# Patient Record
Sex: Male | Born: 1970
Health system: Southern US, Community
[De-identification: ages and names within clinical notes are randomized; demographics above are authoritative.]

## PROBLEM LIST (undated history)

## (undated) DIAGNOSIS — M25561 Pain in right knee: Secondary | ICD-10-CM

## (undated) DIAGNOSIS — Z973 Presence of spectacles and contact lenses: Secondary | ICD-10-CM

## (undated) DIAGNOSIS — R350 Frequency of micturition: Secondary | ICD-10-CM

## (undated) DIAGNOSIS — T7840XA Allergy, unspecified, initial encounter: Secondary | ICD-10-CM

## (undated) DIAGNOSIS — M25569 Pain in unspecified knee: Secondary | ICD-10-CM

## (undated) DIAGNOSIS — F32A Depression, unspecified: Secondary | ICD-10-CM

## (undated) DIAGNOSIS — L503 Dermatographic urticaria: Secondary | ICD-10-CM

## (undated) DIAGNOSIS — M109 Gout, unspecified: Secondary | ICD-10-CM

## (undated) DIAGNOSIS — F439 Reaction to severe stress, unspecified: Secondary | ICD-10-CM

## (undated) DIAGNOSIS — L853 Xerosis cutis: Secondary | ICD-10-CM

## (undated) DIAGNOSIS — F419 Anxiety disorder, unspecified: Secondary | ICD-10-CM

## (undated) DIAGNOSIS — G8929 Other chronic pain: Secondary | ICD-10-CM

## (undated) DIAGNOSIS — R5383 Other fatigue: Secondary | ICD-10-CM

## (undated) DIAGNOSIS — M25562 Pain in left knee: Secondary | ICD-10-CM

## (undated) DIAGNOSIS — R7303 Prediabetes: Secondary | ICD-10-CM

## (undated) DIAGNOSIS — G4733 Obstructive sleep apnea (adult) (pediatric): Secondary | ICD-10-CM

## (undated) DIAGNOSIS — F329 Major depressive disorder, single episode, unspecified: Secondary | ICD-10-CM

## (undated) DIAGNOSIS — R519 Headache, unspecified: Secondary | ICD-10-CM

## (undated) DIAGNOSIS — G4730 Sleep apnea, unspecified: Secondary | ICD-10-CM

## (undated) DIAGNOSIS — E785 Hyperlipidemia, unspecified: Secondary | ICD-10-CM

## (undated) DIAGNOSIS — I10 Essential (primary) hypertension: Secondary | ICD-10-CM

## (undated) DIAGNOSIS — E669 Obesity, unspecified: Secondary | ICD-10-CM

## (undated) DIAGNOSIS — M549 Dorsalgia, unspecified: Secondary | ICD-10-CM

## (undated) DIAGNOSIS — R51 Headache: Secondary | ICD-10-CM

## (undated) DIAGNOSIS — M2550 Pain in unspecified joint: Secondary | ICD-10-CM

## (undated) DIAGNOSIS — I959 Hypotension, unspecified: Secondary | ICD-10-CM

## (undated) HISTORY — DX: Obesity, unspecified: E66.9

## (undated) HISTORY — DX: Pain in unspecified knee: M25.569

## (undated) HISTORY — DX: Prediabetes: R73.03

## (undated) HISTORY — DX: Frequency of micturition: R35.0

## (undated) HISTORY — DX: Other chronic pain: G89.29

## (undated) HISTORY — DX: Pain in left knee: M25.562

## (undated) HISTORY — DX: Sleep apnea, unspecified: G47.30

## (undated) HISTORY — DX: Depression, unspecified: F32.A

## (undated) HISTORY — DX: Allergy, unspecified, initial encounter: T78.40XA

## (undated) HISTORY — DX: Dermatographic urticaria: L50.3

## (undated) HISTORY — DX: Headache, unspecified: R51.9

## (undated) HISTORY — DX: Presence of spectacles and contact lenses: Z97.3

## (undated) HISTORY — DX: Pain in unspecified joint: M25.50

## (undated) HISTORY — DX: Dorsalgia, unspecified: M54.9

## (undated) HISTORY — DX: Headache: R51

## (undated) HISTORY — DX: Xerosis cutis: L85.3

## (undated) HISTORY — DX: Reaction to severe stress, unspecified: F43.9

## (undated) HISTORY — DX: Essential (primary) hypertension: I10

## (undated) HISTORY — DX: Anxiety disorder, unspecified: F41.9

## (undated) HISTORY — DX: Gout, unspecified: M10.9

## (undated) HISTORY — DX: Hyperlipidemia, unspecified: E78.5

## (undated) HISTORY — DX: Other fatigue: R53.83

## (undated) HISTORY — DX: Major depressive disorder, single episode, unspecified: F32.9

## (undated) HISTORY — DX: Pain in right knee: M25.561

---

## 2006-11-12 HISTORY — PX: KNEE SURGERY: SHX244

## 2014-03-16 ENCOUNTER — Encounter: Payer: Self-pay | Admitting: Medical

## 2014-03-16 ENCOUNTER — Ambulatory Visit (INDEPENDENT_AMBULATORY_CARE_PROVIDER_SITE_OTHER): Payer: 59 | Admitting: Medical

## 2014-03-16 VITALS — BP 120/78 | HR 88 | Temp 98.4°F | Resp 15 | Ht 68.5 in | Wt 333.0 lb

## 2014-03-16 DIAGNOSIS — I10 Essential (primary) hypertension: Secondary | ICD-10-CM

## 2014-03-16 DIAGNOSIS — F418 Other specified anxiety disorders: Secondary | ICD-10-CM

## 2014-03-16 DIAGNOSIS — J029 Acute pharyngitis, unspecified: Secondary | ICD-10-CM

## 2014-03-16 DIAGNOSIS — F341 Dysthymic disorder: Secondary | ICD-10-CM

## 2014-03-16 DIAGNOSIS — E669 Obesity, unspecified: Secondary | ICD-10-CM

## 2014-03-16 MED ORDER — FLUOXETINE HCL 20 MG PO CAPS: 20.0000 mg | ORAL_CAPSULE | Freq: Every day | ORAL | Status: DC

## 2014-03-16 MED ORDER — METOPROLOL SUCCINATE ER 50 MG PO TB24: 50.0000 mg | ORAL_TABLET | Freq: Every day | ORAL | Status: DC

## 2014-03-16 NOTE — Progress Notes (Signed)
Subjective: Here as a new patient today.    Went to urgent care about a week ago for sore throat.   Had strep test, provider felt strongly he was +, started him on zpak.  Right side of throat was really swollen, had bad neck pain and ear pain.  Almost went to the ED.  Yesterday was first day he went to work since Thursday.  Currently today is first day he felt much better.  Feels like things are improved in the right direction.    Wants to change primary care providers.  Was primary seeing urgent care prior.  Was advised to get a PCM.   Been in [place] for about a year.   Last PCM 5 years ago.    HTN - compliant with Toprol XL daily.   Diagnosed with HTN around 2007.  Obesity - was up to 450lb at one point.  Has lost a lot of weight over the years.   Gained some wight back with move to [place] and stressful job.  Current weight is 333lb.  Has membership to [employer] but not getting to use it often.   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   Was on fluoxetine in the past for depression and anxiety, it did well in the past.   Had used Wellbutrin in the past.   Would like to go back on fluoxetine.  Tends to stress eat.  Has hard time staying motivated to go to the gym.   No SI/HI.   Objective: BP 120/78  Pulse 88  Temp(Src) 98.4 F (36.9 C) (Oral)  Resp 15  Ht 5' 8.5" (1.74 m)  Wt 333 lb (151.048 kg)  BMI 49.89 kg/m2  General appearance: alert, no distress, WD/WN, obese white male HEENT: normocephalic, sclerae anicteric, TMs pearly, nares patent, no discharge or erythema, pharynx normal Oral cavity: MMM, no lesions Neck: supple, no lymphadenopathy, no thyromegaly, no masses Heart: RRR, normal S1, S2, no murmurs Lungs: CTA bilaterally, no wheezes, rhonchi, or rales Abdomen: +bs, soft, non tender, non distended, no masses, no hepatomegaly, no splenomegaly Pulses: 2+  symmetric, upper and lower extremities, normal cap refill Ext: no edema  Assessment: Encounter Diagnoses  Name Primary?  . Essential hypertension, benign Yes  . Obesity, unspecified   . Depression with anxiety   . Sore throat    Plan: HTN - refilled medication, BP controlled.   Recheck 15mo for physical, fasting.  Obesity - discussed goal of getting to the gym in the mornings daily to start the day.   Try and go every day.  Try to cut calories by at least 500 calories per day.  Work on diet changes as discussed.  recheck 15mo.  Depression - begin back on Fluoxetine.   Discussed his concerns, work stress, ways to deal with stress and anxiety.   discussed risks/benefits of mediation.  recheck 15mo, sooner prn.  Sore throat - mostly resolved, much improved.  C/t supportive care.

## 2014-03-23 ENCOUNTER — Telehealth: Payer: Self-pay | Admitting: Medical

## 2014-03-23 NOTE — Telephone Encounter (Signed)
Yeah, unfortunately, I would need some prior documentation, and we can discussed at next visit.  pls have him sign to get prior eval records, xrays, etc. Regarding the limitations/joint problem

## 2014-03-23 NOTE — Telephone Encounter (Signed)
Pt called wanting to know if Vincenza Hews would be able to write a work note stating that he could continue to wear sneakers at work. PT says he had orthoscopic knee surgery many years ago and has been wearing sneakers at work because he can not tolerate dress shoes. Job requiring a note now. Pt has only seen Vincenza Hews once for an unrelated acute issue and Vincenza Hews does not have his previous records to support the knee issue so pt says he will get previous records sent here regarding his knee issues and then wants to see if Vincenza Hews to write note for work.

## 2014-03-30 NOTE — Telephone Encounter (Signed)
Just notice not on schedule. I will have pt come in to sign documentation.

## 2014-03-30 NOTE — Telephone Encounter (Signed)
Per tammy pt will get records and bring them in.

## 2014-03-30 NOTE — Telephone Encounter (Signed)
Pt is coming in tomorrow for labs. I will have him sign a records release while here.

## 2014-04-04 ENCOUNTER — Telehealth: Payer: Self-pay | Admitting: Medical

## 2014-04-04 NOTE — Telephone Encounter (Signed)
Records received from previous physician. Sending back for review.

## 2014-04-10 NOTE — Telephone Encounter (Signed)
Records was sorted. 

## 2014-04-20 ENCOUNTER — Ambulatory Visit (INDEPENDENT_AMBULATORY_CARE_PROVIDER_SITE_OTHER): Payer: 59 | Admitting: Medical

## 2014-04-20 ENCOUNTER — Encounter: Payer: Self-pay | Admitting: Medical

## 2014-04-20 VITALS — BP 126/86 | HR 88 | Temp 99.0°F | Resp 16 | Ht 69.0 in | Wt 335.0 lb

## 2014-04-20 DIAGNOSIS — M1000 Idiopathic gout, unspecified site: Secondary | ICD-10-CM | POA: Insufficient documentation

## 2014-04-20 DIAGNOSIS — Z0000 Encounter for general adult medical examination without abnormal findings: Secondary | ICD-10-CM

## 2014-04-20 DIAGNOSIS — F32A Depression, unspecified: Secondary | ICD-10-CM | POA: Insufficient documentation

## 2014-04-20 DIAGNOSIS — F419 Anxiety disorder, unspecified: Secondary | ICD-10-CM

## 2014-04-20 DIAGNOSIS — Z23 Encounter for immunization: Secondary | ICD-10-CM

## 2014-04-20 DIAGNOSIS — E669 Obesity, unspecified: Secondary | ICD-10-CM | POA: Insufficient documentation

## 2014-04-20 DIAGNOSIS — I10 Essential (primary) hypertension: Secondary | ICD-10-CM | POA: Insufficient documentation

## 2014-04-20 DIAGNOSIS — F418 Other specified anxiety disorders: Secondary | ICD-10-CM

## 2014-04-20 DIAGNOSIS — F329 Major depressive disorder, single episode, unspecified: Secondary | ICD-10-CM | POA: Insufficient documentation

## 2014-04-20 DIAGNOSIS — M79672 Pain in left foot: Secondary | ICD-10-CM

## 2014-04-20 LAB — COMPREHENSIVE METABOLIC PANEL
ALBUMIN: 4.1 g/dL (ref 3.5–5.2)
ALT: 27 U/L (ref 0–53)
AST: 29 U/L (ref 0–37)
Alkaline Phosphatase: 53 U/L (ref 39–117)
BUN: 17 mg/dL (ref 6–23)
CHLORIDE: 105 meq/L (ref 96–112)
CO2: 27 meq/L (ref 19–32)
CREATININE: 0.67 mg/dL (ref 0.50–1.35)
Calcium: 9 mg/dL (ref 8.4–10.5)
Glucose, Bld: 83 mg/dL (ref 70–99)
POTASSIUM: 4.1 meq/L (ref 3.5–5.3)
Sodium: 139 mEq/L (ref 135–145)
Total Bilirubin: 0.7 mg/dL (ref 0.2–1.2)
Total Protein: 6.6 g/dL (ref 6.0–8.3)

## 2014-04-20 LAB — POCT URINALYSIS DIPSTICK
BILIRUBIN UA: NEGATIVE
Blood, UA: NEGATIVE
Glucose, UA: NEGATIVE
Ketones, UA: NEGATIVE
LEUKOCYTES UA: NEGATIVE
Nitrite, UA: NEGATIVE
Protein, UA: NEGATIVE
SPEC GRAV UA: 1.015
Urobilinogen, UA: NEGATIVE
pH, UA: 7

## 2014-04-20 LAB — LIPID PANEL
CHOL/HDL RATIO: 3.2 ratio
CHOLESTEROL: 130 mg/dL (ref 0–200)
HDL: 41 mg/dL (ref >39)
LDL Cholesterol: 76 mg/dL (ref 0–99)
Triglycerides: 66 mg/dL (ref <150)
VLDL: 13 mg/dL (ref 0–40)

## 2014-04-20 LAB — CBC
HCT: 42.5 % (ref 39.0–52.0)
Hemoglobin: 14.8 g/dL (ref 13.0–17.0)
MCH: 30.3 pg (ref 26.0–34.0)
MCHC: 34.8 g/dL (ref 30.0–36.0)
MCV: 87.1 fL (ref 78.0–100.0)
Platelets: 149 10*3/uL — ABNORMAL LOW (ref 150–400)
RBC: 4.88 MIL/uL (ref 4.22–5.81)
RDW: 14.1 % (ref 11.5–15.5)
WBC: 4.5 10*3/uL (ref 4.0–10.5)

## 2014-04-20 LAB — URIC ACID: URIC ACID, SERUM: 6.7 mg/dL (ref 4.0–7.8)

## 2014-04-20 LAB — TSH: TSH: 2.28 u[IU]/mL (ref 0.350–4.500)

## 2014-04-20 MED ORDER — ALPRAZOLAM 0.5 MG PO TABS: 0.5000 mg | ORAL_TABLET | Freq: Every evening | ORAL | Status: DC | PRN

## 2014-04-20 MED ORDER — METOPROLOL SUCCINATE ER 50 MG PO TB24: 50.0000 mg | ORAL_TABLET | Freq: Every day | ORAL | Status: DC

## 2014-04-20 MED ORDER — FLUOXETINE HCL 40 MG PO CAPS: 40.0000 mg | ORAL_CAPSULE | Freq: Every day | ORAL | Status: DC

## 2014-04-20 NOTE — Addendum Note (Signed)
Addended by: Jac CanavanYSINGER, Nealy Karapetian S on: 04/20/2014 10:46 PM   Modules accepted: Orders, Medications

## 2014-04-20 NOTE — Progress Notes (Signed)
Subjective:   HPI  Patrick Baxter is a 43 y.o. male who presents for a complete physical.   Preventative care:3 years ago Last ophthalmology visit: 2011 in another city Last dental visit: 2011 in another city Last colonoscopy:no Last prostate exam: 3 years Last EKG:2012 [facility] Last labs:fasting today  Prior vaccinations: TD or Tdap within 10 years Influenza:no Pneumococcal:no Shingles/Zostavax:no  Advanced directive:no Health care power of attorney:no Living will:  Concerns: Anxiety and depression - improved since last visit but wants to bump up dose a little on prozac.   Would like refill of prn occasional xanax for worse anxiety.    Obesity - is working on losing weight.  Having some trouble getting motivated.  Having some heel pain, left.    Has questions about HIV prophylactic drug.  Has male sexual preference, uses condoms, not currently sexually active.   Sees counselor through [facility], gets general counseling there.   Reviewed their medical, surgical, family, social, medication, and allergy history and updated chart as appropriate.  Past Medical History  Diagnosis Date  . Hypertension   . Obesity   . Wears glasses   . Depression   . Anxiety   . Bilateral chronic knee pain   . Gout     Past Surgical History  Procedure Laterality Date  . Knee surgery  11/2006    right knee arthroscopy and meniscal repair, Dr. Conni Elliot    History   Social History  . Marital Status: Unknown    Spouse Name: N/A    Number of Children: N/A  . Years of Education: N/A   Occupational History  . Not on file.   Social History Main Topics  . Smoking status: Former Games developer  . Smokeless tobacco: Not on file     Comment: socially prior, not daily smoker  . Alcohol Use: 1.8 oz/week    3 Cans of beer per week  . Drug Use: No  . Sexual Activity: Not on file   Other Topics Concern  . Not on file   Social History Narrative   Single, works for [employer].  Exercise - not  much as of 04/2014.  No significant other.      Family History  Problem Relation Age of Onset  . Alzheimer's disease Mother   . Heart disease Mother     51, angioplasty, CAD  . Cancer Mother     breast  . Stroke Mother   . Hypertension Mother   . Heart disease Father     angina  . Arthritis Father   . Cancer Maternal Grandmother     leukemia  . Diabetes Paternal Grandmother     Current outpatient prescriptions:FLUoxetine (PROZAC) 20 MG capsule, Take 1 capsule (20 mg total) by mouth daily., Disp: 30 capsule, Rfl: 2;  metoprolol succinate (TOPROL-XL) 50 MG 24 hr tablet, Take 1 tablet (50 mg total) by mouth daily. Take with or immediately following a meal., Disp: 30 tablet, Rfl: 2;  ALPRAZolam (XANAX) 0.5 MG tablet, Take 1 tablet (0.5 mg total) by mouth at bedtime as needed for anxiety., Disp: 30 tablet, Rfl: 2  No Known Allergies     Review of Systems Constitutional: -fever, -chills, -sweats, -unexpected weight change, -decreased appetite, -fatigue Allergy: +sneezing, -itching, -congestion Dermatology: -changing moles, +rash, -lumps ENT: -runny nose, -ear pain, -sore throat, -hoarseness, -sinus pain, -teeth pain, - ringing in ears, -hearing loss, -nosebleeds Cardiology: -chest pain, -palpitations, -swelling, -difficulty breathing when lying flat, -waking up short of breath Respiratory: -cough, -shortness  of breath, -difficulty breathing with exercise or exertion, -wheezing, -coughing up blood Gastroenterology: -abdominal pain, -nausea, -vomiting, +diarrhea, -constipation, -blood in stool, -changes in bowel movement, -difficulty swallowing or eating Hematology: -bleeding, -bruising  Musculoskeletal: +joint aches, -muscle aches, +joint swelling, +back pain, -neck pain, -cramping, -changes in gait Ophthalmology: denies vision changes, eye redness, itching, discharge Urology: -burning with urination, -difficulty urinating, -blood in urine, -urinary frequency, -urgency,  -incontinence Neurology: -headache, -weakness, -tingling, -numbness, -memory loss, -falls, -dizziness Psychology: -depressed mood, -agitation, +sleep problems     Objective:   Physical Exam  BP 126/86  Pulse 88  Temp(Src) 99 F (37.2 C) (Oral)  Resp 16  Ht 5\' 9"  (1.753 m)  Wt 335 lb (151.955 kg)  BMI 49.45 kg/m2  General appearance: alert, no distress, WD/WN, obese white male Skin: several amorphic flat yellowish colored patches on back upper and mid back HEENT: normocephalic, conjunctiva/corneas normal, sclerae anicteric, PERRLA, EOMi, nares patent, no discharge or erythema, pharynx normal Oral cavity: MMM, tongue normal, teeth in good repair Neck: supple, no lymphadenopathy, no thyromegaly, no masses, normal ROM, no bruits Chest: non tender, normal shape and expansion Heart: RRR, normal S1, S2, no murmurs Lungs: CTA bilaterally, no wheezes, rhonchi, or rales Abdomen: +bs, soft, non tender, non distended, no masses, no hepatomegaly, no splenomegaly, no bruits Back: non tender, normal ROM, no scoliosis Musculoskeletal: anterior right knee arthroscopic surgical scars, otherwise upper extremities non tender, no obvious deformity, normal ROM throughout, lower extremities non tender, no obvious deformity, normal ROM throughout Extremities: no edema, no cyanosis, no clubbing Pulses: 2+ symmetric, upper and lower extremities, normal cap refill Neurological: alert, oriented x 3, CN2-12 intact, strength normal upper extremities and lower extremities, sensation normal throughout, DTRs 2+ throughout, no cerebellar signs, gait normal Psychiatric: normal affect, behavior normal, pleasant  GU: normal male external genitalia, circumcised, right testes seems to be somewhat retractable, but technically descended, otherwise  nontender, no masses, no hernia, no lymphadenopathy Rectal: deferred   Assessment and Plan :    Encounter Diagnoses  Name Primary?  . Encounter for health maintenance  examination in adult Yes  . Anxiety and depression   . Essential hypertension   . Obesity   . Need for prophylactic vaccination and inoculation against influenza   . Idiopathic gout, unspecified chronicity, unspecified site   . Heel pain, left     Physical exam - discussed healthy lifestyle, diet, exercise, preventative care, vaccinations, and addressed their concerns.    Anxiety and depression-increase to Prozac 40 mg daily, Xanax when necessary, continue counseling  Hypertension-continue same medication  Obesity discussed the need to work on lifestyle changes and weight loss  Counseled on the influenza virus vaccine.  Vaccine information sheet given.  Influenza vaccine given after consent obtained.  Gout - labs today  Heel pain - begin heel cups OTC   Follow-up pending labs

## 2014-04-21 LAB — HEMOGLOBIN A1C
Hgb A1c MFr Bld: 5.1 % (ref <5.7)
Mean Plasma Glucose: 100 mg/dL (ref <117)

## 2014-05-15 ENCOUNTER — Ambulatory Visit (INDEPENDENT_AMBULATORY_CARE_PROVIDER_SITE_OTHER): Payer: 59 | Admitting: Family Medicine

## 2014-05-15 ENCOUNTER — Encounter: Payer: Self-pay | Admitting: Family Medicine

## 2014-05-15 VITALS — BP 142/100 | HR 76 | Temp 98.1°F | Ht 69.0 in | Wt 340.0 lb

## 2014-05-15 DIAGNOSIS — I10 Essential (primary) hypertension: Secondary | ICD-10-CM

## 2014-05-15 DIAGNOSIS — J029 Acute pharyngitis, unspecified: Secondary | ICD-10-CM

## 2014-05-15 DIAGNOSIS — J069 Acute upper respiratory infection, unspecified: Secondary | ICD-10-CM

## 2014-05-15 DIAGNOSIS — B349 Viral infection, unspecified: Secondary | ICD-10-CM

## 2014-05-15 DIAGNOSIS — K120 Recurrent oral aphthae: Secondary | ICD-10-CM

## 2014-05-15 LAB — POCT RAPID STREP A (OFFICE): Rapid Strep A Screen: NEGATIVE

## 2014-05-15 NOTE — Patient Instructions (Signed)
Continue to use tylenol and/or ibuprofren OR Aleve as needed for pain, fevers. Avoid decongestants. Use Coricidin HBP instead (as this will not raise your blood pressure).  Drink plenty of fluids. Use Guaifenesin (found in Mucinex)--either plain, or DM versions if a cough suppressant is also needed. Consider sinus rinses (rinse kit or neti-pot).  Return if ongoing fevers, discolored mucus, worsening cough, shortness of breath, or other new symptoms develop.

## 2014-05-15 NOTE — Progress Notes (Signed)
Chief Complaint  Patient presents with  . Sore Throat    that started Mon/Tues of last week. Started feeling drained by Wed. Started feeling worse by Saturday. Ears are stopped up. Has fever blisters inside bottom lip. Chills and hot flashes that are alternating.    Symptoms began 6-7 days ago with sore throat.  A couple of days later he stayed home from work, and over the past weekend he had increasing congestion, cough, myalgias.  2 days ago, while at work, symptoms progressed--worsening congestion, cough, felt chilled, alternating with fevers (subjective).  Nasal mucus is mostly clear; +PND, which is also clear/white.  Cough is partly dry, and partly productive (white).  He denies any sinus pain/pressure, but some pain in his teeth.  He has pressure in both ears, with plugging and popping, R>L.   He developed painful blisters in his mouth 2-3 days ago.  Denies any known sick contacts. He has already gotten his flu shot this year.  PMH, PSH, SH reviewed.  Outpatient Encounter Prescriptions as of 05/15/2014  Medication Sig Note  . ALPRAZolam (XANAX) 0.5 MG tablet Take 1 tablet (0.5 mg total) by mouth at bedtime as needed for anxiety.   . fexofenadine (ALLEGRA) 180 MG tablet Take 180 mg by mouth daily.   Marland Kitchen FLUoxetine (PROZAC) 40 MG capsule Take 1 capsule (40 mg total) by mouth daily.   . metoprolol succinate (TOPROL-XL) 50 MG 24 hr tablet Take 1 tablet (50 mg total) by mouth daily. Take with or immediately following a meal.   . phenylephrine (SUDAFED PE) 10 MG TABS tablet Take 10 mg by mouth every 4 (four) hours as needed. 05/15/2014: Last dose was yesterday  . Pseudoeph-Doxylamine-DM-APAP (NYQUIL PO) Take 30 mLs by mouth at bedtime.   Marland Kitchen ibuprofen (ADVIL,MOTRIN) 200 MG tablet Take 400 mg by mouth every 6 (six) hours as needed. 05/15/2014: Taking as needed for headaches, generalized achiness (none today)  . naproxen sodium (ANAPROX) 220 MG tablet Take 220 mg by mouth 2 (two) times daily with a  meal. 05/15/2014: yesterday    No Known Allergies  ROS: Denies rashes, nausea, vomiting, diarrhea, urinary complaints. Denies chest pain, palpitations, shortness of breath, wheezing. No bleeding, bruising, rash.  See HPI  PHYSICAL EXAM: BP 142/100 mmHg  Pulse 76  Temp(Src) 98.1 F (36.7 C) (Tympanic)  Ht _0  (1.753 m)  Wt 340 lb (154.223 kg)  BMI 50.19 kg/m2  Well developed, somewhat tired appearing male, with frequent sniffling and dry cough.  He is in no distress. Speaking easily in full sentences HEENT: PERRL, EOMI, conjunctiva and sclera are clear.  TM's and EAC's normal bilaterally, although partly occluded with cerumen on the left. OP:  Mild erythema of anterior tonsillar pillars bilaterally.  No exudate.  Mucus membranes are moist.  Two large ulcers noted on buccal mucosa of lower lip.  No other ulcers or lesions are noted. Neck: no lymphadenopathy or mass Heart: regular rate and rhythm without murmur Lungs: clear bilaterally.  No wheezes, rales, ronchi Skin: no rashes.  Mildly diaphoretic at neck Neuro: alert and oriented.  Cranial nerves, strength and gait are normal.  Rapid strep negative  ASSESSMENT/PLAN:  Viral syndrome  Sore throat - Plan: Rapid Strep A  Aphthous ulcer of mouth  Acute upper respiratory infection  Essential hypertension - elevated today.  Avoid decongestants and monitor periodically   Continue to use tylenol and/or ibuprofren OR Aleve as needed for pain, fevers. Avoid decongestants. Use Coricidin HBP instead (as this will not  raise your blood pressure).  Drink plenty of fluids. Use Guaifenesin (found in Mucinex)--either plain, or DM versions if a cough suppressant is also needed. Consider sinus rinses (rinse kit or neti-pot).  Return if ongoing fevers, discolored mucus, worsening cough, shortness of breath, or other new symptoms develop.  Consider tessalon if OTC's are ineffective in helping with the dry cough.

## 2014-05-17 ENCOUNTER — Telehealth: Payer: Self-pay | Admitting: Family Medicine

## 2014-05-17 MED ORDER — AZITHROMYCIN 250 MG PO TABS: ORAL_TABLET | ORAL | Status: DC

## 2014-05-17 NOTE — Telephone Encounter (Signed)
done

## 2014-05-17 NOTE — Telephone Encounter (Signed)
Pt called and states he is actually getting worse and would like antibiotic called into [facility] [address].  Pt ph [phone]

## 2014-07-24 ENCOUNTER — Telehealth: Payer: Self-pay | Admitting: Internal Medicine

## 2014-07-24 NOTE — Telephone Encounter (Signed)
Patient is aware and he has his appointment for 08/10/14 for a physical

## 2014-07-24 NOTE — Telephone Encounter (Signed)
Due back for recheck on some of his medications, so make f/u on med check and let me re-examine his feet at that time.  I don't think we did any xrays of heels at that time .  We discussed his foot pains on a physical visit.

## 2014-07-24 NOTE — Telephone Encounter (Signed)
Pt is still having trouble with his heel pain and would like to get referred somewhere for this.

## 2014-08-02 ENCOUNTER — Encounter: Payer: Self-pay | Admitting: Medical

## 2014-08-02 ENCOUNTER — Telehealth: Payer: Self-pay | Admitting: Medical

## 2014-08-02 ENCOUNTER — Ambulatory Visit (INDEPENDENT_AMBULATORY_CARE_PROVIDER_SITE_OTHER): Payer: 59 | Admitting: Medical

## 2014-08-02 VITALS — BP 130/80 | HR 76 | Temp 98.0°F | Resp 16 | Wt 356.0 lb

## 2014-08-02 DIAGNOSIS — F32A Depression, unspecified: Secondary | ICD-10-CM

## 2014-08-02 DIAGNOSIS — I10 Essential (primary) hypertension: Secondary | ICD-10-CM

## 2014-08-02 DIAGNOSIS — E669 Obesity, unspecified: Secondary | ICD-10-CM

## 2014-08-02 DIAGNOSIS — F329 Major depressive disorder, single episode, unspecified: Secondary | ICD-10-CM

## 2014-08-02 DIAGNOSIS — F419 Anxiety disorder, unspecified: Principal | ICD-10-CM

## 2014-08-02 DIAGNOSIS — F418 Other specified anxiety disorders: Secondary | ICD-10-CM

## 2014-08-02 DIAGNOSIS — M79672 Pain in left foot: Secondary | ICD-10-CM

## 2014-08-02 MED ORDER — METOPROLOL SUCCINATE ER 50 MG PO TB24: 50.0000 mg | ORAL_TABLET | Freq: Every day | ORAL | Status: DC

## 2014-08-02 MED ORDER — FLUOXETINE HCL 40 MG PO CAPS: 40.0000 mg | ORAL_CAPSULE | Freq: Every day | ORAL | Status: DC

## 2014-08-02 NOTE — Telephone Encounter (Signed)
Referral to podiatry  

## 2014-08-02 NOTE — Progress Notes (Signed)
Subjective: Here for follow-up on medications  Hypertension-taking Toprol-XL 50 mg daily.  Doesn't check BPs.  No problems with the medication.   BP Readings from Last 3 Encounters:  08/02/14 130/80  05/15/14 142/100  04/20/14 126/86   Obesity-heel pain limits exercise.   He is not exercising much.   Stress overeats.  Has not been on weight loss medication prior.  Doesn't like the thought of taking medication for this. Wt Readings from Last 3 Encounters:  08/02/14 356 lb (161.481 kg)  05/15/14 340 lb (154.223 kg)  04/20/14 335 lb (151.955 kg)   Depression and anxiety-taking Prozac 40 mg daily, Xanax - takes rarely.  Counseling - none.   In general, mood ok.  Feels like prozac helps.   Still under a lot of stress, stress eating + which is one of his coping mechanisms.   Doing ok on the job currently.  But still has poor interaction with supervisor, not getting much help from regional supervisor in dealings with immediate supervisor.  Can't seem to get motivated to go to the gym.   Heel pain-still having same pains, actually worse.     Past Medical History  Diagnosis Date  . Hypertension   . Obesity   . Wears glasses   . Depression   . Anxiety   . Bilateral chronic knee pain   . Gout   . Dermatographism    Objective: BP 130/80 mmHg  Pulse 76  Temp(Src) 98 F (36.7 C) (Oral)  Resp 16  Wt 356 lb (161.481 kg)  General appearance: alert, no distress, WD/WN, obese white male Neck: supple, no lymphadenopathy, no thyromegaly, no masses Heart: RRR, normal S1, S2, no murmurs Lungs: CTA bilaterally, no wheezes, rhonchi, or rales Pulses: 2+ symmetric, upper and lower extremities, normal cap refill Ext: no edema Feet: tender over left heel, pain with weight bearing, somewhat flat arch, left toenail without no erythema or swelling at adjacent nail beds, otherwise nontender, normal ROM, no deformity otherwise   Assessment: Encounter Diagnoses  Name Primary?  Marland Kitchen. Anxiety and depression  Yes  . Obesity   . Essential hypertension   . Heel pain, left    Plan: Anxiety depression-c/t Prozac 40mg  daily, rarely uses Xanax.   Consider counseling.  Gave list of counselors.  Obesity-need to make significant lifestyle changes.  He declines medication, surgery.   He will work to get in to the gym.  Pays for [employer] for [employer] but not going.   Strongly encouraged him to make this a priority.  HTN - c/t Toprol 50mg  XL daily  Heel pain - discussed options for next steps.  Given options, he wants to see foot specialist.  referral to podiatry

## 2014-08-02 NOTE — Patient Instructions (Signed)
Counseling services   [facility] [phone] office [email or web address] [address], [place], [place] [address]  Laura Atkinson, therapist  Erik Nelson, MA, clinical psychologist  Cognitive-Behavior Therapy; Mood Disorders; Anxiety Disorders; adult and child ADHD; Family Therapy; Stress Management; personal growth, and Marital Therapy.    Dennis McKnight Ph.D., clinical psychologist Cognitive-Behavior Therapy; Mood Disorders; Anxiety Disorders; Stress     Management   [facility] [address], [place], [place] [address] [phone]   [facility] Desiree Wilkinson, psychotherapist [address] [place], [place] [address] [phone]   Elaine Talbert Ph.D., clinical psychologist [phone] office [address] [place],  [address] Cognitive Behavior Therapy, Depression, Bipolar, Anxiety, Grief and Loss   

## 2014-08-02 NOTE — Telephone Encounter (Signed)
I fax over the patients information to [facility] to Fax # [phone] and they will contact the patient for his appointment.

## 2014-08-10 ENCOUNTER — Encounter: Payer: Self-pay | Admitting: Medical

## 2014-09-19 ENCOUNTER — Ambulatory Visit (INDEPENDENT_AMBULATORY_CARE_PROVIDER_SITE_OTHER): Payer: 59 | Admitting: Medical

## 2014-09-19 ENCOUNTER — Encounter: Payer: Self-pay | Admitting: Medical

## 2014-09-19 VITALS — BP 122/80 | HR 88 | Temp 98.2°F | Resp 14 | Wt 357.0 lb

## 2014-09-19 DIAGNOSIS — I10 Essential (primary) hypertension: Secondary | ICD-10-CM | POA: Diagnosis not present

## 2014-09-19 DIAGNOSIS — F418 Other specified anxiety disorders: Secondary | ICD-10-CM

## 2014-09-19 DIAGNOSIS — K121 Other forms of stomatitis: Secondary | ICD-10-CM | POA: Diagnosis not present

## 2014-09-19 DIAGNOSIS — R509 Fever, unspecified: Secondary | ICD-10-CM | POA: Diagnosis not present

## 2014-09-19 DIAGNOSIS — F419 Anxiety disorder, unspecified: Secondary | ICD-10-CM

## 2014-09-19 DIAGNOSIS — R52 Pain, unspecified: Secondary | ICD-10-CM | POA: Diagnosis not present

## 2014-09-19 DIAGNOSIS — F329 Major depressive disorder, single episode, unspecified: Secondary | ICD-10-CM

## 2014-09-19 DIAGNOSIS — F32A Depression, unspecified: Secondary | ICD-10-CM

## 2014-09-19 LAB — POC INFLUENZA A&B (BINAX/QUICKVUE)
INFLUENZA B, POC: NEGATIVE
Influenza A, POC: NEGATIVE

## 2014-09-19 LAB — CBC WITH DIFFERENTIAL/PLATELET
Basophils Absolute: 0 10*3/uL (ref 0.0–0.1)
Basophils Relative: 0 % (ref 0–1)
EOS ABS: 0.2 10*3/uL (ref 0.0–0.7)
Eosinophils Relative: 2 % (ref 0–5)
HEMATOCRIT: 42.6 % (ref 39.0–52.0)
Hemoglobin: 14.2 g/dL (ref 13.0–17.0)
LYMPHS ABS: 0.7 10*3/uL (ref 0.7–4.0)
LYMPHS PCT: 9 % — AB (ref 12–46)
MCH: 29.5 pg (ref 26.0–34.0)
MCHC: 33.3 g/dL (ref 30.0–36.0)
MCV: 88.4 fL (ref 78.0–100.0)
MONO ABS: 0.7 10*3/uL (ref 0.1–1.0)
MPV: 9.5 fL (ref 8.6–12.4)
Monocytes Relative: 9 % (ref 3–12)
Neutro Abs: 6.4 10*3/uL (ref 1.7–7.7)
Neutrophils Relative %: 80 % — ABNORMAL HIGH (ref 43–77)
Platelets: 150 10*3/uL (ref 150–400)
RBC: 4.82 MIL/uL (ref 4.22–5.81)
RDW: 13.9 % (ref 11.5–15.5)
WBC: 8 10*3/uL (ref 4.0–10.5)

## 2014-09-19 LAB — POCT RAPID STREP A (OFFICE): RAPID STREP A SCREEN: NEGATIVE

## 2014-09-19 MED ORDER — FLUOXETINE HCL 40 MG PO CAPS: 40.0000 mg | ORAL_CAPSULE | Freq: Every day | ORAL | Status: DC

## 2014-09-19 MED ORDER — TRIAMCINOLONE ACETONIDE 0.1 % MT PSTE: 1.0000 "application " | PASTE | Freq: Two times a day (BID) | OROMUCOSAL | Status: DC

## 2014-09-19 NOTE — Progress Notes (Signed)
Subjective:  Patrick Baxter is a 44 y.o. male who presents for illness.  Here off and on symptoms since 08/31/14.  Started with uncontrollable diarrhea, but later in the week of 08/31/14 improved.  Had few days of vomiting. Had few days of feeling fine.   Then worked the next 1.5 week feeling ok, then got sick feeling again.  A week ago had flu like symptoms including chills, fever, body aches.  He notes this week developed a fever blister on back of tongue, painful, sneezing, tickle in throat, sore throat, chills, some body aches, some fever.  Denies cough, ear pain, sinus pressure.   No current NVD.  Treatment to date: hot soaks, aleve, cough drops.  +sick contacts with cold symptoms.  No other aggravating or relieving factors.    Needs refills on his other medications, last visit for both was 1/ 2016.  No c/o, doing fine on those medications.  No other c/o.  ROS as in subjective.   Objective: Filed Vitals:   09/19/14 0855  BP: 122/80  Pulse: 88  Temp: 98.2 F (36.8 C)  Resp: 14   Wt Readings from Last 3 Encounters:  09/19/14 357 lb (161.934 kg)  08/02/14 356 lb (161.481 kg)  05/15/14 340 lb (154.223 kg)   BP Readings from Last 3 Encounters:  09/19/14 122/80  08/02/14 130/80  05/15/14 142/100   General appearance: Alert, WD/WN, no distress, mildly ill appearing                             Skin: warm, no rash                           Head: no sinus tenderness                            Eyes: conjunctiva normal, corneas clear, PERRLA                            Ears: pearly TMs, external ear canals normal                          Nose: septum midline, turbinates swollen, with erythema and clear discharge             Mouth/throat: MMM, ulceration of left volar tongue and central upper gum, tongue normal, mild pharyngeal erythema                           Neck: supple, no adenopathy, no thyromegaly, nontender                          Heart: RRR, normal S1, S2, no murmurs           Lungs: CTA bilaterally, no wheezes, rales, or rhonchi     Assessment: Encounter Diagnoses  Name Primary?  . Fever and chills Yes  . Body aches   . Anxiety and depression   . Essential hypertension   . Oral ulcer     Plan: Fever and chills, body aches, viral URI, - flu and strep negative.  CBC today. suggested symptomatic OTC remedies for supportive care. Nasal saline spray for congestion.  Tylenol or Ibuprofen OTC for fever and malaise.  Call/return in  2-3 days if symptoms aren't resolving.   Oral ulcer - begin kenalog in oragel, salt water gargles  Anxiety and depression - doing fine on current medication  HTN - c/t same medication

## 2014-09-19 NOTE — Addendum Note (Signed)
Addended by: Janeice RobinsonSCALES, Ricardo Kayes L on: 09/19/2014 09:48 AM   Modules accepted: Orders

## 2014-09-20 ENCOUNTER — Other Ambulatory Visit: Payer: Self-pay | Admitting: Medical

## 2014-09-20 MED ORDER — AMOXICILLIN 875 MG PO TABS: 875.0000 mg | ORAL_TABLET | Freq: Two times a day (BID) | ORAL | Status: DC

## 2014-09-21 ENCOUNTER — Telehealth: Payer: Self-pay

## 2014-09-21 NOTE — Telephone Encounter (Signed)
I would at least finish the antibiotic and give this time to clear up.  In general for allergies make sure he is taking his Allegra, and if having that much problem, have him begin nasal spray for drainage and allergies too.   He can try Dymista samples I have here, 1 spray per nostril BID.

## 2014-09-21 NOTE — Telephone Encounter (Signed)
Patrick HewsShane, patient is continuing to have issues with his sore throat. He would like referral to ENT? Please advise

## 2014-09-22 NOTE — Telephone Encounter (Signed)
Patient is aware of Patrick CoveyShane Tysinger PA message and recommendations. I put a sample of Nasonex up front for the patient to pick up.

## 2014-09-22 NOTE — Telephone Encounter (Signed)
LMOM TO CB. CLS 

## 2014-09-28 ENCOUNTER — Telehealth: Payer: Self-pay

## 2014-09-28 ENCOUNTER — Ambulatory Visit
Admission: RE | Admit: 2014-09-28 | Discharge: 2014-09-28 | Disposition: A | Discharge status: Home / Self Care | Payer: 59 | Source: Ambulatory Visit | Attending: Medical | Admitting: Medical

## 2014-09-28 ENCOUNTER — Other Ambulatory Visit: Payer: Self-pay

## 2014-09-28 DIAGNOSIS — R059 Cough, unspecified: Secondary | ICD-10-CM

## 2014-09-28 DIAGNOSIS — R05 Cough: Secondary | ICD-10-CM

## 2014-09-28 NOTE — Telephone Encounter (Signed)
Patient is not better. Has a couple of more antibiotic pills left. Has noted bloody mucous with cough and sore throat, generally still does not feel well at all..should he come in today or tomorrow?

## 2014-09-28 NOTE — Progress Notes (Signed)
LM to CB [facility] 

## 2014-09-28 NOTE — Telephone Encounter (Signed)
Spoke with patient, he will go for chest xray to [facility]   today and call back tomorrow for results if he hasnt heard from us today. His work number is [phone].

## 2014-09-28 NOTE — Telephone Encounter (Signed)
If he feels like its in his chest too, then send for CXR first.  If not, recheck so I can examine

## 2014-09-28 NOTE — Progress Notes (Signed)
LM to CB

## 2014-10-04 ENCOUNTER — Telehealth: Payer: Self-pay | Admitting: Family Medicine

## 2014-10-04 NOTE — Telephone Encounter (Signed)
Often that symptom is related to GERD or  Post nasal drainage, so I would still start with the current referral plan

## 2014-10-04 NOTE — Telephone Encounter (Signed)
lmom to cb. CLS 

## 2014-10-04 NOTE — Telephone Encounter (Signed)
Patient called and said that he is having another issue and he is not sure if it is related to what he has already but every time he swallows it feels like something is in his throat like a knot. It feels like something is stuck in his throat. Please, advise on what he should do? I fax over the referral to see the allergists on Friday but not appointment yet.

## 2014-10-04 NOTE — Telephone Encounter (Signed)
[facility] @ 930 am.

## 2014-10-10 NOTE — Telephone Encounter (Signed)
I left the patient a voicemail about his appointment at the [facility]. [address] [place], [place] [phone]  10/17/2014 @ 930 AM

## 2014-12-29 ENCOUNTER — Ambulatory Visit (INDEPENDENT_AMBULATORY_CARE_PROVIDER_SITE_OTHER): Payer: 59 | Admitting: Medical

## 2014-12-29 ENCOUNTER — Encounter: Payer: Self-pay | Admitting: Medical

## 2014-12-29 VITALS — BP 132/80 | HR 76 | Temp 98.1°F | Resp 15 | Wt 389.0 lb

## 2014-12-29 DIAGNOSIS — F329 Major depressive disorder, single episode, unspecified: Secondary | ICD-10-CM

## 2014-12-29 DIAGNOSIS — F32A Depression, unspecified: Secondary | ICD-10-CM

## 2014-12-29 DIAGNOSIS — F419 Anxiety disorder, unspecified: Secondary | ICD-10-CM

## 2014-12-29 DIAGNOSIS — I10 Essential (primary) hypertension: Secondary | ICD-10-CM

## 2014-12-29 DIAGNOSIS — S161XXA Strain of muscle, fascia and tendon at neck level, initial encounter: Secondary | ICD-10-CM | POA: Diagnosis not present

## 2014-12-29 DIAGNOSIS — F418 Other specified anxiety disorders: Secondary | ICD-10-CM

## 2014-12-29 DIAGNOSIS — J309 Allergic rhinitis, unspecified: Secondary | ICD-10-CM | POA: Diagnosis not present

## 2014-12-29 DIAGNOSIS — S29012A Strain of muscle and tendon of back wall of thorax, initial encounter: Secondary | ICD-10-CM

## 2014-12-29 MED ORDER — CYCLOBENZAPRINE HCL 10 MG PO TABS: ORAL_TABLET | ORAL | Status: DC

## 2014-12-29 MED ORDER — METOPROLOL SUCCINATE ER 50 MG PO TB24: 50.0000 mg | ORAL_TABLET | Freq: Every day | ORAL | Status: DC

## 2014-12-29 MED ORDER — FLUOXETINE HCL 40 MG PO CAPS: 40.0000 mg | ORAL_CAPSULE | Freq: Every day | ORAL | Status: DC

## 2014-12-29 MED ORDER — MONTELUKAST SODIUM 10 MG PO TABS: 10.0000 mg | ORAL_TABLET | Freq: Every day | ORAL | Status: DC

## 2014-12-29 NOTE — Progress Notes (Signed)
Subjective: Here for pulled muscle in right shoulder.  Certain movements really hurt.   He notes no specific injury, but between watering plants in the garden and lifting books at [employer] where he works, not sure if he pulled something with this activity.  Pain started this morning.  Right shoulder hurts to lift some, neck turning hurts.  Getting up from seated position not comfortable.   No other aggravating or relieving factors.   Went to podiatry for foot issue, heel spur and neuroma in right foot.  Was given a pain pill.  Wants refill on this if possible, was a special coated pill  Doing fine on current antidepressant, doing well.  Needs refill.   Mood has been good.  Just moved to new apartment.  Had allergy testing a while back and had delayed reaction to mold.   Allergies advised he stay on Singulair, needs refill on this.    HTN - compliant with medication without c/o.  Past Medical History  Diagnosis Date  . Hypertension   . Obesity   . Wears glasses   . Depression   . Anxiety   . Bilateral chronic knee pain   . Gout   . Dermatographism     ROS as in subjective   Objective: BP 132/80 mmHg  Pulse 76  Temp(Src) 98.1 F (36.7 C) (Oral)  Resp 15  Wt 389 lb (176.449 kg)  Gen: wd, wn,nad Skin: unremarkable, no erythema or bruising MSK: mild tenderness or right posterior shoulder/deltoid, otherwise shoulder and arm nontender, no swelling, normal ROM, no laxity Neck: stiff and +spasm, right rotation and flexion reduced with pain, otherwise no mass, no lymphadenopathy Back: tender right back paraspinal in about T8-T10 region, otherwise nontender, but pain with getting out of chair Arms neurovascularly intact    Assessment: Encounter Diagnoses  Name Primary?  . Neck strain, initial encounter Yes  . Strain of mid-back, initial encounter   . Anxiety and depression   . Essential hypertension   . Allergic rhinitis, unspecified allergic rhinitis type     Plan Neck  strain, strain of back - discussed symptoms, treatment, recommendations, begin Flexeril prn, OTC   Anxiety and depression - doing fine on Fluoxetine  HTN - c/t same medication  Allergic rhinitis - c/t Singulair  Refills sent  Patrick Baxter was seen today for right shoulder pain and medication refill.  Diagnoses and all orders for this visit:  Neck strain, initial encounter  Strain of mid-back, initial encounter  Anxiety and depression Orders: -     FLUoxetine (PROZAC) 40 MG capsule; Take 1 capsule (40 mg total) by mouth daily.  Essential hypertension  Allergic rhinitis, unspecified allergic rhinitis type  Other orders -     cyclobenzaprine (FLEXERIL) 10 MG tablet; 1 tablet at QHS prn or 1/2-1 tablet during the day prn -     metoprolol succinate (TOPROL-XL) 50 MG 24 hr tablet; Take 1 tablet (50 mg total) by mouth daily. Take with or immediately following a meal. -     montelukast (SINGULAIR) 10 MG tablet; Take 1 tablet (10 mg total) by mouth at bedtime.

## 2015-01-12 ENCOUNTER — Other Ambulatory Visit: Payer: Self-pay | Admitting: Medical

## 2015-05-28 ENCOUNTER — Encounter: Payer: Self-pay | Admitting: Family Medicine

## 2015-05-28 ENCOUNTER — Ambulatory Visit (INDEPENDENT_AMBULATORY_CARE_PROVIDER_SITE_OTHER): Payer: Commercial Managed Care - HMO | Admitting: Family Medicine

## 2015-05-28 VITALS — BP 140/90 | HR 64 | Temp 98.5°F | Wt 399.0 lb

## 2015-05-28 DIAGNOSIS — R110 Nausea: Secondary | ICD-10-CM | POA: Diagnosis not present

## 2015-05-28 DIAGNOSIS — R197 Diarrhea, unspecified: Secondary | ICD-10-CM

## 2015-05-28 LAB — CBC WITH DIFFERENTIAL/PLATELET
BASOS ABS: 0.1 10*3/uL (ref 0.0–0.1)
Basophils Relative: 1 % (ref 0–1)
Eosinophils Absolute: 0.1 10*3/uL (ref 0.0–0.7)
Eosinophils Relative: 2 % (ref 0–5)
HCT: 43.4 % (ref 39.0–52.0)
Hemoglobin: 15.3 g/dL (ref 13.0–17.0)
LYMPHS ABS: 1.2 10*3/uL (ref 0.7–4.0)
LYMPHS PCT: 20 % (ref 12–46)
MCH: 30.4 pg (ref 26.0–34.0)
MCHC: 35.3 g/dL (ref 30.0–36.0)
MCV: 86.1 fL (ref 78.0–100.0)
MPV: 9.4 fL (ref 8.6–12.4)
Monocytes Absolute: 0.8 10*3/uL (ref 0.1–1.0)
Monocytes Relative: 13 % — ABNORMAL HIGH (ref 3–12)
NEUTROS ABS: 3.9 10*3/uL (ref 1.7–7.7)
Neutrophils Relative %: 64 % (ref 43–77)
Platelets: 170 10*3/uL (ref 150–400)
RBC: 5.04 MIL/uL (ref 4.22–5.81)
RDW: 14.1 % (ref 11.5–15.5)
WBC: 6.1 10*3/uL (ref 4.0–10.5)

## 2015-05-28 LAB — COMPREHENSIVE METABOLIC PANEL
ALBUMIN: 3.9 g/dL (ref 3.6–5.1)
ALK PHOS: 82 U/L (ref 40–115)
ALT: 38 U/L (ref 9–46)
AST: 32 U/L (ref 10–40)
BILIRUBIN TOTAL: 0.5 mg/dL (ref 0.2–1.2)
BUN: 9 mg/dL (ref 7–25)
CALCIUM: 9.1 mg/dL (ref 8.6–10.3)
CO2: 26 mmol/L (ref 20–31)
Chloride: 103 mmol/L (ref 98–110)
Creat: 0.9 mg/dL (ref 0.60–1.35)
GLUCOSE: 70 mg/dL (ref 65–99)
Potassium: 4.3 mmol/L (ref 3.5–5.3)
Sodium: 139 mmol/L (ref 135–146)
TOTAL PROTEIN: 6.6 g/dL (ref 6.1–8.1)

## 2015-05-28 MED ORDER — ONDANSETRON HCL 4 MG PO TABS: 4.0000 mg | ORAL_TABLET | Freq: Three times a day (TID) | ORAL | Status: DC | PRN

## 2015-05-28 NOTE — Progress Notes (Signed)
   Subjective:    Patient ID: Patrick Baxter, male    DOB: February 05, 1971, 44 y.o.   MRN: [id number]  HPI He is here for 10 days of GI symptoms that started with chills, body aches and then developed "rumbling" in stomach and diarrhea about a week ago with occasional mild nausea. Tried bland diet and drinking plenty of fluids.  [facility] and was prescribed zofran for nausea and was told to stop immodium. He reports a low grade fever at that time.  He has been on [employer]. He states symptoms cleared up 4 days ago and had normal stool 3-4 days ago. He states he has avoided dairy for past week to see if this would help. Reports 2 nights ago that watery diarrhea returned. Yellowish watery diarrhea. Still does not have abdominal pain.  Last episode of diarrhea 3am this morning. Continues to feel rumbling in stomach.  Denies vomiting, abdominal or back pain. Denies blood or pus in stool. No recent antibiotic use, possible unusual food- ?fruit pastry? and his friend also had similar symptoms. No recent travel.    Reviewed allergies, medications, past medical, surgical, and social history.  Review of Systems Pertinent positives and negatives in the history of present illness.    Objective:   Physical Exam  Constitutional: He is oriented to person, place, and time. He appears well-developed and well-nourished. No distress.  HENT:  Mouth/Throat: Uvula is midline, oropharynx is clear and moist and mucous membranes are normal.  Cardiovascular: Normal rate and regular rhythm.   Pulmonary/Chest: Effort normal and breath sounds normal.  Abdominal: Soft. Normal appearance and bowel sounds are normal. There is no tenderness. There is no rigidity, no rebound, no guarding, no CVA tenderness, no tenderness at McBurney's point and negative Murphy's sign.  Lymphadenopathy:    He has no cervical adenopathy.    He has no axillary adenopathy.       Right: No supraclavicular adenopathy  present.       Left: No supraclavicular adenopathy present.  Neurological: He is alert and oriented to person, place, and time. He has normal strength. Gait normal.  Skin: Skin is warm and dry. No rash noted. No cyanosis. No pallor. Nails show no clubbing.  Psychiatric: He has a normal mood and affect. His speech is normal and behavior is normal. Thought content normal.        Assessment & Plan:  Diarrhea, unspecified type - Plan: CBC with Differential/Platelet, Comprehensive metabolic panel  Nausea without vomiting - Plan: ondansetron (ZOFRAN) 4 MG tablet  Recommend that he continue to avoid dairy and spicy foods. Written instructions provided on diet choices to help with diarrhea. Discussed staying well hydrated. Zofran prescribed. He may use as needed for nausea. Also discussed that he can use Imodium for diarrhea. Written prescription sent (culture, O & P, and C-diff) along with stool specimen kit to collect stool sample.  Discussed red flags such as high fever, uncontrollable vomiting, or abdominal pain. He will provide stool specimen and we will treat appropriately. Will follow up pending labs as well.  Spent a minimum of 25 minutes with patient face-to-face, 50% of that was in counseling and coordination of care.

## 2015-05-28 NOTE — Patient Instructions (Signed)
You can take immodium as needed. Stay hydrated and let us know if you develop worsening symptoms such as fever, vomiting, abdominal pain.   Food Choices to Help Relieve Diarrhea, Adult When you have diarrhea, the foods you eat and your eating habits are very important. Choosing the right foods and drinks can help relieve diarrhea. Also, because diarrhea can last up to 7 days, you need to replace lost fluids and electrolytes (such as sodium, potassium, and chloride) in order to help prevent dehydration.  WHAT GENERAL GUIDELINES DO I NEED TO FOLLOW?  Slowly drink 1 cup (8 oz) of fluid for each episode of diarrhea. If you are getting enough fluid, your urine will be clear or pale yellow.  Eat starchy foods. Some good choices include white rice, white toast, pasta, low-fiber cereal, baked potatoes (without the skin), saltine crackers, and bagels.  Avoid large servings of any cooked vegetables.  Limit fruit to two servings per day. A serving is  cup or 1 small piece.  Choose foods with less than 2 g of fiber per serving.  Limit fats to less than 8 tsp (38 g) per day.  Avoid fried foods.  Eat foods that have probiotics in them. Probiotics can be found in certain dairy products.  Avoid foods and beverages that may increase the speed at which food moves through the stomach and intestines (gastrointestinal tract). Things to avoid include:  High-fiber foods, such as dried fruit, raw fruits and vegetables, nuts, seeds, and whole grain foods.  Spicy foods and high-fat foods.  Foods and beverages sweetened with high-fructose corn syrup, honey, or sugar alcohols such as xylitol, sorbitol, and mannitol. WHAT FOODS ARE RECOMMENDED? Grains White rice. White, [place], or pita breads (fresh or toasted), including plain rolls, buns, or bagels. White pasta. Saltine, soda, or graham crackers. Pretzels. Low-fiber cereal. Cooked cereals made with water (such as cornmeal, farina, or cream cereals). Plain  muffins. Matzo. Melba toast. Zwieback.  Vegetables Potatoes (without the skin). Strained tomato and vegetable juices. Most well-cooked and canned vegetables without seeds. Tender lettuce. Fruits Cooked or canned applesauce, apricots, cherries, fruit cocktail, grapefruit, peaches, pears, or plums. Fresh bananas, apples without skin, cherries, grapes, cantaloupe, grapefruit, peaches, oranges, or plums.  Meat and Other Protein Products Baked or boiled chicken. Eggs. Tofu. Fish. Seafood. Smooth peanut butter. Ground or well-cooked tender beef, ham, veal, lamb, pork, or poultry.  Dairy Plain yogurt, kefir, and unsweetened liquid yogurt. Lactose-free milk, buttermilk, or soy milk. Plain hard cheese. Beverages Sport drinks. Clear broths. Diluted fruit juices (except prune). Regular, caffeine-free sodas such as ginger ale. Water. Decaffeinated teas. Oral rehydration solutions. Sugar-free beverages not sweetened with sugar alcohols. Other Bouillon, broth, or soups made from recommended foods.  The items listed above may not be a complete list of recommended foods or beverages. Contact your dietitian for more options. WHAT FOODS ARE NOT RECOMMENDED? Grains Whole grain, whole wheat, bran, or rye breads, rolls, pastas, crackers, and cereals. Wild or brown rice. Cereals that contain more than 2 g of fiber per serving. Corn tortillas or taco shells. Cooked or dry oatmeal. Granola. Popcorn. Vegetables Raw vegetables. Cabbage, broccoli, Brussels sprouts, artichokes, baked beans, beet greens, corn, kale, legumes, peas, sweet potatoes, and yams. Potato skins. Cooked spinach and cabbage. Fruits Dried fruit, including raisins and dates. Raw fruits. Stewed or dried prunes. Fresh apples with skin, apricots, mangoes, pears, raspberries, and strawberries.  Meat and Other Protein Products Chunky peanut butter. Nuts and seeds. Beans and lentils. Tomasa BlaseBacon.  Dairy High-fat  cheeses. Milk, chocolate milk, and beverages made  with milk, such as milk shakes. Cream. Ice cream. Sweets and Desserts Sweet rolls, doughnuts, and sweet breads. Pancakes and waffles. Fats and Oils Butter. Cream sauces. Margarine. Salad oils. Plain salad dressings. Olives. Avocados.  Beverages Caffeinated beverages (such as coffee, tea, soda, or energy drinks). Alcoholic beverages. Fruit juices with pulp. Prune juice. Soft drinks sweetened with high-fructose corn syrup or sugar alcohols. Other Coconut. Hot sauce. Chili powder. Mayonnaise. Gravy. Cream-based or milk-based soups.  The items listed above may not be a complete list of foods and beverages to avoid. Contact your dietitian for more information. WHAT SHOULD I DO IF I BECOME DEHYDRATED? Diarrhea can sometimes lead to dehydration. Signs of dehydration include dark urine and dry mouth and skin. If you think you are dehydrated, you should rehydrate with an oral rehydration solution. These solutions can be purchased at pharmacies, retail stores, or online.  Drink -1 cup (120-240 mL) of oral rehydration solution each time you have an episode of diarrhea. If drinking this amount makes your diarrhea worse, try drinking smaller amounts more often. For example, drink 1-3 tsp (5-15 mL) every 5-10 minutes.  A general rule for staying hydrated is to drink 1-2 L of fluid per day. Talk to your health care provider about the specific amount you should be drinking each day. Drink enough fluids to keep your urine clear or pale yellow.   This information is not intended to replace advice given to you by your health care provider. Make sure you discuss any questions you have with your health care provider.   Document Released: 09/20/2003 Document Revised: 07/21/2014 Document Reviewed: 05/23/2013 Elsevier Interactive Patient Education [employer]

## 2015-05-29 ENCOUNTER — Telehealth: Payer: Self-pay | Admitting: Medical

## 2015-05-29 ENCOUNTER — Other Ambulatory Visit: Payer: Self-pay | Admitting: Family Medicine

## 2015-05-29 DIAGNOSIS — A071 Giardiasis [lambliasis]: Secondary | ICD-10-CM

## 2015-05-29 NOTE — Telephone Encounter (Signed)
Pt called stating that he has taken a turn for the worse again today. He had a regular bowel movement yesterday and was eating a little yesterday but now all the symptoms are back again. He has nausea, diarrhea, weak, lightheaded.

## 2015-05-29 NOTE — Telephone Encounter (Signed)
Please call and see if he is noticing any blood or pus in his stool or having any fever or abdominal pain. He was not experiencing any of these symptoms yesterday. If he feels like he is getting severely dehydrated then he may need to go the emergency department to get IV fluids. If he feels like he is able to drink enough fluids then I would just give this more time and see what his stool samples show. I ran this by Vincenza HewsShane, his PCP also and he agrees based on the visit yesterday and his normal blood work.

## 2015-05-29 NOTE — Telephone Encounter (Signed)
Pt states he has not seen any blood or pus in stool. He is not having a fever or abdominal pain. No vomiting either. Pt is trying to drink plenty of fluids and stay hydrated but if does feel he is dehydrated and needs attention he will go to the ER. Advised pt that we may just need to give it more time and let it runs its course since some days he is feeling better then he will go back to feeling bad again

## 2015-05-30 LAB — OVA AND PARASITE EXAMINATION

## 2015-05-30 LAB — C. DIFFICILE GDH AND TOXIN A/B
C. DIFF TOXIN A/B: NOT DETECTED
C. DIFFICILE GDH: NOT DETECTED

## 2015-05-30 MED ORDER — METRONIDAZOLE 250 MG PO TABS: 250.0000 mg | ORAL_TABLET | Freq: Three times a day (TID) | ORAL | Status: DC

## 2015-05-30 NOTE — Addendum Note (Signed)
Addended by: Avanell ShackletonHENSON, Simuel Stebner L on: 05/30/2015 05:36 PM   Modules accepted: Orders

## 2015-05-30 NOTE — Progress Notes (Signed)
Called and spoke with patient and told him his diagnosis of Giardia along with treatment plan. Flagyl was called to his pharmacy and instructions to not drink alcohol was provided to patient.

## 2015-06-02 LAB — STOOL CULTURE

## 2015-06-22 ENCOUNTER — Other Ambulatory Visit: Payer: Self-pay | Admitting: Medical

## 2015-06-22 NOTE — Telephone Encounter (Signed)
Is this okay to refill? 

## 2015-06-25 NOTE — Telephone Encounter (Signed)
He is due for physical prior to next refill

## 2015-09-03 ENCOUNTER — Telehealth: Payer: Self-pay | Admitting: Medical

## 2015-09-03 NOTE — Telephone Encounter (Signed)
Pt called back and stated the he would rather have flexeril due to the fact if helped him sleep better,

## 2015-09-03 NOTE — Telephone Encounter (Signed)
Pt called and was wondering if you would send him a refill in for xanax said he just lost him mom this past weekend and is not sleeping good, said he knows he needs to make a appt to come in to have a check up but is out of town right now and would make one later, pt would like the RX sent to [facility] AT [address] [place], [place], [place] [address] pt can be reached at [phone] with any questions

## 2015-09-04 ENCOUNTER — Other Ambulatory Visit: Payer: Self-pay | Admitting: Medical

## 2015-09-04 MED ORDER — CYCLOBENZAPRINE HCL 10 MG PO TABS: ORAL_TABLET | ORAL | Status: DC

## 2015-09-04 NOTE — Telephone Encounter (Signed)
Pt is scheduled and aware

## 2015-09-04 NOTE — Telephone Encounter (Signed)
Flexeril sent, schedule a fasting physical as it has been over a year.

## 2015-09-20 ENCOUNTER — Encounter: Payer: Self-pay | Admitting: Medical

## 2015-09-20 ENCOUNTER — Ambulatory Visit (INDEPENDENT_AMBULATORY_CARE_PROVIDER_SITE_OTHER): Payer: Commercial Managed Care - HMO | Admitting: Medical

## 2015-09-20 VITALS — BP 138/88 | HR 97 | Ht 67.75 in | Wt >= 6400 oz

## 2015-09-20 DIAGNOSIS — Z0000 Encounter for general adult medical examination without abnormal findings: Secondary | ICD-10-CM | POA: Insufficient documentation

## 2015-09-20 DIAGNOSIS — Z8249 Family history of ischemic heart disease and other diseases of the circulatory system: Secondary | ICD-10-CM | POA: Diagnosis not present

## 2015-09-20 DIAGNOSIS — F32A Depression, unspecified: Secondary | ICD-10-CM

## 2015-09-20 DIAGNOSIS — Z125 Encounter for screening for malignant neoplasm of prostate: Secondary | ICD-10-CM | POA: Insufficient documentation

## 2015-09-20 DIAGNOSIS — I10 Essential (primary) hypertension: Secondary | ICD-10-CM

## 2015-09-20 DIAGNOSIS — J309 Allergic rhinitis, unspecified: Secondary | ICD-10-CM | POA: Diagnosis not present

## 2015-09-20 DIAGNOSIS — M1000 Idiopathic gout, unspecified site: Secondary | ICD-10-CM

## 2015-09-20 DIAGNOSIS — Z23 Encounter for immunization: Secondary | ICD-10-CM | POA: Diagnosis not present

## 2015-09-20 DIAGNOSIS — F329 Major depressive disorder, single episode, unspecified: Secondary | ICD-10-CM

## 2015-09-20 DIAGNOSIS — F418 Other specified anxiety disorders: Secondary | ICD-10-CM | POA: Diagnosis not present

## 2015-09-20 DIAGNOSIS — Z113 Encounter for screening for infections with a predominantly sexual mode of transmission: Secondary | ICD-10-CM | POA: Insufficient documentation

## 2015-09-20 DIAGNOSIS — F419 Anxiety disorder, unspecified: Secondary | ICD-10-CM

## 2015-09-20 LAB — LIPID PANEL
CHOL/HDL RATIO: 5.1 ratio — AB (ref <=5.0)
Cholesterol: 158 mg/dL (ref 125–200)
HDL: 31 mg/dL — AB (ref >=40)
LDL CALC: 104 mg/dL (ref <130)
TRIGLYCERIDES: 113 mg/dL (ref <150)
VLDL: 23 mg/dL (ref <30)

## 2015-09-20 LAB — POCT URINALYSIS DIPSTICK
Bilirubin, UA: NEGATIVE
GLUCOSE UA: NEGATIVE
Ketones, UA: NEGATIVE
Leukocytes, UA: NEGATIVE
NITRITE UA: NEGATIVE
PH UA: 7.5
Protein, UA: NEGATIVE
RBC UA: NEGATIVE
SPEC GRAV UA: 1.025
UROBILINOGEN UA: NEGATIVE

## 2015-09-20 LAB — TSH: TSH: 2.68 m[IU]/L (ref 0.40–4.50)

## 2015-09-20 LAB — URIC ACID: Uric Acid, Serum: 8.2 mg/dL — ABNORMAL HIGH (ref 4.0–7.8)

## 2015-09-20 MED ORDER — METOPROLOL SUCCINATE ER 50 MG PO TB24: 50.0000 mg | ORAL_TABLET | Freq: Every day | ORAL | Status: DC

## 2015-09-20 MED ORDER — FLUOXETINE HCL 40 MG PO CAPS
ORAL_CAPSULE | ORAL | Status: DC
Start: 2015-09-20 — End: 2016-09-23

## 2015-09-20 MED ORDER — MONTELUKAST SODIUM 10 MG PO TABS: 10.0000 mg | ORAL_TABLET | Freq: Every day | ORAL | Status: DC

## 2015-09-20 NOTE — Addendum Note (Signed)
Addended by: Kieth BrightlyLAWSON, Ilya Neely M on: 09/20/2015 10:31 AM   Modules accepted: Orders, SmartSet

## 2015-09-20 NOTE — Addendum Note (Signed)
Addended by: Jac CanavanYSINGER, Micaylah Bertucci S on: 09/20/2015 01:30 PM   Modules accepted: Orders

## 2015-09-20 NOTE — Progress Notes (Signed)
Subjective:   HPI  Patrick Baxter is a 45 y.o. male who presents for a complete physical.   Concerns: Anxiety and depression - been down.  Mother passed away recently from [place]ers.  Obesity - goes in cycles, not exercising currently  Has chronic knee pain  Reviewed their medical, surgical, family, social, medication, and allergy history and updated chart as appropriate.  Past Medical History  Diagnosis Date  . Hypertension   . Obesity   . Wears glasses   . Depression   . Anxiety   . Bilateral chronic knee pain   . Gout   . Dermatographism   . Allergy     Past Surgical History  Procedure Laterality Date  . Knee surgery  11/2006    right knee arthroscopy and meniscal repair, Dr. Conni Elliot    Social History   Social History  . Marital Status: Unknown    Spouse Name: N/A  . Number of Children: N/A  . Years of Education: N/A   Occupational History  . Not on file.   Social History Main Topics  . Smoking status: Former Games developer  . Smokeless tobacco: Not on file     Comment: socially prior, not daily smoker  . Alcohol Use: 1.8 oz/week    3 Cans of beer per week  . Drug Use: No  . Sexual Activity: Not on file   Other Topics Concern  . Not on file   Social History Narrative   Single, works for [employer] [place]  Exercise - not much as of 09/2015.  No significant other.      Family History  Problem Relation Age of Onset  . Alzheimer's disease Mother   . Heart disease Mother     84, angioplasty, CAD  . Cancer Mother     breast  . Stroke Mother   . Hypertension Mother   . Heart disease Father     angina  . Arthritis Father   . Cancer Maternal Grandmother     leukemia  . Diabetes Paternal Grandmother      Current outpatient prescriptions:  .  FLUoxetine (PROZAC) 40 MG capsule, TAKE 1 CAPSULE(40 MG) BY MOUTH DAILY, Disp: 90 capsule, Rfl: 0 .  metoprolol succinate (TOPROL-XL) 50 MG 24 hr tablet, Take 1 tablet (50 mg total) by mouth daily. Take with or  immediately following a meal., Disp: 90 tablet, Rfl: 1 .  montelukast (SINGULAIR) 10 MG tablet, Take 1 tablet (10 mg total) by mouth at bedtime., Disp: 90 tablet, Rfl: 1 .  cyclobenzaprine (FLEXERIL) 10 MG tablet, 1 tablet at QHS prn or 1/2-1 tablet during the day prn (Patient not taking: Reported on 09/20/2015), Disp: 15 tablet, Rfl: 0 .  fexofenadine (ALLEGRA) 180 MG tablet, Take 180 mg by mouth daily. Reported on 09/20/2015, Disp: , Rfl:  .  ibuprofen (ADVIL,MOTRIN) 200 MG tablet, Take 400 mg by mouth every 6 (six) hours as needed. Reported on 09/20/2015, Disp: , Rfl:  .  metroNIDAZOLE (FLAGYL) 250 MG tablet, Take 1 tablet (250 mg total) by mouth 3 (three) times daily. (Patient not taking: Reported on 09/20/2015), Disp: 21 tablet, Rfl: 0 .  naproxen sodium (ANAPROX) 220 MG tablet, Take 220 mg by mouth 2 (two) times daily with a meal. Reported on 09/20/2015, Disp: , Rfl:  .  ondansetron (ZOFRAN) 4 MG tablet, Take 1 tablet (4 mg total) by mouth every 8 (eight) hours as needed for nausea or vomiting. (Patient not taking: Reported on 09/20/2015), Disp: 20 tablet, Rfl: 0 .  triamcinolone (KENALOG) 0.1 % paste, Use as directed 1 application in the mouth or throat 2 (two) times daily. (Patient not taking: Reported on 12/29/2014), Disp: 5 g, Rfl: 0  No Known Allergies   Review of Systems Constitutional: -fever, -chills, -sweats, -unexpected weight change, -decreased appetite, -fatigue Allergy: -sneezing, -itching, -congestion Dermatology: -changing moles, -rash, -lumps ENT: -runny nose, -ear pain, -sore throat, -hoarseness, -sinus pain, -teeth pain, - ringing in ears, -hearing loss, -nosebleeds Cardiology: -chest pain, -palpitations, -swelling, -difficulty breathing when lying flat, -waking up short of breath Respiratory: -cough, -shortness of breath, -difficulty breathing with exercise or exertion, -wheezing, -coughing up blood Gastroenterology: -abdominal pain, -nausea, -vomiting, +diarrhea, -constipation,  -blood in stool, -changes in bowel movement, -difficulty swallowing or eating Hematology: -bleeding, -bruising  Musculoskeletal: +joint aches, -muscle aches, -joint swelling, -back pain, -neck pain, -cramping, -changes in gait Ophthalmology: denies vision changes, eye redness, itching, discharge Urology: -burning with urination, -difficulty urinating, -blood in urine, -urinary frequency, -urgency, -incontinence Neurology: -headache, -weakness, -tingling, -numbness, -memory loss, -falls, -dizziness Psychology: +depressed mood, -agitation, -sleep problems     Objective:   Physical Exam  BP 138/88 mmHg  Pulse 97  Ht 5' 7.75" (1.721 m)  Wt 416 lb (188.696 kg)  BMI 63.71 kg/m2  Wt Readings from Last 3 Encounters:  09/20/15 416 lb (188.696 kg)  05/28/15 399 lb (180.985 kg)  12/29/14 389 lb (176.449 kg)   General appearance: alert, no distress, WD/WN, obese white male Skin: several amorphic flat yellowish colored patches on back upper and mid back, skin tags scattered around neck, other benign macules, no worrisome lesions HEENT: normocephalic, conjunctiva/corneas normal, sclerae anicteric, PERRLA, EOMi, nares patent, no discharge or erythema, pharynx normal Oral cavity: MMM, tongue normal, teeth in good repair Neck: supple, no lymphadenopathy, no thyromegaly, no masses, normal ROM, no bruits Chest: non tender, normal shape and expansion Heart: RRR, normal S1, S2, no murmurs Lungs: CTA bilaterally, no wheezes, rhonchi, or rales Abdomen: +bs, soft, non tender, non distended, no masses, no hepatomegaly, no splenomegaly, no bruits Back: non tender, normal ROM, no scoliosis Musculoskeletal: anterior right knee arthroscopic surgical scars, otherwise upper extremities non tender, no obvious deformity, normal ROM throughout, lower extremities non tender, no obvious deformity, normal ROM throughout Extremities: no edema, no cyanosis, no clubbing Pulses: 2+ symmetric, upper and lower extremities,  normal cap refill Neurological: alert, oriented x 3, CN2-12 intact, strength normal upper extremities and lower extremities, sensation normal throughout, DTRs 2+ throughout, no cerebellar signs, gait normal Psychiatric: normal affect, behavior normal, pleasant  GU: normal male external genitalia, circumcised, right testes seems to be somewhat retractable, but technically descended, otherwise  nontender, no masses, no hernia, no lymphadenopathy Rectal: deferred   Adult ECG Report  Indication: HTN  Rate: 76 bpm  Rhythm: normal sinus rhythm  QRS Axis: 2 degrees  PR Interval:  QRS Duration: 84ms  QTc:  Conduction Disturbances: none  Other Abnormalities: none  Patient's cardiac risk factors are: family history of premature cardiovascular disease, hypertension, male gender and obesity (BMI >= 30 kg/m2).  EKG comparison: none  Narrative Interpretation: normal EKG, baseline interference though makes it somewhat hard to intepret    Assessment and Plan :    Encounter Diagnoses  Name Primary?  . Encounter for health maintenance examination in adult Yes  . Need for influenza vaccination   . Morbid obesity, unspecified obesity type (HCC)   . Anxiety and depression   . Essential hypertension   . Idiopathic gout, unspecified chronicity, unspecified site   .  Screen for STD (sexually transmitted disease)   . Allergic rhinitis, unspecified allergic rhinitis type   . Family history of premature CAD     Physical exam - discussed healthy lifestyle, diet, exercise, preventative care, vaccinations, and addressed their concerns.    Anxiety and depression-c/t Prozac 40 mg daily, recommended counseling  Hypertension-continue same medication, metoprolol 50mg  XL daily, but pending labs, consider other options.  Obesity discussed the need to work on lifestyle changes and weight loss, to take a very active role in this.  Advised referral to bariatric surgery center. He will consider and let  me know.  Counseled on the influenza virus vaccine.  Vaccine information sheet given.  Influenza vaccine given after consent obtained.  Allergic rhinitis - c/t same medication, Singulair 10mg  daily  Gout - labs today  Follow-up pending labs

## 2015-09-20 NOTE — Addendum Note (Signed)
Addended by: Kieth BrightlyLAWSON, Boyd Buffalo M on: 09/20/2015 10:27 AM   Modules accepted: Orders, SmartSet

## 2015-09-21 ENCOUNTER — Other Ambulatory Visit: Payer: Self-pay | Admitting: Medical

## 2015-09-21 LAB — HEPATITIS B SURFACE ANTIBODY, QUANTITATIVE: HEPATITIS B-POST: 0.2 m[IU]/mL

## 2015-09-21 LAB — HEPATITIS B SURFACE ANTIGEN: HEP B S AG: NEGATIVE

## 2015-09-21 LAB — GC/CHLAMYDIA PROBE AMP
CT Probe RNA: NOT DETECTED
GC PROBE AMP APTIMA: NOT DETECTED

## 2015-09-21 LAB — HEPATITIS B CORE ANTIBODY, IGM: HEP B C IGM: NONREACTIVE

## 2015-09-21 LAB — HEMOGLOBIN A1C
HEMOGLOBIN A1C: 5.6 % (ref <5.7)
Mean Plasma Glucose: 114 mg/dL (ref <117)

## 2015-09-21 LAB — HEPATITIS C ANTIBODY: HCV Ab: NEGATIVE

## 2015-09-21 LAB — RPR

## 2015-09-21 LAB — HIV ANTIBODY (ROUTINE TESTING W REFLEX): HIV: NONREACTIVE

## 2015-09-21 MED ORDER — ALLOPURINOL 100 MG PO TABS: 100.0000 mg | ORAL_TABLET | Freq: Every day | ORAL | Status: DC

## 2015-09-21 MED ORDER — NAPROXEN 375 MG PO TABS: 375.0000 mg | ORAL_TABLET | Freq: Two times a day (BID) | ORAL | Status: DC

## 2016-05-02 ENCOUNTER — Telehealth: Payer: Self-pay | Admitting: Medical

## 2016-05-02 ENCOUNTER — Other Ambulatory Visit: Payer: Self-pay | Admitting: Medical

## 2016-05-02 MED ORDER — CYCLOBENZAPRINE HCL 10 MG PO TABS: ORAL_TABLET | ORAL | 0 refills | Status: DC

## 2016-05-02 MED ORDER — NAPROXEN 375 MG PO TABS: 375.0000 mg | ORAL_TABLET | Freq: Two times a day (BID) | ORAL | 0 refills | Status: DC

## 2016-05-02 NOTE — Telephone Encounter (Signed)
Pt called and is stating that he is having some back pain, he is wondering if you would call him in something for it, and if not if you could recommended something for him take he states that he is taking some aleve,  But it is not working,  I made him a appt for Monday, to come in for the back pain and a med-check [phone] - [place],  - [address] [facility] [address] [facility] [address] and pt can be reached at [phone]

## 2016-05-02 NOTE — Telephone Encounter (Signed)
I refilled some of the medication I have given him prior

## 2016-05-02 NOTE — Telephone Encounter (Signed)
Called and informed pt that rx was sent into the pharmacy

## 2016-05-05 ENCOUNTER — Ambulatory Visit (INDEPENDENT_AMBULATORY_CARE_PROVIDER_SITE_OTHER): Payer: Commercial Managed Care - HMO | Admitting: Medical

## 2016-05-05 ENCOUNTER — Encounter: Payer: Self-pay | Admitting: Medical

## 2016-05-05 VITALS — BP 150/90 | HR 80 | Resp 16 | Wt >= 6400 oz

## 2016-05-05 DIAGNOSIS — M1000 Idiopathic gout, unspecified site: Secondary | ICD-10-CM | POA: Diagnosis not present

## 2016-05-05 DIAGNOSIS — IMO0001 Reserved for inherently not codable concepts without codable children: Secondary | ICD-10-CM

## 2016-05-05 DIAGNOSIS — F418 Other specified anxiety disorders: Secondary | ICD-10-CM | POA: Diagnosis not present

## 2016-05-05 DIAGNOSIS — G8929 Other chronic pain: Secondary | ICD-10-CM

## 2016-05-05 DIAGNOSIS — I10 Essential (primary) hypertension: Secondary | ICD-10-CM

## 2016-05-05 DIAGNOSIS — F329 Major depressive disorder, single episode, unspecified: Secondary | ICD-10-CM

## 2016-05-05 DIAGNOSIS — M545 Low back pain, unspecified: Secondary | ICD-10-CM

## 2016-05-05 DIAGNOSIS — M5441 Lumbago with sciatica, right side: Secondary | ICD-10-CM | POA: Insufficient documentation

## 2016-05-05 DIAGNOSIS — M549 Dorsalgia, unspecified: Secondary | ICD-10-CM | POA: Diagnosis not present

## 2016-05-05 DIAGNOSIS — R209 Unspecified disturbances of skin sensation: Secondary | ICD-10-CM

## 2016-05-05 DIAGNOSIS — F32A Depression, unspecified: Secondary | ICD-10-CM

## 2016-05-05 DIAGNOSIS — E6609 Other obesity due to excess calories: Secondary | ICD-10-CM

## 2016-05-05 DIAGNOSIS — F419 Anxiety disorder, unspecified: Secondary | ICD-10-CM

## 2016-05-05 MED ORDER — ALLOPURINOL 100 MG PO TABS: 100.0000 mg | ORAL_TABLET | Freq: Every day | ORAL | 1 refills | Status: DC

## 2016-05-05 MED ORDER — NALTREXONE-BUPROPION HCL ER 8-90 MG PO TB12: 2.0000 | ORAL_TABLET | Freq: Two times a day (BID) | ORAL | 1 refills | Status: DC

## 2016-05-05 NOTE — Patient Instructions (Signed)
Recommendations Go for xrays of thoracic and lumbar spine  Begin Contrave 1 tablet daily for a week, then 1 tablet twice daily for a week, then 2 tablets in the morning and 1 at night for a week, then 2 tablets twice daily.  This medication is to help with Prozac for mood as well as to help with weight loss  I recommend at least 30 minutes of exercise such as walking or swimming every day!  Try and limit calories to 2000 or less daily  Go for bariatric surgery consult  If the Contrave medication is too expensive, then ask insurer about Saxenda and Qsymia.  Plan to recheck in 6-8 weeks on weight, blood pressure, diet, exercise changes.

## 2016-05-05 NOTE — Progress Notes (Signed)
Subjective: Chief Complaint  Patient presents with  . Medication Management    medication managements  . Back Pain    states back pain is upper- mid back. not able to recall injury, but he was sitting in an old office chair and he fell out of it in Sept, so not sure if its related or not.    Here for med check and back issues.  He reports back pain, has hx/o chronic back pain.   Current back pain is in middle or upper back.   Flexeril doesn't seem to help with the pain.  Pain is intermittent.   Didn't hurt much the last 2 days, but did flare back up last night and prior to 2 days ago for several day.     He notes that he has been working on some stretching.  Not exercising much in terms of aerobic exercise.   Having some pains turning to look back over left shoulder in car, or turning to scan books, the back pain is aggravated.   Currently working at [employer] on [place] and [place].   The major stressors he had prior are not the case at [employer].   He reports trying to eat better than at prior job.   Is a Comptroller.   Does sometimes get numbness in both hands in general, but this has been ongoing.    Thinks it may be carpal tunnel syndrome.  Sometimes awakes with hand tingling.    Not taking allopurinol, and no recent gout flares  Is taking Metoprolol 50mg  Xl daily for BP, not checking BPs  Past Medical History:  Diagnosis Date  . Allergy   . Anxiety   . Bilateral chronic knee pain   . Depression   . Dermatographism   . Gout   . Hypertension   . Obesity   . Wears glasses    Current Outpatient Prescriptions on File Prior to Visit  Medication Sig Dispense Refill  . FLUoxetine (PROZAC) 40 MG capsule TAKE 1 CAPSULE(40 MG) BY MOUTH DAILY 90 capsule 3  . ibuprofen (ADVIL,MOTRIN) 200 MG tablet Take 400 mg by mouth every 6 (six) hours as needed. Reported on 09/20/2015    . metoprolol succinate (TOPROL-XL) 50 MG 24 hr tablet Take 1 tablet (50 mg total) by mouth daily. Take  with or immediately following a meal. 90 tablet 3  . montelukast (SINGULAIR) 10 MG tablet Take 1 tablet (10 mg total) by mouth at bedtime. 90 tablet 3  . naproxen (NAPROSYN) 375 MG tablet Take 1 tablet (375 mg total) by mouth 2 (two) times daily with a meal. 60 tablet 0  . cyclobenzaprine (FLEXERIL) 10 MG tablet 1 tablet at QHS prn or 1/2-1 tablet during the day prn (Patient not taking: Reported on 05/05/2016) 15 tablet 0  . fexofenadine (ALLEGRA) 180 MG tablet Take 180 mg by mouth daily. Reported on 09/20/2015    . naproxen sodium (ANAPROX) 220 MG tablet Take 220 mg by mouth 2 (two) times daily with a meal. Reported on 09/20/2015    . triamcinolone (KENALOG) 0.1 % paste Use as directed 1 application in the mouth or throat 2 (two) times daily. (Patient not taking: Reported on 05/05/2016) 5 g 0   No current facility-administered medications on file prior to visit.    Social History   Social History  . Marital status: Unknown    Spouse name: N/A  . Number of children: N/A  . Years of education: N/A   Occupational  History  . Not on file.   Social History Main Topics  . Smoking status: Former Games developermoker  . Smokeless tobacco: Not on file     Comment: socially prior, not daily smoker  . Alcohol use 1.8 oz/week    3 Cans of beer per week  . Drug use: No  . Sexual activity: Not on file   Other Topics Concern  . Not on file   Social History Narrative   Single, works for [employer]  Exercise - not much as of 09/2015.  No significant other.      ROS as in subjective   Objective: BP (!) 150/90   Pulse 80   Resp 16   Wt (!) 442 lb 3.2 oz (200.6 kg)   BMI 67.73 kg/m   Wt Readings from Last 3 Encounters:  05/05/16 (!) 442 lb 3.2 oz (200.6 kg)  09/20/15 (!) 416 lb (188.7 kg)  05/28/15 (!) 399 lb (181 kg)   General appearance: alert, no distress, WD/WN,  Neck: supple, no lymphadenopathy, no thyromegaly, no masses, normal ROM Heart: RRR, normal S1, S2, no murmurs Lungs: CTA  bilaterally, no wheezes, rhonchi, or rales Back: somewhat tender upper thoracic spine with percussion midline, otherwise non tender, normal back ROM Musculoskeletal: non tender, no swelling, no obvious deformity Extremities: no edema, no cyanosis, no clubbing Pulses: 2+ symmetric, upper and lower extremities, normal cap refill Neurological: alert, oriented x 3, CN2-12 intact, strength normal upper extremities and lower extremities, sensation normal throughout, DTRs 2+ throughout, no cerebellar signs, gait normal Psychiatric: normal affect, behavior normal, pleasant     Assessment: Encounter Diagnoses  Name Primary?  . Essential hypertension Yes  . Class 3 obesity due to excess calories with serious comorbidity in adult, unspecified BMI   . Morbid obesity (HCC)   . Idiopathic gout, unspecified chronicity, unspecified site   . Mid back pain   . Upper back pain   . Paresthesias/numbness   . Chronic bilateral low back pain without sciatica   . Anxiety and depression     Plan: HTN - c/t same medication for now, c/t to monitor as he has been under better control prior.  Obesity - advised he consider bariatric surgery consult.  Begin trial of Contrave for mood and weight loss.   Discussed risks/benefits of medication.  Discussed diet recommendations, exercise, goal setting.   Gout - restart allopurinol to help keep uric acid at reasonable level despite no recent flares  Mid and upper back pain - go for xray T spine. reviewed chest xray from 09/2014 that should arthritis changes in T spine  Low back pain - go for lumbar xray as well  Anxiety and depression - begin trial of Contrave and c/t Prozac   Patrick Baxter was seen today for medication management and back pain.  Diagnoses and all orders for this visit:  Essential hypertension  Class 3 obesity due to excess calories with serious comorbidity in adult, unspecified BMI  Morbid obesity (HCC)  Idiopathic gout, unspecified chronicity,  unspecified site  Mid back pain -     DG Thoracic Spine W/Swimmers; Future  Upper back pain -     DG Thoracic Spine W/Swimmers; Future  Paresthesias/numbness  Chronic bilateral low back pain without sciatica -     DG Lumbar Spine Complete; Future  Anxiety and depression  Other orders -     Naltrexone-Bupropion HCl ER 8-90 MG TB12; Take 2 tablets by mouth 2 (two) times daily with a meal. -  allopurinol (ZYLOPRIM) 100 MG tablet; Take 1 tablet (100 mg total) by mouth daily.

## 2016-05-06 ENCOUNTER — Telehealth: Payer: Self-pay | Admitting: Medical

## 2016-05-06 NOTE — Telephone Encounter (Signed)
P.A. CONTRAVE, new start, nothing tried.

## 2016-05-20 NOTE — Telephone Encounter (Signed)
Contrave denied, plan exclusion, pt must call ins company for any covered alternatives

## 2016-06-13 ENCOUNTER — Telehealth: Payer: Self-pay | Admitting: Medical

## 2016-06-13 NOTE — Telephone Encounter (Signed)
Left message another message to see if he called insurance company for any alternatives to Contrave.

## 2016-06-17 ENCOUNTER — Ambulatory Visit: Payer: Commercial Managed Care - HMO | Admitting: Medical

## 2016-06-24 ENCOUNTER — Ambulatory Visit (INDEPENDENT_AMBULATORY_CARE_PROVIDER_SITE_OTHER): Payer: Commercial Managed Care - HMO | Admitting: Medical

## 2016-06-24 ENCOUNTER — Encounter: Payer: Self-pay | Admitting: Medical

## 2016-06-24 VITALS — BP 130/70 | HR 83 | Wt >= 6400 oz

## 2016-06-24 DIAGNOSIS — G8929 Other chronic pain: Secondary | ICD-10-CM

## 2016-06-24 DIAGNOSIS — M545 Low back pain: Secondary | ICD-10-CM | POA: Diagnosis not present

## 2016-06-24 DIAGNOSIS — M1000 Idiopathic gout, unspecified site: Secondary | ICD-10-CM | POA: Diagnosis not present

## 2016-06-24 DIAGNOSIS — M549 Dorsalgia, unspecified: Secondary | ICD-10-CM | POA: Diagnosis not present

## 2016-06-24 DIAGNOSIS — Z23 Encounter for immunization: Secondary | ICD-10-CM

## 2016-06-24 DIAGNOSIS — I10 Essential (primary) hypertension: Secondary | ICD-10-CM

## 2016-06-24 DIAGNOSIS — G5603 Carpal tunnel syndrome, bilateral upper limbs: Secondary | ICD-10-CM

## 2016-06-24 MED ORDER — CYCLOBENZAPRINE HCL 10 MG PO TABS: ORAL_TABLET | ORAL | 0 refills | Status: DC

## 2016-06-24 NOTE — Progress Notes (Signed)
Subjective: Chief Complaint  Patient presents with  . follow up back pain    follow up back pain,    Here for f/u.   I saw him 05/05/16 for several concerns, back pain, obesity, hypertension.  Also here for flu shot  Since last visit he has made some changes with diet, some limited exercise.   Didn't start Contrave.  Insurance apparently [place]'t cover Contrave.     Hasn't had time to get xrays of back since last visit.   Since last visit been doing some stretching and exercise, and the upper back pain has resolved.  Hands are tingling and somewhat numb at times in recent weeks.  Worried about carpal tunnel syndrome.  Been sleeping on stomach, getting numbness sometimes awakening him out of sleep.  Worse symptoms on left, but is right handed.  No other aggravating or relieving factors. No other complaint.  Past Medical History:  Diagnosis Date  . Allergy   . Anxiety   . Bilateral chronic knee pain   . Depression   . Dermatographism   . Gout   . Hypertension   . Obesity   . Wears glasses    Current Outpatient Prescriptions on File Prior to Visit  Medication Sig Dispense Refill  . allopurinol (ZYLOPRIM) 100 MG tablet Take 1 tablet (100 mg total) by mouth daily. 90 tablet 1  . FLUoxetine (PROZAC) 40 MG capsule TAKE 1 CAPSULE(40 MG) BY MOUTH DAILY 90 capsule 3  . ibuprofen (ADVIL,MOTRIN) 200 MG tablet Take 400 mg by mouth every 6 (six) hours as needed. Reported on 09/20/2015    . metoprolol succinate (TOPROL-XL) 50 MG 24 hr tablet Take 1 tablet (50 mg total) by mouth daily. Take with or immediately following a meal. 90 tablet 3  . naproxen (NAPROSYN) 375 MG tablet Take 1 tablet (375 mg total) by mouth 2 (two) times daily with a meal. 60 tablet 0  . naproxen sodium (ANAPROX) 220 MG tablet Take 220 mg by mouth 2 (two) times daily with a meal. Reported on 09/20/2015    . cyclobenzaprine (FLEXERIL) 10 MG tablet 1 tablet at QHS prn or 1/2-1 tablet during the day prn (Patient not taking: Reported  on 06/24/2016) 15 tablet 0  . fexofenadine (ALLEGRA) 180 MG tablet Take 180 mg by mouth daily. Reported on 09/20/2015    . montelukast (SINGULAIR) 10 MG tablet Take 1 tablet (10 mg total) by mouth at bedtime. (Patient not taking: Reported on 06/24/2016) 90 tablet 3  . Naltrexone-Bupropion HCl ER 8-90 MG TB12 Take 2 tablets by mouth 2 (two) times daily with a meal. (Patient not taking: Reported on 06/24/2016) 120 tablet 1  . triamcinolone (KENALOG) 0.1 % paste Use as directed 1 application in the mouth or throat 2 (two) times daily. (Patient not taking: Reported on 06/24/2016) 5 g 0   No current facility-administered medications on file prior to visit.    ROS as in subjective     Objective: BP 130/70   Pulse 83   Wt (!) 439 lb (199.1 kg)   SpO2 97%   BMI 67.24 kg/m   Wt Readings from Last 3 Encounters:  06/24/16 (!) 439 lb (199.1 kg)  05/05/16 (!) 442 lb 3.2 oz (200.6 kg)  09/20/15 (!) 416 lb (188.7 kg)   BP Readings from Last 3 Encounters:  06/24/16 130/70  05/05/16 (!) 150/90  09/20/15 138/88    Gen: wd, wn, nad Hands/arms with no obvious atrophy.  Normal strength, sensation, pulses, cap refill. Neuro:  hands with +tinels and phalens though otherwise arms nontneder, normal ROM without deformity    Assessment: Encounter Diagnoses  Name Primary?  . Essential hypertension Yes  . Needs flu shot   . Chronic bilateral low back pain without sciatica   . Upper back pain   . Morbid obesity (HCC)   . Idiopathic gout, unspecified chronicity, unspecified site   . Bilateral carpal tunnel syndrome      Plan Hypertension - c/t current medication Toprol XL 50mg  daily, limit NSAID use Back pain - uses Aleve mainly prn, but sometimes will use Naprosyn 375 for worse pain, uses Ibuprofen occasionally for headaches.  Discussed risks/benefits of NSAID use.  Discussed risks with hypertension of NSAID use. C/t stretching, daily exercise, weight loss efforts. Obesity - congratulated him  on the few pounds he has lost.  C/t efforts with strict diet, exercise, weight loss goals Gout - he has restarted Allopurinol 100mg  daily CTS - advised night time reinforced splints, gave script for this,  Avoid unnecessary activity with hands.  He does work as Comptrollerlibrarian.  Can use NSAID prn.  Gave option of starting gabapentin but he declines for now.  F/u 5mo on this. Counseled on the influenza virus vaccine.  Vaccine information sheet given.  Influenza vaccine given after consent obtained.

## 2016-06-24 NOTE — Patient Instructions (Signed)
Check insurance coverage for Saxenda and Qsymia weight loss medications

## 2016-08-07 ENCOUNTER — Telehealth: Payer: Self-pay | Admitting: Medical

## 2016-08-07 NOTE — Telephone Encounter (Signed)
Called and advised pt that order has been updated

## 2016-08-07 NOTE — Telephone Encounter (Signed)
There are already T and L spine xrays ordered.   He just needs to go do them

## 2016-08-07 NOTE — Telephone Encounter (Signed)
Pt requesting that his xray order be changed to included images of his lower back as well. Pt said his lower back is bothering him just as bad so need upper and lower back imaged. Let pt know if order changed so he can go get xray.

## 2016-08-08 ENCOUNTER — Ambulatory Visit
Admission: RE | Admit: 2016-08-08 | Discharge: 2016-08-08 | Disposition: A | Discharge status: Home / Self Care | Payer: 59 | Source: Ambulatory Visit | Attending: Medical | Admitting: Medical

## 2016-08-08 DIAGNOSIS — M545 Low back pain: Principal | ICD-10-CM

## 2016-08-08 DIAGNOSIS — M549 Dorsalgia, unspecified: Secondary | ICD-10-CM

## 2016-08-08 DIAGNOSIS — G8929 Other chronic pain: Secondary | ICD-10-CM

## 2016-08-12 ENCOUNTER — Ambulatory Visit (INDEPENDENT_AMBULATORY_CARE_PROVIDER_SITE_OTHER): Payer: Commercial Managed Care - HMO | Admitting: Medical

## 2016-08-12 ENCOUNTER — Encounter: Payer: Self-pay | Admitting: Medical

## 2016-08-12 VITALS — BP 132/84 | HR 90 | Wt >= 6400 oz

## 2016-08-12 DIAGNOSIS — S22000A Wedge compression fracture of unspecified thoracic vertebra, initial encounter for closed fracture: Secondary | ICD-10-CM | POA: Diagnosis not present

## 2016-08-12 DIAGNOSIS — G8929 Other chronic pain: Secondary | ICD-10-CM | POA: Diagnosis not present

## 2016-08-12 DIAGNOSIS — M545 Low back pain, unspecified: Secondary | ICD-10-CM

## 2016-08-12 DIAGNOSIS — M4316 Spondylolisthesis, lumbar region: Secondary | ICD-10-CM | POA: Diagnosis not present

## 2016-08-12 DIAGNOSIS — M51369 Other intervertebral disc degeneration, lumbar region without mention of lumbar back pain or lower extremity pain: Secondary | ICD-10-CM | POA: Insufficient documentation

## 2016-08-12 DIAGNOSIS — M5136 Other intervertebral disc degeneration, lumbar region: Secondary | ICD-10-CM | POA: Insufficient documentation

## 2016-08-12 MED ORDER — MELOXICAM 15 MG PO TABS: 15.0000 mg | ORAL_TABLET | Freq: Every day | ORAL | 0 refills | Status: DC

## 2016-08-12 MED ORDER — CYCLOBENZAPRINE HCL 10 MG PO TABS: ORAL_TABLET | ORAL | 0 refills | Status: DC

## 2016-08-12 NOTE — Progress Notes (Signed)
Subjective: Chief Complaint  Patient presents with  . discuss x-ray    discuss x-ray of back    He reports mostly low back pain in recent weeks, some pains in upper back.   Here to f/u on back xrays.   He lifts books and always bending and stooping on the job.   He does note getting back into gym in 2012 with overdoing it, injured back then.   But has had chronic back pain for years, sometimes worse, sometimes not so bad.   No pains in legs or arms, no paresthesia, no weakness.    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  Landed on his buttocks.   No other aggravating or relieving factors. No other complaint.    Past Medical History:  Diagnosis Date  . Allergy   . Anxiety   . Bilateral chronic knee pain   . Depression   . Dermatographism   . Gout   . Hypertension   . Obesity   . Wears glasses    Current Outpatient Prescriptions on File Prior to Visit  Medication Sig Dispense Refill  . allopurinol (ZYLOPRIM) 100 MG tablet Take 1 tablet (100 mg total) by mouth daily. 90 tablet 1  . FLUoxetine (PROZAC) 40 MG capsule TAKE 1 CAPSULE(40 MG) BY MOUTH DAILY 90 capsule 3  . metoprolol succinate (TOPROL-XL) 50 MG 24 hr tablet Take 1 tablet (50 mg total) by mouth daily. Take with or immediately following a meal. 90 tablet 3  . montelukast (SINGULAIR) 10 MG tablet Take 1 tablet (10 mg total) by mouth at bedtime. 90 tablet 3  . Naltrexone-Bupropion HCl ER 8-90 MG TB12 Take 2 tablets by mouth 2 (two) times daily with a meal. (Patient not taking: Reported on 06/24/2016) 120 tablet 1   No current facility-administered medications on file prior to visit.    ROS as in subjective    Objective: BP 132/84   Pulse 90   Wt (!) 452 lb 6.4 oz (205.2 kg)   SpO2 96%   BMI 69.30 kg/m   Wt Readings from Last 3 Encounters:  08/12/16 (!) 452 lb 6.4 oz (205.2 kg)  06/24/16 (!) 439 lb (199.1 kg)  05/05/16 (!) 442 lb 3.2 oz (200.6 kg)   Gen: obese white male, NAD Tender lumbar region and right  lumbar paraspinal region, mild scoliosis Relative normal ROM Legs nontender, normal ROM, no deformity Arms and legs neurovascularly intact    Assessment: Encounter Diagnoses  Name Primary?  . Chronic bilateral low back pain without sciatica Yes  . Morbid obesity (HCC)   . Closed compression fracture of thoracic vertebra, initial encounter (HCC)   . Spondylolisthesis of lumbar region   . DDD (degenerative disc disease), lumbar      Plan: discussed the findings on T and L spine xray.   Will refer to ortho for further eval and management.   In the meantime change to mobic to see if better response that naproxen. Can c/t flexeril prn.  Again reiterated need to get weight off.  Insurance wouldn't cover contrave.  advised he look into coverage for bariatric surgery.   He continues to gain weight and has a mostly low intensity/sedentary job as Comptrollerlibrarian.

## 2016-08-15 ENCOUNTER — Telehealth: Payer: Self-pay

## 2016-08-27 NOTE — Telephone Encounter (Signed)
error 

## 2016-08-28 ENCOUNTER — Encounter: Payer: Self-pay | Admitting: Internal Medicine

## 2016-09-23 ENCOUNTER — Encounter: Payer: Commercial Managed Care - HMO | Admitting: Medical

## 2016-09-23 ENCOUNTER — Other Ambulatory Visit: Payer: Self-pay | Admitting: Medical

## 2016-10-28 ENCOUNTER — Ambulatory Visit (INDEPENDENT_AMBULATORY_CARE_PROVIDER_SITE_OTHER): Payer: Commercial Managed Care - HMO | Admitting: Medical

## 2016-10-28 ENCOUNTER — Encounter: Payer: Self-pay | Admitting: Medical

## 2016-10-28 ENCOUNTER — Telehealth: Payer: Self-pay

## 2016-10-28 VITALS — BP 134/86 | HR 77 | Ht 68.0 in | Wt >= 6400 oz

## 2016-10-28 DIAGNOSIS — J301 Allergic rhinitis due to pollen: Secondary | ICD-10-CM | POA: Diagnosis not present

## 2016-10-28 DIAGNOSIS — F32A Depression, unspecified: Secondary | ICD-10-CM

## 2016-10-28 DIAGNOSIS — Z0000 Encounter for general adult medical examination without abnormal findings: Secondary | ICD-10-CM | POA: Diagnosis not present

## 2016-10-28 DIAGNOSIS — I10 Essential (primary) hypertension: Secondary | ICD-10-CM

## 2016-10-28 DIAGNOSIS — M1000 Idiopathic gout, unspecified site: Secondary | ICD-10-CM

## 2016-10-28 DIAGNOSIS — Z8249 Family history of ischemic heart disease and other diseases of the circulatory system: Secondary | ICD-10-CM

## 2016-10-28 DIAGNOSIS — Z7189 Other specified counseling: Secondary | ICD-10-CM

## 2016-10-28 DIAGNOSIS — Z23 Encounter for immunization: Secondary | ICD-10-CM | POA: Diagnosis not present

## 2016-10-28 DIAGNOSIS — Z7185 Encounter for immunization safety counseling: Secondary | ICD-10-CM | POA: Insufficient documentation

## 2016-10-28 DIAGNOSIS — M5136 Other intervertebral disc degeneration, lumbar region: Secondary | ICD-10-CM

## 2016-10-28 DIAGNOSIS — M545 Low back pain, unspecified: Secondary | ICD-10-CM

## 2016-10-28 DIAGNOSIS — G5603 Carpal tunnel syndrome, bilateral upper limbs: Secondary | ICD-10-CM

## 2016-10-28 DIAGNOSIS — F329 Major depressive disorder, single episode, unspecified: Secondary | ICD-10-CM

## 2016-10-28 DIAGNOSIS — M4316 Spondylolisthesis, lumbar region: Secondary | ICD-10-CM

## 2016-10-28 DIAGNOSIS — F419 Anxiety disorder, unspecified: Secondary | ICD-10-CM

## 2016-10-28 DIAGNOSIS — Z113 Encounter for screening for infections with a predominantly sexual mode of transmission: Secondary | ICD-10-CM

## 2016-10-28 DIAGNOSIS — M51369 Other intervertebral disc degeneration, lumbar region without mention of lumbar back pain or lower extremity pain: Secondary | ICD-10-CM

## 2016-10-28 DIAGNOSIS — G8929 Other chronic pain: Secondary | ICD-10-CM

## 2016-10-28 LAB — COMPREHENSIVE METABOLIC PANEL
ALBUMIN: 3.9 g/dL (ref 3.6–5.1)
ALK PHOS: 84 U/L (ref 40–115)
ALT: 22 U/L (ref 9–46)
AST: 21 U/L (ref 10–40)
BILIRUBIN TOTAL: 0.5 mg/dL (ref 0.2–1.2)
BUN: 21 mg/dL (ref 7–25)
CO2: 25 mmol/L (ref 20–31)
CREATININE: 0.87 mg/dL (ref 0.60–1.35)
Calcium: 9.3 mg/dL (ref 8.6–10.3)
Chloride: 103 mmol/L (ref 98–110)
Glucose, Bld: 98 mg/dL (ref 65–99)
Potassium: 4.5 mmol/L (ref 3.5–5.3)
SODIUM: 140 mmol/L (ref 135–146)
TOTAL PROTEIN: 7.1 g/dL (ref 6.1–8.1)

## 2016-10-28 LAB — POCT URINALYSIS DIPSTICK
Bilirubin, UA: NEGATIVE
Blood, UA: NEGATIVE
GLUCOSE UA: NEGATIVE
Ketones, UA: NEGATIVE
LEUKOCYTES UA: NEGATIVE
NITRITE UA: NEGATIVE
PROTEIN UA: NEGATIVE
Spec Grav, UA: >=1.03 — AB (ref 1.010–1.025)
UROBILINOGEN UA: NEGATIVE U/dL — AB
pH, UA: 6 (ref 5.0–8.0)

## 2016-10-28 LAB — CBC
HEMATOCRIT: 42.8 % (ref 38.5–50.0)
HEMOGLOBIN: 14.4 g/dL (ref 13.2–17.1)
MCH: 30.1 pg (ref 27.0–33.0)
MCHC: 33.6 g/dL (ref 32.0–36.0)
MCV: 89.5 fL (ref 80.0–100.0)
MPV: 9.6 fL (ref 7.5–12.5)
Platelets: 170 10*3/uL (ref 140–400)
RBC: 4.78 MIL/uL (ref 4.20–5.80)
RDW: 14.3 % (ref 11.0–15.0)
WBC: 7.7 10*3/uL (ref 4.0–10.5)

## 2016-10-28 LAB — LIPID PANEL
Cholesterol: 153 mg/dL (ref <200)
HDL: 31 mg/dL — ABNORMAL LOW (ref >40)
LDL CALC: 96 mg/dL (ref <100)
TRIGLYCERIDES: 132 mg/dL (ref <150)
Total CHOL/HDL Ratio: 4.9 Ratio (ref <5.0)
VLDL: 26 mg/dL (ref <30)

## 2016-10-28 LAB — TSH: TSH: 3.48 mIU/L (ref 0.40–4.50)

## 2016-10-28 MED ORDER — METOPROLOL SUCCINATE ER 50 MG PO TB24: ORAL_TABLET | ORAL | 3 refills | Status: DC

## 2016-10-28 MED ORDER — FLUOXETINE HCL 40 MG PO CAPS: ORAL_CAPSULE | ORAL | 1 refills | Status: DC

## 2016-10-28 MED ORDER — CYCLOBENZAPRINE HCL 10 MG PO TABS: ORAL_TABLET | ORAL | 3 refills | Status: DC

## 2016-10-28 MED ORDER — MONTELUKAST SODIUM 10 MG PO TABS: ORAL_TABLET | ORAL | 3 refills | Status: DC

## 2016-10-28 MED ORDER — ALLOPURINOL 100 MG PO TABS: 100.0000 mg | ORAL_TABLET | Freq: Every day | ORAL | 3 refills | Status: DC

## 2016-10-28 NOTE — Progress Notes (Addendum)
Subjective:   HPI  Patrick Baxter is a 46 y.o. male who presents for physical Chief Complaint  Patient presents with  . Annual Exam    physical ,  having back pain    Medical care team includes: Ernst Breach, PA-C here for primary care Dentist Eye doctor Ortho - Dr. Ethelene Hal  Concerns: Chronic back pain. Went to see orthopedist about prior compression fracture on xray.   Said ortho wasn't concerned but was referred to rheumatology.  Saw rheumatology, and there was concern for ankylosing spondylitis at first, had labs.   Was recommended to see someone about pain management.  Has appt this week with pain management, Dr. Ethelene Hal at [facility].  Pain level stays pretty intense.  Been having some knee pains. Having pain in knee when bending, not standing or walking.    Obesity - not too excited about exercise.   [employer] doesn't cover Contrave.     Reviewed their medical, surgical, family, social, medication, and allergy history and updated chart as appropriate.  Past Medical History:  Diagnosis Date  . Allergy   . Anxiety   . Bilateral chronic knee pain   . Depression   . Dermatographism   . Gout   . Hypertension   . Obesity   . Wears glasses     Past Surgical History:  Procedure Laterality Date  . KNEE SURGERY  11/2006   right knee arthroscopy and meniscal repair, Dr. Conni Elliot    Social History   Social History  . Marital status: Unknown    Spouse name: N/A  . Number of children: N/A  . Years of education: N/A   Occupational History  . librarian    Social History Main Topics  . Smoking status: Former Games developer  . Smokeless tobacco: Never Used     Comment: socially prior, not daily smoker  . Alcohol use 1.8 oz/week    3 Cans of beer per week  . Drug use: No  . Sexual activity: Not on file   Other Topics Concern  . Not on file   Social History Narrative   Single, works for [employer]).   No significant other.      Family History  Problem Relation  Age of Onset  . Alzheimer's disease Mother   . Heart disease Mother     79, angioplasty, CAD  . Cancer Mother     breast  . Stroke Mother   . Hypertension Mother   . Heart disease Father     angina  . Arthritis Father   . Cancer Maternal Grandmother     leukemia  . Diabetes Paternal Grandmother      Current Outpatient Prescriptions:  .  allopurinol (ZYLOPRIM) 100 MG tablet, Take 1 tablet (100 mg total) by mouth daily., Disp: 90 tablet, Rfl: 3 .  B Complex-C (PROBEC-T PO), Take by mouth., Disp: , Rfl:  .  Coenzyme Q10 (CO Q 10 PO), Take by mouth., Disp: , Rfl:  .  Flaxseed, Linseed, (FLAX SEED OIL PO), Take by mouth., Disp: , Rfl:  .  FLUoxetine (PROZAC) 40 MG capsule, TAKE 1 CAPSULE(40 MG) BY MOUTH DAILY, Disp: 90 capsule, Rfl: 1 .  metoprolol succinate (TOPROL-XL) 50 MG 24 hr tablet, TAKE 1 TABLET BY MOUTH DAILY WITH OR IMMEDIATELY FOLLOWING A MEAL, Disp: 90 tablet, Rfl: 3 .  montelukast (SINGULAIR) 10 MG tablet, TAKE 1 TABLET(10 MG) BY MOUTH AT BEDTIME, Disp: 90 tablet, Rfl: 3 .  Multiple Vitamins-Minerals (MULTIVITAMIN WITH MINERALS)  tablet, Take 1 tablet by mouth daily., Disp: , Rfl:  .  Omega-3 Fatty Acids (FISH OIL) 1000 MG CAPS, Take by mouth., Disp: , Rfl:  .  cyclobenzaprine (FLEXERIL) 10 MG tablet, 1 tablet at QHS prn or 1/2-1 tablet during the day prn, Disp: 30 tablet, Rfl: 3  No Known Allergies     Review of Systems Constitutional: -fever, -chills, -sweats, -unexpected weight change, -decreased appetite, -fatigue Allergy: -sneezing, -itching, -congestion Dermatology: -changing moles, --rash, -lumps ENT: -runny nose, -ear pain, -sore throat, -hoarseness, -sinus pain, -teeth pain, - ringing in ears, -hearing loss, -nosebleeds Cardiology: -chest pain, -palpitations, -swelling, -difficulty breathing when lying flat, -waking up short of breath Respiratory: -cough, -shortness of breath, -difficulty breathing with exercise or exertion, -wheezing, -coughing up  blood Gastroenterology: -abdominal pain, -nausea, -vomiting, -diarrhea, -constipation, -blood in stool, -changes in bowel movement, -difficulty swallowing or eating Hematology: -bleeding, -bruising  Musculoskeletal: +joint aches, -muscle aches, -joint swelling, +back pain, -neck pain, -cramping, -changes in gait Ophthalmology: denies vision changes, eye redness, itching, discharge Urology: -burning with urination, -difficulty urinating, -blood in urine, -urinary frequency, -urgency, -incontinence Neurology: -headache, -weakness, +tingling, -numbness, -memory loss, -falls, -dizziness Psychology: -depressed mood, -agitation, -sleep problems     Objective:   BP 134/86   Pulse 77   Ht  (1.727 m)   Wt (!) 454 lb 9.6 oz (206.2 kg)   SpO2 97%   BMI 69.12 kg/m   BP Readings from Last 3 Encounters:  10/28/16 134/86  08/12/16 132/84  06/24/16 130/70    Wt Readings from Last 3 Encounters:  10/28/16 (!) 454 lb 9.6 oz (206.2 kg)  08/12/16 (!) 452 lb 6.4 oz (205.2 kg)  06/24/16 (!) 439 lb (199.1 kg)    General appearance: alert, no distress, WD/WN, Caucasian male Skin: scattered macules, no worrisome lesions HEENT: normocephalic, conjunctiva/corneas normal, sclerae anicteric, PERRLA, EOMi, nares patent, no discharge or erythema, pharynx normal Oral cavity: MMM, tongue normal, teeth normal Neck: supple, no lymphadenopathy, no thyromegaly, no masses, normal ROM, no bruits Chest: non tender, normal shape and expansion Heart: RRR, normal S1, S2, no murmurs Lungs: CTA bilaterally, no wheezes, rhonchi, or rales Abdomen: +bs, soft, non tender, non distended, no masses, no hepatomegaly, no splenomegaly, no bruits Back: non tender, normal ROM, no scoliosis Musculoskeletal: upper extremities non tender, no obvious deformity, normal ROM throughout, lower extremities non tender, no obvious deformity, normal ROM throughout Extremities: no edema, no cyanosis, no clubbing Pulses: 2+ symmetric,  upper and lower extremities, normal cap refill Neurological: alert, oriented x 3, CN2-12 intact, strength normal upper extremities and lower extremities, sensation normal throughout, DTRs 2+ throughout, no cerebellar signs, gait normal Psychiatric: normal affect, behavior normal, pleasant  GU: declined Rectal: deferred  Assessment and Plan :    Encounter Diagnoses  Name Primary?  . Encounter for health maintenance examination in adult Yes  . Essential hypertension   . Seasonal allergic rhinitis due to pollen   . Bilateral carpal tunnel syndrome   . DDD (degenerative disc disease), lumbar   . Spondylolisthesis of lumbar region   . Idiopathic gout, unspecified chronicity, unspecified site   . Morbid obesity (HCC)   . Family history of premature CAD   . Chronic bilateral low back pain without sciatica   . Anxiety and depression   . Need for Tdap vaccination   . Vaccine counseling   . Screen for STD (sexually transmitted disease)     Physical exam - discussed and counseled on healthy lifestyle, diet, exercise, preventative  care, vaccinations, sick and well care, proper use of emergency dept and after hours care, and addressed their concerns.    Health screening: See your eye doctor yearly for routine vision care. See your dentist yearly for routine dental care including hygiene visits twice yearly.  Discussed STD testing, discussed prevention, condom use, means of transmission  Cancer screening Discussed colonoscopy screening age 76yo unless higher risk for earlier screening Discussed PSA, prostate exam, and prostate cancer screening risks/benefits.   Discussed prostate symptoms as well.  Prostate screening performed: No  Vaccinations: Counseled on the following vaccines:  influenza and Tdap Counseled on the Tdap (tetanus, diptheria, and acellular pertussis) vaccine.  Vaccine information sheet given. Tdap vaccine given after consent obtained.  Acute issues  discussed: none  Separate significant chronic issues discussed: Obesity - referral to Prep program at [facility], discussed goal setting, reasons to get motivated.  Chronic back pain - he will be seeing Dr. Ethelene Hal soon for intervention, pain, will request records from the recent visit with rheumatology  Gout - labs today, c/t allopurinol  Depression, anxiety - c/t same medication, PHQ9 reviewed, lot of mood issues has to do with his weight, back pain and frustrations  Allergic rhintiis - c/t same medication, avoid allergen triggers  Tristen was seen today for annual exam.  Diagnoses and all orders for this visit:  Encounter for health maintenance examination in adult -     Comprehensive metabolic panel -     Lipid panel -     CBC -     TSH -     Hemoglobin A1c -     HIV antibody -     RPR -     GC/Chlamydia Probe Amp  Essential hypertension -     Comprehensive metabolic panel -     Lipid panel -     Hemoglobin A1c  Seasonal allergic rhinitis due to pollen  Bilateral carpal tunnel syndrome  DDD (degenerative disc disease), lumbar  Spondylolisthesis of lumbar region  Idiopathic gout, unspecified chronicity, unspecified site -     Uric acid  Morbid obesity (HCC) -     TSH -     Hemoglobin A1c  Family history of premature CAD  Chronic bilateral low back pain without sciatica  Anxiety and depression  Need for Tdap vaccination  Vaccine counseling  Screen for STD (sexually transmitted disease) -     HIV antibody -     RPR -     GC/Chlamydia Probe Amp  Other orders -     montelukast (SINGULAIR) 10 MG tablet; TAKE 1 TABLET(10 MG) BY MOUTH AT BEDTIME -     metoprolol succinate (TOPROL-XL) 50 MG 24 hr tablet; TAKE 1 TABLET BY MOUTH DAILY WITH OR IMMEDIATELY FOLLOWING A MEAL -     FLUoxetine (PROZAC) 40 MG capsule; TAKE 1 CAPSULE(40 MG) BY MOUTH DAILY -     cyclobenzaprine (FLEXERIL) 10 MG tablet; 1 tablet at QHS prn or 1/2-1 tablet during the day prn -      allopurinol (ZYLOPRIM) 100 MG tablet; Take 1 tablet (100 mg total) by mouth daily.   Follow-up pending labs, yearly for physical

## 2016-10-28 NOTE — Telephone Encounter (Signed)
OV note from Dr. Dierdre Forth placed in your folder for review. Trixie Rude

## 2016-10-28 NOTE — Patient Instructions (Signed)
Recommendations I'd like to refer them to Neena Rhymes, wellness nurse at [facility] for a consult about diet, exercise, wellness.  I think this would be a great opportunity and resource to help them meet wellness goals.   Providers Referral Exercise Program Rose Fillers, Wellness RN at the [facility]  [address] [place], [place] [address] [phone] [email or web address]  Check insurnce coverage for the following medication to help your efforts: Gildardo Pounds, Saxenda  Set short term goals!!!  See your eye doctor yearly for routine vision care.  See your dentist yearly for routine dental care including hygiene visits twice yearly.  We will call with lab results

## 2016-10-29 ENCOUNTER — Other Ambulatory Visit: Payer: Self-pay | Admitting: Medical

## 2016-10-29 LAB — GC/CHLAMYDIA PROBE AMP
CT PROBE, AMP APTIMA: NOT DETECTED
GC Probe RNA: NOT DETECTED

## 2016-10-29 LAB — HEMOGLOBIN A1C
Hgb A1c MFr Bld: 5.4 % (ref <5.7)
MEAN PLASMA GLUCOSE: 108 mg/dL

## 2016-10-29 LAB — HIV ANTIBODY (ROUTINE TESTING W REFLEX): HIV: NONREACTIVE

## 2016-10-29 LAB — URIC ACID: URIC ACID, SERUM: 7.9 mg/dL (ref 4.0–8.0)

## 2016-10-30 LAB — RPR

## 2016-11-07 ENCOUNTER — Telehealth: Payer: Self-pay

## 2016-11-07 NOTE — Telephone Encounter (Signed)
Second VM left at pt's cell number regarding referral for PREP.  First VM left on 10/31/16.  Will try again next week if he doesn't call me back.

## 2017-04-14 ENCOUNTER — Encounter: Payer: Self-pay | Admitting: Medical

## 2017-04-14 ENCOUNTER — Ambulatory Visit (INDEPENDENT_AMBULATORY_CARE_PROVIDER_SITE_OTHER): Payer: 59 | Admitting: Medical

## 2017-04-14 VITALS — BP 150/88 | HR 78 | Wt >= 6400 oz

## 2017-04-14 DIAGNOSIS — G478 Other sleep disorders: Secondary | ICD-10-CM | POA: Diagnosis not present

## 2017-04-14 DIAGNOSIS — R400 Somnolence: Secondary | ICD-10-CM

## 2017-04-14 DIAGNOSIS — Z23 Encounter for immunization: Secondary | ICD-10-CM | POA: Diagnosis not present

## 2017-04-14 DIAGNOSIS — M25532 Pain in left wrist: Secondary | ICD-10-CM | POA: Diagnosis not present

## 2017-04-14 DIAGNOSIS — R5382 Chronic fatigue, unspecified: Secondary | ICD-10-CM | POA: Diagnosis not present

## 2017-04-14 NOTE — Progress Notes (Signed)
Subjective: Chief Complaint  Patient presents with  . discuss  having asleep study    discuss having a sleep study , left wrist pain x1 month    Has had sleep issues in general for years, but lately noticing he drools in the night or wakes up with dry mouth at times.   He notes some recent bed wetting in the past few weeks.  Hasn't had this before.  Still reports fatigue.   Lives alone.  No witnessed apnea.   Does get headaches, wakes with mild headaches many mornings.   Not sure if he snores.   Has daytime somnolence and non restful sleep.  No prior sleep study.  Paternal uncle is on CPAP.   Father is a bad snore.   Here for flu shot  Left wrists pain ongoing x 1 months.   Larey Seat twice about a month ago.   Landed on wrist, couldn't bend left wrist for several days.  Still has some pain in left wrist.      Past Medical History:  Diagnosis Date  . Allergy   . Anxiety   . Bilateral chronic knee pain   . Depression   . Dermatographism   . Gout   . Hypertension   . Obesity   . Wears glasses    Current Outpatient Prescriptions on File Prior to Visit  Medication Sig Dispense Refill  . allopurinol (ZYLOPRIM) 100 MG tablet Take 1 tablet (100 mg total) by mouth daily. 90 tablet 3  . Coenzyme Q10 (CO Q 10 PO) Take by mouth.    . cyclobenzaprine (FLEXERIL) 10 MG tablet 1 tablet at QHS prn or 1/2-1 tablet during the day prn 30 tablet 3  . Flaxseed, Linseed, (FLAX SEED OIL PO) Take by mouth.    Marland Kitchen FLUoxetine (PROZAC) 40 MG capsule TAKE 1 CAPSULE(40 MG) BY MOUTH DAILY 90 capsule 1  . metoprolol succinate (TOPROL-XL) 50 MG 24 hr tablet TAKE 1 TABLET BY MOUTH DAILY WITH OR IMMEDIATELY FOLLOWING A MEAL 90 tablet 3  . montelukast (SINGULAIR) 10 MG tablet TAKE 1 TABLET(10 MG) BY MOUTH AT BEDTIME 90 tablet 3  . Multiple Vitamins-Minerals (MULTIVITAMIN WITH MINERALS) tablet Take 1 tablet by mouth daily.    . Omega-3 Fatty Acids (FISH OIL) 1000 MG CAPS Take by mouth.    . B Complex-C (PROBEC-T PO) Take by  mouth.     No current facility-administered medications on file prior to visit.    ROS as in subjective    Objective: BP (!) 150/88   Pulse 78   Wt (!) 434 lb 9.6 oz (197.1 kg)   SpO2 96%   BMI 66.08 kg/m   Wt Readings from Last 3 Encounters:  04/14/17 (!) 434 lb 9.6 oz (197.1 kg)  10/28/16 (!) 454 lb 9.6 oz (206.2 kg)  08/12/16 (!) 452 lb 6.4 oz (205.2 kg)   BP Readings from Last 3 Encounters:  04/14/17 (!) 150/88  10/28/16 134/86  08/12/16 132/84   Gen: morbidly obese white male Tender over left wrist medial and lateral wrist, mild pain with ROM which is 90% of normal ROM, pain with resisted wrist flexion and extension.  otherwise hand and arm nontender, no swelling , no deformity Neck - large diameter Lungs clear Heart rrr, normal s1, s2, no murmurs Ext; no edema    Assessment: Encounter Diagnoses  Name Primary?  . Non-restorative sleep Yes  . Morbid obesity (HCC)   . Daytime somnolence   . Chronic fatigue   .  Left wrist pain   . Need for influenza vaccination     Plan: Given symptoms, obesity, HTN, headaches - refer for sleep study and refer to weight management clinic as we have not had success with medications or motivating him to make serious attempts to address his weight.   He doesn't seem to be motivated yet every year we have had to address thinks like adding BP medication, addressing knee pains, back pains, and now some recent falls all in part related to his obesity  Left wrist pain - send for xray.  Counseled on the influenza virus vaccine.  Vaccine information sheet given.  Influenza vaccine given after consent obtained.   Ayeden was seen today for discuss  having asleep study.  Diagnoses and all orders for this visit:  Non-restorative sleep  Morbid obesity (HCC) -     Amb Ref to Medical Weight Management  Daytime somnolence  Chronic fatigue  Left wrist pain -     DG Wrist Complete Left; Future  Need for influenza vaccination -      Flu Vaccine QUAD 36+ mos IM

## 2017-04-17 ENCOUNTER — Ambulatory Visit
Admission: RE | Admit: 2017-04-17 | Discharge: 2017-04-17 | Disposition: A | Discharge status: Home / Self Care | Payer: 59 | Source: Ambulatory Visit | Attending: Medical | Admitting: Medical

## 2017-04-17 DIAGNOSIS — M25532 Pain in left wrist: Secondary | ICD-10-CM

## 2017-04-22 ENCOUNTER — Telehealth: Payer: Self-pay | Admitting: Medical

## 2017-04-22 NOTE — Telephone Encounter (Signed)
Spoke with pt he said that he received a letter from [employer] about this. He was told by the Pharmacy that his wasn't part of the recall.

## 2017-04-22 NOTE — Telephone Encounter (Signed)
Please call patient about a recall on Singulair/Montelukast.    We received notice from their insurer about a potential recall on "specific" lots of Singulair.   Please ask them to contact their pharmacy to see if their particular medication was part of the recall.   If their lot of medication was not recalled, then it should still be safe to take.  (The recall involved a different medication being put in the bottle during the packaging of the product, Losartan, a blood pressure pill) 

## 2017-04-27 ENCOUNTER — Telehealth: Payer: Self-pay | Admitting: Medical

## 2017-04-27 NOTE — Telephone Encounter (Signed)
Called and spoke with pt about his referral to sleep  Study and pt is not able to get into the weight loss clinic until the first part of the year an wants to know what to do until that time.

## 2017-04-27 NOTE — Addendum Note (Signed)
Addended by: Winn Jock on: 04/27/2017 03:26 PM   Modules accepted: Orders

## 2017-04-27 NOTE — Telephone Encounter (Signed)
Lets get the sleep study done , then we can decide on other options.  Why did the weight loss clinic say the appt time would be so far out?

## 2017-04-27 NOTE — Telephone Encounter (Signed)
Pt has not heard anything from sleep clinic. Weight management clinic called him however he will not be able to be seen until after the first of the year.  If we call the pt back today, he can be reached at his work # at [phone]

## 2017-04-28 NOTE — Telephone Encounter (Signed)
They are booked out because of referral volume.

## 2017-05-18 ENCOUNTER — Ambulatory Visit: Payer: 59 | Admitting: Medical

## 2017-05-18 ENCOUNTER — Encounter: Payer: Self-pay | Admitting: Medical

## 2017-05-18 ENCOUNTER — Telehealth: Payer: Self-pay | Admitting: Medical

## 2017-05-18 VITALS — BP 144/88 | HR 93 | Temp 99.1°F | Wt >= 6400 oz

## 2017-05-18 DIAGNOSIS — J988 Other specified respiratory disorders: Secondary | ICD-10-CM

## 2017-05-18 DIAGNOSIS — R05 Cough: Secondary | ICD-10-CM

## 2017-05-18 DIAGNOSIS — R059 Cough, unspecified: Secondary | ICD-10-CM

## 2017-05-18 MED ORDER — HYDROCODONE-HOMATROPINE 5-1.5 MG/5ML PO SYRP: 5.0000 mL | ORAL_SOLUTION | Freq: Three times a day (TID) | ORAL | 0 refills | Status: DC | PRN

## 2017-05-18 MED ORDER — AMOXICILLIN 875 MG PO TABS: 875.0000 mg | ORAL_TABLET | Freq: Two times a day (BID) | ORAL | 0 refills | Status: DC

## 2017-05-18 NOTE — Telephone Encounter (Signed)
Called and spoke with the sleep lab, she said that they waiting on uhc to approved his sleep study and she said that she will call uhc back today to see if they are going to approve his sleep study  And will call us back. Call and notified the pt of this.

## 2017-05-18 NOTE — Progress Notes (Signed)
Subjective: Chief Complaint  Patient presents with  . congestion    congestion, coughing , chills, running nose    Here for 1.5 week hx/o cough, chills, congestion, feels worse in chest, some yellowish productive sputum.  No fever, no hemoptysis.  Worsening at this point particularly the cough.  No sore throat.  Has had some body aches.  Has had some chills.   No sick contacts.  Using Aleve and different OTC medications such as sudafed, delsym.  No urinary symptoms.  No other aggravating or relieving factors. No other complaint.   Past Medical History:  Diagnosis Date  . Allergy   . Anxiety   . Bilateral chronic knee pain   . Depression   . Dermatographism   . Gout   . Hypertension   . Obesity   . Wears glasses    Current Outpatient Medications on File Prior to Visit  Medication Sig Dispense Refill  . allopurinol (ZYLOPRIM) 100 MG tablet Take 1 tablet (100 mg total) by mouth daily. 90 tablet 3  . B Complex-C (PROBEC-T PO) Take by mouth.    . Coenzyme Q10 (CO Q 10 PO) Take by mouth.    . cyclobenzaprine (FLEXERIL) 10 MG tablet 1 tablet at QHS prn or 1/2-1 tablet during the day prn 30 tablet 3  . Flaxseed, Linseed, (FLAX SEED OIL PO) Take by mouth.    Marland Kitchen FLUoxetine (PROZAC) 40 MG capsule TAKE 1 CAPSULE(40 MG) BY MOUTH DAILY 90 capsule 1  . metoprolol succinate (TOPROL-XL) 50 MG 24 hr tablet TAKE 1 TABLET BY MOUTH DAILY WITH OR IMMEDIATELY FOLLOWING A MEAL 90 tablet 3  . montelukast (SINGULAIR) 10 MG tablet TAKE 1 TABLET(10 MG) BY MOUTH AT BEDTIME 90 tablet 3  . Multiple Vitamins-Minerals (MULTIVITAMIN WITH MINERALS) tablet Take 1 tablet by mouth daily.    . Omega-3 Fatty Acids (FISH OIL) 1000 MG CAPS Take by mouth.     No current facility-administered medications on file prior to visit.    ROS as in subjective   Objective: BP (!) 144/88   Pulse 93   Temp 99.1 F (37.3 C)   Wt (!) 465 lb (210.9 kg)   SpO2 96%   BMI 70.70 kg/m   General appearance: Alert, WD/WN, no  distress                             Skin: warm, no rash                           Head: no sinus tenderness,                            Eyes: conjunctiva normal, corneas clear, PERRLA                            Ears: pearly TMs, external ear canals normal                          Nose: septum midline, turbinates swollen, with erythema and clear discharge             Mouth/throat: MMM, tongue normal, no pharyngeal erythema                           Neck: supple,  no adenopathy, no thyromegaly, non tender                          Heart: RRR, normal S1, S2, no murmurs                         Lungs: somewhat bronchial sounds, otherwise, no wheezes, rales, or rhonchi        Assessment: Encounter Diagnoses  Name Primary?  Marland Kitchen. Respiratory tract infection Yes  . Cough        Plan Discussed treatment and recommendations for respiratory tract infection.   Begin medications below, rest, hydrate well.  If not much improved or worse within next 3-4 days, then call or recheck.   Harrold Donathathan was seen today for congestion.  Diagnoses and all orders for this visit:  Respiratory tract infection  Cough  Other orders -     HYDROcodone-homatropine (HYCODAN) 5-1.5 MG/5ML syrup; Take 5 mLs every 8 (eight) hours as needed by mouth for cough. -     amoxicillin (AMOXIL) 875 MG tablet; Take 1 tablet (875 mg total) 2 (two) times daily by mouth.

## 2017-05-18 NOTE — Telephone Encounter (Signed)
Tarsha Patient still has NOT heard from sleep study order.  This is third time we have discussed this, but yet he has not heard from the sleep office.   Please check on this.   Give him the contact info so he can call them as well.

## 2017-06-20 ENCOUNTER — Other Ambulatory Visit: Payer: Self-pay | Admitting: Medical

## 2017-06-23 NOTE — Telephone Encounter (Signed)
Can pt have a refill on meds  

## 2017-07-18 ENCOUNTER — Encounter (HOSPITAL_BASED_OUTPATIENT_CLINIC_OR_DEPARTMENT_OTHER): Payer: 59

## 2017-08-08 ENCOUNTER — Ambulatory Visit (HOSPITAL_BASED_OUTPATIENT_CLINIC_OR_DEPARTMENT_OTHER): Payer: 59 | Attending: Medical | Admitting: Internal Medicine

## 2017-08-08 VITALS — Ht 68.0 in | Wt >= 6400 oz

## 2017-08-08 DIAGNOSIS — R400 Somnolence: Secondary | ICD-10-CM

## 2017-08-08 DIAGNOSIS — G4733 Obstructive sleep apnea (adult) (pediatric): Secondary | ICD-10-CM | POA: Insufficient documentation

## 2017-08-08 DIAGNOSIS — R0683 Snoring: Secondary | ICD-10-CM | POA: Diagnosis not present

## 2017-08-08 DIAGNOSIS — Z79899 Other long term (current) drug therapy: Secondary | ICD-10-CM | POA: Diagnosis not present

## 2017-08-08 DIAGNOSIS — G4719 Other hypersomnia: Secondary | ICD-10-CM | POA: Insufficient documentation

## 2017-08-15 DIAGNOSIS — R400 Somnolence: Secondary | ICD-10-CM

## 2017-08-15 NOTE — Procedures (Signed)
Patient Name: Patrick Baxter, Patrick Baxter Date: 08/08/2017 Gender: Male D.O.B: May 27, 1971 Age (years): 46 Referring Provider: Chana Bode Height (inches): 92 Interpreting Physician: Baird Lyons MD, ABSM Weight (lbs): 465 RPSGT: Earney [place] BMI: 71 MRN: [id number] Neck Size: 22.00 <br> <br> CLINICAL INFORMATION Sleep Study Type: Split Night CPAP  Indication for sleep study: Excessive Daytime Sleepiness  Epworth Sleepiness Score: 8  SLEEP STUDY TECHNIQUE As per the AASM Manual for the Scoring of Sleep and Associated Events v2.3 (April 2016) with a hypopnea requiring 4% desaturations.  The channels recorded and monitored were frontal, central and occipital EEG, electrooculogram (EOG), submentalis EMG (chin), nasal and oral airflow, thoracic and abdominal wall motion, anterior tibialis EMG, snore microphone, electrocardiogram, and pulse oximetry. Continuous positive airway pressure (CPAP) was initiated when the patient met split night criteria and was titrated according to treat sleep-disordered breathing.  MEDICATIONS Medications self-administered by patient taken the night of the study : MELATONIN  RESPIRATORY PARAMETERS Diagnostic  Total AHI (/hr): 103.9 RDI (/hr): 109.1 OA Index (/hr): 46.1 CA Index (/hr): 0.0 REM AHI (/hr): N/A NREM AHI (/hr): 103.9 Supine AHI (/hr): 109.6 Non-supine AHI (/hr): 86.96 Min O2 Sat (%): 0.00 Mean O2 (%): 90.13 Time below 88% (min): 43.5   Titration  Optimal Pressure (cm):           13 AHI at Optimal Pressure (/hr): 3.3 Min O2 at Optimal Pressure (%): 89 Supine % at Optimal (%): N/A Sleep % at Optimal (%): N/A   SLEEP ARCHITECTURE The recording time for the entire night was 367.9 minutes.  During a baseline period of 177.1 minutes, the patient slept for 138.0 minutes in REM and nonREM, yielding a sleep efficiency of 77.9%. Sleep onset after lights out was 21.8 minutes with a REM latency of N/A minutes. The patient spent 2.90% of the  night in stage N1 sleep, 97.10% in stage N2 sleep, 0.00% in stage N3 and 0.00% in REM.  During the titration period of 186.0 minutes, the patient slept for 146.5 minutes in REM and nonREM, yielding a sleep efficiency of 78.7%. Sleep onset after CPAP initiation was 28.4 minutes with a REM latency of 43.0 minutes. The patient spent 2.05% of the night in stage N1 sleep, 79.52% in stage N2 sleep, 0.00% in stage N3 and 18.43% in REM.  CARDIAC DATA The 2 lead EKG demonstrated sinus rhythm. The mean heart rate was 72.45 beats per minute. Other EKG findings include: None.  LEG MOVEMENT DATA The total Periodic Limb Movements of Sleep (PLMS) were 178. The PLMS index was 37.54 .  IMPRESSIONS - Severe obstructive sleep apnea occurred during the diagnostic portion of the study (AHI = 103.9/hour). An optimal PAP pressure was determined 13 cwp. - No significant central sleep apnea occurred during the diagnostic portion of the study (CAI = 0.0/hour). - Oxygen desaturation was noted during the diagnostic portion of the study (Mean O2 = 90.1%). - The patient snored with loud snoring volume during the diagnostic portion of the study. - No cardiac abnormalities were noted during this study. - Clinically significant periodic limb movements did not occur during sleep.  DIAGNOSIS - Obstructive Sleep Apnea (327.23 [G47.33 ICD-10])  RECOMMENDATIONS - Recommend trial CPAP 13 cwp. Patient used a small F&P Simplus full face mask with M/L headgear and heated humidity. - Be careful with alcohol, sedatives and other CNS depressants that may worsen sleep apnea and disrupt normal sleep architecture. - Sleep hygiene should be reviewed to assess factors that may improve sleep quality. -  Weight management and regular exercise should be initiated or continued.  [Electronically signed] 08/15/2017 04:11 PM  Baird Lyons MD, [place], American Board of Sleep Medicine   NPI: [id number]                            [place], [place] [facility]  ELECTRONICALLY SIGNED ON:  08/15/2017, 4:05 PM [place] PH: [phone]   FX: [phone] [place]

## 2017-08-27 ENCOUNTER — Ambulatory Visit: Payer: 59 | Admitting: Medical

## 2017-08-27 ENCOUNTER — Encounter: Payer: Self-pay | Admitting: Medical

## 2017-08-27 VITALS — BP 142/80 | HR 78 | Wt >= 6400 oz

## 2017-08-27 DIAGNOSIS — G4733 Obstructive sleep apnea (adult) (pediatric): Secondary | ICD-10-CM | POA: Diagnosis not present

## 2017-08-27 DIAGNOSIS — G8929 Other chronic pain: Secondary | ICD-10-CM | POA: Diagnosis not present

## 2017-08-27 DIAGNOSIS — M25561 Pain in right knee: Secondary | ICD-10-CM | POA: Diagnosis not present

## 2017-08-27 DIAGNOSIS — M545 Low back pain: Secondary | ICD-10-CM | POA: Diagnosis not present

## 2017-08-27 DIAGNOSIS — I10 Essential (primary) hypertension: Secondary | ICD-10-CM | POA: Diagnosis not present

## 2017-08-27 DIAGNOSIS — M25562 Pain in left knee: Secondary | ICD-10-CM

## 2017-08-27 NOTE — Progress Notes (Signed)
Subjective:   Patrick Baxter is a 47 y.o. male presenting on 08/27/2017 with discuss sleep study (discuss sleep study )  Here to discuss his sleep study results.  He was titrated while there told he had sleep apnea and he notes that he actually slept really well rested that evening.  He notes that we referred him to weight loss clinic this past fall, and family has an appointment coming up with  Weight management clinic 09/03/17  Hypertension-compliant with medication  No other aggravating or relieving factors.  No other complaint.  Review of Systems ROS as in subjective  Past Medical History:  Diagnosis Date  . Allergy   . Anxiety   . Bilateral chronic knee pain   . Depression   . Dermatographism   . Gout   . Hypertension   . Obesity   . Wears glasses    Current Outpatient Medications on File Prior to Visit  Medication Sig Dispense Refill  . allopurinol (ZYLOPRIM) 100 MG tablet Take 1 tablet (100 mg total) by mouth daily. 90 tablet 3  . Coenzyme Q10 (CO Q 10 PO) Take by mouth.    . cyclobenzaprine (FLEXERIL) 10 MG tablet 1 tablet at QHS prn or 1/2-1 tablet during the day prn 30 tablet 3  . FLUoxetine (PROZAC) 40 MG capsule TAKE 1 CAPSULE(40 MG) BY MOUTH DAILY 90 capsule 0  . metoprolol succinate (TOPROL-XL) 50 MG 24 hr tablet TAKE 1 TABLET BY MOUTH DAILY WITH OR IMMEDIATELY FOLLOWING A MEAL 90 tablet 3  . montelukast (SINGULAIR) 10 MG tablet TAKE 1 TABLET(10 MG) BY MOUTH AT BEDTIME 90 tablet 3  . Multiple Vitamins-Minerals (MULTIVITAMIN WITH MINERALS) tablet Take 1 tablet by mouth daily.    . Omega-3 Fatty Acids (FISH OIL) 1000 MG CAPS Take by mouth.    . B Complex-C (PROBEC-T PO) Take by mouth.     No current facility-administered medications on file prior to visit.    ROS as in subjective      Objective: BP (!) 142/80   Pulse 78   Wt (!) 468 lb 9.6 oz (212.6 kg)   SpO2 96%   BMI 71.25 kg/m   Wt Readings from Last 3 Encounters:  08/27/17 (!) 468 lb 9.6 oz  (212.6 kg)  08/08/17 (!) 465 lb (210.9 kg)  05/18/17 (!) 465 lb (210.9 kg)   BP Readings from Last 3 Encounters:  08/27/17 (!) 142/80  05/18/17 (!) 144/88  04/14/17 (!) 150/88    General appearance: alert, no distress, WD/WN Heart: RRR, normal S1, S2, no murmurs Lungs: CTA bilaterally, no wheezes, rhonchi, or rales Ext: no edema Pulses: 2+ symmetric, upper and lower extremities, normal cap refill      Assessment: Encounter Diagnoses  Name Primary?  . OSA (obstructive sleep apnea) Yes  . Essential hypertension   . Morbid obesity (HCC)   . Chronic bilateral low back pain without sciatica   . Chronic pain of both knees      Plan: We discussed recommendations including beginning a trial of CPAP, and referral made for CPAP supplies, work on losing weight through healthy diet and exercise, consider elevating head of bed, avoid alcohol and sedative medications in the evening close to bedtime.   Answered his questions.  Follow up in 1 month after starting CPAP.  HTN - c/t same medication  Obesity - counseled on efforts at weight loss, diet, exercise  Chronic knee and back pain - c/t efforts at exercise, diet, weight loss.   He  has seen ortho in last year regarding these complaints  Patrick Baxter was seen today for discuss sleep study.  Diagnoses and all orders for this visit:  OSA (obstructive sleep apnea)  Essential hypertension  Morbid obesity (HCC)  Chronic bilateral low back pain without sciatica  Chronic pain of both knees   Return 31mo.

## 2017-08-28 NOTE — Addendum Note (Signed)
Addended by: Herminio CommonsJOHNSON, SABRINA A on: 08/28/2017 01:29 PM   Modules accepted: Orders

## 2017-09-01 ENCOUNTER — Institutional Professional Consult (permissible substitution): Payer: 59 | Admitting: Medical

## 2017-09-03 ENCOUNTER — Encounter (INDEPENDENT_AMBULATORY_CARE_PROVIDER_SITE_OTHER): Payer: 59

## 2017-09-07 ENCOUNTER — Telehealth: Payer: Self-pay | Admitting: Medical

## 2017-09-07 NOTE — Telephone Encounter (Signed)
Pt was notified to contact insurance about billing for sleep study

## 2017-09-07 NOTE — Telephone Encounter (Signed)
New Message  Pt verbalized he had issues with his sleep study and would like to speak to nurse.

## 2017-09-15 ENCOUNTER — Ambulatory Visit (INDEPENDENT_AMBULATORY_CARE_PROVIDER_SITE_OTHER): Payer: 59 | Admitting: Family Medicine

## 2017-09-15 ENCOUNTER — Encounter (INDEPENDENT_AMBULATORY_CARE_PROVIDER_SITE_OTHER): Payer: Self-pay | Admitting: Family Medicine

## 2017-09-15 VITALS — BP 158/82 | HR 68 | Temp 97.6°F | Ht 68.0 in | Wt >= 6400 oz

## 2017-09-15 DIAGNOSIS — R5383 Other fatigue: Secondary | ICD-10-CM | POA: Diagnosis not present

## 2017-09-15 DIAGNOSIS — R0602 Shortness of breath: Secondary | ICD-10-CM

## 2017-09-15 DIAGNOSIS — Z9189 Other specified personal risk factors, not elsewhere classified: Secondary | ICD-10-CM

## 2017-09-15 DIAGNOSIS — Z6841 Body Mass Index (BMI) 40.0 and over, adult: Secondary | ICD-10-CM

## 2017-09-15 DIAGNOSIS — Z0289 Encounter for other administrative examinations: Secondary | ICD-10-CM

## 2017-09-15 DIAGNOSIS — E559 Vitamin D deficiency, unspecified: Secondary | ICD-10-CM

## 2017-09-15 DIAGNOSIS — E66813 Obesity, class 3: Secondary | ICD-10-CM

## 2017-09-15 DIAGNOSIS — I10 Essential (primary) hypertension: Secondary | ICD-10-CM | POA: Diagnosis not present

## 2017-09-15 DIAGNOSIS — Z1331 Encounter for screening for depression: Secondary | ICD-10-CM | POA: Diagnosis not present

## 2017-09-15 DIAGNOSIS — B372 Candidiasis of skin and nail: Secondary | ICD-10-CM

## 2017-09-15 NOTE — Progress Notes (Signed)
Office: [phone]  /  Fax: [phone]   Dear Kermit Balo. Tysinger, PA-C,   Thank you for referring Yahir Tavano to our clinic. The following note includes my evaluation and treatment recommendations.  HPI:   Chief Complaint: OBESITY    Starr Engel has been referred by Kermit Balo. Tysinger, PA-C for consultation regarding his obesity and obesity related comorbidities.    Efton Thomley (MR# [id number]) is a 47 y.o. male who presents on 09/15/2017 for obesity evaluation and treatment. Current BMI is Body mass index is 71.01 kg/m.Marland Kitchen Agamjot has been struggling with his weight for many years and has been unsuccessful in either losing weight, maintaining weight loss, or reaching his healthy weight goal.     Demosthenes attended our information session and states he is currently in the action stage of change and ready to dedicate time achieving and maintaining a healthier weight. Everrett is interested in becoming our patient and working on intensive lifestyle modifications including (but not limited to) diet, exercise and weight loss.    Vinton states he struggles with family and or coworkers weight loss sabotage his desired weight loss is 193 to 218 lbs he has been heavy most of  his life his heaviest weight ever was 467 lbs. he is frequently drinking liquids with calories he frequently makes poor food choices he frequently eats larger portions than normal  he has binge eating behaviors he struggles with emotional eating    Fatigue Tavian feels his energy is lower than it should be. This has worsened with weight gain and has not worsened recently. Dakwon admits to daytime somnolence and admits to waking up still tired. Patient is at risk for obstructive sleep apnea. Patent has a history of symptoms of daytime fatigue, morning fatigue, morning headache and hypertension. Patient generally gets 5 hours of sleep per night, and states they generally have restless sleep. Elijahjames is unsure if snoring is  present. Apneic episodes are present. Epworth Sleepiness Score is 6 EKG today with right bundle branch block (new since 2017).  Dyspnea on exertion Kasson notes increasing shortness of breath with exercising and seems to be worsening over time with weight gain. He notes getting out of breath sooner with activity than he used to. This has not gotten worse recently. EKG today with right bundle branch block (new since 2017). King denies orthopnea.  Hypertension Aveer Bartow is a 47 y.o. male with hypertension and is on metoprolol. His blood pressure is elevated today at 158/82  [name] denies chest pain, chest pressure or headache. He is attempting to work on weight loss to help control his blood pressure with the goal of decreasing his risk of heart attack and stroke. Nathans blood pressure is not currently controlled.  At risk for cardiovascular disease Rudolph is at a higher than average risk for cardiovascular disease due to obesity and hypertension. He currently denies any chest pain.  Vitamin D deficiency Demetry has a likely diagnosis of vitamin D deficiency due to obesity. He is not currently taking vit D and denies nausea, vomiting or muscle weakness.  Candida Intertrigo Zyshonne has a diagnosis of candida intertrigo on posterior right knee.  Depression Screen Argelio's Food and Mood (modified PHQ-9) score was  Depression screen PHQ 2/9 09/15/2017  Decreased Interest 3  Down, Depressed, Hopeless 3  PHQ - 2 Score 6  Altered sleeping 3  Tired, decreased energy 3  Change in appetite 3  Feeling bad or failure about yourself  1  Trouble concentrating 1  Moving slowly or fidgety/restless 3  Suicidal thoughts 1  PHQ-9 Score 21  Difficult doing work/chores Extremely dIfficult    ALLERGIES: No Known Allergies  MEDICATIONS: Current Outpatient Medications on File Prior to Visit  Medication Sig Dispense Refill  . allopurinol (ZYLOPRIM) 100 MG tablet Take 1 tablet (100 mg total)  by mouth daily. 90 tablet 3  . B Complex-C (PROBEC-T PO) Take by mouth.    . Calcium-Magnesium-Zinc 167-83-8 MG TABS Take 1 tablet by mouth daily.    . Coenzyme Q10 (CO Q 10 PO) Take by mouth.    . cyclobenzaprine (FLEXERIL) 10 MG tablet 1 tablet at QHS prn or 1/2-1 tablet during the day prn 30 tablet 3  . FLUoxetine (PROZAC) 40 MG capsule TAKE 1 CAPSULE(40 MG) BY MOUTH DAILY 90 capsule 0  . Hyaluronic Acid 20-60 MG CAPS Take 120 mg by mouth.    . metoprolol succinate (TOPROL-XL) 50 MG 24 hr tablet TAKE 1 TABLET BY MOUTH DAILY WITH OR IMMEDIATELY FOLLOWING A MEAL 90 tablet 3  . montelukast (SINGULAIR) 10 MG tablet TAKE 1 TABLET(10 MG) BY MOUTH AT BEDTIME 90 tablet 3  . Multiple Vitamins-Minerals (MULTIVITAMIN WITH MINERALS) tablet Take 1 tablet by mouth daily.    . naproxen (NAPROSYN) 250 MG tablet Take by mouth 3 (three) times daily with meals.    . Omega-3 Fatty Acids (SALMON OIL-1000) 200 MG CAPS Take by mouth.    . Spirulina 500 MG TABS Take 1 tablet by mouth 2 (two) times daily.     No current facility-administered medications on file prior to visit.     PAST MEDICAL HISTORY: Past Medical History:  Diagnosis Date  . Allergy   . Anxiety   . Back pain   . Bilateral chronic knee pain   . Chronic headaches   . Depression   . Dermatographism   . Dry skin   . Fatigue   . Frequent urination   . Gout   . Hypertension   . Joint pain   . Knee pain   . Obesity   . Sleep apnea   . Stress   . Wears glasses     PAST SURGICAL HISTORY: Past Surgical History:  Procedure Laterality Date  . KNEE SURGERY  11/2006   right knee arthroscopy and meniscal repair, Dr. Conni ElliotLaw    SOCIAL HISTORY: Social History   Tobacco Use  . Smoking status: Former Games developermoker  . Smokeless tobacco: Never Used  . Tobacco comment: socially prior, not daily smoker  Substance Use Topics  . Alcohol use: Yes    Alcohol/week: 1.8 oz    Types: 3 Cans of beer per week  . Drug use: No    FAMILY HISTORY: Family  History  Problem Relation Age of Onset  . Alzheimer's disease Mother   . Heart disease Mother        5050, angioplasty, CAD  . Cancer Mother        breast  . Stroke Mother   . Hypertension Mother   . Hyperlipidemia Mother   . Heart disease Father        angina  . Arthritis Father   . Cancer Maternal Grandmother        leukemia  . Diabetes Paternal Grandmother     ROS: Review of Systems  Constitutional: Positive for malaise/fatigue.  Eyes:       Wear Glasses or Contacts  Respiratory: Positive for shortness of breath (on exertion).   Cardiovascular: Negative for chest pain and orthopnea.  Negative for chest pressure  Gastrointestinal: Negative for nausea and vomiting.  Genitourinary: Positive for frequency.  Musculoskeletal: Positive for back pain.       Negative for muscle weakness Muscle or Joint Pain  Skin: Positive for itching.       Dryness   Neurological: Positive for headaches.  Psychiatric/Behavioral: Positive for depression. The patient has insomnia.        Stress    PHYSICAL EXAM: Blood pressure (!) 158/82, pulse 68, temperature 97.6 F (36.4 C), temperature source Oral, height 5\' 8"  (1.727 m), weight (!) 467 lb (211.8 kg), SpO2 97 %. Body mass index is 71.01 kg/m. Physical Exam  Constitutional: He is oriented to person, place, and time. He appears well-developed and well-nourished.  HENT:  Head: Normocephalic and atraumatic.  Nose: Nose normal.  Eyes: EOM are normal. No scleral icterus.  Neck: Normal range of motion. Neck supple. No thyromegaly present.  Cardiovascular: Normal rate and regular rhythm.  Pulmonary/Chest: Effort normal. No respiratory distress.  Abdominal: Soft. There is no tenderness.  + obesity  Musculoskeletal: Normal range of motion.  Range of Motion normal in all 4 extremities  Neurological: He is alert and oriented to person, place, and time. Coordination normal.  Skin: Skin is warm and dry. There is erythema (posterior right  knee).  Psychiatric: He has a normal mood and affect. His behavior is normal.  Vitals reviewed.   RECENT LABS AND TESTS: BMET    Component Value Date/Time   NA 140 10/28/2016 0917   K 4.5 10/28/2016 0917   CL 103 10/28/2016 0917   CO2 25 10/28/2016 0917   GLUCOSE 98 10/28/2016 0917   BUN 21 10/28/2016 0917   CREATININE 0.87 10/28/2016 0917   CALCIUM 9.3 10/28/2016 0917   Lab Results  Component Value Date   HGBA1C 5.4 10/28/2016   No results found for: INSULIN CBC    Component Value Date/Time   WBC 7.7 10/28/2016 0917   RBC 4.78 10/28/2016 0917   HGB 14.4 10/28/2016 0917   HCT 42.8 10/28/2016 0917   PLT 170 10/28/2016 0917   MCV 89.5 10/28/2016 0917   MCH 30.1 10/28/2016 0917   MCHC 33.6 10/28/2016 0917   RDW 14.3 10/28/2016 0917   LYMPHSABS 1.2 05/28/2015 0001   MONOABS 0.8 05/28/2015 0001   EOSABS 0.1 05/28/2015 0001   BASOSABS 0.1 05/28/2015 0001   Iron/TIBC/Ferritin/ %Sat No results found for: IRON, TIBC, FERRITIN, IRONPCTSAT Lipid Panel     Component Value Date/Time   CHOL 153 10/28/2016 0917   TRIG 132 10/28/2016 0917   HDL 31 (L) 10/28/2016 0917   CHOLHDL 4.9 10/28/2016 0917   VLDL 26 10/28/2016 0917   LDLCALC 96 10/28/2016 0917   Hepatic Function Panel     Component Value Date/Time   PROT 7.1 10/28/2016 0917   ALBUMIN 3.9 10/28/2016 0917   AST 21 10/28/2016 0917   ALT 22 10/28/2016 0917   ALKPHOS 84 10/28/2016 0917   BILITOT 0.5 10/28/2016 0917      Component Value Date/Time   TSH 3.48 10/28/2016 0917   TSH 2.68 09/20/2015 0001   TSH 2.280 04/20/2014 1003    ECG  shows NSR with a rate of 82 BPM INDIRECT CALORIMETER done today shows a VO2 of 527 and a REE of 3670.  His calculated basal metabolic rate is 8295 thus his basal metabolic rate is better than expected.    ASSESSMENT AND PLAN: Other fatigue - Plan: EKG 12-Lead, Hemoglobin A1c, Insulin, random, Vitamin B12,  Folate, T3, T4, free, TSH  Shortness of breath - Plan: Hemoglobin A1c,  Insulin, random  Essential hypertension - Plan: Comprehensive metabolic panel, CBC with Differential/Platelet, Hemoglobin A1c, Insulin, random, Lipid panel, Folate  Vitamin D deficiency - Plan: VITAMIN D 25 Hydroxy (Vit-D Deficiency, Fractures)  Candidal intertrigo  At risk for heart disease  Class 3 severe obesity with serious comorbidity and body mass index (BMI) greater than or equal to 70 in adult, unspecified obesity type (HCC) - Plan: Hemoglobin A1c, Insulin, random  PLAN: Fatigue Ragan was informed that his fatigue may be related to obesity, depression or many other causes. Labs will be ordered, and in the meanwhile Leanord has agreed to work on diet, exercise and weight loss to help with fatigue. Proper sleep hygiene was discussed including the need for 7-8 hours of quality sleep each night. A sleep study was not ordered based on symptoms and Epworth score. We will order EKG, indirect calorimetry and echocardiogram.   Dyspnea on exertion Tymarion's shortness of breath appears to be obesity related and exercise induced. He has agreed to work on weight loss and gradually increase exercise to treat his exercise induced shortness of breath. If Demetrious follows our instructions and loses weight without improvement of his shortness of breath, we will plan to refer to pulmonology. We will order EKG, indirect calorimetry, echocardiogram and labs. We will monitor this condition regularly. Jestin agrees to this plan.  Hypertension We discussed sodium restriction, working on healthy weight loss, and a regular exercise program as the means to achieve improved blood pressure control. Garlin agreed with this plan and agreed to follow up as directed. We will continue to monitor his blood pressure as well as his progress with the above lifestyle modifications. He will continue metoprolol as prescribed and will watch for signs of hypotension as he continues his lifestyle modifications.  Cardiovascular risk  counseling Maggie was given extended (15 minutes) coronary artery disease prevention counseling today. He is 47 y.o. male and has risk factors for heart disease including obesity and hypertension. We discussed intensive lifestyle modifications today with an emphasis on specific weight loss instructions and strategies. Pt was also informed of the importance of increasing exercise and decreasing saturated fats to help prevent heart disease.  Vitamin D Deficiency Hiroki was informed that low vitamin D levels contributes to fatigue and are associated with obesity, breast, and colon cancer. We will check vitamin D level and he agrees to follow up for routine testing of vitamin D, at least 2-3 times per year.   Candida Intertrigo Emit is to get OTC anti-fungal and will follow up with our clinic as directed.  Depression Screen Makar had a strongly positive depression screening. Depression is commonly associated with obesity and often results in emotional eating behaviors. We will monitor this closely and work on CBT to help improve the non-hunger eating patterns. Referral to Psychology may be required if no improvement is seen as he continues in our clinic.  Obesity Trinten is currently in the action stage of change and his goal is to continue with weight loss efforts. I recommend Rameen begin the structured treatment plan as follows:  He has agreed to follow the Category 3 plan +300 calories [place] has been instructed to eventually work up to a goal of 150 minutes of combined cardio and strengthening exercise per week for weight loss and overall health benefits. We discussed the following Behavioral Modification Strategies today: planning for success, increase H2O intake, increasing lean protein intake, decrease  eating out and work on meal planning and easy cooking plans   He was informed of the importance of frequent follow up visits to maximize his success with intensive lifestyle modifications for  his multiple health conditions. He was informed we would discuss his lab results at his next visit unless there is a critical issue that needs to be addressed sooner. Javan agreed to keep his next visit at the agreed upon time to discuss these results.    OBESITY BEHAVIORAL INTERVENTION VISIT  Today's visit was # 1 out of 22.  Starting weight: 467 lbs Starting date: 09/15/17 Today's weight : 467 lbs Today's date: 09/15/2017 Total lbs lost to date: 0 (Patients must lose 7 lbs in the first 6 months to continue with counseling)   ASK: We discussed the diagnosis of obesity with [name] today and Sabino agreed to give [place] permission to discuss obesity behavioral modification therapy today.  ASSESS: Sayed has the diagnosis of obesity and his BMI today is 71.02 Naftuli is in the action stage of change   ADVISE: Kadarious was educated on the multiple health risks of obesity as well as the benefit of weight loss to improve his health. He was advised of the need for long term treatment and the importance of lifestyle modifications.  AGREE: Multiple dietary modification options and treatment options were discussed and  Carmine agreed to the above obesity treatment plan.   I, [place] Crane, am acting as transcriptionist for  Filbert Schilder, MD  I have reviewed the above documentation for accuracy and completeness, and I agree with the above. - Debbra Riding, MD

## 2017-09-16 LAB — CBC WITH DIFFERENTIAL/PLATELET
BASOS: 0 %
Basophils Absolute: 0 10*3/uL (ref 0.0–0.2)
EOS (ABSOLUTE): 0.2 10*3/uL (ref 0.0–0.4)
EOS: 3 %
HEMATOCRIT: 43.4 % (ref 37.5–51.0)
HEMOGLOBIN: 14.3 g/dL (ref 13.0–17.7)
Immature Grans (Abs): 0.1 10*3/uL (ref 0.0–0.1)
Immature Granulocytes: 1 %
LYMPHS ABS: 1.4 10*3/uL (ref 0.7–3.1)
Lymphs: 20 %
MCH: 29.7 pg (ref 26.6–33.0)
MCHC: 32.9 g/dL (ref 31.5–35.7)
MCV: 90 fL (ref 79–97)
MONOCYTES: 7 %
Monocytes Absolute: 0.5 10*3/uL (ref 0.1–0.9)
Neutrophils Absolute: 4.7 10*3/uL (ref 1.4–7.0)
Neutrophils: 69 %
Platelets: 162 10*3/uL (ref 150–379)
RBC: 4.81 x10E6/uL (ref 4.14–5.80)
RDW: 14.6 % (ref 12.3–15.4)
WBC: 6.9 10*3/uL (ref 3.4–10.8)

## 2017-09-16 LAB — COMPREHENSIVE METABOLIC PANEL
ALBUMIN: 4.3 g/dL (ref 3.5–5.5)
ALK PHOS: 91 IU/L (ref 39–117)
ALT: 22 IU/L (ref 0–44)
AST: 20 IU/L (ref 0–40)
Albumin/Globulin Ratio: 1.5 (ref 1.2–2.2)
BUN / CREAT RATIO: 22 — AB (ref 9–20)
BUN: 18 mg/dL (ref 6–24)
Bilirubin Total: 0.3 mg/dL (ref 0.0–1.2)
CALCIUM: 8.9 mg/dL (ref 8.7–10.2)
CO2: 25 mmol/L (ref 20–29)
CREATININE: 0.82 mg/dL (ref 0.76–1.27)
Chloride: 100 mmol/L (ref 96–106)
GFR calc Af Amer: 121 mL/min/{1.73_m2} (ref >59)
GFR, EST NON AFRICAN AMERICAN: 105 mL/min/{1.73_m2} (ref >59)
GLUCOSE: 105 mg/dL — AB (ref 65–99)
Globulin, Total: 2.8 g/dL (ref 1.5–4.5)
Potassium: 4.7 mmol/L (ref 3.5–5.2)
Sodium: 141 mmol/L (ref 134–144)
TOTAL PROTEIN: 7.1 g/dL (ref 6.0–8.5)

## 2017-09-16 LAB — INSULIN, RANDOM: INSULIN: 77 u[IU]/mL — ABNORMAL HIGH (ref 2.6–24.9)

## 2017-09-16 LAB — T4, FREE: FREE T4: 0.93 ng/dL (ref 0.82–1.77)

## 2017-09-16 LAB — LIPID PANEL
Chol/HDL Ratio: 4.8 ratio (ref 0.0–5.0)
Cholesterol, Total: 169 mg/dL (ref 100–199)
HDL: 35 mg/dL — AB (ref >39)
LDL Calculated: 110 mg/dL — ABNORMAL HIGH (ref 0–99)
TRIGLYCERIDES: 118 mg/dL (ref 0–149)
VLDL Cholesterol Cal: 24 mg/dL (ref 5–40)

## 2017-09-16 LAB — HEMOGLOBIN A1C
ESTIMATED AVERAGE GLUCOSE: 123 mg/dL
Hgb A1c MFr Bld: 5.9 % — ABNORMAL HIGH (ref 4.8–5.6)

## 2017-09-16 LAB — VITAMIN D 25 HYDROXY (VIT D DEFICIENCY, FRACTURES): Vit D, 25-Hydroxy: 13.2 ng/mL — ABNORMAL LOW (ref 30.0–100.0)

## 2017-09-16 LAB — FOLATE: Folate: 9 ng/mL (ref >3.0)

## 2017-09-16 LAB — VITAMIN B12: Vitamin B-12: 202 pg/mL — ABNORMAL LOW (ref 232–1245)

## 2017-09-16 LAB — TSH: TSH: 7.25 u[IU]/mL — ABNORMAL HIGH (ref 0.450–4.500)

## 2017-09-16 LAB — T3: T3 TOTAL: 139 ng/dL (ref 71–180)

## 2017-09-18 ENCOUNTER — Encounter (INDEPENDENT_AMBULATORY_CARE_PROVIDER_SITE_OTHER): Payer: Self-pay | Admitting: Family Medicine

## 2017-09-21 NOTE — Telephone Encounter (Signed)
Please call.

## 2017-09-23 ENCOUNTER — Telehealth: Payer: Self-pay | Admitting: Medical

## 2017-09-23 ENCOUNTER — Other Ambulatory Visit: Payer: Self-pay | Admitting: Medical

## 2017-09-23 NOTE — Telephone Encounter (Signed)
Patient called to check on CPAP machine process. He heard from the company that they had his paperwork but he has not heard anything else. He does not know the name of the company

## 2017-09-23 NOTE — Telephone Encounter (Signed)
Patrick Baxter, please inquire

## 2017-09-24 NOTE — Telephone Encounter (Signed)
Can pt have refill on meds  

## 2017-09-28 NOTE — Telephone Encounter (Signed)
Called and spoke to [facility]manda at [facility] and they will follow-up with pt today. I have left a detailed message on pt's vm about [facility]

## 2017-09-29 ENCOUNTER — Ambulatory Visit (HOSPITAL_COMMUNITY): Payer: 59 | Attending: Cardiology

## 2017-09-29 ENCOUNTER — Other Ambulatory Visit: Payer: Self-pay

## 2017-09-29 DIAGNOSIS — G4730 Sleep apnea, unspecified: Secondary | ICD-10-CM | POA: Insufficient documentation

## 2017-09-29 DIAGNOSIS — Z6841 Body Mass Index (BMI) 40.0 and over, adult: Secondary | ICD-10-CM | POA: Insufficient documentation

## 2017-09-29 DIAGNOSIS — R0602 Shortness of breath: Secondary | ICD-10-CM | POA: Insufficient documentation

## 2017-09-29 DIAGNOSIS — I10 Essential (primary) hypertension: Secondary | ICD-10-CM | POA: Diagnosis not present

## 2017-09-30 ENCOUNTER — Ambulatory Visit (INDEPENDENT_AMBULATORY_CARE_PROVIDER_SITE_OTHER): Payer: 59 | Admitting: Family Medicine

## 2017-10-01 ENCOUNTER — Ambulatory Visit (INDEPENDENT_AMBULATORY_CARE_PROVIDER_SITE_OTHER): Payer: 59 | Admitting: Family Medicine

## 2017-10-01 ENCOUNTER — Encounter (INDEPENDENT_AMBULATORY_CARE_PROVIDER_SITE_OTHER): Payer: Self-pay | Admitting: Family Medicine

## 2017-10-01 VITALS — BP 163/82 | HR 84 | Temp 98.5°F | Ht 68.0 in | Wt >= 6400 oz

## 2017-10-01 DIAGNOSIS — E538 Deficiency of other specified B group vitamins: Secondary | ICD-10-CM | POA: Diagnosis not present

## 2017-10-01 DIAGNOSIS — I10 Essential (primary) hypertension: Secondary | ICD-10-CM

## 2017-10-01 DIAGNOSIS — E038 Other specified hypothyroidism: Secondary | ICD-10-CM | POA: Diagnosis not present

## 2017-10-01 DIAGNOSIS — E559 Vitamin D deficiency, unspecified: Secondary | ICD-10-CM | POA: Diagnosis not present

## 2017-10-01 DIAGNOSIS — R7303 Prediabetes: Secondary | ICD-10-CM | POA: Diagnosis not present

## 2017-10-01 DIAGNOSIS — Z9189 Other specified personal risk factors, not elsewhere classified: Secondary | ICD-10-CM

## 2017-10-01 DIAGNOSIS — E66813 Obesity, class 3: Secondary | ICD-10-CM

## 2017-10-01 DIAGNOSIS — Z6841 Body Mass Index (BMI) 40.0 and over, adult: Secondary | ICD-10-CM

## 2017-10-01 MED ORDER — METFORMIN HCL 500 MG PO TABS: 500.0000 mg | ORAL_TABLET | Freq: Every day | ORAL | 0 refills | Status: DC

## 2017-10-01 MED ORDER — VITAMIN B-12 1000 MCG PO TABS: 1000.0000 ug | ORAL_TABLET | Freq: Every day | ORAL | 0 refills | Status: DC

## 2017-10-01 MED ORDER — LISINOPRIL 10 MG PO TABS: 10.0000 mg | ORAL_TABLET | Freq: Every day | ORAL | 0 refills | Status: DC

## 2017-10-01 MED ORDER — LEVOTHYROXINE SODIUM 25 MCG PO TABS: 25.0000 ug | ORAL_TABLET | Freq: Every day | ORAL | 0 refills | Status: DC

## 2017-10-01 MED ORDER — VITAMIN D (ERGOCALCIFEROL) 1.25 MG (50000 UNIT) PO CAPS: 50000.0000 [IU] | ORAL_CAPSULE | ORAL | 0 refills | Status: DC

## 2017-10-05 NOTE — Progress Notes (Signed)
Office: [phone]  /  Fax: [phone]   HPI:   Chief Complaint: OBESITY Patrick Baxter is here to discuss his progress with his obesity treatment plan. He is on the Category 3 plan +300 calories and is following his eating plan approximately 0 % of the time. He states he is exercising 0 minutes 0 times per week. Patrick Baxter has lots of life stressors. He tends to overeat with healthy snacks. Patrick Baxter has stress at work. He did not go to the store to buy food for the plan, nor did he weigh food. Patrick Baxter states following regimented meal plan is not really doable, because it's too difficult to weigh and measure food. Unsure whether patient is in the action stage of change, as he is resistant to many options given to him. His weight is (!) 463 lb (210 kg) today and has had a weight loss of 4 pounds over a period of 2 weeks since his last visit. He has lost 4 lbs since starting treatment with [place].  Vitamin D deficiency Patrick Baxter has a diagnosis of vitamin D deficiency and has a level of 13.2.. He is currently taking OTC vit D and denies nausea, vomiting or muscle weakness.  Pre-Diabetes Patrick Baxter has a diagnosis of pre-diabetes based on his elevated Hgb A1c and was informed this puts him at greater risk of developing diabetes. He is not taking metformin currently and continues to work on diet and exercise to decrease risk of diabetes. He admits hyperphagia and denies polydipsia or polyuria.  At risk for diabetes Patrick Baxter is at higher than average risk for developing diabetes due to his obesity and pre-diabetes. He currently denies polyuria or polydipsia.  Vitamin B 12 deficiency Patrick Baxter has a diagnosis of B12 insufficiency. His vitamin B12 level is less than 250. He notes fatigue.This is a new diagnosis. Patrick Baxter is not a vegetarian and does not have a previous diagnosis of pernicious anemia. He does not have a history of weight loss surgery.   Hypothyroid Patrick Baxter has a diagnosis of hypothyroidism. Previously his TSH  was within normal limits (April 2018). He is not on levothyroxine. He denies hot or cold intolerance or palpitations, but does admit to ongoing fatigue.  Hypertension Patrick Baxter is a 47 y.o. male with hypertension. His blood pressure is elevated again today on metoprolol. Patrick Baxter denies chest pain, chest pressure or headache. He is working weight loss to help control his blood pressure with the goal of decreasing his risk of heart attack and stroke. Patrick Baxter blood pressure is not currently controlled.  ALLERGIES: No Known Allergies  MEDICATIONS: Current Outpatient Medications on File Prior to Visit  Medication Sig Dispense Refill  . allopurinol (ZYLOPRIM) 100 MG tablet Take 1 tablet (100 mg total) by mouth daily. 90 tablet 3  . B Complex-C (PROBEC-T PO) Take by mouth.    . Calcium-Magnesium-Zinc 167-83-8 MG TABS Take 1 tablet by mouth daily.    . Coenzyme Q10 (CO Q 10 PO) Take by mouth.    . cyclobenzaprine (FLEXERIL) 10 MG tablet 1 tablet at QHS prn or 1/2-1 tablet during the day prn 30 tablet 3  . FLUoxetine (PROZAC) 40 MG capsule TAKE 1 CAPSULE(40 MG) BY MOUTH DAILY 90 capsule 0  . Hyaluronic Acid 20-60 MG CAPS Take 120 mg by mouth.    . metoprolol succinate (TOPROL-XL) 50 MG 24 hr tablet TAKE 1 TABLET BY MOUTH DAILY WITH OR IMMEDIATELY FOLLOWING A MEAL 90 tablet 3  . montelukast (SINGULAIR) 10 MG tablet TAKE 1 TABLET(10 MG)  BY MOUTH AT BEDTIME 90 tablet 3  . Multiple Vitamins-Minerals (MULTIVITAMIN WITH MINERALS) tablet Take 1 tablet by mouth daily.    . naproxen (NAPROSYN) 250 MG tablet Take by mouth 3 (three) times daily with meals.    . Omega-3 Fatty Acids (SALMON OIL-1000) 200 MG CAPS Take by mouth.    . Spirulina 500 MG TABS Take 1 tablet by mouth 2 (two) times daily.     No current facility-administered medications on file prior to visit.     PAST MEDICAL HISTORY: Past Medical History:  Diagnosis Date  . Allergy   . Anxiety   . Back pain   . Bilateral chronic knee  pain   . Chronic headaches   . Depression   . Dermatographism   . Dry skin   . Fatigue   . Frequent urination   . Gout   . Hypertension   . Joint pain   . Knee pain   . Obesity   . Sleep apnea   . Stress   . Wears glasses     PAST SURGICAL HISTORY: Past Surgical History:  Procedure Laterality Date  . KNEE SURGERY  11/2006   right knee arthroscopy and meniscal repair, Dr. Conni Elliot    SOCIAL HISTORY: Social History   Tobacco Use  . Smoking status: Former Games developer  . Smokeless tobacco: Never Used  . Tobacco comment: socially prior, not daily smoker  Substance Use Topics  . Alcohol use: Yes    Alcohol/week: 1.8 oz    Types: 3 Cans of beer per week  . Drug use: No    FAMILY HISTORY: Family History  Problem Relation Age of Onset  . Alzheimer's disease Mother   . Heart disease Mother        67, angioplasty, CAD  . Cancer Mother        breast  . Stroke Mother   . Hypertension Mother   . Hyperlipidemia Mother   . Heart disease Father        angina  . Arthritis Father   . Cancer Maternal Grandmother        leukemia  . Diabetes Paternal Grandmother     ROS: Review of Systems  Constitutional: Positive for malaise/fatigue and weight loss.  Respiratory: Negative for shortness of breath (on exertion).   Cardiovascular: Negative for chest pain and palpitations.  Gastrointestinal: Negative for nausea and vomiting.  Genitourinary: Negative for frequency.  Musculoskeletal:       Negative muscle weakness  Endo/Heme/Allergies: Negative for polydipsia.       Negative for Heat or Cold Intolerance positive for hyperphagia    PHYSICAL EXAM: Blood pressure (!) 163/82, pulse 84, temperature 98.5 F (36.9 C), temperature source Oral, height 5\' 8"  (1.727 m), weight (!) 463 lb (210 kg), SpO2 97 %. Body mass index is 70.4 kg/m. Physical Exam  Constitutional: He is oriented to person, place, and time. He appears well-developed and well-nourished.  Cardiovascular: Normal rate.    Pulmonary/Chest: Effort normal.  Musculoskeletal: Normal range of motion.  Neurological: He is oriented to person, place, and time.  Skin: Skin is warm and dry.  Psychiatric: He has a normal mood and affect. His behavior is normal.  Vitals reviewed.   RECENT LABS AND TESTS: BMET    Component Value Date/Time   NA 141 09/15/2017 1041   K 4.7 09/15/2017 1041   CL 100 09/15/2017 1041   CO2 25 09/15/2017 1041   GLUCOSE 105 (H) 09/15/2017 1041   GLUCOSE 98 10/28/2016  0917   BUN 18 09/15/2017 1041   CREATININE 0.82 09/15/2017 1041   CREATININE 0.87 10/28/2016 0917   CALCIUM 8.9 09/15/2017 1041   GFRNONAA 105 09/15/2017 1041   GFRAA 121 09/15/2017 1041   Lab Results  Component Value Date   HGBA1C 5.9 (H) 09/15/2017   HGBA1C 5.4 10/28/2016   HGBA1C 5.6 09/20/2015   HGBA1C 5.1 04/20/2014   Lab Results  Component Value Date   INSULIN 77.0 (H) 09/15/2017   CBC    Component Value Date/Time   WBC 6.9 09/15/2017 1041   WBC 7.7 10/28/2016 0917   RBC 4.81 09/15/2017 1041   RBC 4.78 10/28/2016 0917   HGB 14.3 09/15/2017 1041   HCT 43.4 09/15/2017 1041   PLT 162 09/15/2017 1041   MCV 90 09/15/2017 1041   MCH 29.7 09/15/2017 1041   MCH 30.1 10/28/2016 0917   MCHC 32.9 09/15/2017 1041   MCHC 33.6 10/28/2016 0917   RDW 14.6 09/15/2017 1041   LYMPHSABS 1.4 09/15/2017 1041   MONOABS 0.8 05/28/2015 0001   EOSABS 0.2 09/15/2017 1041   BASOSABS 0.0 09/15/2017 1041   Iron/TIBC/Ferritin/ %Sat No results found for: IRON, TIBC, FERRITIN, IRONPCTSAT Lipid Panel     Component Value Date/Time   CHOL 169 09/15/2017 1041   TRIG 118 09/15/2017 1041   HDL 35 (L) 09/15/2017 1041   CHOLHDL 4.8 09/15/2017 1041   CHOLHDL 4.9 10/28/2016 0917   VLDL 26 10/28/2016 0917   LDLCALC 110 (H) 09/15/2017 1041   Hepatic Function Panel     Component Value Date/Time   PROT 7.1 09/15/2017 1041   ALBUMIN 4.3 09/15/2017 1041   AST 20 09/15/2017 1041   ALT 22 09/15/2017 1041   ALKPHOS 91  09/15/2017 1041   BILITOT 0.3 09/15/2017 1041      Component Value Date/Time   TSH 7.250 (H) 09/15/2017 1041   TSH 3.48 10/28/2016 0917   TSH 2.68 09/20/2015 0001   Results for Patrick Baxter, Patrick "[name]" (MRN [id number]) as of 10/05/2017 08:12  Ref. Range 09/15/2017 10:41  Vitamin D, 25-Hydroxy Latest Ref Range: 30.0 - 100.0 ng/mL 13.2 (L)   ASSESSMENT AND PLAN: Vitamin D deficiency - Plan: Vitamin D, Ergocalciferol, (DRISDOL) 50000 units CAPS capsule  Prediabetes - Plan: metFORMIN (GLUCOPHAGE) 500 MG tablet  Vitamin B 12 deficiency - Plan: vitamin B-12 (CYANOCOBALAMIN) 1000 MCG tablet  Other specified hypothyroidism - Plan: levothyroxine (SYNTHROID, LEVOTHROID) 25 MCG tablet  Essential hypertension - Plan: lisinopril (PRINIVIL,ZESTRIL) 10 MG tablet  At risk for diabetes mellitus  Class 3 severe obesity with serious comorbidity and body mass index (BMI) greater than or equal to 70 in adult, unspecified obesity type (HCC)  PLAN:  Vitamin D Deficiency Patrick Baxter was informed that low vitamin D levels contributes to fatigue and are associated with obesity, breast, and colon cancer. He agrees to start to take prescription Vit D @50 ,000 IU every week #4 with no refills and will follow up for routine testing of vitamin D, at least 2-3 times per year. He was informed of the risk of over-replacement of vitamin D and agrees to not increase his dose unless he discusses this with us first.  Pre-Diabetes Patrick Baxter will continue to work on weight loss, exercise, and decreasing simple carbohydrates in his diet to help decrease the risk of diabetes. We dicussed metformin including benefits and risks. He was informed that eating too many simple carbohydrates or too many calories at one sitting increases the likelihood of GI side effects. Patrick Baxter agrees to start metformin  500 mg by mouth daily with breakfast #30 with no refills and  follow up with [place] as directed to monitor his progress.  Diabetes risk  counseling Patrick Baxter was given extended (30 minutes) diabetes prevention counseling today. He is 47 y.o. male and has risk factors for diabetes including obesity and pre-diabetes. We discussed intensive lifestyle modifications today with an emphasis on weight loss as well as increasing exercise and decreasing simple carbohydrates in his diet.  Vitamin B12 deficiency Patrick Baxter will work on increasing B12 rich foods in his diet. Patrick Baxter agrees to start Vitamin B12 1,000 mcg by mouth daily #30 with no refills and follow up as directed.   Hypothyroid Patrick Baxter was informed of the importance of good thyroid control to help with weight loss efforts. He was also informed that supertheraputic thyroid levels are dangerous and will not improve weight loss results. Patrick Baxter agrees to start to take levothyroxine 25 mcg by mouth daily before breakfast #30 with no refills and follow up as directed.  Hypertension We discussed sodium restriction, working on healthy weight loss, and a regular exercise program as the means to achieve improved blood pressure control. Patrick Baxter agreed with this plan and agreed to follow up as directed. We will continue to monitor his blood pressure as well as his progress with the above lifestyle modifications. He will continue metoprolol as prescribed and he has agreed to start lisinopril 10 mg by mouth daily #30 with no refills. Casanova will watch for signs of hypotension as he continues his lifestyle modifications.  Obesity Patrick Baxter is currently in the action stage of change. As such, his goal is to continue with weight loss efforts He has agreed to follow the Category 4 plan Patrick Baxter has been instructed to work up to a goal of 150 minutes of combined cardio and strengthening exercise per week for weight loss and overall health benefits. We discussed the following Behavioral Modification Strategies today: keeping healthy foods in the home, better snacking choices, planning for success, increasing lean  protein intake and work on meal planning and easy cooking plans  Kasey has agreed to follow up with our clinic in 2 weeks. He was informed of the importance of frequent follow up visits to maximize his success with intensive lifestyle modifications for his multiple health conditions.   OBESITY BEHAVIORAL INTERVENTION VISIT  Today's visit was # 2 out of 22.  Starting weight: 467 lbs Starting date: 09/15/17 Today's weight : 463 lbs Today's date: 10/01/2017 Total lbs lost to date: 4 (Patients must lose 7 lbs in the first 6 months to continue with counseling)   ASK: We discussed the diagnosis of obesity with [name] today and Erlin agreed to give [place] permission to discuss obesity behavioral modification therapy today.  ASSESS: Ajay has the diagnosis of obesity and his BMI today is 70.42 Macklin is in the action stage of change   ADVISE: Alejandro was educated on the multiple health risks of obesity as well as the benefit of weight loss to improve his health. He was advised of the need for long term treatment and the importance of lifestyle modifications.  AGREE: Multiple dietary modification options and treatment options were discussed and  Tysin agreed to the above obesity treatment plan.  I, [place] Crane, am acting as transcriptionist for Filbert Schilder, MD  I have reviewed the above documentation for accuracy and completeness, and I agree with the above. - Debbra Riding, MD

## 2017-10-08 ENCOUNTER — Encounter (INDEPENDENT_AMBULATORY_CARE_PROVIDER_SITE_OTHER): Payer: Self-pay | Admitting: Family Medicine

## 2017-10-20 ENCOUNTER — Encounter (INDEPENDENT_AMBULATORY_CARE_PROVIDER_SITE_OTHER): Payer: Self-pay

## 2017-10-20 ENCOUNTER — Ambulatory Visit (INDEPENDENT_AMBULATORY_CARE_PROVIDER_SITE_OTHER): Payer: 59 | Admitting: Family Medicine

## 2017-10-23 ENCOUNTER — Telehealth: Payer: Self-pay

## 2017-10-23 NOTE — Telephone Encounter (Signed)
I have worked with him prior and we were not getting any success.  Thus, I would continue with the treatment recommendations from the [facility].   If not still achieving success in the coming months, then I think its time for bariatric surgery consult.    Per his last office notes in chart for weight management clinic he wasn't exercising or following their eating plan.  He should first start with their recommendations.

## 2017-10-23 NOTE — Telephone Encounter (Signed)
Patient called stating he has been going to weight management and he's not happy with with his care there. He wants to know if his PCP can take over his weight management. Patient wants to do a follow up with PCP about medications weight loss is prescribing to see if he's on a good weight loss plan. Please advise patient.

## 2017-10-26 ENCOUNTER — Other Ambulatory Visit: Payer: Self-pay

## 2017-10-26 DIAGNOSIS — E038 Other specified hypothyroidism: Secondary | ICD-10-CM

## 2017-10-26 DIAGNOSIS — R7303 Prediabetes: Secondary | ICD-10-CM

## 2017-10-26 DIAGNOSIS — I10 Essential (primary) hypertension: Secondary | ICD-10-CM

## 2017-10-26 MED ORDER — LISINOPRIL 10 MG PO TABS: 10.0000 mg | ORAL_TABLET | Freq: Every day | ORAL | 0 refills | Status: DC

## 2017-10-26 MED ORDER — LEVOTHYROXINE SODIUM 25 MCG PO TABS: 25.0000 ug | ORAL_TABLET | Freq: Every day | ORAL | 0 refills | Status: DC

## 2017-10-26 MED ORDER — METOPROLOL SUCCINATE ER 50 MG PO TB24: ORAL_TABLET | ORAL | 0 refills | Status: DC

## 2017-10-26 MED ORDER — METFORMIN HCL 500 MG PO TABS: 500.0000 mg | ORAL_TABLET | Freq: Every day | ORAL | 0 refills | Status: DC

## 2017-10-26 NOTE — Telephone Encounter (Signed)
Patient wants know if the following medications can be refilled because weight management didn't give any refills. Please advise.    Patient stated he will schedule an Physical for when you return.

## 2017-10-30 ENCOUNTER — Other Ambulatory Visit: Payer: Self-pay | Admitting: Medical

## 2017-10-31 ENCOUNTER — Other Ambulatory Visit: Payer: Self-pay | Admitting: Family Medicine

## 2017-10-31 MED ORDER — MONTELUKAST SODIUM 10 MG PO TABS: ORAL_TABLET | ORAL | 3 refills | Status: DC

## 2017-11-02 NOTE — Telephone Encounter (Signed)
He sees another family practice office

## 2017-11-17 ENCOUNTER — Ambulatory Visit: Payer: 59 | Admitting: Medical

## 2017-11-17 ENCOUNTER — Encounter: Payer: Self-pay | Admitting: Medical

## 2017-11-17 VITALS — BP 140/70 | HR 86 | Temp 98.8°F | Ht 68.0 in | Wt >= 6400 oz

## 2017-11-17 DIAGNOSIS — G4733 Obstructive sleep apnea (adult) (pediatric): Secondary | ICD-10-CM | POA: Diagnosis not present

## 2017-11-17 DIAGNOSIS — Z125 Encounter for screening for malignant neoplasm of prostate: Secondary | ICD-10-CM | POA: Diagnosis not present

## 2017-11-17 DIAGNOSIS — R7989 Other specified abnormal findings of blood chemistry: Secondary | ICD-10-CM | POA: Diagnosis not present

## 2017-11-17 DIAGNOSIS — Z0000 Encounter for general adult medical examination without abnormal findings: Secondary | ICD-10-CM | POA: Diagnosis not present

## 2017-11-17 DIAGNOSIS — M1000 Idiopathic gout, unspecified site: Secondary | ICD-10-CM

## 2017-11-17 DIAGNOSIS — E559 Vitamin D deficiency, unspecified: Secondary | ICD-10-CM | POA: Diagnosis not present

## 2017-11-17 DIAGNOSIS — M5136 Other intervertebral disc degeneration, lumbar region: Secondary | ICD-10-CM | POA: Diagnosis not present

## 2017-11-17 DIAGNOSIS — Z8249 Family history of ischemic heart disease and other diseases of the circulatory system: Secondary | ICD-10-CM | POA: Diagnosis not present

## 2017-11-17 DIAGNOSIS — E538 Deficiency of other specified B group vitamins: Secondary | ICD-10-CM | POA: Diagnosis not present

## 2017-11-17 DIAGNOSIS — Z113 Encounter for screening for infections with a predominantly sexual mode of transmission: Secondary | ICD-10-CM

## 2017-11-17 DIAGNOSIS — I10 Essential (primary) hypertension: Secondary | ICD-10-CM

## 2017-11-17 LAB — POCT URINALYSIS DIP (PROADVANTAGE DEVICE)
BILIRUBIN UA: NEGATIVE
BILIRUBIN UA: NEGATIVE mg/dL
GLUCOSE UA: NEGATIVE mg/dL
LEUKOCYTES UA: NEGATIVE
NITRITE UA: NEGATIVE
Protein Ur, POC: 100 mg/dL — AB
RBC UA: NEGATIVE
pH, UA: 6 (ref 5.0–8.0)

## 2017-11-17 NOTE — Progress Notes (Addendum)
Subjective:   HPI  Patrick Baxter is a 47 y.o. male who presents for physical Chief Complaint  Patient presents with  . Annual Exam   Medical care team includes: Tysinger, Kermit Balo, PA-C here for primary care Dentist Eye doctor Ortho - Dr. Ethelene Hal dermatology Dr. Debbra Riding, weight management clinic  Concerns: Has been seeing weight management clinic, had some recent labs recently.  Has been started on some new medication since last visit here.  Also had recent echocardiogram.  He is compliant with CPAP.  Uses [facility].  Reviewed their medical, surgical, family, social, medication, and allergy history and updated chart as appropriate.  Past Medical History:  Diagnosis Date  . Allergy   . Anxiety   . Back pain   . Bilateral chronic knee pain   . Chronic headaches   . Depression   . Dermatographism   . Dry skin   . Fatigue   . Frequent urination   . Gout   . Hypertension   . Joint pain   . Knee pain   . Obesity   . Sleep apnea   . Stress   . Wears glasses     Past Surgical History:  Procedure Laterality Date  . KNEE SURGERY  11/2006   right knee arthroscopy and meniscal repair, Dr. Conni Elliot    Social History   Socioeconomic History  . Marital status: Single    Spouse name: Not on file  . Number of children: Not on file  . Years of education: Not on file  . Highest education level: Not on file  Occupational History  . Occupation: Comptroller  Social Needs  . Financial resource strain: Not on file  . Food insecurity:    Worry: Not on file    Inability: Not on file  . Transportation needs:    Medical: Not on file    Non-medical: Not on file  Tobacco Use  . Smoking status: Former Games developer  . Smokeless tobacco: Never Used  . Tobacco comment: socially prior, not daily smoker  Substance and Sexual Activity  . Alcohol use: Yes    Alcohol/week: 1.8 oz    Types: 3 Cans of beer per week  . Drug use: No  . Sexual activity: Not on file  Lifestyle  .  Physical activity:    Days per week: Not on file    Minutes per session: Not on file  . Stress: Not on file  Relationships  . Social connections:    Talks on phone: Not on file    Gets together: Not on file    Attends religious service: Not on file    Active member of club or organization: Not on file    Attends meetings of clubs or organizations: Not on file    Relationship status: Not on file  . Intimate partner violence:    Fear of current or ex partner: Not on file    Emotionally abused: Not on file    Physically abused: Not on file    Forced sexual activity: Not on file  Other Topics Concern  . Not on file  Social History Narrative   Single, works for [employer]).   No significant other.      Family History  Problem Relation Age of Onset  . Alzheimer's disease Mother   . Heart disease Mother        7, angioplasty, CAD  . Cancer Mother        breast  . Stroke Mother   .  Hypertension Mother   . Hyperlipidemia Mother   . Heart disease Father        angina  . Arthritis Father   . Cancer Maternal Grandmother        leukemia  . Diabetes Paternal Grandmother      Current Outpatient Medications:  .  allopurinol (ZYLOPRIM) 100 MG tablet, Take 1 tablet (100 mg total) by mouth daily., Disp: 90 tablet, Rfl: 3 .  B Complex-C (PROBEC-T PO), Take by mouth., Disp: , Rfl:  .  Calcium-Magnesium-Zinc 167-83-8 MG TABS, Take 1 tablet by mouth daily., Disp: , Rfl:  .  Coenzyme Q10 (CO Q 10 PO), Take by mouth., Disp: , Rfl:  .  cyclobenzaprine (FLEXERIL) 10 MG tablet, 1 tablet at QHS prn or 1/2-1 tablet during the day prn, Disp: 30 tablet, Rfl: 3 .  FLUoxetine (PROZAC) 40 MG capsule, TAKE 1 CAPSULE(40 MG) BY MOUTH DAILY, Disp: 90 capsule, Rfl: 0 .  Hyaluronic Acid 20-60 MG CAPS, Take 120 mg by mouth., Disp: , Rfl:  .  levothyroxine (SYNTHROID, LEVOTHROID) 25 MCG tablet, Take 1 tablet (25 mcg total) by mouth daily before breakfast., Disp: 90 tablet, Rfl: 0 .  lisinopril  (PRINIVIL,ZESTRIL) 10 MG tablet, Take 1 tablet (10 mg total) by mouth daily., Disp: 90 tablet, Rfl: 0 .  metFORMIN (GLUCOPHAGE) 500 MG tablet, Take 1 tablet (500 mg total) by mouth daily with breakfast., Disp: 90 tablet, Rfl: 0 .  metoprolol succinate (TOPROL-XL) 50 MG 24 hr tablet, TAKE 1 TABLET BY MOUTH DAILY WITH OR IMMEDIATELY FOLLOWING A MEAL, Disp: 90 tablet, Rfl: 0 .  montelukast (SINGULAIR) 10 MG tablet, TAKE 1 TABLET(10 MG) BY MOUTH AT BEDTIME, Disp: 90 tablet, Rfl: 3 .  Multiple Vitamins-Minerals (MULTIVITAMIN WITH MINERALS) tablet, Take 1 tablet by mouth daily., Disp: , Rfl:  .  naproxen (NAPROSYN) 250 MG tablet, Take by mouth 3 (three) times daily with meals., Disp: , Rfl:  .  Omega-3 Fatty Acids (SALMON OIL-1000) 200 MG CAPS, Take by mouth., Disp: , Rfl:  .  Spirulina 500 MG TABS, Take 1 tablet by mouth 2 (two) times daily., Disp: , Rfl:  .  vitamin B-12 (CYANOCOBALAMIN) 1000 MCG tablet, Take 1 tablet (1,000 mcg total) by mouth daily., Disp: 30 tablet, Rfl: 0 .  Vitamin D, Ergocalciferol, (DRISDOL) 50000 units CAPS capsule, Take 1 capsule (50,000 Units total) by mouth every 7 (seven) days., Disp: 4 capsule, Rfl: 0  No Known Allergies     Review of Systems Constitutional: -fever, -chills, -sweats, -unexpected weight change, -decreased appetite, -fatigue Allergy: -sneezing, -itching, -congestion Dermatology: -changing moles, --rash, -lumps ENT: -runny nose, -ear pain, -sore throat, -hoarseness, -sinus pain, -teeth pain, - ringing in ears, -hearing loss, -nosebleeds Cardiology: -chest pain, -palpitations, -swelling, -difficulty breathing when lying flat, -waking up short of breath Respiratory: -cough, -shortness of breath, -difficulty breathing with exercise or exertion, -wheezing, -coughing up blood Gastroenterology: -abdominal pain, -nausea, -vomiting, -diarrhea, -constipation, -blood in stool, -changes in bowel movement, -difficulty swallowing or eating Hematology: -bleeding,  -bruising  Musculoskeletal: +joint aches, -muscle aches, -joint swelling, +back pain, -neck pain, -cramping, -changes in gait Ophthalmology: denies vision changes, eye redness, itching, discharge Urology: -burning with urination, -difficulty urinating, -blood in urine, -urinary frequency, -urgency, -incontinence Neurology: -headache, -weakness, +tingling, -numbness, -memory loss, -falls, -dizziness Psychology: -depressed mood, -agitation, -sleep problems     Objective:   BP 140/70   Pulse 86   Temp 98.8 F (37.1 C) (Oral)   Ht  (1.727 m)   Wt Marland Kitchen)  449 lb 12.8 oz (204 kg)   SpO2 97%   BMI 68.39 kg/m   BP Readings from Last 3 Encounters:  11/17/17 140/70  10/01/17 (!) 163/82  09/15/17 (!) 158/82    Wt Readings from Last 3 Encounters:  11/17/17 (!) 449 lb 12.8 oz (204 kg)  10/01/17 (!) 463 lb (210 kg)  09/15/17 (!) 467 lb (211.8 kg)    General appearance: alert, no distress, WD/WN, Caucasian male Skin: scattered macules, no worrisome lesions HEENT: normocephalic, conjunctiva/corneas normal, sclerae anicteric, PERRLA, EOMi, nares patent, no discharge or erythema, pharynx normal Oral cavity: MMM, tongue normal, teeth normal Neck: supple, no lymphadenopathy, no thyromegaly, no masses, normal ROM, no bruits Chest: non tender, normal shape and expansion Heart: RRR, normal S1, S2, no murmurs Lungs: CTA bilaterally, no wheezes, rhonchi, or rales Abdomen: +bs, soft, non tender, non distended, no masses, no hepatomegaly, no splenomegaly, no bruits Back: non tender, normal ROM, no scoliosis Musculoskeletal: upper extremities non tender, no obvious deformity, normal ROM throughout, lower extremities non tender, no obvious deformity, normal ROM throughout Extremities: no edema, no cyanosis, no clubbing Pulses: 2+ symmetric, upper and lower extremities, normal cap refill Neurological: alert, oriented x 3, CN2-12 intact, strength normal upper extremities and lower extremities,  sensation normal throughout, DTRs 2+ throughout, no cerebellar signs, gait normal Psychiatric: normal affect, behavior normal, pleasant  GU: declined Rectal: deferred  Assessment and Plan :    Encounter Diagnoses  Name Primary?  . Encounter for health maintenance examination in adult Yes  . Family history of premature CAD   . Morbid obesity (HCC)   . Screen for STD (sexually transmitted disease)   . Idiopathic gout, unspecified chronicity, unspecified site   . Essential hypertension   . OSA (obstructive sleep apnea)   . DDD (degenerative disc disease), lumbar   . B12 deficiency   . Abnormal thyroid blood test   . Vitamin D deficiency   . Screening for prostate cancer     Physical exam - discussed and counseled on healthy lifestyle, diet, exercise, preventative care, vaccinations, sick and well care, proper use of emergency dept and after hours care, and addressed their concerns.    Health screening: See your eye doctor yearly for routine vision care. See your dentist yearly for routine dental care including hygiene visits twice yearly.  Discussed STD testing, discussed prevention, condom use, means of transmission  Cancer screening Discussed colonoscopy screening age 2yo unless higher risk for earlier screening Discussed PSA, prostate exam, and prostate cancer screening risks/benefits.   Discussed prostate symptoms as well.    Vaccinations: Counseled on the following vaccines:  influenza  Acute issues discussed: none  Separate significant chronic issues discussed: Obesity - reviewed recent chart notes from weight management clinic  B12 deficiency - taking oral B12 OTC, lab recheck today  Vit D deficiency - advised daily Vit D 1000u  Hypothyroid - c/t medication, lab today  Impaired glucose - c/t metformin  HTN - c/t current medication  Gout - labs today, c/t allopurinol  OSA- c/t CPAP, improved symptoms and he reports seeing benefit with CPAP.  Raman was  seen today for annual exam.  Diagnoses and all orders for this visit:  Encounter for health maintenance examination in adult -     POCT Urinalysis DIP (Proadvantage Device) -     HIV antibody -     RPR -     GC/Chlamydia Probe Amp -     TSH -     T4, free -  Vitamin B12 -     PSA -     Uric acid  Family history of premature CAD  Morbid obesity (HCC)  Screen for STD (sexually transmitted disease) -     HIV antibody -     RPR -     GC/Chlamydia Probe Amp  Idiopathic gout, unspecified chronicity, unspecified site -     Uric acid  Essential hypertension  OSA (obstructive sleep apnea)  DDD (degenerative disc disease), lumbar  B12 deficiency -     Vitamin B12  Abnormal thyroid blood test -     TSH -     T4, free  Vitamin D deficiency  Screening for prostate cancer   Follow-up pending labs, yearly for physical

## 2017-11-18 ENCOUNTER — Other Ambulatory Visit: Payer: Self-pay | Admitting: Medical

## 2017-11-18 DIAGNOSIS — E538 Deficiency of other specified B group vitamins: Secondary | ICD-10-CM

## 2017-11-18 DIAGNOSIS — I10 Essential (primary) hypertension: Secondary | ICD-10-CM

## 2017-11-18 DIAGNOSIS — R7303 Prediabetes: Secondary | ICD-10-CM

## 2017-11-18 DIAGNOSIS — E038 Other specified hypothyroidism: Secondary | ICD-10-CM

## 2017-11-18 LAB — TSH: TSH: 2.62 u[IU]/mL (ref 0.450–4.500)

## 2017-11-18 LAB — RPR: RPR Ser Ql: NONREACTIVE

## 2017-11-18 LAB — T4, FREE: Free T4: 1.26 ng/dL (ref 0.82–1.77)

## 2017-11-18 LAB — GC/CHLAMYDIA PROBE AMP
Chlamydia trachomatis, NAA: NEGATIVE
Neisseria gonorrhoeae by PCR: NEGATIVE

## 2017-11-18 LAB — URIC ACID: Uric Acid: 9.3 mg/dL — ABNORMAL HIGH (ref 3.7–8.6)

## 2017-11-18 LAB — PSA: PROSTATE SPECIFIC AG, SERUM: 0.3 ng/mL (ref 0.0–4.0)

## 2017-11-18 LAB — VITAMIN B12: Vitamin B-12: 720 pg/mL (ref 232–1245)

## 2017-11-18 LAB — HIV ANTIBODY (ROUTINE TESTING W REFLEX): HIV Screen 4th Generation wRfx: NONREACTIVE

## 2017-11-18 MED ORDER — MULTI-VITAMIN/MINERALS PO TABS: 1.0000 | ORAL_TABLET | Freq: Every day | ORAL | 3 refills | Status: DC

## 2017-11-18 MED ORDER — VITAMIN B-12 1000 MCG PO TABS: 1000.0000 ug | ORAL_TABLET | Freq: Every day | ORAL | 3 refills | Status: DC

## 2017-11-18 MED ORDER — METFORMIN HCL 500 MG PO TABS: 500.0000 mg | ORAL_TABLET | Freq: Every day | ORAL | 0 refills | Status: DC

## 2017-11-18 MED ORDER — VITAMIN D 1000 UNITS PO TABS: 1000.0000 [IU] | ORAL_TABLET | Freq: Every day | ORAL | 3 refills | Status: DC

## 2017-11-18 MED ORDER — METOPROLOL SUCCINATE ER 50 MG PO TB24: ORAL_TABLET | ORAL | 3 refills | Status: DC

## 2017-11-18 MED ORDER — LEVOTHYROXINE SODIUM 25 MCG PO TABS: 25.0000 ug | ORAL_TABLET | Freq: Every day | ORAL | 0 refills | Status: DC

## 2017-11-18 MED ORDER — LISINOPRIL 10 MG PO TABS: 10.0000 mg | ORAL_TABLET | Freq: Every day | ORAL | 3 refills | Status: DC

## 2017-11-18 MED ORDER — ALLOPURINOL 300 MG PO TABS: 300.0000 mg | ORAL_TABLET | Freq: Every day | ORAL | 3 refills | Status: DC

## 2017-11-18 MED ORDER — FLUOXETINE HCL 40 MG PO CAPS: 40.0000 mg | ORAL_CAPSULE | Freq: Every day | ORAL | 3 refills | Status: DC

## 2017-11-25 ENCOUNTER — Encounter: Payer: Self-pay | Admitting: Medical

## 2017-12-22 ENCOUNTER — Encounter: Payer: Self-pay | Admitting: Medical

## 2017-12-28 ENCOUNTER — Encounter: Payer: Self-pay | Admitting: Medical

## 2018-01-13 ENCOUNTER — Other Ambulatory Visit: Payer: Self-pay | Admitting: Medical

## 2018-01-15 NOTE — Telephone Encounter (Signed)
Is this ok to refill?  

## 2018-02-26 ENCOUNTER — Telehealth: Payer: Self-pay

## 2018-02-26 NOTE — Telephone Encounter (Signed)
Please review [facility] fax. Please revise note 11/17/17 and add that "patient is benefiting from CPAP".  Patrick Baxter can be reached at [phone] (the cpap guy)

## 2018-03-01 ENCOUNTER — Other Ambulatory Visit: Payer: Self-pay

## 2018-03-01 DIAGNOSIS — G4733 Obstructive sleep apnea (adult) (pediatric): Secondary | ICD-10-CM

## 2018-03-01 NOTE — Telephone Encounter (Signed)
Done

## 2018-04-14 ENCOUNTER — Encounter: Payer: Self-pay | Admitting: Medical

## 2018-04-14 ENCOUNTER — Ambulatory Visit: Payer: 59 | Admitting: Medical

## 2018-04-14 VITALS — BP 130/84 | HR 83 | Temp 97.7°F | Resp 16 | Ht 68.0 in | Wt >= 6400 oz

## 2018-04-14 DIAGNOSIS — G4733 Obstructive sleep apnea (adult) (pediatric): Secondary | ICD-10-CM | POA: Diagnosis not present

## 2018-04-14 DIAGNOSIS — G479 Sleep disorder, unspecified: Secondary | ICD-10-CM

## 2018-04-14 DIAGNOSIS — H9193 Unspecified hearing loss, bilateral: Secondary | ICD-10-CM | POA: Diagnosis not present

## 2018-04-14 DIAGNOSIS — H6123 Impacted cerumen, bilateral: Secondary | ICD-10-CM | POA: Diagnosis not present

## 2018-04-14 DIAGNOSIS — M25561 Pain in right knee: Secondary | ICD-10-CM

## 2018-04-14 DIAGNOSIS — M25562 Pain in left knee: Secondary | ICD-10-CM

## 2018-04-14 DIAGNOSIS — R7303 Prediabetes: Secondary | ICD-10-CM

## 2018-04-14 DIAGNOSIS — G8929 Other chronic pain: Secondary | ICD-10-CM

## 2018-04-14 DIAGNOSIS — I10 Essential (primary) hypertension: Secondary | ICD-10-CM

## 2018-04-14 DIAGNOSIS — E038 Other specified hypothyroidism: Secondary | ICD-10-CM

## 2018-04-15 MED ORDER — LEVOTHYROXINE SODIUM 25 MCG PO TABS: 25.0000 ug | ORAL_TABLET | Freq: Every day | ORAL | 0 refills | Status: DC

## 2018-04-15 MED ORDER — METFORMIN HCL 500 MG PO TABS: 500.0000 mg | ORAL_TABLET | Freq: Every day | ORAL | 0 refills | Status: DC

## 2018-04-15 MED ORDER — ALPRAZOLAM 0.5 MG PO TABS: 0.5000 mg | ORAL_TABLET | Freq: Every evening | ORAL | 0 refills | Status: DC | PRN

## 2018-04-15 NOTE — Progress Notes (Signed)
Subjective:   Here for decreased hearing, wanted by earwax buildup.  No URI symptoms, no cough no sore throat no fever.  He wants an update on his handicap placard.  Still have a lot of problems with his knees, saw orthopedics recently  Says he is due for refill on medications  He reports compliance of medication, using CPAP.  Having some trouble getting to sleep, would like a sleep aid.  Has been difficulty with the mask getting a sleep.  Uses Flexeril sometimes but this makes him too groggy and the effect lasts too long  No other aggravating or relieving factors.  No other complaint.  Review of Systems Constitutional: denies fever, chills, sweats ENT: no runny nose, ear pain, sore throat, hoarseness, sinus pain, teeth pain, tinnitus, hearing loss Gastroenterology: denies nausea, vomiting     Objective:   Physical Exam BP 130/84   Pulse 83   Temp 97.7 F (36.5 C) (Oral)   Resp 16   Ht 5\' 8"  (1.727 m)   Wt (!) 450 lb 12.8 oz (204.5 kg)   SpO2 97%   BMI 68.54 kg/m   Wt Readings from Last 3 Encounters:  04/14/18 (!) 450 lb 12.8 oz (204.5 kg)  11/17/17 (!) 449 lb 12.8 oz (204 kg)  10/01/17 (!) 463 lb (210 kg)     General appearance: alert, no distress, WD/WN Ears: Impacted cerumen in both ear canals HENT: conjunctiva/corneas normal, sclerae anicteric, nares patent, no discharge or erythema, pharynx normal Oral cavity: MMM, tongue normal, teeth normal Neurological: Hearing normal bilaterally to whisper    Assessment & Plan:    Encounter Diagnoses  Name Primary?  . Decreased hearing of both ears Yes  . Impacted cerumen of both ears   . OSA (obstructive sleep apnea)   . Essential hypertension   . Chronic pain of both knees   . Morbid obesity (HCC)   . Other specified hypothyroidism   . Prediabetes   . Sleep disorder      Discussed findings.  Discussed risk/benefits of procedure and patient agrees to procedure. Successfully used warm water lavage to remove  impacted cerumen from bilat ear canal. Patient tolerated procedure well. Advised they avoid using any cotton swabs or other devices to clean the ear canals.  Use basic hygiene as discussed.  Follow up prn.   OSA - c/t CPAP  Continue current medications for blood pressure  He has not had very much success with weight loss and does not seem to be all that motivated.  I had referred him to weight management clinic but I believe he stopped going  Gave short-term temporary handicap placard but encouraged him to get back in with orthopedics and do more to help his situation with weight loss  We will try short acting Xanax to help with sleep  Patrick Baxter was seen today for ear wax.  Diagnoses and all orders for this visit:  Decreased hearing of both ears  Impacted cerumen of both ears  OSA (obstructive sleep apnea)  Essential hypertension  Chronic pain of both knees  Morbid obesity (HCC)  Other specified hypothyroidism -     levothyroxine (SYNTHROID, LEVOTHROID) 25 MCG tablet; Take 1 tablet (25 mcg total) by mouth daily before breakfast.  Prediabetes -     metFORMIN (GLUCOPHAGE) 500 MG tablet; Take 1 tablet (500 mg total) by mouth daily with breakfast.  Sleep disorder  Other orders -     ALPRAZolam (XANAX) 0.5 MG tablet; Take 1 tablet (0.5 mg total) by  mouth at bedtime as needed for anxiety.

## 2018-06-15 ENCOUNTER — Other Ambulatory Visit: Payer: Self-pay | Admitting: Medical

## 2018-06-16 ENCOUNTER — Ambulatory Visit: Payer: 59 | Admitting: Medical

## 2018-06-16 ENCOUNTER — Encounter: Payer: Self-pay | Admitting: Medical

## 2018-06-16 VITALS — BP 138/80 | HR 88 | Temp 98.3°F | Resp 18 | Ht 68.0 in | Wt >= 6400 oz

## 2018-06-16 DIAGNOSIS — R7989 Other specified abnormal findings of blood chemistry: Secondary | ICD-10-CM

## 2018-06-16 DIAGNOSIS — M545 Low back pain: Secondary | ICD-10-CM

## 2018-06-16 DIAGNOSIS — M25561 Pain in right knee: Secondary | ICD-10-CM

## 2018-06-16 DIAGNOSIS — I10 Essential (primary) hypertension: Secondary | ICD-10-CM | POA: Diagnosis not present

## 2018-06-16 DIAGNOSIS — G4733 Obstructive sleep apnea (adult) (pediatric): Secondary | ICD-10-CM

## 2018-06-16 DIAGNOSIS — M1000 Idiopathic gout, unspecified site: Secondary | ICD-10-CM | POA: Diagnosis not present

## 2018-06-16 DIAGNOSIS — R7301 Impaired fasting glucose: Secondary | ICD-10-CM

## 2018-06-16 DIAGNOSIS — G8929 Other chronic pain: Secondary | ICD-10-CM

## 2018-06-16 DIAGNOSIS — M25562 Pain in left knee: Secondary | ICD-10-CM

## 2018-06-16 MED ORDER — COLCHICINE 0.6 MG PO TABS: 0.6000 mg | ORAL_TABLET | Freq: Two times a day (BID) | ORAL | 0 refills | Status: DC

## 2018-06-16 NOTE — Telephone Encounter (Signed)
Is this ok to refill?  

## 2018-06-16 NOTE — Patient Instructions (Signed)
Take steps today to improve your health  Begin with 20 minutes 5 days per week with walking on elliptical machine     Lets change strategies and try something that may work better than what you are currently doing  I want you to eat 3 meals a day +2 snacks, one midmorning snack and one mid afternoon snack  Breakfast You may eat 1 of the following  Smoothie with Almond milk, handful of kale or spinach, and 1-2 fruit servings of your choice such as berries or 1/2 banana  Whole grain slice of toast and thin layer of low sugar jam or small amount of honey  Whole grain slice of toast and avocado spread  1/2 cup of steel cut oats (oatmeal)   Mid-morning snack 1 fruit serving such as one of the following:  medium-sized apple  medium-sized orange,  Tangerine  1/2 banana   3/4 cup of fresh berries or frozen berries  A protein source such as one of the following:  8 almonds   small handful of walnuts or other nuts  Hummus and vegetable such as carrots   Lunch A protein source such as 1 of the following: . 1 serving of beans such as black beans, pinto beans, green beans, or edamame (soy beans) . Veggie burger  . Non breaded fish such as salmon or tuna, either baked, grilled, or broiled Vegetable - Half of your plate should be a non-starchy vegetables!  So avoid white potatoes and corn.  Otherwise, eat a large portion of vegetables. . Avocado, cucumber, tomato, carrots, greens, lettuce, squash, okra, etc.  . Vegetables can include salad with olive oil/vinaigrette dressing Grains such as 1/2 cup of brown rice, quinoa, barley or other whole grain or 1 or 2 slices of whole grain bread   Mid-afternoon snack 1 fruit serving such as one of the following:  medium-sized apple  medium-sized orange,  Tangerine  1/2 banana   3/4 cup of fresh berries or frozen berries  A protein source such as one of the following:  8 almonds   small handful of walnuts or other  nuts  Hummus and vegetable such as carrots   Dinner A protein source such as 1 of the following: . 1 serving of beans such as black beans, pinto beans, green beans, or edamame (soy beans) . Veggie burger  . Non breaded fish such as salmon or tuna, either baked, grilled, or broiled Vegetable - Half of your plate should be a non-starchy vegetables!  So avoid white potatoes and corn.  Otherwise, eat a large portion of vegetables. . Avocado, cucumber, tomato, carrots, greens, lettuce, squash, okra, etc.  . Vegetables can include salad with olive oil/vinaigrette dressing Grains such as 1/2 cup of brown rice, quinoa, barley or other whole grain or 1 or 2 slices of whole grain bread   Beverages: Water Unsweet tea Home made juice with a juicer without sugar added other than small bit of honey or agave nectar Water with sugar free flavor such as M[place]   AVOID.... For the time being I want you to cut out the following items completely: . Soda, sweet tea, juice, beer or wine or alcohol . Sweets such as cake, candy, pies, chips, cookies, chocolate

## 2018-06-16 NOTE — Progress Notes (Signed)
Subjective: Chief Complaint  Patient presents with  . back pain    back pain and gout attack    Here for possible gout flare and back pain.  Has only ever had gout in great toe once.   Currently has pain in left great toe.   Today is the first day he has been able to wear insert compared to last several days.  Taking Allopurinol 300mg  but wonders about going back on Colchicine.  Had mild flare a month ago.  Has been drinking a lot of diet soda.  Exercise - nothing.  Diet - has tried cutting back on sugars.    Continues to have back pain and constant knee pain.   Past Medical History:  Diagnosis Date  . Allergy   . Anxiety   . Back pain   . Bilateral chronic knee pain   . Chronic headaches   . Depression   . Dermatographism   . Dry skin   . Fatigue   . Frequent urination   . Gout   . Hypertension   . Joint pain   . Knee pain   . Obesity   . Sleep apnea   . Stress   . Wears glasses    Current Outpatient Medications on File Prior to Visit  Medication Sig Dispense Refill  . allopurinol (ZYLOPRIM) 300 MG tablet Take 1 tablet (300 mg total) by mouth daily. 90 tablet 3  . B Complex-C (PROBEC-T PO) Take by mouth.    . Calcium-Magnesium-Zinc 167-83-8 MG TABS Take 1 tablet by mouth daily.    . cholecalciferol (VITAMIN D) 1000 units tablet Take 1 tablet (1,000 Units total) by mouth daily. 90 tablet 3  . Coenzyme Q10 (CO Q 10 PO) Take by mouth.    . cyclobenzaprine (FLEXERIL) 10 MG tablet TAKE 1 TABLET BY MOUTH EVERY NIGHT AT BEDTIME AS NEEDED OR 1/2 TO 1 TABLET DURING THE DAY AS NEEDED 30 tablet 0  . FLUoxetine (PROZAC) 40 MG capsule Take 1 capsule (40 mg total) by mouth daily. 90 capsule 3  . Hyaluronic Acid 20-60 MG CAPS Take 120 mg by mouth.    Marland Kitchen. lisinopril (PRINIVIL,ZESTRIL) 10 MG tablet Take 1 tablet (10 mg total) by mouth daily. 90 tablet 3  . metoprolol succinate (TOPROL-XL) 50 MG 24 hr tablet TAKE 1 TABLET BY MOUTH DAILY WITH OR IMMEDIATELY FOLLOWING A MEAL 90 tablet 3  .  montelukast (SINGULAIR) 10 MG tablet TAKE 1 TABLET(10 MG) BY MOUTH AT BEDTIME 90 tablet 3  . Multiple Vitamins-Minerals (MULTIVITAMIN WITH MINERALS) tablet Take 1 tablet by mouth daily. 90 tablet 3  . naproxen (NAPROSYN) 250 MG tablet Take by mouth 3 (three) times daily with meals.    . Omega-3 Fatty Acids (SALMON OIL-1000) 200 MG CAPS Take by mouth.    . vitamin B-12 (CYANOCOBALAMIN) 1000 MCG tablet Take 1 tablet (1,000 mcg total) by mouth daily. 90 tablet 3  . levothyroxine (SYNTHROID, LEVOTHROID) 25 MCG tablet Take 1 tablet (25 mcg total) by mouth daily before breakfast. (Patient not taking: Reported on 06/16/2018) 90 tablet 0  . metFORMIN (GLUCOPHAGE) 500 MG tablet Take 1 tablet (500 mg total) by mouth daily with breakfast. (Patient not taking: Reported on 06/16/2018) 90 tablet 0  . Spirulina 500 MG TABS Take 1 tablet by mouth 2 (two) times daily.     No current facility-administered medications on file prior to visit.    ROS as in subjective   Objective: BP 138/80   Pulse 88  Temp 98.3 F (36.8 C) (Oral)   Resp 18   Ht 5\' 8"  (1.727 m)   Wt (!) 452 lb 6.4 oz (205.2 kg)   SpO2 96%   BMI 68.79 kg/m   Wt Readings from Last 3 Encounters:  06/16/18 (!) 452 lb 6.4 oz (205.2 kg)  04/14/18 (!) 450 lb 12.8 oz (204.5 kg)  11/17/17 (!) 449 lb 12.8 oz (204 kg)   General appearance: alert, no distress, WD/WN, morbidly obese white male Mildly tender in his lower lumbar central area of his back, but normal range of motion Hips non tender with no pain with range of motion, tender great toe of the left foot, mild swelling of left greater Pulses: 2+ symmetric, upper and lower extremities, normal cap refill No edema   Assessment: Encounter Diagnoses  Name Primary?  . Essential hypertension Yes  . Abnormal thyroid blood test   . Idiopathic gout, unspecified chronicity, unspecified site   . Chronic bilateral low back pain without sciatica   . Chronic pain of both knees   . Morbid obesity  (HCC)   . OSA (obstructive sleep apnea)   . Impaired fasting blood sugar      Plan: Gout flare-add colchicine for short-term treatment, continue allopurinol 300 mg daily, labs today  Hypertension-continue current medication  Abnormal thyroid labs-recheck labs today, and advised he talk with pharmacy as there apparently is a discrepancy between the quantity dispensed versus what we prescribed.  It is been difficult working with him on being motivated to lose weight.  He has steadily gained weight over the last few years.  He quit going to the weight management clinic although we were hopeful this would get him motivated.  Current weight is over 450 pounds.  Counseled on diet today, counseled on taking steps every day to go in the right direction even if this a 10-minute walk this would be better than nothing.  Advised also is up to him to take some steps to improve his situation  Unfortunately he already has several comorbid issues related to his weight including back pain, knee pain, thyroid issues, blood pressure issues, sleep apnea and impaired glucose.  In the past he has declined surgery referral, and unfortunately is not doing much at all to help with situation currently   Ryosuke was seen today for back pain.  Diagnoses and all orders for this visit:  Essential hypertension -     Hemoglobin A1c  Abnormal thyroid blood test -     TSH -     T4, free  Idiopathic gout, unspecified chronicity, unspecified site -     Uric acid  Chronic bilateral low back pain without sciatica  Chronic pain of both knees  Morbid obesity (HCC) -     Hemoglobin A1c  OSA (obstructive sleep apnea)  Impaired fasting blood sugar  Other orders -     colchicine 0.6 MG tablet; Take 1 tablet (0.6 mg total) by mouth 2 (two) times daily.

## 2018-06-17 ENCOUNTER — Other Ambulatory Visit: Payer: Self-pay | Admitting: Medical

## 2018-06-17 DIAGNOSIS — E038 Other specified hypothyroidism: Secondary | ICD-10-CM

## 2018-06-17 DIAGNOSIS — R7303 Prediabetes: Secondary | ICD-10-CM

## 2018-06-17 LAB — T4, FREE: Free T4: 1.1 ng/dL (ref 0.82–1.77)

## 2018-06-17 LAB — URIC ACID: Uric Acid: 4.7 mg/dL (ref 3.7–8.6)

## 2018-06-17 LAB — HEMOGLOBIN A1C
Est. average glucose Bld gHb Est-mCnc: 117 mg/dL
Hgb A1c MFr Bld: 5.7 % — ABNORMAL HIGH (ref 4.8–5.6)

## 2018-06-17 LAB — TSH: TSH: 2.6 u[IU]/mL (ref 0.450–4.500)

## 2018-06-17 MED ORDER — FLUOXETINE HCL 40 MG PO CAPS: 40.0000 mg | ORAL_CAPSULE | Freq: Every day | ORAL | 0 refills | Status: DC

## 2018-06-17 MED ORDER — METFORMIN HCL 500 MG PO TABS: 500.0000 mg | ORAL_TABLET | Freq: Every day | ORAL | 0 refills | Status: DC

## 2018-06-17 MED ORDER — LEVOTHYROXINE SODIUM 25 MCG PO TABS: 25.0000 ug | ORAL_TABLET | Freq: Every day | ORAL | 1 refills | Status: DC

## 2018-06-24 ENCOUNTER — Encounter: Payer: Self-pay | Admitting: Internal Medicine

## 2018-08-06 ENCOUNTER — Other Ambulatory Visit: Payer: Self-pay | Admitting: Medical

## 2018-08-06 NOTE — Telephone Encounter (Signed)
Is this ok to refill?  

## 2018-09-28 ENCOUNTER — Other Ambulatory Visit: Payer: Self-pay | Admitting: Medical

## 2018-09-28 NOTE — Telephone Encounter (Signed)
[facility] is requesting to fill pt flexeril. Please advise [facility]

## 2018-10-05 ENCOUNTER — Telehealth (INDEPENDENT_AMBULATORY_CARE_PROVIDER_SITE_OTHER): Payer: 59 | Admitting: Medical

## 2018-10-05 ENCOUNTER — Telehealth: Payer: Self-pay | Admitting: Medical

## 2018-10-05 ENCOUNTER — Other Ambulatory Visit: Payer: Self-pay

## 2018-10-05 DIAGNOSIS — R7301 Impaired fasting glucose: Secondary | ICD-10-CM

## 2018-10-05 DIAGNOSIS — R05 Cough: Secondary | ICD-10-CM | POA: Diagnosis not present

## 2018-10-05 DIAGNOSIS — R059 Cough, unspecified: Secondary | ICD-10-CM

## 2018-10-05 DIAGNOSIS — R0600 Dyspnea, unspecified: Secondary | ICD-10-CM | POA: Diagnosis not present

## 2018-10-05 DIAGNOSIS — I10 Essential (primary) hypertension: Secondary | ICD-10-CM

## 2018-10-05 NOTE — Patient Instructions (Signed)
Your symptoms per our phone consult today suggest a respiratory tract infection.  Recommendations 1- I recommend rest the next several days 2- Make sure you are hydrating well with clear fluids such as water, G2 Gatorade, soup broth and other clear fluids 3 -You can use Benadryl or other allergy medication such as Zyrtec daily for the next week.  This will help with drainage and congestion.  Given that this is allergy season though, you may want to continue allergy medicine daily for the next several weeks 4- If you develop more of a cough you can use Delsym cough suppressant over-the-counter or Robitussin-DM for cough and congestion 5- If you are significantly worse in the coming days such as fever over 101, vomiting, shortness of breath, then either call or go to the triage tent at [facility] emergency department 6-we recommend you quarantine yourself off at home to prevent spreading your illness to your work contacts or your family.  Wash your hands regularly with soap and water for 20 seconds or more, cough into your elbow or if he had a mask at home wear a mask when interacting in the kitchen or other parts of the house other people may be located 7-the current recommendations are to remain at home until you have 72 hours of no symptoms.  Your current illness would likely last the next 3 to 5 days so theoretically you should not be back at work this week at all  I hope you get to feeling better soon!

## 2018-10-05 NOTE — Telephone Encounter (Signed)
Write note for out of work all week.  I did phone call consult with him today.

## 2018-10-05 NOTE — Progress Notes (Signed)
Documentation for Telephone encounter:  This telephone service is not related to other E/M service within previous 7 days.  Patient consented to the consult.  This telephone consult involved patient and myself, Kristian Covey PA-C.  Subjective: He reports concerns, worried about his symptoms.  He reports 2.5 weeks ago having some SOB when he was trying to get to sleep, lasted a few seconds.   Awoke later than night ripping CPAP off his face.   This happened that one night.   In the past week has had some some chest tightness, some dyspnea, can't take full breath without feeling tight.   Has had some weird episodes feeling a little feverish intermittent.   This morning has felt the dyspnea again.   No body aches, no chills.  No change in sense of taste or smell.  No leg swelling, no numbness, no tingling.    Having some headache.  No sore throat.  No runny nose, but has had dry cough x 3 week.   Did visit his father few weeks ago in [place], [place] but no other recent travel.    Taking medications as usual.  Doesn't have a BP cuff at home.     Work contacts said he looked feverish looking, and he notes feverish feeling the last few days.     Objective: Gen: sounds a little winded on the phone, otherwise normal conversation, answers questions appropriately   Assessment: Encounter Diagnoses  Name Primary?  Marland Kitchen Dyspnea, unspecified type Yes  . Cough   . Essential hypertension, benign   . Morbid obesity (HCC)   . Impaired fasting blood sugar       Plan: We discussed symptoms , concerns.   He is moderate risk with complication of respiratory tract infection.    I advised he go home now since he was at work.   I advised he go to [facility] triage tent if worsening in the next 1-2 days.   otherwise advised self quarantine at home given current illness symptoms.  Discussed supportive care, and signs /symptom that would prompt further evaluation.  Patient Instructions  Your symptoms per  our phone consult today suggest a respiratory tract infection.  Recommendations 1- I recommend rest the next several days 2- Make sure you are hydrating well with clear fluids such as water, G2 Gatorade, soup broth and other clear fluids 3 -You can use Benadryl or other allergy medication such as Zyrtec daily for the next week.  This will help with drainage and congestion.  Given that this is allergy season though, you may want to continue allergy medicine daily for the next several weeks 4- If you develop more of a cough you can use Delsym cough suppressant over-the-counter or Robitussin-DM for cough and congestion 5- If you are significantly worse in the coming days such as fever over 101, vomiting, shortness of breath, then either call or go to the triage tent at [facility] emergency department 6-we recommend you quarantine yourself off at home to prevent spreading your illness to your work contacts or your family.  Wash your hands regularly with soap and water for 20 seconds or more, cough into your elbow or if he had a mask at home wear a mask when interacting in the kitchen or other parts of the house other people may be located 7-the current recommendations are to remain at home until you have 72 hours of no symptoms.  Your current illness would likely last the next 3 to 5 days  so theoretically you should not be back at work this week at all  I hope you get to feeling better soon!  They were advised to follow up prn   Time involving medical discussion was 12 minutes.

## 2018-10-06 ENCOUNTER — Encounter: Payer: Self-pay | Admitting: Medical

## 2018-10-06 NOTE — Telephone Encounter (Signed)
Done and emailed to pt 

## 2018-11-07 ENCOUNTER — Other Ambulatory Visit: Payer: Self-pay | Admitting: Medical

## 2018-11-08 NOTE — Telephone Encounter (Signed)
Is this ok to refill?  

## 2018-11-21 ENCOUNTER — Other Ambulatory Visit: Payer: Self-pay | Admitting: Family Medicine

## 2018-11-21 ENCOUNTER — Other Ambulatory Visit: Payer: Self-pay | Admitting: Medical

## 2018-11-21 DIAGNOSIS — I10 Essential (primary) hypertension: Secondary | ICD-10-CM

## 2018-11-29 ENCOUNTER — Other Ambulatory Visit: Payer: Self-pay | Admitting: Medical

## 2018-11-29 NOTE — Telephone Encounter (Signed)
Is this okay to refill? 

## 2019-01-03 ENCOUNTER — Other Ambulatory Visit: Payer: Self-pay | Admitting: Medical

## 2019-01-04 NOTE — Telephone Encounter (Signed)
Is this ok to refill?  

## 2019-01-07 ENCOUNTER — Other Ambulatory Visit: Payer: Self-pay | Admitting: Internal Medicine

## 2019-01-07 DIAGNOSIS — Z20822 Contact with and (suspected) exposure to covid-19: Secondary | ICD-10-CM

## 2019-01-12 LAB — NOVEL CORONAVIRUS, NAA: SARS-CoV-2, NAA: NOT DETECTED

## 2019-01-19 ENCOUNTER — Telehealth: Payer: Self-pay | Admitting: Medical

## 2019-01-19 ENCOUNTER — Ambulatory Visit: Payer: Self-pay | Admitting: *Deleted

## 2019-01-19 DIAGNOSIS — Z20822 Contact with and (suspected) exposure to covid-19: Secondary | ICD-10-CM

## 2019-01-19 NOTE — Telephone Encounter (Signed)
Scheduled for testing at [facility] site  Requested by Dr. Chana Bode  Pt symptomatic. TEsting process reviewed, wear mask, stay in car; verbalizes understanding.   Order placed previously.

## 2019-01-19 NOTE — Telephone Encounter (Signed)
Please call Santiago Glad to sched covid 19 testing for pt.   Call History   Type Contact Phone  01/19/2019 02:32 PM EDT Phone (Incoming) [place] [phone]  User: Stevenson Clinch  Symptomatic- cough, flu like symptoms  Fax- [phone]  Order placed for testing. Left message for patient to return call for scheduling of test.

## 2019-01-20 ENCOUNTER — Other Ambulatory Visit: Payer: 59

## 2019-01-20 DIAGNOSIS — Z20822 Contact with and (suspected) exposure to covid-19: Secondary | ICD-10-CM

## 2019-01-24 LAB — NOVEL CORONAVIRUS, NAA: SARS-CoV-2, NAA: NOT DETECTED

## 2019-02-02 ENCOUNTER — Other Ambulatory Visit: Payer: Self-pay | Admitting: Medical

## 2019-02-02 NOTE — Telephone Encounter (Signed)
Is this ok to refill?  

## 2019-02-12 ENCOUNTER — Other Ambulatory Visit: Payer: Self-pay | Admitting: Medical

## 2019-02-12 DIAGNOSIS — E038 Other specified hypothyroidism: Secondary | ICD-10-CM

## 2019-02-14 NOTE — Telephone Encounter (Signed)
Is this ok to refill?  

## 2019-02-15 ENCOUNTER — Other Ambulatory Visit: Payer: Self-pay | Admitting: Family Medicine

## 2019-02-15 DIAGNOSIS — I10 Essential (primary) hypertension: Secondary | ICD-10-CM

## 2019-03-03 ENCOUNTER — Other Ambulatory Visit: Payer: Self-pay | Admitting: Medical

## 2019-03-03 NOTE — Telephone Encounter (Signed)
Is this okay to refill? 

## 2019-03-15 ENCOUNTER — Other Ambulatory Visit: Payer: Self-pay | Admitting: Medical

## 2019-04-03 ENCOUNTER — Other Ambulatory Visit: Payer: Self-pay | Admitting: Medical

## 2019-05-02 ENCOUNTER — Other Ambulatory Visit: Payer: Self-pay | Admitting: Medical

## 2019-05-28 ENCOUNTER — Other Ambulatory Visit: Payer: Self-pay | Admitting: Medical

## 2019-05-28 DIAGNOSIS — I10 Essential (primary) hypertension: Secondary | ICD-10-CM

## 2019-05-28 DIAGNOSIS — E038 Other specified hypothyroidism: Secondary | ICD-10-CM

## 2019-05-30 ENCOUNTER — Other Ambulatory Visit: Payer: Self-pay | Admitting: Medical

## 2019-06-07 ENCOUNTER — Other Ambulatory Visit: Payer: Self-pay | Admitting: Medical

## 2019-06-08 ENCOUNTER — Telehealth: Payer: Self-pay | Admitting: Medical

## 2019-06-08 ENCOUNTER — Other Ambulatory Visit: Payer: Self-pay | Admitting: Medical

## 2019-06-08 MED ORDER — METOPROLOL SUCCINATE ER 50 MG PO TB24: ORAL_TABLET | ORAL | 0 refills | Status: DC

## 2019-06-08 NOTE — Telephone Encounter (Signed)
Is this okay to refill? 

## 2019-06-08 NOTE — Telephone Encounter (Signed)
Pt called and is requesting a refill on Toprol-xl pt has a cpe scheduled for Dec the 29th please send to [facility], [place] AT [address]

## 2019-07-01 ENCOUNTER — Other Ambulatory Visit: Payer: Self-pay | Admitting: Medical

## 2019-07-01 DIAGNOSIS — I10 Essential (primary) hypertension: Secondary | ICD-10-CM

## 2019-07-01 DIAGNOSIS — E038 Other specified hypothyroidism: Secondary | ICD-10-CM

## 2019-07-01 NOTE — Telephone Encounter (Signed)
It looks like we keep refilling and he keeps canceling or reschedule CPX.   Lets just get in for CPX

## 2019-07-01 NOTE — Telephone Encounter (Signed)
Pt called and needs refill on all 3 of these medications sent to the [facility] on [address]. He is scheduled for CPE 1-20

## 2019-07-03 ENCOUNTER — Other Ambulatory Visit: Payer: Self-pay | Admitting: Medical

## 2019-07-12 ENCOUNTER — Encounter: Payer: 59 | Admitting: Medical

## 2019-07-20 ENCOUNTER — Telehealth: Payer: Self-pay | Admitting: Medical

## 2019-07-20 ENCOUNTER — Other Ambulatory Visit: Payer: Self-pay | Admitting: Medical

## 2019-07-20 MED ORDER — FLUOXETINE HCL 40 MG PO CAPS: 40.0000 mg | ORAL_CAPSULE | Freq: Every day | ORAL | 0 refills | Status: DC

## 2019-07-20 NOTE — Telephone Encounter (Signed)
Pt called and requested refills on Prozac. He does have an appt scheduled. He is completely out. Pt uses [facility] on [address].

## 2019-07-20 NOTE — Telephone Encounter (Signed)
He is scheduled for 08/03/2019. He did state if we could provide enough until that appt

## 2019-07-20 NOTE — Telephone Encounter (Signed)
when is his physical appointment?    I feel like we have refilled and refilled and refilled and I believe he canceled his last appointment  So he needs to come in for his physical appointment ASAP

## 2019-07-20 NOTE — Telephone Encounter (Signed)
I sent #[phone]

## 2019-08-03 ENCOUNTER — Other Ambulatory Visit: Payer: Self-pay

## 2019-08-03 ENCOUNTER — Telehealth: Payer: Self-pay | Admitting: Medical

## 2019-08-03 ENCOUNTER — Ambulatory Visit: Payer: 59 | Admitting: Medical

## 2019-08-03 VITALS — BP 130/78 | HR 81 | Temp 98.2°F | Ht 68.0 in | Wt >= 6400 oz

## 2019-08-03 DIAGNOSIS — M1000 Idiopathic gout, unspecified site: Secondary | ICD-10-CM

## 2019-08-03 DIAGNOSIS — G4733 Obstructive sleep apnea (adult) (pediatric): Secondary | ICD-10-CM

## 2019-08-03 DIAGNOSIS — Z1211 Encounter for screening for malignant neoplasm of colon: Secondary | ICD-10-CM

## 2019-08-03 DIAGNOSIS — Z7189 Other specified counseling: Secondary | ICD-10-CM | POA: Diagnosis not present

## 2019-08-03 DIAGNOSIS — Z8249 Family history of ischemic heart disease and other diseases of the circulatory system: Secondary | ICD-10-CM

## 2019-08-03 DIAGNOSIS — Z113 Encounter for screening for infections with a predominantly sexual mode of transmission: Secondary | ICD-10-CM

## 2019-08-03 DIAGNOSIS — M4316 Spondylolisthesis, lumbar region: Secondary | ICD-10-CM

## 2019-08-03 DIAGNOSIS — Z7185 Encounter for immunization safety counseling: Secondary | ICD-10-CM

## 2019-08-03 DIAGNOSIS — Z23 Encounter for immunization: Secondary | ICD-10-CM | POA: Diagnosis not present

## 2019-08-03 DIAGNOSIS — I10 Essential (primary) hypertension: Secondary | ICD-10-CM

## 2019-08-03 DIAGNOSIS — M5136 Other intervertebral disc degeneration, lumbar region: Secondary | ICD-10-CM

## 2019-08-03 DIAGNOSIS — E559 Vitamin D deficiency, unspecified: Secondary | ICD-10-CM

## 2019-08-03 DIAGNOSIS — Z0000 Encounter for general adult medical examination without abnormal findings: Secondary | ICD-10-CM

## 2019-08-03 DIAGNOSIS — F329 Major depressive disorder, single episode, unspecified: Secondary | ICD-10-CM

## 2019-08-03 DIAGNOSIS — F419 Anxiety disorder, unspecified: Secondary | ICD-10-CM

## 2019-08-03 DIAGNOSIS — G8929 Other chronic pain: Secondary | ICD-10-CM

## 2019-08-03 DIAGNOSIS — R7301 Impaired fasting glucose: Secondary | ICD-10-CM

## 2019-08-03 DIAGNOSIS — M25562 Pain in left knee: Secondary | ICD-10-CM

## 2019-08-03 DIAGNOSIS — R7989 Other specified abnormal findings of blood chemistry: Secondary | ICD-10-CM

## 2019-08-03 DIAGNOSIS — J301 Allergic rhinitis due to pollen: Secondary | ICD-10-CM

## 2019-08-03 DIAGNOSIS — F32A Depression, unspecified: Secondary | ICD-10-CM

## 2019-08-03 DIAGNOSIS — M51369 Other intervertebral disc degeneration, lumbar region without mention of lumbar back pain or lower extremity pain: Secondary | ICD-10-CM

## 2019-08-03 DIAGNOSIS — R9431 Abnormal electrocardiogram [ECG] [EKG]: Secondary | ICD-10-CM | POA: Insufficient documentation

## 2019-08-03 DIAGNOSIS — M25561 Pain in right knee: Secondary | ICD-10-CM

## 2019-08-03 DIAGNOSIS — Z125 Encounter for screening for malignant neoplasm of prostate: Secondary | ICD-10-CM

## 2019-08-03 NOTE — Telephone Encounter (Signed)
Done

## 2019-08-03 NOTE — Progress Notes (Addendum)
Subjective:   HPI  Patrick Baxter is a 49 y.o. male who presents for Chief Complaint  Patient presents with  . Annual Exam    with fasting labs     Patient Care Team: Alfonza Toft, Kermit Balo, PA-C as PCP - General (Family Medicine) Sees dentist Sees eye doctor  Concerns: Needs refills on medications.  Compliant with medications.  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  Otherwise no complaint.  Reviewed their medical, surgical, family, social, medication, and allergy history and updated chart as appropriate.  Past Medical History:  Diagnosis Date  . Allergy   . Anxiety   . Back pain   . Bilateral chronic knee pain   . Chronic headaches   . Depression   . Dermatographism   . Dry skin   . Fatigue   . Frequent urination   . Gout   . Hypertension   . Joint pain   . Knee pain   . Obesity   . Sleep apnea   . Stress   . Wears glasses     Past Surgical History:  Procedure Laterality Date  . KNEE SURGERY  11/2006   right knee arthroscopy and meniscal repair, Dr. Conni Elliot    Social History   Socioeconomic History  . Marital status: Single    Spouse name: Not on file  . Number of children: Not on file  . Years of education: Not on file  . Highest education level: Not on file  Occupational History  . Occupation: Comptroller  Tobacco Use  . Smoking status: Former Games developer  . Smokeless tobacco: Never Used  . Tobacco comment: socially prior, not daily smoker  Substance and Sexual Activity  . Alcohol use: Yes    Alcohol/week: 3.0 standard drinks    Types: 3 Cans of beer per week  . Drug use: No  . Sexual activity: Not on file  Other Topics Concern  . Not on file  Social History Narrative   Single, works for [employer]).   No significant other.     Social Determinants of Health   Financial Resource Strain:   . Difficulty of Paying Living Expenses: Not on file  Food Insecurity:   .  Worried About Programme researcher, broadcasting/film/video in the Last Year: Not on file  . Ran Out of Food in the Last Year: Not on file  Transportation Needs:   . Lack of Transportation (Medical): Not on file  . Lack of Transportation (Non-Medical): Not on file  Physical Activity:   . Days of Exercise per Week: Not on file  . Minutes of Exercise per Session: Not on file  Stress:   . Feeling of Stress : Not on file  Social Connections:   . Frequency of Communication with Friends and Family: Not on file  . Frequency of Social Gatherings with Friends and Family: Not on file  . Attends Religious Services: Not on file  . Active Member of Clubs or Organizations: Not on file  . Attends Banker Meetings: Not on file  . Marital Status: Not on file  Intimate Partner Violence:   . Fear of Current or Ex-Partner: Not on file  . Emotionally Abused: Not on file  . Physically Abused: Not on file  . Sexually Abused: Not on file    Family History  Problem Relation Age of Onset  . Alzheimer's disease Mother   . Heart  disease Mother        86, angioplasty, CAD  . Cancer Mother        breast  . Stroke Mother   . Hypertension Mother   . Hyperlipidemia Mother   . Heart disease Father        angina  . Arthritis Father   . Cancer Maternal Grandmother        leukemia  . Diabetes Paternal Grandmother      Current Outpatient Medications:  .  allopurinol (ZYLOPRIM) 300 MG tablet, TAKE 1 TABLET(300 MG) BY MOUTH DAILY, Disp: 30 tablet, Rfl: 0 .  B Complex-C (PROBEC-T PO), Take by mouth., Disp: , Rfl:  .  Calcium-Magnesium-Zinc 167-83-8 MG TABS, Take 1 tablet by mouth daily., Disp: , Rfl:  .  cholecalciferol (VITAMIN D) 1000 units tablet, Take 1 tablet (1,000 Units total) by mouth daily., Disp: 90 tablet, Rfl: 3 .  Coenzyme Q10 (CO Q 10 PO), Take by mouth., Disp: , Rfl:  .  colchicine 0.6 MG tablet, Take 1 tablet (0.6 mg total) by mouth 2 (two) times daily., Disp: 30 tablet, Rfl: 0 .  cyclobenzaprine  (FLEXERIL) 10 MG tablet, TAKE 1/2 TO 1 TABLET BY MOUTH EVERY NIGHT AT BEDTIME AS NEEDED, Disp: 30 tablet, Rfl: 0 .  FLUoxetine (PROZAC) 40 MG capsule, Take 1 capsule (40 mg total) by mouth daily., Disp: 15 capsule, Rfl: 0 .  levothyroxine (SYNTHROID) 25 MCG tablet, TAKE 1 TABLET(25 MCG) BY MOUTH DAILY BEFORE BREAKFAST, Disp: 30 tablet, Rfl: 0 .  lisinopril (ZESTRIL) 10 MG tablet, TAKE 1 TABLET(10 MG) BY MOUTH DAILY, Disp: 30 tablet, Rfl: 0 .  metoprolol succinate (TOPROL-XL) 50 MG 24 hr tablet, TAKE 1 TABLET BY MOUTH DAILY WITH OR IMMEDIATELY FOLLOWING A MEAL., Disp: 30 tablet, Rfl: 0 .  montelukast (SINGULAIR) 10 MG tablet, TAKE 1 TABLET(10 MG) BY MOUTH AT BEDTIME, Disp: 90 tablet, Rfl: 3 .  Multiple Vitamins-Minerals (MULTIVITAMIN WITH MINERALS) tablet, Take 1 tablet by mouth daily., Disp: 90 tablet, Rfl: 3 .  naproxen (NAPROSYN) 250 MG tablet, Take by mouth 3 (three) times daily with meals., Disp: , Rfl:  .  Omega-3 Fatty Acids (SALMON OIL-1000) 200 MG CAPS, Take by mouth., Disp: , Rfl:  .  vitamin B-12 (CYANOCOBALAMIN) 1000 MCG tablet, Take 1 tablet (1,000 mcg total) by mouth daily., Disp: 90 tablet, Rfl: 3 .  ergocalciferol (VITAMIN D2) 1.25 MG (50000 UT) capsule, ergocalciferol (vitamin D2) 1,250 mcg (50,000 unit) capsule  TK ONE C PO Q 7 DAYS, Disp: , Rfl:  .  Hyaluronic Acid 20-60 MG CAPS, Take 120 mg by mouth., Disp: , Rfl:  .  Spirulina 500 MG TABS, Take 1 tablet by mouth 2 (two) times daily., Disp: , Rfl:   No Known Allergies   Review of Systems Constitutional: -fever, -chills, -sweats, -unexpected weight change, -decreased appetite, -fatigue Allergy: -sneezing, -itching, -congestion Dermatology: -changing moles, --rash, -lumps ENT: -runny nose, -ear pain, -sore throat, -hoarseness, -sinus pain, -teeth pain, - ringing in ears, -hearing loss, -nosebleeds Cardiology: -chest pain, -palpitations, -swelling, -difficulty breathing when lying flat, -waking up short of breath Respiratory:  -cough, -shortness of breath, -difficulty breathing with exercise or exertion, -wheezing, -coughing up blood Gastroenterology: -abdominal pain, -nausea, -vomiting, -diarrhea, -constipation, -blood in stool, -changes in bowel movement, -difficulty swallowing or eating Hematology: -bleeding, -bruising  Musculoskeletal: + Right wrist pain, -joint aches, -muscle aches, -joint swelling, -back pain, -neck pain, -cramping, -changes in gait Ophthalmology: denies vision changes, eye redness, itching, discharge Urology: -burning with urination, -  difficulty urinating, -blood in urine, -urinary frequency, -urgency, -incontinence Neurology: -headache, -weakness, -tingling, -numbness, -memory loss, -falls, -dizziness Psychology: -depressed mood, -agitation, -sleep problems Male GU: no testicular mass, pain, no lymph nodes swollen, no swelling, no rash.     Objective:  BP 130/78   Pulse 81   Temp 98.2 F (36.8 C)   Ht 5\' 8"  (1.727 m)   Wt (!) 459 lb (208.2 kg)   SpO2 98%   BMI 69.79 kg/m   General appearance: alert, no distress, WD/WN, morbidly obese white male  skin: Unremarkable HEENT: normocephalic, conjunctiva/corneas normal, sclerae anicteric, PERRLA, EOMi, nares patent, no discharge or erythema, pharynx normal Oral cavity: MMM, tongue normal, teeth normal Neck: supple, no lymphadenopathy, no thyromegaly, no masses, normal ROM, no bruits Chest: non tender, normal shape and expansion Heart: RRR, normal S1, S2, no murmurs Lungs: CTA bilaterally, no wheezes, rhonchi, or rales Abdomen: +bs, soft, non tender, non distended, no masses, no hepatomegaly, no splenomegaly, no bruits Back: non tender, normal ROM, no scoliosis Musculoskeletal: upper extremities non tender, no obvious deformity, normal ROM throughout, lower extremities non tender, no obvious deformity, normal ROM throughout Extremities: no edema, no cyanosis, no clubbing Pulses: 2+ symmetric, upper and lower extremities, normal cap  refill Neurological: alert, oriented x 3, CN2-12 intact, strength normal upper extremities and lower extremities, sensation normal throughout, DTRs 2+ throughout, no cerebellar signs, gait normal Psychiatric: normal affect, behavior normal, pleasant  GU: Deferred Rectal: Deferred  EKG indication physical, hypertension, rate 84 bpm, PR 152 ms, QRS 94 ms, QTC 430 ms, axis -9 degrees, sinus rhythm with occasional PVC, incomplete right bundle branch block, new T wave inversions in the precordial leads throughout, no other new findings.  Abnormal EKG with new findings of T wave inversion suggesting ischemia compared to prior EKG   Assessment and Plan :   Encounter Diagnoses  Name Primary?  . Encounter for health maintenance examination in adult Yes  . Vaccine counseling   . Need for influenza vaccination   . Essential hypertension   . OSA (obstructive sleep apnea)   . Impaired fasting blood sugar   . Seasonal allergic rhinitis due to pollen   . DDD (degenerative disc disease), lumbar   . Spondylolisthesis of lumbar region   . Anxiety and depression   . Idiopathic gout, unspecified chronicity, unspecified site   . Morbid obesity (HCC)   . Screening for prostate cancer   . Family history of premature CAD   . Chronic pain of both knees   . Abnormal thyroid blood test   . Vitamin D deficiency   . Screen for STD (sexually transmitted disease)   . Screen for colon cancer   . Abnormal EKG     Physical exam - discussed and counseled on healthy lifestyle, diet, exercise, preventative care, vaccinations, sick and well care, proper use of emergency dept and after hours care, and addressed their concerns.    Health screening: See your eye doctor yearly for routine vision care. See your dentist yearly for routine dental care including hygiene visits twice yearly.  Discussed STD testing, discussed prevention, condom use, means of transmission  Cancer screening We will refer for screening  colonoscopy  Discussed PSA, prostate exam, and prostate cancer screening risks/benefits.     Tetanus vaccine up-to-date  Counseled on shingles and Covid vaccine.  He will consider.  Separate significant chronic issues discussed: New EKG findings today suggestive of possible ischemia.  Referral to cardiology  I strongly advised him to have  a consult with bariatric surgery clinic.  He seems to give me agreements today whereas in the past he is not then open to do this.  I reminded him that over the several years abdomen him he has not lost any weight, he has developed high blood pressure, has developed worsening knee pains and arthritis, has had impaired glucose and has had several comorbidities to develop in the short few years have seen him.  So nothing is improving.    Pending cardiology consult, plan to pursue bariatric consult   Ashlin was seen today for annual exam.  Diagnoses and all orders for this visit:  Encounter for health maintenance examination in adult -     Comprehensive metabolic panel -     CBC with Differential/Platelet -     Lipid panel -     Hemoglobin A1c -     TSH -     HIV Antibody (routine testing w rflx) -     RPR -     GC/Chlamydia Probe Amp -     PSA -     EKG 12-Lead -     Ambulatory referral to Gastroenterology -     Ambulatory referral to Cardiology  Vaccine counseling  Need for influenza vaccination  Essential hypertension -     EKG 12-Lead -     Ambulatory referral to Cardiology  OSA (obstructive sleep apnea)  Impaired fasting blood sugar -     Hemoglobin A1c  Seasonal allergic rhinitis due to pollen  DDD (degenerative disc disease), lumbar  Spondylolisthesis of lumbar region  Anxiety and depression  Idiopathic gout, unspecified chronicity, unspecified site  Morbid obesity ([place])  Screening for prostate cancer -     PSA  Family history of premature CAD -     EKG 12-Lead  Chronic pain of both knees  Abnormal thyroid blood  test -     TSH  Vitamin D deficiency  Screen for STD (sexually transmitted disease) -     HIV Antibody (routine testing w rflx) -     RPR -     GC/Chlamydia Probe Amp  Screen for colon cancer -     Ambulatory referral to Gastroenterology  Abnormal EKG -     Ambulatory referral to Cardiology    Follow-up pending labs, yearly for physical

## 2019-08-03 NOTE — Telephone Encounter (Signed)
1-refer to cardiology for abnormal EKG, new findings compared to prior EKG, uncontrolled sleep apnea, severe obesity  2-refer for screening colonoscopy, Dr. Loreta Ave or Dr. Elnoria Howard

## 2019-08-04 ENCOUNTER — Other Ambulatory Visit: Payer: Self-pay | Admitting: Medical

## 2019-08-04 DIAGNOSIS — I10 Essential (primary) hypertension: Secondary | ICD-10-CM

## 2019-08-04 DIAGNOSIS — E038 Other specified hypothyroidism: Secondary | ICD-10-CM

## 2019-08-04 LAB — COMPREHENSIVE METABOLIC PANEL
ALT: 43 IU/L (ref 0–44)
AST: 36 IU/L (ref 0–40)
Albumin/Globulin Ratio: 1.3 (ref 1.2–2.2)
Albumin: 4.3 g/dL (ref 4.0–5.0)
Alkaline Phosphatase: 91 IU/L (ref 39–117)
BUN/Creatinine Ratio: 24 — ABNORMAL HIGH (ref 9–20)
BUN: 19 mg/dL (ref 6–24)
Bilirubin Total: 0.5 mg/dL (ref 0.0–1.2)
CO2: 25 mmol/L (ref 20–29)
Calcium: 9.4 mg/dL (ref 8.7–10.2)
Chloride: 100 mmol/L (ref 96–106)
Creatinine, Ser: 0.79 mg/dL (ref 0.76–1.27)
GFR calc Af Amer: 121 mL/min/{1.73_m2} (ref >59)
GFR calc non Af Amer: 105 mL/min/{1.73_m2} (ref >59)
Globulin, Total: 3.3 g/dL (ref 1.5–4.5)
Glucose: 103 mg/dL — ABNORMAL HIGH (ref 65–99)
Potassium: 4.5 mmol/L (ref 3.5–5.2)
Sodium: 141 mmol/L (ref 134–144)
Total Protein: 7.6 g/dL (ref 6.0–8.5)

## 2019-08-04 LAB — TSH: TSH: 3.17 u[IU]/mL (ref 0.450–4.500)

## 2019-08-04 LAB — RPR: RPR Ser Ql: NONREACTIVE

## 2019-08-04 LAB — CBC WITH DIFFERENTIAL/PLATELET
Basophils Absolute: 0.1 10*3/uL (ref 0.0–0.2)
Basos: 1 %
EOS (ABSOLUTE): 0.2 10*3/uL (ref 0.0–0.4)
Eos: 2 %
Hematocrit: 44.9 % (ref 37.5–51.0)
Hemoglobin: 15.2 g/dL (ref 13.0–17.7)
Immature Grans (Abs): 0.1 10*3/uL (ref 0.0–0.1)
Immature Granulocytes: 1 %
Lymphocytes Absolute: 1.7 10*3/uL (ref 0.7–3.1)
Lymphs: 19 %
MCH: 30.9 pg (ref 26.6–33.0)
MCHC: 33.9 g/dL (ref 31.5–35.7)
MCV: 91 fL (ref 79–97)
Monocytes Absolute: 0.9 10*3/uL (ref 0.1–0.9)
Monocytes: 9 %
Neutrophils Absolute: 6.4 10*3/uL (ref 1.4–7.0)
Neutrophils: 68 %
Platelets: 191 10*3/uL (ref 150–450)
RBC: 4.92 x10E6/uL (ref 4.14–5.80)
RDW: 13.2 % (ref 11.6–15.4)
WBC: 9.3 10*3/uL (ref 3.4–10.8)

## 2019-08-04 LAB — HEMOGLOBIN A1C
Est. average glucose Bld gHb Est-mCnc: 131 mg/dL
Hgb A1c MFr Bld: 6.2 % — ABNORMAL HIGH (ref 4.8–5.6)

## 2019-08-04 LAB — LIPID PANEL
Chol/HDL Ratio: 5.1 ratio — ABNORMAL HIGH (ref 0.0–5.0)
Cholesterol, Total: 189 mg/dL (ref 100–199)
HDL: 37 mg/dL — ABNORMAL LOW (ref >39)
LDL Chol Calc (NIH): 123 mg/dL — ABNORMAL HIGH (ref 0–99)
Triglycerides: 162 mg/dL — ABNORMAL HIGH (ref 0–149)
VLDL Cholesterol Cal: 29 mg/dL (ref 5–40)

## 2019-08-04 LAB — GC/CHLAMYDIA PROBE AMP
Chlamydia trachomatis, NAA: NEGATIVE
Neisseria Gonorrhoeae by PCR: NEGATIVE

## 2019-08-04 LAB — HIV ANTIBODY (ROUTINE TESTING W REFLEX): HIV Screen 4th Generation wRfx: NONREACTIVE

## 2019-08-04 LAB — PSA: Prostate Specific Ag, Serum: 0.2 ng/mL (ref 0.0–4.0)

## 2019-08-04 MED ORDER — ERGOCALCIFEROL 1.25 MG (50000 UT) PO CAPS: ORAL_CAPSULE | ORAL | 3 refills | Status: DC

## 2019-08-04 MED ORDER — ALLOPURINOL 300 MG PO TABS: ORAL_TABLET | ORAL | 3 refills | Status: DC

## 2019-08-04 MED ORDER — METOPROLOL SUCCINATE ER 50 MG PO TB24: ORAL_TABLET | ORAL | 3 refills | Status: DC

## 2019-08-04 MED ORDER — FLUOXETINE HCL 40 MG PO CAPS: 40.0000 mg | ORAL_CAPSULE | Freq: Every day | ORAL | 1 refills | Status: DC

## 2019-08-04 MED ORDER — LISINOPRIL 10 MG PO TABS: ORAL_TABLET | ORAL | 3 refills | Status: DC

## 2019-08-04 MED ORDER — LEVOTHYROXINE SODIUM 25 MCG PO TABS: ORAL_TABLET | ORAL | 3 refills | Status: DC

## 2019-08-30 ENCOUNTER — Ambulatory Visit: Payer: 59 | Admitting: Cardiology

## 2019-08-30 ENCOUNTER — Other Ambulatory Visit: Payer: Self-pay

## 2019-08-30 ENCOUNTER — Encounter: Payer: Self-pay | Admitting: Cardiology

## 2019-08-30 VITALS — BP 160/85 | HR 80 | Temp 97.2°F | Ht 68.0 in | Wt >= 6400 oz

## 2019-08-30 DIAGNOSIS — Z6841 Body Mass Index (BMI) 40.0 and over, adult: Secondary | ICD-10-CM

## 2019-08-30 DIAGNOSIS — G4733 Obstructive sleep apnea (adult) (pediatric): Secondary | ICD-10-CM

## 2019-08-30 DIAGNOSIS — I10 Essential (primary) hypertension: Secondary | ICD-10-CM

## 2019-08-30 DIAGNOSIS — E782 Mixed hyperlipidemia: Secondary | ICD-10-CM

## 2019-08-30 DIAGNOSIS — Z8249 Family history of ischemic heart disease and other diseases of the circulatory system: Secondary | ICD-10-CM

## 2019-08-30 DIAGNOSIS — Z9989 Dependence on other enabling machines and devices: Secondary | ICD-10-CM

## 2019-08-30 DIAGNOSIS — R0609 Other forms of dyspnea: Secondary | ICD-10-CM

## 2019-08-30 DIAGNOSIS — R7303 Prediabetes: Secondary | ICD-10-CM

## 2019-08-30 DIAGNOSIS — R9431 Abnormal electrocardiogram [ECG] [EKG]: Secondary | ICD-10-CM

## 2019-08-30 MED ORDER — NITROGLYCERIN 0.4 MG SL SUBL: 0.4000 mg | SUBLINGUAL_TABLET | SUBLINGUAL | 1 refills | Status: DC | PRN

## 2019-08-30 MED ORDER — HYDROCHLOROTHIAZIDE 12.5 MG PO CAPS: 12.5000 mg | ORAL_CAPSULE | Freq: Every day | ORAL | 3 refills | Status: DC

## 2019-08-30 MED ORDER — LISINOPRIL 20 MG PO TABS: 20.0000 mg | ORAL_TABLET | Freq: Every day | ORAL | 1 refills | Status: DC

## 2019-08-30 MED ORDER — ATORVASTATIN CALCIUM 20 MG PO TABS: 20.0000 mg | ORAL_TABLET | Freq: Every day | ORAL | 3 refills | Status: DC

## 2019-08-30 NOTE — Progress Notes (Signed)
REASON FOR CONSULT: Abnormal EKG  Chief Complaint  Patient presents with  . Hypertension  . Abnormal ECG  . New Patient (Initial Visit)    REQUESTING PHYSICIAN:  Jac Canavan, PA-C [address] [place],  [place] [address]  HPI  Patrick Baxter is a 49 y.o. male who presents to the office with a chief complaint of " abnormal EKG and elevated blood pressures."  Patient's past medical history and cardiac risk factors include: Prediabetes, morbid obesity class III, hypertension, obstructive sleep apnea, and mixed hyperlipidemia.  Patient is unaccompanied at today's office visit.  Patient is referred to the office of the request of his primary care office for abnormal EKG.   Patient had an EKG at his primary care's office on August 04, 2019 which showed normal sinus rhythm with a ventricular rate of approximately 80 bpm, incomplete right bundle branch block, and ST-T changes in the high lateral and lateral leads suggestive of possible lateral ischemia.  As result patient was referred to the office for further evaluation.  Patient states that this EKG was performed as a part of his yearly physical.  Symptomatically patient does have shortness of breath.  Initially thought it was most likely secondary to being obese and wearing a mask due to COVID-19 precautions.  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  And when he carries several bags he does experience effort related dyspnea.  At times it is associated with some chest tightness.  The tightness is located substernally, nonradiating no improving or worsening factors, resolves within 30 seconds.  The intensity, frequency, and/or duration has not changed as per patient's recollection.  Patient states that he never had to rest due to his underlying symptoms nor did does his symptoms wake him up at night.  Elevated blood pressures: Patient carries a history of hypertension for which he  is being treated with lisinopril 10 mg p.o. daily and metoprolol.  Patient states that he does not check his blood pressures at home but whenever it distracted it is usually ranging between 120-130 mmHg.  Patient denies any endorgan damage except the ones noted above.  He is educated on the importance of a low-salt diet.  Review of systems positive for: Dyspnea on exertion and chest tightness as described above, elevated blood pressures. Currently patient denies lightheadedness, dizziness, palpitations, orthopnea, paroxysmal nocturnal dyspnea, lower extremity swelling, near syncope, syncopal events, hematochezia, hemoptysis, hematemesis, melanotic stools, no symptoms of amaurosis fugax, motor or sensory symptoms or dysphasia in the last 6 months.   Denies prior history of coronary artery disease, myocardial infarction, congestive heart failure, deep venous thrombosis, pulmonary embolism, stroke, transient ischemic attack.  FUNCTIONAL STATUS: Does not anticipate any fracture or exercise routine.  ALLERGIES: No Known Allergies   MEDICATION LIST PRIOR TO VISIT: Current Outpatient Medications on File Prior to Visit  Medication Sig Dispense Refill  . allopurinol (ZYLOPRIM) 300 MG tablet TAKE 1 TABLET(300 MG) BY MOUTH DAILY 90 tablet 3  . B Complex-C (PROBEC-T PO) Take by mouth.    . Calcium-Magnesium-Zinc 167-83-8 MG TABS Take 1 tablet by mouth daily.    . cholecalciferol (VITAMIN D) 1000 units tablet Take 1 tablet (1,000 Units total) by mouth daily. 90 tablet 3  . Coenzyme Q10 (CO Q 10 PO) Take by mouth.    . cyclobenzaprine (FLEXERIL) 10 MG tablet TAKE 1/2 TO 1 TABLET BY MOUTH EVERY NIGHT AT BEDTIME AS NEEDED 30 tablet 0  .  FLUoxetine (PROZAC) 40 MG capsule Take 1 capsule (40 mg total) by mouth daily. 90 capsule 1  . levothyroxine (SYNTHROID) 25 MCG tablet TAKE 1 TABLET(25 MCG) BY MOUTH DAILY BEFORE BREAKFAST 90 tablet 3  . montelukast (SINGULAIR) 10 MG tablet TAKE 1 TABLET(10 MG) BY MOUTH AT  BEDTIME 90 tablet 3  . Multiple Vitamins-Minerals (MULTIVITAMIN WITH MINERALS) tablet Take 1 tablet by mouth daily. 90 tablet 3  . naproxen (NAPROSYN) 250 MG tablet Take by mouth 3 (three) times daily with meals.    . Omega-3 Fatty Acids (SALMON OIL-1000) 200 MG CAPS Take by mouth.    . Hyaluronic Acid 20-60 MG CAPS Take 120 mg by mouth.    . metoprolol succinate (TOPROL-XL) 50 MG 24 hr tablet TAKE 1 TABLET BY MOUTH DAILY WITH OR IMMEDIATELY FOLLOWING A MEAL. 90 tablet 3   No current facility-administered medications on file prior to visit.    PAST MEDICAL HISTORY: Past Medical History:  Diagnosis Date  . Allergy   . Anxiety   . Back pain   . Bilateral chronic knee pain   . Chronic headaches   . Depression   . Dermatographism   . Dry skin   . Fatigue   . Frequent urination   . Gout   . Hypertension   . Joint pain   . Knee pain   . Obesity   . Pre-diabetes   . Sleep apnea   . Stress   . Wears glasses     PAST SURGICAL HISTORY: Past Surgical History:  Procedure Laterality Date  . KNEE SURGERY  11/2006   right knee arthroscopy and meniscal repair, Dr. Lanny Cramp    FAMILY HISTORY: The patient family history includes Alzheimer's disease in his mother; Arthritis in his father; Cancer in his maternal grandmother and mother; Diabetes in his paternal grandmother; Heart disease in his father and mother; Hyperlipidemia in his mother; Hypertension in his mother; Stroke in his mother.   SOCIAL HISTORY:  The patient  reports that he has quit smoking. He has never used smokeless tobacco. He reports current alcohol use of about 3.0 standard drinks of alcohol per week. He reports that he does not use drugs.  14 ORGAN REVIEW OF SYSTEMS: CONSTITUTIONAL: No fever or significant weight loss EYES: No recent significant visual change EARS, NOSE, MOUTH, THROAT: No recent significant change in hearing CARDIOVASCULAR: See discussion in subjective/HPI RESPIRATORY: See discussion in  subjective/HPI GASTROINTESTINAL: No recent complaints of abdominal pain GENITOURINARY: No recent significant change in genitourinary status MUSCULOSKELETAL: No recent significant change in musculoskeletal status INTEGUMENTARY: No recent rash NEUROLOGIC: No recent significant change in motor function PSYCHIATRIC: No recent significant change in mood ENDOCRINOLOGIC: No recent significant change in endocrine status HEMATOLOGIC/LYMPHATIC: No recent significant unexpected bruising ALLERGIC/IMMUNOLOGIC: No recent unexplained allergic reaction  PHYSICAL EXAM: Vitals with BMI 08/30/2019 08/30/2019 08/03/2019  Height - 5\' 8"  5\' 8"   Weight - 462 lbs 459 lbs  BMI - 40.98 11.91  Systolic 478 295 621  Diastolic 85 90 78  Pulse 80 93 81   Body mass index is 70.25 kg/m.  CONSTITUTIONAL: Well-developed and well-nourished. No acute distress.  SKIN: Skin is warm and dry. No rash noted. No cyanosis. No pallor. No jaundice HEAD: Normocephalic and atraumatic.  EYES: No scleral icterus MOUTH/THROAT: Moist oral membranes.  NECK: No JVD present. No thyromegaly noted. No carotid bruits  LYMPHATIC: No visible cervical adenopathy.  CHEST Normal respiratory effort. No intercostal retractions  LUNGS: Clear to auscultation bilaterally.  No stridor. No  wheezes. No rales.  CARDIOVASCULAR: Regular positive S1-S2, no murmurs rubs or gallops appreciated. ABDOMINAL: Morbidly obese, soft, nontender, nondistended, positive bowel sounds in all 4 quadrants.  No apparent ascites.  EXTREMITIES: Warm to touch, +1 pitting edema bilaterally, no peripheral edema  HEMATOLOGIC: No significant bruising NEUROLOGIC: Oriented to person, place, and time. Nonfocal. Normal muscle tone.  PSYCHIATRIC: Normal mood and affect. Normal behavior. Cooperative  CARDIAC DATABASE: EKG: August 04, 2019 which showed normal sinus rhythm with a ventricular rate of approximately 80 bpm, incomplete right bundle branch block, and ST-T changes in the  high lateral and lateral leads suggestive of possible lateral ischemia.   08/30/2019 at 4:04 PM: Normal sinus rhythm with a ventricular rate of 87 bpm, incomplete right bundle branch block, ST-T changes in the high lateral lateral leads.  Overall sensitivity of the ECG is reduced secondary to baseline artifact.  Echocardiogram:  09/29/2017: LVEF 65-70%, normal wall motion, no regional wall motion abnormalities, moderately dilated left atrium, mild TR.  Stress Testing:  None  Heart Catheterization: None  LABORATORY DATA: CBC Latest Ref Rng & Units 08/03/2019 09/15/2017 10/28/2016  WBC 3.4 - 10.8 x10E3/uL 9.3 6.9 7.7  Hemoglobin 13.0 - 17.7 g/dL 06.3 01.6 01.0  Hematocrit 37.5 - 51.0 % 44.9 43.4 42.8  Platelets 150 - 450 x10E3/uL 191 162 170    CMP Latest Ref Rng & Units 08/03/2019 09/15/2017 10/28/2016  Glucose 65 - 99 mg/dL 932(T) 557(D) 98  BUN 6 - 24 mg/dL 19 18 21   Creatinine 0.76 - 1.27 mg/dL 2.20 2.54  Sodium 134 - 144 mmol/L 141 141 140  Potassium 3.5 - 5.2 mmol/L 4.5 4.7 4.5  Chloride 96 - 106 mmol/L 100 100 103  CO2 20 - 29 mmol/L 25 25 25   Calcium 8.7 - 10.2 mg/dL 9.4 8.9 9.3  Total Protein 6.0 - 8.5 g/dL 7.6 7.1 7.1  Total Bilirubin 0.0 - 1.2 mg/dL 0.5 0.3 0.5  Alkaline Phos 39 - 117 IU/L 91 91 84  AST 0 - 40 IU/L 36 20 21  ALT 0 - 44 IU/L 43 22 22    Lipid Panel     Component Value Date/Time   CHOL 189 08/03/2019 1500   TRIG 162 (H) 08/03/2019 1500   HDL 37 (L) 08/03/2019 1500   CHOLHDL 5.1 (H) 08/03/2019 1500   CHOLHDL 4.9 10/28/2016 0917   VLDL 26 10/28/2016 0917   LDLCALC 123 (H) 08/03/2019 1500   LABVLDL 29 08/03/2019 1500    Lab Results  Component Value Date   HGBA1C 6.2 (H) 08/03/2019   HGBA1C 5.7 (H) 06/16/2018   HGBA1C 5.9 (H) 09/15/2017   No components found for: NTPROBNP Lab Results  Component Value Date   TSH 3.170 08/03/2019   TSH 2.600 06/16/2018   TSH 2.620 11/17/2017    FINAL MEDICATION LIST END OF ENCOUNTER: Meds ordered this  encounter  Medications  . lisinopril (ZESTRIL) 20 MG tablet    Sig: Take 1 tablet (20 mg total) by mouth daily.    Dispense:  30 tablet    Refill:  1  . hydrochlorothiazide (MICROZIDE) 12.5 MG capsule    Sig: Take 1 capsule (12.5 mg total) by mouth daily.    Dispense:  90 capsule    Refill:  3  . atorvastatin (LIPITOR) 20 MG tablet    Sig: Take 1 tablet (20 mg total) by mouth at bedtime.    Dispense:  90 tablet    Refill:  3  . nitroGLYCERIN (NITROSTAT) 0.4 MG  SL tablet    Sig: Place 1 tablet (0.4 mg total) under the tongue every 5 (five) minutes as needed for chest pain.    Dispense:  30 tablet    Refill:  1    Medications Discontinued During This Encounter  Medication Reason  . colchicine 0.6 MG tablet Error  . ergocalciferol (VITAMIN D2) 1.25 MG (50000 UT) capsule Duplicate  . Spirulina 500 MG TABS Error  . lisinopril (ZESTRIL) 10 MG tablet      Current Outpatient Medications:  .  allopurinol (ZYLOPRIM) 300 MG tablet, TAKE 1 TABLET(300 MG) BY MOUTH DAILY, Disp: 90 tablet, Rfl: 3 .  B Complex-C (PROBEC-T PO), Take by mouth., Disp: , Rfl:  .  Calcium-Magnesium-Zinc 167-83-8 MG TABS, Take 1 tablet by mouth daily., Disp: , Rfl:  .  cholecalciferol (VITAMIN D) 1000 units tablet, Take 1 tablet (1,000 Units total) by mouth daily., Disp: 90 tablet, Rfl: 3 .  Coenzyme Q10 (CO Q 10 PO), Take by mouth., Disp: , Rfl:  .  cyclobenzaprine (FLEXERIL) 10 MG tablet, TAKE 1/2 TO 1 TABLET BY MOUTH EVERY NIGHT AT BEDTIME AS NEEDED, Disp: 30 tablet, Rfl: 0 .  FLUoxetine (PROZAC) 40 MG capsule, Take 1 capsule (40 mg total) by mouth daily., Disp: 90 capsule, Rfl: 1 .  levothyroxine (SYNTHROID) 25 MCG tablet, TAKE 1 TABLET(25 MCG) BY MOUTH DAILY BEFORE BREAKFAST, Disp: 90 tablet, Rfl: 3 .  lisinopril (ZESTRIL) 20 MG tablet, Take 1 tablet (20 mg total) by mouth daily., Disp: 30 tablet, Rfl: 1 .  montelukast (SINGULAIR) 10 MG tablet, TAKE 1 TABLET(10 MG) BY MOUTH AT BEDTIME, Disp: 90 tablet, Rfl: 3 .   Multiple Vitamins-Minerals (MULTIVITAMIN WITH MINERALS) tablet, Take 1 tablet by mouth daily., Disp: 90 tablet, Rfl: 3 .  naproxen (NAPROSYN) 250 MG tablet, Take by mouth 3 (three) times daily with meals., Disp: , Rfl:  .  Omega-3 Fatty Acids (SALMON OIL-1000) 200 MG CAPS, Take by mouth., Disp: , Rfl:  .  atorvastatin (LIPITOR) 20 MG tablet, Take 1 tablet (20 mg total) by mouth at bedtime., Disp: 90 tablet, Rfl: 3 .  Hyaluronic Acid 20-60 MG CAPS, Take 120 mg by mouth., Disp: , Rfl:  .  hydrochlorothiazide (MICROZIDE) 12.5 MG capsule, Take 1 capsule (12.5 mg total) by mouth daily., Disp: 90 capsule, Rfl: 3 .  metoprolol succinate (TOPROL-XL) 50 MG 24 hr tablet, TAKE 1 TABLET BY MOUTH DAILY WITH OR IMMEDIATELY FOLLOWING A MEAL., Disp: 90 tablet, Rfl: 3 .  nitroGLYCERIN (NITROSTAT) 0.4 MG SL tablet, Place 1 tablet (0.4 mg total) under the tongue every 5 (five) minutes as needed for chest pain., Disp: 30 tablet, Rfl: 1  IMPRESSION:    ICD-10-CM   1. Abnormal EKG  R94.31 EKG 12-Lead    PCV ECHOCARDIOGRAM COMPLETE    PCV MYOCARDIAL PERFUSION WITH LEXISCAN    nitroGLYCERIN (NITROSTAT) 0.4 MG SL tablet  2. Dyspnea on exertion  R06.00 PCV ECHOCARDIOGRAM COMPLETE    PCV MYOCARDIAL PERFUSION WITH LEXISCAN    hydrochlorothiazide (MICROZIDE) 12.5 MG capsule    BASIC METABOLIC PANEL WITH GFR    Pro b natriuretic peptide (BNP)[facility])    Magnesium    nitroGLYCERIN (NITROSTAT) 0.4 MG SL tablet    CANCELED: CBC with Differential/Platelet  3. Essential hypertension  I10 lisinopril (ZESTRIL) 20 MG tablet    hydrochlorothiazide (MICROZIDE) 12.5 MG capsule    Magnesium  4. OSA on CPAP  G47.33    Z99.89   5. Family history of heart  disease  Z82.49   6. Pre-diabetes  R73.03 atorvastatin (LIPITOR) 20 MG tablet  7. Class 3 severe obesity due to excess calories with serious comorbidity and body mass index (BMI) greater than or equal to 70 in adult (HCC)  E66.01    Z68.45   8. Mixed  hyperlipidemia  E78.2 atorvastatin (LIPITOR) 20 MG tablet     RECOMMENDATIONS: Abnormal EKG:  Prior ECG today's ECG both were reviewed and interpreted findings noted above.  Plan echocardiogram to evaluate for structural heart disease and LV function given the new EKG changes and symptoms.  Plan nuclear stress test to rule out reversible ischemia.  Patient symptoms of chest tightness of both typical and atypical features.  Patient is encouraged to seek medical attention of the closest ER if his symptoms increase in intensity, frequency, duration or have typical chest pain as discussed in the office.  Discussed the use of sublingual nitroglycerin tablets on a as needed basis as needed.  Patient is asked to seek medical attention at the closest ER via EMS if he requires more than 2 tablets.  Patient verbalized understanding.  He also verbalized understanding that he should not be on erectile dysfunction medication/BPH medication such as phosphodiesterase inhibitors as his class of medication can construct drug interactions with concomitant nitrate medications such that these medications can potentially lead to worsening morbidity and/or mortality.  Dyspnea on exertion: See above  Precordial chest pain: See above  Benign essential hypertension:  Blood pressure recheck shows improvement in his systolic blood pressure but still not at goal.  Patient is asked to keep a log of his blood pressure and to invest into a sphygmomanometer.  Patient is encouraged to bring his blood pressure log in the next office visit.  Increase lisinopril to 20 mg daily p.m.  Add hydrochlorothiazide 12.5 mg p.o. daily.  Patient is aware that hydrochlorothiazide may cause a gout flareup and therefore needs to be cognizant of such symptoms.  We will continue to monitor.  Patient also does not take allopurinol on a daily basis however due to the initiation of hydrochlorothiazide he is recommended to do so as  prescribed.  Check BMP, NT proBNP in 1 week.  Hyperlipidemia:  Patient estimated 10-year risk of ASCVD is 9.2% after detailed discussion the shared decision was to initiate statin therapy.  Start Lipitor 20 mg p.o. nightly.  Medication profile discussed with the patient.  Most recent AST and ALT levels reviewed from January 2021.  Orders Placed This Encounter  Procedures  . BASIC METABOLIC PANEL WITH GFR  . Pro b natriuretic peptide (BNP)[facility])  . Magnesium  . PCV MYOCARDIAL PERFUSION WITH LEXISCAN  . EKG 12-Lead  . PCV ECHOCARDIOGRAM COMPLETE   --Continue cardiac medications as reconciled in final medication list. --Return in about 2 weeks (around 09/13/2019) for Discussion of test results. or sooner if needed. --Continue follow-up with your primary care physician regarding the management of your other chronic comorbid conditions.  Patient's questions and concerns were addressed to his satisfaction. He voices understanding of the instructions provided during this encounter.   This note was created using a voice recognition software as a result there may be grammatical errors inadvertently enclosed that do not reflect the nature of this encounter. Every attempt is made to correct such errors.  Tessa Lerner, DO, [facility] Office: [phone]

## 2019-08-30 NOTE — Patient Instructions (Signed)
Please remember to bring in your medication bottles in at the next visit.   New Medications that were added or increase at today's visit:  Lipitor 20mg  po qhs HCTZ 12.5mg  po qAM Lisinopril 20mg  po qPM  Medications that were discontinued at today's visit: None  Office will call you to have the following tests scheduled:  Echo Stress Test   Please get labs done in 1 week at the nearest [facility].  Recommend follow up with your PCP as scheduled.

## 2019-08-31 ENCOUNTER — Ambulatory Visit: Payer: Self-pay | Admitting: Cardiology

## 2019-09-05 ENCOUNTER — Other Ambulatory Visit: Payer: Self-pay | Admitting: Cardiology

## 2019-09-05 ENCOUNTER — Other Ambulatory Visit: Payer: 59

## 2019-09-05 DIAGNOSIS — I10 Essential (primary) hypertension: Secondary | ICD-10-CM

## 2019-09-05 DIAGNOSIS — R0609 Other forms of dyspnea: Secondary | ICD-10-CM

## 2019-09-06 LAB — BASIC METABOLIC PANEL WITH GFR
BUN: 22 mg/dL (ref 7–25)
CO2: 24 mmol/L (ref 20–32)
Calcium: 9.8 mg/dL (ref 8.6–10.3)
Chloride: 98 mmol/L (ref 98–110)
Creat: 0.92 mg/dL (ref 0.70–1.33)
GFR, Est African American: 112 mL/min/{1.73_m2} (ref >=60)
GFR, Est Non African American: 97 mL/min/{1.73_m2} (ref >=60)
Glucose, Bld: 169 mg/dL — ABNORMAL HIGH (ref 65–139)
Potassium: 4.2 mmol/L (ref 3.5–5.3)
Sodium: 136 mmol/L (ref 135–146)

## 2019-09-07 ENCOUNTER — Other Ambulatory Visit: Payer: Self-pay

## 2019-09-07 ENCOUNTER — Ambulatory Visit: Payer: 59

## 2019-09-07 DIAGNOSIS — Z9289 Personal history of other medical treatment: Secondary | ICD-10-CM

## 2019-09-07 DIAGNOSIS — R9431 Abnormal electrocardiogram [ECG] [EKG]: Secondary | ICD-10-CM

## 2019-09-07 DIAGNOSIS — R0609 Other forms of dyspnea: Secondary | ICD-10-CM

## 2019-09-07 HISTORY — DX: Personal history of other medical treatment: Z92.89

## 2019-09-12 NOTE — Progress Notes (Signed)
Called pt and answer, left a vm

## 2019-09-13 ENCOUNTER — Encounter: Payer: Self-pay | Admitting: Medical

## 2019-09-13 NOTE — Progress Notes (Signed)
Patient is aware of echo results.

## 2019-09-16 ENCOUNTER — Ambulatory Visit: Payer: 59 | Admitting: Cardiology

## 2019-09-20 ENCOUNTER — Telehealth: Payer: Self-pay | Admitting: Medical

## 2019-09-20 ENCOUNTER — Encounter: Payer: Self-pay | Admitting: Medical

## 2019-09-20 NOTE — Telephone Encounter (Signed)
done

## 2019-09-20 NOTE — Telephone Encounter (Signed)
Please get his letter to him I printed today about shoe wear at work. Vincenza Hews

## 2019-09-21 ENCOUNTER — Other Ambulatory Visit: Payer: Self-pay | Admitting: Cardiology

## 2019-09-21 ENCOUNTER — Encounter: Payer: Self-pay | Admitting: Medical

## 2019-09-21 ENCOUNTER — Other Ambulatory Visit: Payer: Self-pay

## 2019-09-21 ENCOUNTER — Ambulatory Visit: Payer: 59

## 2019-09-21 ENCOUNTER — Telehealth: Payer: Self-pay

## 2019-09-23 NOTE — Telephone Encounter (Signed)
He is ok with it

## 2019-09-23 NOTE — Telephone Encounter (Signed)
Could you ask if he can go to [place].

## 2019-09-28 ENCOUNTER — Other Ambulatory Visit: Payer: Self-pay | Admitting: Cardiology

## 2019-09-28 ENCOUNTER — Ambulatory Visit: Payer: 59 | Admitting: Cardiology

## 2019-09-28 ENCOUNTER — Encounter: Payer: Self-pay | Admitting: Cardiology

## 2019-09-28 ENCOUNTER — Other Ambulatory Visit: Payer: Self-pay

## 2019-09-28 VITALS — BP 130/80 | HR 75 | Temp 98.0°F | Ht 68.0 in | Wt >= 6400 oz

## 2019-09-28 DIAGNOSIS — Z6841 Body Mass Index (BMI) 40.0 and over, adult: Secondary | ICD-10-CM

## 2019-09-28 DIAGNOSIS — R7303 Prediabetes: Secondary | ICD-10-CM

## 2019-09-28 DIAGNOSIS — I10 Essential (primary) hypertension: Secondary | ICD-10-CM

## 2019-09-28 DIAGNOSIS — R9431 Abnormal electrocardiogram [ECG] [EKG]: Secondary | ICD-10-CM

## 2019-09-28 DIAGNOSIS — E782 Mixed hyperlipidemia: Secondary | ICD-10-CM

## 2019-09-28 DIAGNOSIS — Z9989 Dependence on other enabling machines and devices: Secondary | ICD-10-CM

## 2019-09-28 DIAGNOSIS — R0609 Other forms of dyspnea: Secondary | ICD-10-CM

## 2019-09-28 DIAGNOSIS — G4733 Obstructive sleep apnea (adult) (pediatric): Secondary | ICD-10-CM

## 2019-09-28 DIAGNOSIS — Z8249 Family history of ischemic heart disease and other diseases of the circulatory system: Secondary | ICD-10-CM

## 2019-09-28 MED ORDER — HYDROCHLOROTHIAZIDE 25 MG PO TABS: 25.0000 mg | ORAL_TABLET | Freq: Every morning | ORAL | 3 refills | Status: DC

## 2019-09-28 MED ORDER — HYDROCHLOROTHIAZIDE 12.5 MG PO CAPS: 12.5000 mg | ORAL_CAPSULE | Freq: Every day | ORAL | 3 refills | Status: DC

## 2019-09-28 MED ORDER — PRAVASTATIN SODIUM 40 MG PO TABS: 40.0000 mg | ORAL_TABLET | Freq: Every evening | ORAL | 3 refills | Status: DC

## 2019-09-28 NOTE — Progress Notes (Signed)
Chief Complaint  Patient presents with  . Results    follow up  . Hypertension  . Shortness of Breath   REQUESTING PHYSICIAN:  Jac Canavan, PA-C [address] [place],  [place] [address]  HPI  Patrick Baxter is a 49 y.o. male his past medical history and cardiovascular risk factors include: Prediabetes, morbid obesity class III, hypertension, obstructive sleep apnea, and mixed hyperlipidemia.  Presents today to the office with a chief complaint of " hypertension, shortness of breath, follow-up on results."  Patient was initially referred to the office at the request of his primary care provider for evaluation of abnormal EKG.  The EKG received from the patient's primary care office dated August 04, 2019 illustrated normal sinus rhythm with a ventricular rate of approximately 80 bpm, incomplete right bundle branch block, and ST-T changes in the high lateral and lateral leads suggestive of possible lateral ischemia.  Symptomatically patient also has effort related dyspnea and associated chest tightness. The tightness is located substernally, nonradiating no improving or worsening factors, resolves within 30 seconds.  The intensity, frequency, and/or duration has not changed as per patient's recollection.  As result, patient was advised to undergo an ischemic work-up with the last office visit.  Echocardiogram results reviewed with the patient at today's office visit and noted below.  Nuclear stress test pending.   In the interim, patient states that his chest pain occurs seldomly but still has dyspnea on exertion.  The symptoms have not worsened in intensity frequency and duration.  Since last office visit he has not used sublingual nitroglycerin.   Hypertension: At the last office visit patient was instructed to invest in a blood pressure cuff and bring his blood pressure log and for review.  Patient states that his systolic blood pressures are around and diastolic blood  pressures are between 70-75 mmHg.  Pulse ranges between 70 and 80 bpm.  Patient has started to decrease salt intake in his diet.  At the last office visit we increased his lisinopril to 20 mg p.o. every afternoon.  And added hydrochlorothiazide.  Patient has worked on lifestyle modifications and has lost approximately 6 pounds since last office visit.  He is increase his physical activity on a daily basis by walking at home and using free weights.  He is congratulated on lifestyle modifications that he has made.   Hyperlipidemia: At last office visit patient was started on Lipitor 20 mg p.o. nightly as he has an elevated estimated 10-year risk of ASCVD.  Patient has not tolerated Lipitor secondary to leg cramps.  He has tried it twice 1 week apart.  Review of systems positive for: Dyspnea on exertion  as described above, elevated blood pressures (improving). Currently patient denies chest pain, lightheadedness, dizziness, palpitations, orthopnea, paroxysmal nocturnal dyspnea, lower extremity swelling, near syncope, syncopal events, hematochezia, hemoptysis, hematemesis, melanotic stools, no symptoms of amaurosis fugax, motor or sensory symptoms or dysphasia in the last 6 months.   Denies prior history of coronary artery disease, myocardial infarction, congestive heart failure, deep venous thrombosis, pulmonary embolism, stroke, transient ischemic attack.  FUNCTIONAL STATUS: Does not anticipate any fracture or exercise routine.  ALLERGIES: No Known Allergies   MEDICATION LIST PRIOR TO VISIT: Current Outpatient Medications on File Prior to Visit  Medication Sig Dispense Refill  . allopurinol (ZYLOPRIM) 300 MG tablet TAKE 1 TABLET(300 MG) BY MOUTH DAILY 90 tablet 3  . B Complex-C (PROBEC-T PO) Take by mouth.    . Calcium-Magnesium-Zinc 167-83-8 MG TABS  Take 1 tablet by mouth daily.    . cholecalciferol (VITAMIN D) 1000 units tablet Take 1 tablet (1,000 Units total) by mouth daily. 90 tablet 3  .  Coenzyme Q10 (CO Q 10 PO) Take by mouth.    . cyclobenzaprine (FLEXERIL) 10 MG tablet TAKE 1/2 TO 1 TABLET BY MOUTH EVERY NIGHT AT BEDTIME AS NEEDED 30 tablet 0  . FLUoxetine (PROZAC) 40 MG capsule Take 1 capsule (40 mg total) by mouth daily. 90 capsule 1  . Hyaluronic Acid 20-60 MG CAPS Take 120 mg by mouth.    . levothyroxine (SYNTHROID) 25 MCG tablet TAKE 1 TABLET(25 MCG) BY MOUTH DAILY BEFORE BREAKFAST 90 tablet 3  . lisinopril (ZESTRIL) 20 MG tablet Take 1 tablet (20 mg total) by mouth daily. 30 tablet 1  . metoprolol succinate (TOPROL-XL) 50 MG 24 hr tablet TAKE 1 TABLET BY MOUTH DAILY WITH OR IMMEDIATELY FOLLOWING A MEAL. 90 tablet 3  . montelukast (SINGULAIR) 10 MG tablet TAKE 1 TABLET(10 MG) BY MOUTH AT BEDTIME 90 tablet 3  . Multiple Vitamins-Minerals (MULTIVITAMIN WITH MINERALS) tablet Take 1 tablet by mouth daily. 90 tablet 3  . naproxen (NAPROSYN) 250 MG tablet Take by mouth 3 (three) times daily with meals.    . nitroGLYCERIN (NITROSTAT) 0.4 MG SL tablet Place 1 tablet (0.4 mg total) under the tongue every 5 (five) minutes as needed for chest pain. 30 tablet 1  . Omega-3 Fatty Acids (SALMON OIL-1000) 200 MG CAPS Take by mouth.     No current facility-administered medications on file prior to visit.    PAST MEDICAL HISTORY: Past Medical History:  Diagnosis Date  . Allergy   . Anxiety   . Back pain   . Bilateral chronic knee pain   . Chronic headaches   . Depression   . Dermatographism   . Dry skin   . Fatigue   . Frequent urination   . Gout   . Hx of echocardiogram 09/07/2019   normal LV function, EF 60-65%, no wall motion abnormality; Dr. Tessa Lernerolia Suraj Ramdass  . Hypertension   . Joint pain   . Knee pain   . Obesity   . Pre-diabetes   . Sleep apnea   . Stress   . Wears glasses     PAST SURGICAL HISTORY: Past Surgical History:  Procedure Laterality Date  . KNEE SURGERY  11/2006   right knee arthroscopy and meniscal repair, Dr. Conni ElliotLaw    FAMILY HISTORY: The patient  family history includes Alzheimer's disease in his mother; Arthritis in his father; Cancer in his maternal grandmother and mother; Diabetes in his paternal grandmother; Heart disease in his father and mother; Hyperlipidemia in his mother; Hypertension in his mother; Stroke in his mother.   SOCIAL HISTORY:  The patient  reports that he has quit smoking. He has never used smokeless tobacco. He reports current alcohol use of about 3.0 standard drinks of alcohol per week. He reports that he does not use drugs.  14 ORGAN REVIEW OF SYSTEMS: CONSTITUTIONAL: No fever or significant weight loss EYES: No recent significant visual change EARS, NOSE, MOUTH, THROAT: No recent significant change in hearing CARDIOVASCULAR: See discussion in subjective/HPI RESPIRATORY: See discussion in subjective/HPI GASTROINTESTINAL: No recent complaints of abdominal pain GENITOURINARY: No recent significant change in genitourinary status MUSCULOSKELETAL: No recent significant change in musculoskeletal status INTEGUMENTARY: No recent rash NEUROLOGIC: No recent significant change in motor function PSYCHIATRIC: No recent significant change in mood ENDOCRINOLOGIC: No recent significant change in endocrine status HEMATOLOGIC/LYMPHATIC:  No recent significant unexpected bruising ALLERGIC/IMMUNOLOGIC: No recent unexplained allergic reaction  PHYSICAL EXAM: Vitals with BMI 09/28/2019 09/28/2019 08/30/2019  Height - 5\' 8"  -  Weight - 455 lbs -  BMI - 69.2 -  Systolic 130 152  Diastolic 80 70 85  Pulse 75 93 80   Body mass index is 69.18 kg/m.  CONSTITUTIONAL: Well-developed and well-nourished. No acute distress.  SKIN: Skin is warm and dry. No rash noted. No cyanosis. No pallor. No jaundice HEAD: Normocephalic and atraumatic.  EYES: No scleral icterus MOUTH/THROAT: Moist oral membranes.  NECK: No JVD present. No thyromegaly noted. No carotid bruits  LYMPHATIC: No visible cervical adenopathy.  CHEST Normal  respiratory effort. No intercostal retractions  LUNGS: Clear to auscultation bilaterally.  No stridor. No wheezes. No rales.  CARDIOVASCULAR: Regular positive S1-S2, no murmurs rubs or gallops appreciated. ABDOMINAL: Morbidly obese, soft, nontender, nondistended, positive bowel sounds in all 4 quadrants.  No apparent ascites.  EXTREMITIES: Warm to touch, +1 pitting edema bilaterally, no peripheral edema  HEMATOLOGIC: No significant bruising NEUROLOGIC: Oriented to person, place, and time. Nonfocal. Normal muscle tone.  PSYCHIATRIC: Normal mood and affect. Normal behavior. Cooperative  CARDIAC DATABASE: EKG: August 04, 2019 which showed normal sinus rhythm with a ventricular rate of approximately 80 bpm, incomplete right bundle branch block, and ST-T changes in the high lateral and lateral leads suggestive of possible lateral ischemia.   08/30/2019 at 4:04 PM: Normal sinus rhythm with a ventricular rate of 87 bpm, incomplete right bundle branch block, ST-T changes in the high lateral lateral leads.  Overall sensitivity of the ECG is reduced secondary to baseline artifact.  Echocardiogram:  09/29/2017: LVEF 65-70%, normal wall motion, no regional wall motion abnormalities, moderately dilated left atrium, mild TR.  09/07/2019: LVEF 60-65%, LVEF 60-65%, normal diastolic function, moderate to severe concentric LVH, left atrial cavity is dilated, mild TR.  Stress Testing: Pending  Heart Catheterization: None  LABORATORY DATA: CBC Latest Ref Rng & Units 08/03/2019 09/15/2017 10/28/2016  WBC 3.4 - 10.8 x10E3/uL 9.3 6.9 7.7  Hemoglobin 13.0 - 17.7 g/dL 10/30/2016 85.8 85.0  Hematocrit 37.5 - 51.0 % 44.9 43.4 42.8  Platelets 150 - 450 x10E3/uL 191 162 170    CMP Latest Ref Rng & Units 09/06/2019 08/03/2019 09/15/2017  Glucose 65 - 139 mg/dL 11/15/2017) 412(I) 786(V)  BUN 7 - 25 mg/dL 22 19 18   Creatinine 0.70 - 1.33 mg/dL 672(C 9.47  Sodium 135 - 146 mmol/L 136 141 141  Potassium 3.5 - 5.3 mmol/L 4.2 4.5  4.7  Chloride 98 - 110 mmol/L 98 100 100  CO2 20 - 32 mmol/L 24 25 25   Calcium 8.6 - 10.3 mg/dL 9.8 9.4 8.9  Total Protein 6.0 - 8.5 g/dL - 7.6 7.1  Total Bilirubin 0.0 - 1.2 mg/dL - 0.5 0.3  Alkaline Phos 39 - 117 IU/L - 91 91  AST 0 - 40 IU/L - 36 20  ALT 0 - 44 IU/L - 43 22    Lipid Panel     Component Value Date/Time   CHOL 189 08/03/2019 1500   TRIG 162 (H) 08/03/2019 1500   HDL 37 (L) 08/03/2019 1500   CHOLHDL 5.1 (H) 08/03/2019 1500   CHOLHDL 4.9 10/28/2016 0917   VLDL 26 10/28/2016 0917   LDLCALC 123 (H) 08/03/2019 1500   LABVLDL 29 08/03/2019 1500    Lab Results  Component Value Date   HGBA1C 6.2 (H) 08/03/2019   HGBA1C 5.7 (H) 06/16/2018   HGBA1C  5.9 (H) 09/15/2017   No components found for: NTPROBNP Lab Results  Component Value Date   TSH 3.170 08/03/2019   TSH 2.600 06/16/2018   TSH 2.620 11/17/2017    FINAL MEDICATION LIST END OF ENCOUNTER: Meds ordered this encounter  Medications  . DISCONTD: hydrochlorothiazide (MICROZIDE) 12.5 MG capsule    Sig: Take 1 capsule (12.5 mg total) by mouth daily.    Dispense:  90 capsule    Refill:  3  . hydrochlorothiazide (HYDRODIURIL) 25 MG tablet    Sig: Take 1 tablet (25 mg total) by mouth in the morning.    Dispense:  90 tablet    Refill:  3  . pravastatin (PRAVACHOL) 40 MG tablet    Sig: Take 1 tablet (40 mg total) by mouth every evening.    Dispense:  90 tablet    Refill:  3    Medications Discontinued During This Encounter  Medication Reason  . atorvastatin (LIPITOR) 20 MG tablet Discontinued by provider  . hydrochlorothiazide (MICROZIDE) 12.5 MG capsule   . hydrochlorothiazide (MICROZIDE) 12.5 MG capsule Entry Error     Current Outpatient Medications:  .  allopurinol (ZYLOPRIM) 300 MG tablet, TAKE 1 TABLET(300 MG) BY MOUTH DAILY, Disp: 90 tablet, Rfl: 3 .  B Complex-C (PROBEC-T PO), Take by mouth., Disp: , Rfl:  .  Calcium-Magnesium-Zinc 167-83-8 MG TABS, Take 1 tablet by mouth daily., Disp: , Rfl:    .  cholecalciferol (VITAMIN D) 1000 units tablet, Take 1 tablet (1,000 Units total) by mouth daily., Disp: 90 tablet, Rfl: 3 .  Coenzyme Q10 (CO Q 10 PO), Take by mouth., Disp: , Rfl:  .  cyclobenzaprine (FLEXERIL) 10 MG tablet, TAKE 1/2 TO 1 TABLET BY MOUTH EVERY NIGHT AT BEDTIME AS NEEDED, Disp: 30 tablet, Rfl: 0 .  FLUoxetine (PROZAC) 40 MG capsule, Take 1 capsule (40 mg total) by mouth daily., Disp: 90 capsule, Rfl: 1 .  Hyaluronic Acid 20-60 MG CAPS, Take 120 mg by mouth., Disp: , Rfl:  .  levothyroxine (SYNTHROID) 25 MCG tablet, TAKE 1 TABLET(25 MCG) BY MOUTH DAILY BEFORE BREAKFAST, Disp: 90 tablet, Rfl: 3 .  lisinopril (ZESTRIL) 20 MG tablet, Take 1 tablet (20 mg total) by mouth daily., Disp: 30 tablet, Rfl: 1 .  metoprolol succinate (TOPROL-XL) 50 MG 24 hr tablet, TAKE 1 TABLET BY MOUTH DAILY WITH OR IMMEDIATELY FOLLOWING A MEAL., Disp: 90 tablet, Rfl: 3 .  montelukast (SINGULAIR) 10 MG tablet, TAKE 1 TABLET(10 MG) BY MOUTH AT BEDTIME, Disp: 90 tablet, Rfl: 3 .  Multiple Vitamins-Minerals (MULTIVITAMIN WITH MINERALS) tablet, Take 1 tablet by mouth daily., Disp: 90 tablet, Rfl: 3 .  naproxen (NAPROSYN) 250 MG tablet, Take by mouth 3 (three) times daily with meals., Disp: , Rfl:  .  nitroGLYCERIN (NITROSTAT) 0.4 MG SL tablet, Place 1 tablet (0.4 mg total) under the tongue every 5 (five) minutes as needed for chest pain., Disp: 30 tablet, Rfl: 1 .  Omega-3 Fatty Acids (SALMON OIL-1000) 200 MG CAPS, Take by mouth., Disp: , Rfl:  .  hydrochlorothiazide (HYDRODIURIL) 25 MG tablet, Take 1 tablet (25 mg total) by mouth in the morning., Disp: 90 tablet, Rfl: 3 .  pravastatin (PRAVACHOL) 40 MG tablet, Take 1 tablet (40 mg total) by mouth every evening., Disp: 90 tablet, Rfl: 3  IMPRESSION:    ICD-10-CM   1. Dyspnea on exertion  R06.00 hydrochlorothiazide (HYDRODIURIL) 25 MG tablet    DISCONTINUED: hydrochlorothiazide (MICROZIDE) 12.5 MG capsule  2. Essential hypertension  I10 hydrochlorothiazide  (  HYDRODIURIL) 25 MG tablet    Basic metabolic panel    Magnesium    DISCONTINUED: hydrochlorothiazide (MICROZIDE) 12.5 MG capsule  3. OSA on CPAP  G47.33    Z99.89   4. Family history of heart disease  Z82.49   5. Pre-diabetes  R73.03   6. Class 3 severe obesity due to excess calories with serious comorbidity and body mass index (BMI) greater than or equal to 70 in adult (HCC)  E66.01 pravastatin (PRAVACHOL) 40 MG tablet   Z68.45   7. Mixed hyperlipidemia  E78.2   8. Abnormal EKG  R94.31      RECOMMENDATIONS: Abnormal EKG:  Prior ECG today's ECG both were reviewed and interpreted findings noted above.  Echocardiogram results reviewed with the patient in great detail noted above for further reference.  Nuclear stress test currently pending. Patient has not required sublingual nitroglycerin tablets since last time I saw him in the office.  He still has them to be used as needed basis if needed.    Dyspnea on exertion: See above  Benign essential hypertension: Improving  We will increase hydrochlorothiazide to 25 mg p.o. every morning.  We will repeat blood work 1 week after up to reevaluate kidney function and electrolytes.   Hyperlipidemia:  Patient estimated 10-year risk of ASCVD is 9.2% after detailed discussion the shared decision was to initiate statin therapy.  Patient started Lipitor 20 mg p.o. nightly but was having muscle cramps.  He stopped taking it for 1 week and had a retrial a week later.  He continues to have muscle cramps and therefore has stopped taking Lipitor 20 mg p.o. nightly   We will start pravastatin 40 mg p.o. nightly.    Most recent AST and ALT levels reviewed from January 2021.  Orders Placed This Encounter  Procedures  . Basic metabolic panel  . Magnesium   --Continue cardiac medications as reconciled in final medication list. --Return in about 1 month (around 10/29/2019) for Discussion of test results stress test results, BP, HLD. or sooner if  needed. --Continue follow-up with your primary care physician regarding the management of your other chronic comorbid conditions.  Patient's questions and concerns were addressed to his satisfaction. He voices understanding of the instructions provided during this encounter.   This note was created using a voice recognition software as a result there may be grammatical errors inadvertently enclosed that do not reflect the nature of this encounter. Every attempt is made to correct such errors.  Patrick Lerner, DO, [facility], [facility] Office: [phone]

## 2019-09-28 NOTE — Patient Instructions (Signed)
Please remember to bring in your medication bottles in at the next visit.   New Medications that were added at today's visit:  Hydrochlorothiazide 25 mg p.o. every morning. Pravastatin 40 mg p.o. every afternoon  Medications that were discontinued at today's visit: Hydrochlorothiazide 12.5 mg p.o. every morning Lipitor  Office will call you to have the following tests scheduled:  Nuclear stress test at [facility]  Please get labs done in about 1 week after starting hydrochlorothiazide the higher dose at the nearest [facility].  Recommend follow up with your PCP as scheduled.

## 2019-10-01 ENCOUNTER — Other Ambulatory Visit: Payer: Self-pay | Admitting: Medical

## 2019-10-27 ENCOUNTER — Other Ambulatory Visit: Payer: Self-pay | Admitting: Cardiology

## 2019-10-27 DIAGNOSIS — I10 Essential (primary) hypertension: Secondary | ICD-10-CM

## 2019-11-02 ENCOUNTER — Ambulatory Visit: Payer: 59 | Admitting: Cardiology

## 2019-11-22 ENCOUNTER — Ambulatory Visit: Payer: 59 | Admitting: Cardiology

## 2019-11-22 ENCOUNTER — Other Ambulatory Visit: Payer: Self-pay

## 2019-11-22 ENCOUNTER — Encounter: Payer: Self-pay | Admitting: Cardiology

## 2019-11-22 VITALS — BP 135/80 | HR 91 | Temp 97.5°F | Ht 68.0 in | Wt >= 6400 oz

## 2019-11-22 DIAGNOSIS — Z8249 Family history of ischemic heart disease and other diseases of the circulatory system: Secondary | ICD-10-CM

## 2019-11-22 DIAGNOSIS — Z9989 Dependence on other enabling machines and devices: Secondary | ICD-10-CM

## 2019-11-22 DIAGNOSIS — R7303 Prediabetes: Secondary | ICD-10-CM

## 2019-11-22 DIAGNOSIS — E782 Mixed hyperlipidemia: Secondary | ICD-10-CM

## 2019-11-22 DIAGNOSIS — I10 Essential (primary) hypertension: Secondary | ICD-10-CM

## 2019-11-22 DIAGNOSIS — E66813 Obesity, class 3: Secondary | ICD-10-CM

## 2019-11-22 DIAGNOSIS — R9431 Abnormal electrocardiogram [ECG] [EKG]: Secondary | ICD-10-CM

## 2019-11-22 DIAGNOSIS — R0609 Other forms of dyspnea: Secondary | ICD-10-CM

## 2019-11-22 DIAGNOSIS — G4733 Obstructive sleep apnea (adult) (pediatric): Secondary | ICD-10-CM

## 2019-11-22 DIAGNOSIS — Z6841 Body Mass Index (BMI) 40.0 and over, adult: Secondary | ICD-10-CM

## 2019-11-22 MED ORDER — LIVALO 2 MG PO TABS: 2.0000 mg | ORAL_TABLET | Freq: Every day | ORAL | 0 refills | Status: DC

## 2019-11-22 NOTE — Progress Notes (Signed)
Buelah Manis Date of Birth: 10/02/1970 MRN: [id number] Primary Care Provider:Tysinger, Camelia Eng, PA-C Primary Cardiologist: Rex Kras, DO (established care 08/2019)  Date: 11/22/19 Last Office Visit: 09/28/2019  Chief Complaint  Patient presents with  . Hyperlipidemia    follow up  . Shortness of Breath    pt stopped statin due to side affects   HPI  Patrick Baxter is a 49 y.o. male his past medical history and cardiovascular risk factors include: Prediabetes, morbid obesity class III, hypertension, obstructive sleep apnea, and mixed hyperlipidemia.  Presents today to the office with a chief complaint of " shortness of breath, follow-up on results."  Patient was initially referred to the office at the request of his primary care provider for evaluation of abnormal EKG.  The EKG received from the patient's primary care office dated August 04, 2019 illustrated normal sinus rhythm with a ventricular rate of approximately 80 bpm, incomplete right bundle branch block, and ST-T changes in the high lateral and lateral leads suggestive of possible lateral ischemia.  Patient was recommended to undergo an echocardiogram and stress test.    Findings of the echo noted below for further reference.  And since last visit patient has undergone a nuclear stress test at [facility].  I do not have the images to review independently; however, I did review the report and explained it to the patient and his questions and concerns were addressed to his satisfaction.    Since the nuclear stress test patient states his symptoms feeling palpitations usually in the early morning hours when he wakes up.  He is attributing his symptoms to elevated blood pressures in the morning.  No significant caffeine intake.  No herbal supplements or stimulant medications.  Socially drinks alcohol.  Does not use any illicit drugs.  Symptoms are usually resolved in couple minutes and are intermittent.  Patient's antihypertensive  medications were uptitrated during his last visits.  However he did not bring his blood pressure log and for review nor does he check it on a periodic basis.  His initial blood pressure at today's office visit was elevated but did improve after resting and rechecking it.  At the last office visit patient mentioned that he was having myalgias secondary to Lipitor and his medications were changed to pravastatin.  After taking pravastatin for a week he was noticing joint pain and myalgias.  He stopped taking the statin for 1 week and retried it and continued to have joint pain and therefore has stopped taking statin therapy.  Since last visit patient has work on lifestyle modifications and has lost 6 pounds for which he is congratulated for at today's visit.  Denies prior history of coronary artery disease, myocardial infarction, congestive heart failure, deep venous thrombosis, pulmonary embolism, stroke, transient ischemic attack.  FUNCTIONAL STATUS: Does not anticipate any fracture or exercise routine.  ALLERGIES: No Known Allergies   MEDICATION LIST PRIOR TO VISIT: Current Outpatient Medications on File Prior to Visit  Medication Sig Dispense Refill  . allopurinol (ZYLOPRIM) 300 MG tablet TAKE 1 TABLET(300 MG) BY MOUTH DAILY 90 tablet 3  . B Complex-C (PROBEC-T PO) Take by mouth.    . Calcium-Magnesium-Zinc 167-83-8 MG TABS Take 1 tablet by mouth daily.    . cholecalciferol (VITAMIN D) 1000 units tablet Take 1 tablet (1,000 Units total) by mouth daily. 90 tablet 3  . Coenzyme Q10 (CO Q 10 PO) Take by mouth.    . cyclobenzaprine (FLEXERIL) 10 MG tablet TAKE 1/2 TO 1 TABLET  BY MOUTH EVERY NIGHT AT BEDTIME AS NEEDED 30 tablet 0  . FLUoxetine (PROZAC) 40 MG capsule Take 1 capsule (40 mg total) by mouth daily. 90 capsule 1  . hydrochlorothiazide (HYDRODIURIL) 25 MG tablet Take 1 tablet (25 mg total) by mouth in the morning. 90 tablet 3  . levothyroxine (SYNTHROID) 25 MCG tablet TAKE 1 TABLET(25  MCG) BY MOUTH DAILY BEFORE BREAKFAST 90 tablet 3  . lisinopril (ZESTRIL) 20 MG tablet TAKE 1 TABLET(20 MG) BY MOUTH DAILY 30 tablet 1  . metoprolol succinate (TOPROL-XL) 50 MG 24 hr tablet TAKE 1 TABLET BY MOUTH DAILY WITH OR IMMEDIATELY FOLLOWING A MEAL. 90 tablet 3  . montelukast (SINGULAIR) 10 MG tablet TAKE 1 TABLET(10 MG) BY MOUTH AT BEDTIME 90 tablet 3  . Multiple Vitamins-Minerals (MULTIVITAMIN WITH MINERALS) tablet Take 1 tablet by mouth daily. 90 tablet 3  . naproxen (NAPROSYN) 250 MG tablet Take by mouth 3 (three) times daily with meals.    . nitroGLYCERIN (NITROSTAT) 0.4 MG SL tablet Place 1 tablet (0.4 mg total) under the tongue every 5 (five) minutes as needed for chest pain. 30 tablet 1  . Omega-3 Fatty Acids (SALMON OIL-1000) 200 MG CAPS Take by mouth.    . Hyaluronic Acid 20-60 MG CAPS Take 120 mg by mouth.     No current facility-administered medications on file prior to visit.    PAST MEDICAL HISTORY: Past Medical History:  Diagnosis Date  . Allergy   . Anxiety   . Back pain   . Bilateral chronic knee pain   . Chronic headaches   . Depression   . Dermatographism   . Dry skin   . Fatigue   . Frequent urination   . Gout   . Hx of echocardiogram 09/07/2019   normal LV function, EF 60-65%, no wall motion abnormality; Dr. Tessa Lerner  . Hypertension   . Joint pain   . Knee pain   . Obesity   . Pre-diabetes   . Sleep apnea   . Stress   . Wears glasses     PAST SURGICAL HISTORY: Past Surgical History:  Procedure Laterality Date  . KNEE SURGERY  11/2006   right knee arthroscopy and meniscal repair, Dr. Conni Elliot    FAMILY HISTORY: The patient family history includes Alzheimer's disease in his mother; Arthritis in his father; Cancer in his maternal grandmother and mother; Diabetes in his paternal grandmother; Heart disease in his father and mother; Hyperlipidemia in his mother; Hypertension in his mother; Stroke in his mother.   SOCIAL HISTORY:  The patient   reports that he has quit smoking. He has never used smokeless tobacco. He reports current alcohol use of about 3.0 standard drinks of alcohol per week. He reports that he does not use drugs.  Review of Systems  Constitution: Positive for malaise/fatigue. Negative for chills and fever.  HENT: Negative for hoarse voice and nosebleeds.   Eyes: Negative for discharge, double vision and pain.  Cardiovascular: Positive for palpitations. Negative for chest pain, claudication, dyspnea on exertion, leg swelling, near-syncope, orthopnea, paroxysmal nocturnal dyspnea and syncope.  Respiratory: Positive for shortness of breath. Negative for hemoptysis.   Musculoskeletal: Negative for muscle cramps and myalgias.  Gastrointestinal: Negative for abdominal pain, constipation, diarrhea, hematemesis, hematochezia, melena, nausea and vomiting.  Neurological: Positive for headaches. Negative for dizziness and light-headedness.   PHYSICAL EXAM: Vitals with BMI 11/22/2019 11/22/2019 09/28/2019  Height - 5\' 8"  -  Weight - 449 lbs -  BMI - 68.29 -  Systolic 135 174 [id number]  Diastolic 80 83 80  Pulse 91 96 75   Body mass index is 68.27 kg/m.  CONSTITUTIONAL: Well-developed and well-nourished. No acute distress.  SKIN: Skin is warm and dry. No rash noted. No cyanosis. No pallor. No jaundice HEAD: Normocephalic and atraumatic.  EYES: No scleral icterus MOUTH/THROAT: Moist oral membranes.  NECK: No JVD present. No thyromegaly noted. No carotid bruits  LYMPHATIC: No visible cervical adenopathy.  CHEST Normal respiratory effort. No intercostal retractions  LUNGS: Clear to auscultation bilaterally.  No stridor. No wheezes. No rales.  CARDIOVASCULAR: Regular positive S1-S2, no murmurs rubs or gallops appreciated. ABDOMINAL: Morbidly obese, soft, nontender, nondistended, positive bowel sounds in all 4 quadrants.  No apparent ascites.  EXTREMITIES: Warm to touch, +1 pitting edema bilaterally, no peripheral edema    HEMATOLOGIC: No significant bruising NEUROLOGIC: Oriented to person, place, and time. Nonfocal. Normal muscle tone.  PSYCHIATRIC: Normal mood and affect. Normal behavior. Cooperative  CARDIAC DATABASE: EKG: August 04, 2019 which showed normal sinus rhythm with a ventricular rate of approximately 80 bpm, incomplete right bundle branch block, and ST-T changes in the high lateral and lateral leads suggestive of possible lateral ischemia.   08/30/2019 at 4:04 PM: Normal sinus rhythm with a ventricular rate of 87 bpm, incomplete right bundle branch block, ST-T changes in the high lateral lateral leads.  Overall sensitivity of the ECG is reduced secondary to baseline artifact.  Echocardiogram:  09/29/2017: LVEF 65-70%, normal wall motion, no regional wall motion abnormalities, moderately dilated left atrium, mild TR.  09/07/2019: LVEF 60-65%, LVEF 60-65%, normal diastolic function, moderate to severe concentric LVH, left atrial cavity is dilated, mild TR.  Stress Testing: 11/11/2019 at [facility]: Small sized inferior fixed perfusion defect suggestive of small inferior scar.  Diaphragmatic attenuation which could decrease specificity of the study.  Small sized mild intensity basal anterolateral fixed defect most likely secondary to chest wall attenuation and less likely tiny scar.  No evidence of ischemia.  Prognostically this is a low risk scan.  Heart Catheterization: None  LABORATORY DATA: CBC Latest Ref Rng & Units 08/03/2019 09/15/2017 10/28/2016  WBC 3.4 - 10.8 x10E3/uL 9.3 6.9 7.7  Hemoglobin 13.0 - 17.7 g/dL 81.115.2 91.414.3 78.214.4  Hematocrit 37.5 - 51.0 % 44.9 43.4 42.8  Platelets 150 - 450 x10E3/uL 191 162 170    CMP Latest Ref Rng & Units 09/06/2019 08/03/2019 09/15/2017  Glucose 65 - 139 mg/dL 956(O169(H) 130(Q103(H) 657(Q105(H)  BUN 7 - 25 mg/dL 22 19 18   Creatinine 0.70 - 1.33 mg/dL 4.690.92 6.290.79 5.280.82  Sodium 135 - 146 mmol/L 136 141 141  Potassium 3.5 - 5.3 mmol/L 4.2 4.5 4.7  Chloride 98 - 110 mmol/L  98 100 100  CO2 20 - 32 mmol/L 24 25 25   Calcium 8.6 - 10.3 mg/dL 9.8 9.4 8.9  Total Protein 6.0 - 8.5 g/dL - 7.6 7.1  Total Bilirubin 0.0 - 1.2 mg/dL - 0.5 0.3  Alkaline Phos 39 - 117 IU/L - 91 91  AST 0 - 40 IU/L - 36 20  ALT 0 - 44 IU/L - 43 22    Lipid Panel     Component Value Date/Time   CHOL 189 08/03/2019 1500   TRIG 162 (H) 08/03/2019 1500   HDL 37 (L) 08/03/2019 1500   CHOLHDL 5.1 (H) 08/03/2019 1500   CHOLHDL 4.9 10/28/2016 0917   VLDL 26 10/28/2016 0917   LDLCALC 123 (H) 08/03/2019 1500   LABVLDL 29 08/03/2019 1500  Lab Results  Component Value Date   HGBA1C 6.2 (H) 08/03/2019   HGBA1C 5.7 (H) 06/16/2018   HGBA1C 5.9 (H) 09/15/2017   No components found for: NTPROBNP Lab Results  Component Value Date   TSH 3.170 08/03/2019   TSH 2.600 06/16/2018   TSH 2.620 11/17/2017    FINAL MEDICATION LIST END OF ENCOUNTER: Meds ordered this encounter  Medications  . Pitavastatin Calcium (LIVALO) 2 MG TABS    Sig: Take 1 tablet (2 mg total) by mouth at bedtime.    Dispense:  90 tablet    Refill:  0    Medications Discontinued During This Encounter  Medication Reason  . pravastatin (PRAVACHOL) 40 MG tablet Side effect (s)     Current Outpatient Medications:  .  allopurinol (ZYLOPRIM) 300 MG tablet, TAKE 1 TABLET(300 MG) BY MOUTH DAILY, Disp: 90 tablet, Rfl: 3 .  B Complex-C (PROBEC-T PO), Take by mouth., Disp: , Rfl:  .  Calcium-Magnesium-Zinc 167-83-8 MG TABS, Take 1 tablet by mouth daily., Disp: , Rfl:  .  cholecalciferol (VITAMIN D) 1000 units tablet, Take 1 tablet (1,000 Units total) by mouth daily., Disp: 90 tablet, Rfl: 3 .  Coenzyme Q10 (CO Q 10 PO), Take by mouth., Disp: , Rfl:  .  cyclobenzaprine (FLEXERIL) 10 MG tablet, TAKE 1/2 TO 1 TABLET BY MOUTH EVERY NIGHT AT BEDTIME AS NEEDED, Disp: 30 tablet, Rfl: 0 .  FLUoxetine (PROZAC) 40 MG capsule, Take 1 capsule (40 mg total) by mouth daily., Disp: 90 capsule, Rfl: 1 .  hydrochlorothiazide (HYDRODIURIL)  25 MG tablet, Take 1 tablet (25 mg total) by mouth in the morning., Disp: 90 tablet, Rfl: 3 .  levothyroxine (SYNTHROID) 25 MCG tablet, TAKE 1 TABLET(25 MCG) BY MOUTH DAILY BEFORE BREAKFAST, Disp: 90 tablet, Rfl: 3 .  lisinopril (ZESTRIL) 20 MG tablet, TAKE 1 TABLET(20 MG) BY MOUTH DAILY, Disp: 30 tablet, Rfl: 1 .  metoprolol succinate (TOPROL-XL) 50 MG 24 hr tablet, TAKE 1 TABLET BY MOUTH DAILY WITH OR IMMEDIATELY FOLLOWING A MEAL., Disp: 90 tablet, Rfl: 3 .  montelukast (SINGULAIR) 10 MG tablet, TAKE 1 TABLET(10 MG) BY MOUTH AT BEDTIME, Disp: 90 tablet, Rfl: 3 .  Multiple Vitamins-Minerals (MULTIVITAMIN WITH MINERALS) tablet, Take 1 tablet by mouth daily., Disp: 90 tablet, Rfl: 3 .  naproxen (NAPROSYN) 250 MG tablet, Take by mouth 3 (three) times daily with meals., Disp: , Rfl:  .  nitroGLYCERIN (NITROSTAT) 0.4 MG SL tablet, Place 1 tablet (0.4 mg total) under the tongue every 5 (five) minutes as needed for chest pain., Disp: 30 tablet, Rfl: 1 .  Omega-3 Fatty Acids (SALMON OIL-1000) 200 MG CAPS, Take by mouth., Disp: , Rfl:  .  Hyaluronic Acid 20-60 MG CAPS, Take 120 mg by mouth., Disp: , Rfl:  .  Pitavastatin Calcium (LIVALO) 2 MG TABS, Take 1 tablet (2 mg total) by mouth at bedtime., Disp: 90 tablet, Rfl: 0  IMPRESSION:    ICD-10-CM   1. Abnormal EKG  R94.31   2. Mixed hyperlipidemia  E78.2   3. Pre-diabetes  R73.03   4. Family history of heart disease  Z82.49   5. OSA on CPAP  G47.33    Z99.89   6. Dyspnea on exertion  R06.00   7. Essential hypertension  I10   8. Class 3 severe obesity due to excess calories with serious comorbidity and body mass index (BMI) of 60.0 to 69.9 in adult [facility])  E66.01    Z68.44      RECOMMENDATIONS:  Abnormal EKG:  Prior ECG today's ECG both were reviewed and interpreted findings noted above.  Echocardiogram results reviewed with the patient in great detail noted above for further reference.  Nuclear stress test currently pending.  Patient has not  required sublingual nitroglycerin tablets since last time I saw him in the office.  He still has them to be used as needed basis if needed.    Dyspnea on exertion: Chronic and stable.  Patient had undergone echo and nuclear stress test and findings noted above for further reference.  Patient has implemented dietary and lifestyle modification has lost 6 pounds since last visit.  He has not required sublingual nitroglycerin tablets.  Patient is encouraged to increase his physical activity to 30 minutes a day 5 days a week.  Given his symptoms of palpitation we discussed considering a Holter monitor.  However, patient would like to focus on getting his blood pressure better controlled and being reevaluated for CPAP settings.  Use is very appropriate.  Patient states that he has an appointment with sleep medicine coming up.  Benign essential hypertension:   Medications reconciled.  Patient is asked to keep a log of his blood pressures and if there is consistently greater than 140 mmHg he is asked to call the office for further medication titration.    Patient is also asked to be reevaluated for his CPAP settings as he is been having more elevated blood pressures and palpitations in the morning, dry mouth in the morning, and does not feel well rested.  Patient states that he sometimes feels like taking a nap at work.  Hyperlipidemia:  In the past given the elevated estimated 10-year risk of ASCVD patient was started on statin therapy.  He was started on Lipitor 20 mg p.o. nightly but was later discontinued secondary to muscle cramps.  Later was transitioned to pravastatin 40 mg p.o. nightly but was unable to tolerate it secondary to joint pain and muscle pain.  Patient did not do well with a retrial at a lower dose either.  We will try Livalo 2 mg p.o. nightly.  Medication profile discussed with the patient.    No orders of the defined types were placed in this encounter.  --Continue  cardiac medications as reconciled in final medication list. --Return in about 2 months (around 01/22/2020) for re-evaluation of symptoms.. or sooner if needed. --Continue follow-up with your primary care physician regarding the management of your other chronic comorbid conditions.  Patient's questions and concerns were addressed to his satisfaction. He voices understanding of the instructions provided during this encounter.   This note was created using a voice recognition software as a result there may be grammatical errors inadvertently enclosed that do not reflect the nature of this encounter. Every attempt is made to correct such errors.  Tessa Lerner, DO, [facility] Office: [phone]

## 2019-11-24 ENCOUNTER — Telehealth: Payer: Self-pay

## 2019-11-24 ENCOUNTER — Telehealth: Payer: Self-pay | Admitting: Medical

## 2019-11-24 NOTE — Telephone Encounter (Signed)
See email.  Please call [facility] and give verbal order or write order on script and get another provider to sign for [facility] to adjust settings for patient as they don't feel something is working right.  I also would like a compliance report.

## 2019-11-24 NOTE — Telephone Encounter (Signed)
Insurance has denied Livalo, is there an alternative?

## 2019-11-25 ENCOUNTER — Other Ambulatory Visit: Payer: Self-pay | Admitting: Medical

## 2019-11-25 NOTE — Telephone Encounter (Signed)
Can we do prior auth? 

## 2019-11-28 ENCOUNTER — Other Ambulatory Visit: Payer: Self-pay | Admitting: Cardiology

## 2019-11-28 ENCOUNTER — Telehealth: Payer: Self-pay | Admitting: Medical

## 2019-11-28 ENCOUNTER — Other Ambulatory Visit: Payer: Self-pay | Admitting: Medical

## 2019-11-28 MED ORDER — SIMVASTATIN 40 MG PO TABS
40.0000 mg | ORAL_TABLET | Freq: Every day | ORAL | 0 refills | Status: DC
Start: 2019-11-28 — End: 2019-12-20

## 2019-11-28 NOTE — Telephone Encounter (Signed)
Call [facility] to change pressure setting on CPAP to 5-15cm H20 pressure

## 2019-11-28 NOTE — Telephone Encounter (Signed)
Compliance report has been requested and script has been sent to [facility].

## 2019-11-28 NOTE — Telephone Encounter (Signed)
I called back and gave Tiffany at [facility] the pressure numbers you provided. 5-15cm H20 pressure.

## 2019-11-28 NOTE — Telephone Encounter (Signed)
Start simvastatin 40 mg p.o. nightly. Will sent to his pharmacy.

## 2019-12-06 ENCOUNTER — Telehealth: Payer: Self-pay

## 2019-12-06 NOTE — Telephone Encounter (Signed)
livalo was still denied please advise

## 2019-12-12 NOTE — Telephone Encounter (Signed)
Please call in zetia for now 10mg  po qday.  Can we do prior auth as he cannot tolerate lipitor or pravastatin.

## 2019-12-13 ENCOUNTER — Other Ambulatory Visit: Payer: Self-pay

## 2019-12-13 MED ORDER — EZETIMIBE 10 MG PO TABS: 10.0000 mg | ORAL_TABLET | Freq: Every day | ORAL | 3 refills | Status: DC

## 2019-12-13 NOTE — Telephone Encounter (Signed)
Do you know which other statins may be covered under his insurance plan?

## 2019-12-15 NOTE — Telephone Encounter (Signed)
Please try again and let them know Mr. [name] has tired lipitor and pravastatin has been intolerant to it.  We tried to get other low-intensity statin but are having hard time with approval. Based on his drug plan what are his options. Also can he do Nexliezet.

## 2019-12-20 NOTE — Telephone Encounter (Signed)
We can work on PA for [employer], thank you for the efforts and please keep the patient updated.

## 2020-01-02 ENCOUNTER — Other Ambulatory Visit: Payer: Self-pay | Admitting: Cardiology

## 2020-01-02 DIAGNOSIS — I10 Essential (primary) hypertension: Secondary | ICD-10-CM

## 2020-01-12 ENCOUNTER — Other Ambulatory Visit: Payer: Self-pay | Admitting: Medical

## 2020-01-13 NOTE — Telephone Encounter (Signed)
[facility] is requesting to fill pt cyclobenaprine. Please advise [facility]

## 2020-01-17 ENCOUNTER — Other Ambulatory Visit: Payer: Self-pay

## 2020-01-17 ENCOUNTER — Telehealth: Payer: Self-pay

## 2020-01-17 MED ORDER — CYCLOBENZAPRINE HCL 10 MG PO TABS: ORAL_TABLET | ORAL | 0 refills | Status: DC

## 2020-01-17 NOTE — Telephone Encounter (Signed)
Medication has been sent and patient has been sent a message via mychart to call and schedule his appointment for additional refills.

## 2020-01-17 NOTE — Telephone Encounter (Signed)
Pt. Requesting refill on his Cyclobenzaprine 10 mg pt. Last apt. Was 08/03/19 and has no future apt.

## 2020-01-17 NOTE — Telephone Encounter (Signed)
Refill the cyclobenzaprine and get in for 69-month med check follow-up

## 2020-01-24 ENCOUNTER — Other Ambulatory Visit: Payer: Self-pay

## 2020-01-24 ENCOUNTER — Ambulatory Visit: Payer: 59 | Admitting: Cardiology

## 2020-01-24 ENCOUNTER — Encounter: Payer: Self-pay | Admitting: Cardiology

## 2020-01-24 VITALS — BP 128/86 | HR 89 | Resp 14 | Ht 68.0 in | Wt >= 6400 oz

## 2020-01-24 DIAGNOSIS — E782 Mixed hyperlipidemia: Secondary | ICD-10-CM

## 2020-01-24 DIAGNOSIS — R0609 Other forms of dyspnea: Secondary | ICD-10-CM

## 2020-01-24 DIAGNOSIS — Z8249 Family history of ischemic heart disease and other diseases of the circulatory system: Secondary | ICD-10-CM

## 2020-01-24 DIAGNOSIS — E66813 Obesity, class 3: Secondary | ICD-10-CM

## 2020-01-24 DIAGNOSIS — G4733 Obstructive sleep apnea (adult) (pediatric): Secondary | ICD-10-CM

## 2020-01-24 DIAGNOSIS — R7303 Prediabetes: Secondary | ICD-10-CM

## 2020-01-24 DIAGNOSIS — I10 Essential (primary) hypertension: Secondary | ICD-10-CM

## 2020-01-24 MED ORDER — EZETIMIBE 10 MG PO TABS: 10.0000 mg | ORAL_TABLET | Freq: Every day | ORAL | 0 refills | Status: DC

## 2020-01-24 MED ORDER — NEXLETOL 180 MG PO TABS: 180.0000 mg | ORAL_TABLET | Freq: Every day | ORAL | 3 refills | Status: DC

## 2020-01-24 NOTE — Progress Notes (Signed)
Patrick Baxter Date of Birth: Jun 03, 1971 MRN: [id number] Primary Care Provider:Tysinger, Kermit Balo, PA-C Primary Cardiologist: Tessa Lerner, DO (established care 08/2019)  Date: 01/24/20 Last Office Visit: 11/22/2019  Chief Complaint  Patient presents with   Abnormal ECG    Re-evaluation os symptoms   Follow-up    2 month   HPI  Patrick Baxter is a 49 y.o. male his past medical history and cardiovascular risk factors include: Prediabetes, morbid obesity class III, hypertension, obstructive sleep apnea, and mixed hyperlipidemia.  Presents today to the office with a chief complaint of " reevaluation of hypertension and hyperlipidemia."  Patient was initially referred to the office at the request of his primary care provider for evaluation of abnormal EKG. thereafter patient underwent both echo and nuclear stress test findings noted below for further reference.  At the last office visit we discussed improving his modifiable cardiovascular risk factors.  Patient's blood pressure was not at goal and therefore he was recommended to increase his physical activity for 30 minutes a day 5 days a week and to compliant with pharmacological therapy.  Patient did not bring in a blood pressure log for review as his blood pressure cuff stopped working.    In regards to sleep apnea patient states that he was recently refitted for a new mask and his overall compliance with the CPAP machine has improved since last visit as well as his blood pressures.    In regards to hyperlipidemia was on Lipitor as well as pravastatin in the past but he is unable to tolerate it due to muscle cramps and joint pain.  Therefore he was recommended to start Nexlizet with the insurance company has not approved and therefore medication was never started.    Denies prior history of coronary artery disease, myocardial infarction, congestive heart failure, deep venous thrombosis, pulmonary embolism, stroke, transient ischemic  attack.  FUNCTIONAL STATUS: Does not anticipate any fracture or exercise routine.  ALLERGIES: Allergies  Allergen Reactions   Atorvastatin Other (See Comments)    myalgia   Pravastatin Sodium Other (See Comments)    myalgia     MEDICATION LIST PRIOR TO VISIT: Current Outpatient Medications on File Prior to Visit  Medication Sig Dispense Refill   allopurinol (ZYLOPRIM) 300 MG tablet TAKE 1 TABLET(300 MG) BY MOUTH DAILY 90 tablet 3   B Complex-C (PROBEC-T PO) Take by mouth.     Calcium-Magnesium-Zinc 167-83-8 MG TABS Take 1 tablet by mouth daily.     cholecalciferol (VITAMIN D) 1000 units tablet Take 1 tablet (1,000 Units total) by mouth daily. 90 tablet 3   Coenzyme Q10 (CO Q 10 PO) Take by mouth.     cyclobenzaprine (FLEXERIL) 10 MG tablet Take 1/2 to 1 tablet every night as needed at bedtime . 30 tablet 0   FLUoxetine (PROZAC) 40 MG capsule Take 1 capsule (40 mg total) by mouth daily. 90 capsule 1   hydrochlorothiazide (HYDRODIURIL) 25 MG tablet Take 1 tablet (25 mg total) by mouth in the morning. 90 tablet 3   levothyroxine (SYNTHROID) 25 MCG tablet TAKE 1 TABLET(25 MCG) BY MOUTH DAILY BEFORE BREAKFAST 90 tablet 3   lisinopril (ZESTRIL) 20 MG tablet TAKE 1 TABLET(20 MG) BY MOUTH DAILY 30 tablet 1   metoprolol succinate (TOPROL-XL) 50 MG 24 hr tablet TAKE 1 TABLET BY MOUTH DAILY WITH OR IMMEDIATELY FOLLOWING A MEAL. 90 tablet 3   montelukast (SINGULAIR) 10 MG tablet TAKE 1 TABLET(10 MG) BY MOUTH AT BEDTIME 90 tablet 3   Multiple  Vitamins-Minerals (MULTIVITAMIN WITH MINERALS) tablet Take 1 tablet by mouth daily. 90 tablet 3   naproxen (NAPROSYN) 250 MG tablet Take by mouth 3 (three) times daily with meals.     nitroGLYCERIN (NITROSTAT) 0.4 MG SL tablet Place 1 tablet (0.4 mg total) under the tongue every 5 (five) minutes as needed for chest pain. (Patient taking differently: Place 0.4 mg under the tongue every 5 (five) minutes as needed for chest pain (Patient has not  needed to take any yet.). ) 30 tablet 1   Omega-3 Fatty Acids (SALMON OIL-1000) 200 MG CAPS Take by mouth.     No current facility-administered medications on file prior to visit.    PAST MEDICAL HISTORY: Past Medical History:  Diagnosis Date   Allergy    Anxiety    Back pain    Bilateral chronic knee pain    Chronic headaches    Depression    Dermatographism    Dry skin    Fatigue    Frequent urination    Gout    Hx of echocardiogram 09/07/2019   normal LV function, EF 60-65%, no wall motion abnormality; Dr. Tessa Lerner   Hypertension    Joint pain    Knee pain    Obesity    Pre-diabetes    Sleep apnea    Stress    Wears glasses     PAST SURGICAL HISTORY: Past Surgical History:  Procedure Laterality Date   KNEE SURGERY  11/2006   right knee arthroscopy and meniscal repair, Dr. Conni Elliot    FAMILY HISTORY: The patient family history includes Alzheimer's disease in his mother; Arthritis in his father; Cancer in his maternal grandmother and mother; Diabetes in his paternal grandmother; Heart disease in his father and mother; Hyperlipidemia in his mother; Hypertension in his mother; Stroke in his mother.   SOCIAL HISTORY:  The patient  reports that he has quit smoking. His smoking use included cigarettes. He has never used smokeless tobacco. He reports current alcohol use of about 3.0 standard drinks of alcohol per week. He reports that he does not use drugs.  Review of Systems  Constitutional: Positive for malaise/fatigue. Negative for chills and fever.  HENT: Negative for hoarse voice and nosebleeds.   Eyes: Negative for discharge, double vision and pain.  Cardiovascular: Negative for chest pain, claudication, dyspnea on exertion, leg swelling, near-syncope, orthopnea, palpitations, paroxysmal nocturnal dyspnea and syncope.  Respiratory: Positive for shortness of breath. Negative for hemoptysis.   Musculoskeletal: Positive for arthritis and back pain.  Negative for muscle cramps and myalgias.  Gastrointestinal: Negative for abdominal pain, constipation, diarrhea, hematemesis, hematochezia, melena, nausea and vomiting.  Neurological: Negative for dizziness, headaches and light-headedness.   PHYSICAL EXAM: Vitals with BMI 01/24/2020 11/22/2019 11/22/2019  Height  -   Weight 452 lbs - 449 lbs  BMI 68.74 - 68.29  Systolic 128 135 161  Diastolic 86 80 83  Pulse 89 91 96   Body mass index is 68.73 kg/m.  CONSTITUTIONAL: Well-developed and well-nourished. No acute distress.  SKIN: Skin is warm and dry. No rash noted. No cyanosis. No pallor. No jaundice HEAD: Normocephalic and atraumatic.  EYES: No scleral icterus MOUTH/THROAT: Moist oral membranes.  NECK: No JVD present. No thyromegaly noted. No carotid bruits  LYMPHATIC: No visible cervical adenopathy.  CHEST Normal respiratory effort. No intercostal retractions  LUNGS: Clear to auscultation bilaterally.  No stridor. No wheezes. No rales.  CARDIOVASCULAR: Regular positive S1-S2, no murmurs rubs or gallops appreciated. ABDOMINAL: Morbidly  obese, soft, nontender, nondistended, positive bowel sounds in all 4 quadrants.  No apparent ascites.  EXTREMITIES: Warm to touch, +1 pitting edema bilaterally, no peripheral edema  HEMATOLOGIC: No significant bruising NEUROLOGIC: Oriented to person, place, and time. Nonfocal. Normal muscle tone.  PSYCHIATRIC: Normal mood and affect. Normal behavior. Cooperative  CARDIAC DATABASE: EKG: 08/30/2019 at 4:04 PM: Normal sinus rhythm with a ventricular rate of 87 bpm, incomplete right bundle branch block, ST-T changes in the high lateral lateral leads.  Overall sensitivity of the ECG is reduced secondary to baseline artifact.  Echocardiogram:  09/07/2019: LVEF 60-65%, LVEF 60-65%, normal diastolic function, moderate to severe concentric LVH, left atrial cavity is dilated, mild TR.  Stress Testing: 11/11/2019 at [facility]: Small sized inferior  fixed perfusion defect suggestive of small inferior scar.  Diaphragmatic attenuation which could decrease specificity of the study.  Small sized mild intensity basal anterolateral fixed defect most likely secondary to chest wall attenuation and less likely tiny scar.  No evidence of ischemia.  Prognostically this is a low risk scan.  Heart Catheterization: None  LABORATORY DATA: CBC Latest Ref Rng & Units 08/03/2019 09/15/2017 10/28/2016  WBC 3.4 - 10.8 x10E3/uL 9.3 6.9 7.7  Hemoglobin 13.0 - 17.7 g/dL 32.9 51.8 84.1  Hematocrit 37.5 - 51.0 % 44.9 43.4 42.8  Platelets 150 - 450 x10E3/uL 191 162 170    CMP Latest Ref Rng & Units 09/06/2019 08/03/2019 09/15/2017  Glucose 65 - 139 mg/dL 660(Y) 301(S) 010(X)  BUN 7 - 25 mg/dL 22 19 18   Creatinine 0.70 - 1.33 mg/dL 3.23 5.57  Sodium 135 - 146 mmol/L 136 141 141  Potassium 3.5 - 5.3 mmol/L 4.2 4.5 4.7  Chloride 98 - 110 mmol/L 98 100 100  CO2 20 - 32 mmol/L 24 25 25   Calcium 8.6 - 10.3 mg/dL 9.8 9.4 8.9  Total Protein 6.0 - 8.5 g/dL - 7.6 7.1  Total Bilirubin 0.0 - 1.2 mg/dL - 0.5 0.3  Alkaline Phos 39 - 117 IU/L - 91 91  AST 0 - 40 IU/L - 36 20  ALT 0 - 44 IU/L - 43 22    Lipid Panel     Component Value Date/Time   CHOL 189 08/03/2019 1500   TRIG 162 (H) 08/03/2019 1500   HDL 37 (L) 08/03/2019 1500   CHOLHDL 5.1 (H) 08/03/2019 1500   CHOLHDL 4.9 10/28/2016 0917   VLDL 26 10/28/2016 0917   LDLCALC 123 (H) 08/03/2019 1500   LABVLDL 29 08/03/2019 1500    Lab Results  Component Value Date   HGBA1C 6.2 (H) 08/03/2019   HGBA1C 5.7 (H) 06/16/2018   HGBA1C 5.9 (H) 09/15/2017   No components found for: NTPROBNP Lab Results  Component Value Date   TSH 3.170 08/03/2019   TSH 2.600 06/16/2018   TSH 2.620 11/17/2017    FINAL MEDICATION LIST END OF ENCOUNTER: Meds ordered this encounter  Medications   ezetimibe (ZETIA) 10 MG tablet    Sig: Take 1 tablet (10 mg total) by mouth daily.    Dispense:  90 tablet    Refill:  0     Medications Discontinued During This Encounter  Medication Reason   Hyaluronic Acid 20-60 MG CAPS Patient Preference   Bempedoic Acid-Ezetimibe (NEXLIZET) 180-10 MG TABS Cost of medication     Current Outpatient Medications:    allopurinol (ZYLOPRIM) 300 MG tablet, TAKE 1 TABLET(300 MG) BY MOUTH DAILY, Disp: 90 tablet, Rfl: 3   B Complex-C (PROBEC-T PO), Take by mouth.,  Disp: , Rfl:    Calcium-Magnesium-Zinc 167-83-8 MG TABS, Take 1 tablet by mouth daily., Disp: , Rfl:    cholecalciferol (VITAMIN D) 1000 units tablet, Take 1 tablet (1,000 Units total) by mouth daily., Disp: 90 tablet, Rfl: 3   Coenzyme Q10 (CO Q 10 PO), Take by mouth., Disp: , Rfl:    cyclobenzaprine (FLEXERIL) 10 MG tablet, Take 1/2 to 1 tablet every night as needed at bedtime ., Disp: 30 tablet, Rfl: 0   FLUoxetine (PROZAC) 40 MG capsule, Take 1 capsule (40 mg total) by mouth daily., Disp: 90 capsule, Rfl: 1   hydrochlorothiazide (HYDRODIURIL) 25 MG tablet, Take 1 tablet (25 mg total) by mouth in the morning., Disp: 90 tablet, Rfl: 3   levothyroxine (SYNTHROID) 25 MCG tablet, TAKE 1 TABLET(25 MCG) BY MOUTH DAILY BEFORE BREAKFAST, Disp: 90 tablet, Rfl: 3   lisinopril (ZESTRIL) 20 MG tablet, TAKE 1 TABLET(20 MG) BY MOUTH DAILY, Disp: 30 tablet, Rfl: 1   metoprolol succinate (TOPROL-XL) 50 MG 24 hr tablet, TAKE 1 TABLET BY MOUTH DAILY WITH OR IMMEDIATELY FOLLOWING A MEAL., Disp: 90 tablet, Rfl: 3   montelukast (SINGULAIR) 10 MG tablet, TAKE 1 TABLET(10 MG) BY MOUTH AT BEDTIME, Disp: 90 tablet, Rfl: 3   Multiple Vitamins-Minerals (MULTIVITAMIN WITH MINERALS) tablet, Take 1 tablet by mouth daily., Disp: 90 tablet, Rfl: 3   naproxen (NAPROSYN) 250 MG tablet, Take by mouth 3 (three) times daily with meals., Disp: , Rfl:    nitroGLYCERIN (NITROSTAT) 0.4 MG SL tablet, Place 1 tablet (0.4 mg total) under the tongue every 5 (five) minutes as needed for chest pain. (Patient taking differently: Place 0.4 mg under the  tongue every 5 (five) minutes as needed for chest pain (Patient has not needed to take any yet.). ), Disp: 30 tablet, Rfl: 1   Omega-3 Fatty Acids (SALMON OIL-1000) 200 MG CAPS, Take by mouth., Disp: , Rfl:    ezetimibe (ZETIA) 10 MG tablet, Take 1 tablet (10 mg total) by mouth daily., Disp: 90 tablet, Rfl: 0  IMPRESSION:    ICD-10-CM   1. Dyspnea on exertion  R06.00   2. Essential hypertension  I10   3. OSA on CPAP  G47.33    Z99.89   4. Family history of heart disease  Z82.49   5. Pre-diabetes  R73.03   6. Mixed hyperlipidemia  E78.2 ezetimibe (ZETIA) 10 MG tablet    Lipid Panel With LDL/HDL Ratio    Lipid Panel With LDL/HDL Ratio  7. Class 3 severe obesity due to excess calories with serious comorbidity and body mass index (BMI) of 60.0 to 69.9 in adult [facility])  E66.01    Z68.44      RECOMMENDATIONS: Dyspnea on exertion: Chronic and stable.  Patient had undergone echo and nuclear stress test and findings noted above for further reference.  Patient has implemented dietary and lifestyle modification is motivated to lose weight.  He has not required sublingual nitroglycerin tablets since last office visit.  Patient is encouraged to increase his physical activity to 30 minutes a day 5 days a week.  Benign essential hypertension:   Medications reconciled.  Do not have a home blood pressure log to review despite educating him on multiple office visits.  I have encouraged him to invest in a blood pressure cuff.  Patient is asked to keep a log of his blood pressures and if there is consistently greater than 140 mmHg he is asked to call the office for further medication titration.  Hyperlipidemia:  In the past given the elevated estimated 10-year risk of ASCVD patient was started on statin therapy.  He was started on Lipitor 20 mg p.o. nightly but was later discontinued secondary to muscle cramps.  Later was transitioned to pravastatin 40 mg p.o. nightly but was unable to  tolerate it secondary to joint pain and muscle pain.  Patient did not do well with a retrial at a lower dose either.  Livalo 2 mg p.o. nightly was not approved by his insurance company.    Nexlizet also has not been approved by his insurance company.    For now will initiate Zetia 10 mg p.o. daily and try to get an approval for Nexletol.  We will check lipids prior to the next office visit  Orders Placed This Encounter  Procedures   Lipid Panel With LDL/HDL Ratio   --Continue cardiac medications as reconciled in final medication list. --Return in about 5 months (around 06/25/2020) for re-evaluation of dyspnea, HLD.Marland Kitchen. or sooner if needed. --Continue follow-up with your primary care physician regarding the management of your other chronic comorbid conditions.  Patient's questions and concerns were addressed to his satisfaction. He voices understanding of the instructions provided during this encounter.   This note was created using a voice recognition software as a result there may be grammatical errors inadvertently enclosed that do not reflect the nature of this encounter. Every attempt is made to correct such errors.  Time spent: 30 minutes.  Tessa LernerSunit Mathias Bogacki, [place]DO, The Friendship Amb[facility] Surgery CenterFACC  Pager: [phone] Office: [phone]

## 2020-01-24 NOTE — Patient Instructions (Addendum)
Fasting lipids before the next visit (on or after 06/12/2020)

## 2020-01-27 ENCOUNTER — Other Ambulatory Visit: Payer: Self-pay

## 2020-01-27 MED ORDER — NEXLETOL 180 MG PO TABS: 180.0000 mg | ORAL_TABLET | Freq: Every day | ORAL | 3 refills | Status: DC

## 2020-02-29 ENCOUNTER — Other Ambulatory Visit: Payer: Self-pay | Admitting: Cardiology

## 2020-02-29 DIAGNOSIS — I10 Essential (primary) hypertension: Secondary | ICD-10-CM

## 2020-04-02 ENCOUNTER — Ambulatory Visit: Payer: 59 | Admitting: Medical

## 2020-04-02 ENCOUNTER — Telehealth (INDEPENDENT_AMBULATORY_CARE_PROVIDER_SITE_OTHER): Payer: 59 | Admitting: Medical

## 2020-04-02 ENCOUNTER — Other Ambulatory Visit: Payer: Self-pay

## 2020-04-02 ENCOUNTER — Encounter: Payer: Self-pay | Admitting: Medical

## 2020-04-02 VITALS — Ht 68.0 in | Wt >= 6400 oz

## 2020-04-02 DIAGNOSIS — Z209 Contact with and (suspected) exposure to unspecified communicable disease: Secondary | ICD-10-CM

## 2020-04-02 DIAGNOSIS — J029 Acute pharyngitis, unspecified: Secondary | ICD-10-CM

## 2020-04-02 MED ORDER — AMOXICILLIN 500 MG PO CAPS
500.0000 mg | ORAL_CAPSULE | Freq: Three times a day (TID) | ORAL | 0 refills | Status: DC
Start: 2020-04-02 — End: 2020-07-24

## 2020-04-02 NOTE — Progress Notes (Signed)
Subjective:     Patient ID: Patrick Baxter, male   DOB: 15-Feb-1971, 49 y.o.   MRN: [id number]  This visit type was conducted due to national recommendations for restrictions regarding the COVID-19 Pandemic (e.g. social distancing) in an effort to limit this patient's exposure and mitigate transmission in our community.  Due to their co-morbid illnesses, this patient is at least at moderate risk for complications without adequate follow up.  This format is felt to be most appropriate for this patient at this time.    Documentation for virtual audio and video telecommunications through [place] encounter:  The patient was located at home. The provider was located in the office. The patient did consent to this visit and is aware of possible charges through their insurance for this visit.  The other persons participating in this telemedicine service were none. Time spent on call was 20 minutes and in review of previous records 20 minutes total.  This virtual service is not related to other E/M service within previous 7 days.   HPI Chief Complaint  Patient presents with  . Diarrhea    over the weekdend   . Sore Throat    and felt swollen yesterday    Virtual consult for illness.  No sick contacts.   Got sick on vacation 1.5 weeks ago at the beach.  Started with tickle in throat, but a week ago had horrible sore throat, really bad body aches, low grade fever.   Did 2 recent covid tests that were both negative.    This past weekend continued to feel rough.  Any time he swallows having pain.   No fever last few days.  Having periods of chills and aches.    continues to feel weak.  Wonders if he has had strep instead of covid.  No vomiting.  Has had some slight diarrhea, but none last week.  Then had some loose stool in recent days.  No back or abdominal pain out of the ordinary.  No urinary c/o.   No loss of smell or taste.  Has used salt water gargles.  Using some tylenol.  No other aggravating  or relieving factors. No other complaint.  Past Medical History:  Diagnosis Date  . Allergy   . Anxiety   . Back pain   . Bilateral chronic knee pain   . Chronic headaches   . Depression   . Dermatographism   . Dry skin   . Fatigue   . Frequent urination   . Gout   . Hx of echocardiogram 09/07/2019   normal LV function, EF 60-65%, no wall motion abnormality; Dr. Tessa Lerner  . Hyperlipidemia   . Hypertension   . Joint pain   . Knee pain   . Obesity   . Pre-diabetes   . Sleep apnea   . Stress   . Wears glasses     Review of Systems As in subjective    Objective:   Physical Exam Due to coronavirus pandemic stay at home measures, patient visit was virtual and they were not examined in person.   Ht 5\' 8"  (1.727 m)   Wt (!) 450 lb (204.1 kg)   BMI 68.42 kg/m       Assessment:     Encounter Diagnosis  Name Primary?  . Pharyngitis, unspecified etiology Yes        Plan:     Discussed symptoms.  He had 2 recent negative covid swabs during the course of this illness. Mostly improved  now except throat.  Begin empirical amoxicillin, c/t supportive measures.  Call back if not improving in the next 72 hours.  He voiced understanding and agreement of plan  Patrick Baxter was seen today for diarrhea and sore throat.  Diagnoses and all orders for this visit:  Pharyngitis, unspecified etiology  Other orders -     amoxicillin (AMOXIL) 500 MG capsule; Take 1 capsule (500 mg total) by mouth 3 (three) times daily.  f/u prn

## 2020-04-03 ENCOUNTER — Other Ambulatory Visit: Payer: Self-pay | Admitting: Cardiology

## 2020-04-03 DIAGNOSIS — I10 Essential (primary) hypertension: Secondary | ICD-10-CM

## 2020-04-09 ENCOUNTER — Other Ambulatory Visit (INDEPENDENT_AMBULATORY_CARE_PROVIDER_SITE_OTHER): Payer: 59

## 2020-04-09 DIAGNOSIS — J029 Acute pharyngitis, unspecified: Secondary | ICD-10-CM

## 2020-04-09 DIAGNOSIS — Z209 Contact with and (suspected) exposure to unspecified communicable disease: Secondary | ICD-10-CM

## 2020-04-09 LAB — POCT MONO (EPSTEIN BARR VIRUS): Mono, POC: NEGATIVE

## 2020-04-09 LAB — POCT RAPID STREP A (OFFICE): Rapid Strep A Screen: NEGATIVE

## 2020-04-09 NOTE — Telephone Encounter (Signed)
shane told patient to come in for mono today but pt sent a message asking for strep and std panel as well? Please advise

## 2020-04-09 NOTE — Telephone Encounter (Signed)
Pt coming in this afternoon for mono,strep, blood work for STD and will bring a urine with him to run for STD. Pt coming at 2:30

## 2020-04-10 LAB — GC/CHLAMYDIA PROBE AMP
Chlamydia trachomatis, NAA: NEGATIVE
Neisseria Gonorrhoeae by PCR: NEGATIVE

## 2020-04-10 LAB — HIV ANTIBODY (ROUTINE TESTING W REFLEX): HIV Screen 4th Generation wRfx: NONREACTIVE

## 2020-04-10 LAB — RPR: RPR Ser Ql: NONREACTIVE

## 2020-05-03 ENCOUNTER — Other Ambulatory Visit: Payer: Self-pay | Admitting: Medical

## 2020-05-03 NOTE — Telephone Encounter (Signed)
Is this okay to refill? 

## 2020-06-26 ENCOUNTER — Ambulatory Visit: Payer: 59 | Admitting: Cardiology

## 2020-07-17 ENCOUNTER — Telehealth: Payer: Self-pay | Admitting: Medical

## 2020-07-17 NOTE — Telephone Encounter (Signed)
Pt said he slipped in the snow yesterday and hurt his back. He wanted to see if he can get a partial dose increase of flexeril. He has an ortho appt on Friday and I set him up with a virtual with you tomorrow if you needed to speak with him first.

## 2020-07-18 ENCOUNTER — Encounter: Payer: Self-pay | Admitting: Medical

## 2020-07-18 ENCOUNTER — Other Ambulatory Visit: Payer: Self-pay

## 2020-07-18 ENCOUNTER — Telehealth (INDEPENDENT_AMBULATORY_CARE_PROVIDER_SITE_OTHER): Payer: 59 | Admitting: Medical

## 2020-07-18 VITALS — Ht 68.0 in | Wt >= 6400 oz

## 2020-07-18 DIAGNOSIS — M25559 Pain in unspecified hip: Secondary | ICD-10-CM

## 2020-07-18 DIAGNOSIS — I10 Essential (primary) hypertension: Secondary | ICD-10-CM

## 2020-07-18 DIAGNOSIS — M545 Low back pain, unspecified: Secondary | ICD-10-CM | POA: Diagnosis not present

## 2020-07-18 DIAGNOSIS — G8929 Other chronic pain: Secondary | ICD-10-CM

## 2020-07-18 DIAGNOSIS — R7301 Impaired fasting glucose: Secondary | ICD-10-CM

## 2020-07-18 DIAGNOSIS — T148XXA Other injury of unspecified body region, initial encounter: Secondary | ICD-10-CM

## 2020-07-18 MED ORDER — CYCLOBENZAPRINE HCL 10 MG PO TABS: ORAL_TABLET | ORAL | 3 refills | Status: DC

## 2020-07-18 NOTE — Progress Notes (Signed)
Subjective:     Patient ID: Patrick Baxter, male   DOB: 1970/07/26, 50 y.o.   MRN: [id number]  This visit type was conducted due to national recommendations for restrictions regarding the COVID-19 Pandemic (e.g. social distancing) in an effort to limit this patient's exposure and mitigate transmission in our community.  Due to their co-morbid illnesses, this patient is at least at moderate risk for complications without adequate follow up.  This format is felt to be most appropriate for this patient at this time.    Documentation for virtual audio and video telecommunications through [place] encounter:  The patient was located at home. The provider was located in the office. The patient did consent to this visit and is aware of possible charges through their insurance for this visit.  The other persons participating in this telemedicine service were none. Time spent on call was 20 minutes and in review of previous records 20 minutes total.  This virtual service is not related to other E/M service within previous 7 days.   HPI Chief Complaint  Patient presents with  . Medication Management    Discuss increasing Flexeril due to hip pain.    Virtual consult today for medication concerns.   Went for covid test Monday due to potential exposure.  On the way walking over in the snow, slipped and twisted Monday 2 days ago on the snow.  Didn't fall.  Wonders about pulled muscle.  Has had ongoing back pain but now having pain in left hip and pain radiating down left leg.  Already had right hip pain.    Has seen orthopedics this past summer for ongoing pain.   They were considering MRI lumbar spine . Has new appt with orthopedics this Friday with Dr. Turner Daniels.   Been using some aleve, alternating ice and heat.  Is resting, out of work today.  He just recently had blood work through cardiology and sees them within the next 2 weeks for follow-up  Past Medical History:  Diagnosis Date  . Allergy    . Anxiety   . Back pain   . Bilateral chronic knee pain   . Chronic headaches   . Depression   . Dermatographism   . Dry skin   . Fatigue   . Frequent urination   . Gout   . Hx of echocardiogram 09/07/2019   normal LV function, EF 60-65%, no wall motion abnormality; Dr. Tessa Lerner  . Hyperlipidemia   . Hypertension   . Joint pain   . Knee pain   . Obesity   . Pre-diabetes   . Sleep apnea   . Stress   . Wears glasses    Current Outpatient Medications on File Prior to Visit  Medication Sig Dispense Refill  . allopurinol (ZYLOPRIM) 300 MG tablet TAKE 1 TABLET(300 MG) BY MOUTH DAILY 90 tablet 3  . B Complex-C (PROBEC-T PO) Take by mouth.    . Calcium-Magnesium-Zinc 167-83-8 MG TABS Take 1 tablet by mouth daily.    . cholecalciferol (VITAMIN D) 1000 units tablet Take 1 tablet (1,000 Units total) by mouth daily. 90 tablet 3  . FLUoxetine (PROZAC) 40 MG capsule TAKE 1 CAPSULE(40 MG) BY MOUTH DAILY 90 capsule 0  . levothyroxine (SYNTHROID) 25 MCG tablet TAKE 1 TABLET(25 MCG) BY MOUTH DAILY BEFORE BREAKFAST 90 tablet 3  . lisinopril (ZESTRIL) 20 MG tablet TAKE 1 TABLET(20 MG) BY MOUTH DAILY 30 tablet 6  . metoprolol succinate (TOPROL-XL) 50 MG 24 hr tablet TAKE 1 TABLET  BY MOUTH DAILY WITH OR IMMEDIATELY FOLLOWING A MEAL. 90 tablet 3  . montelukast (SINGULAIR) 10 MG tablet TAKE 1 TABLET(10 MG) BY MOUTH AT BEDTIME 90 tablet 3  . Multiple Vitamins-Minerals (MULTIVITAMIN WITH MINERALS) tablet Take 1 tablet by mouth daily. 90 tablet 3  . naproxen (NAPROSYN) 250 MG tablet Take by mouth 3 (three) times daily with meals.    Marland Kitchen amoxicillin (AMOXIL) 500 MG capsule Take 1 capsule (500 mg total) by mouth 3 (three) times daily. (Patient not taking: Reported on 07/18/2020) 30 capsule 0  . Bempedoic Acid (NEXLETOL) 180 MG TABS Take 180 mg by mouth daily. (Patient not taking: No sig reported) 30 tablet 3  . Coenzyme Q10 (CO Q 10 PO) Take by mouth.    . ezetimibe (ZETIA) 10 MG tablet Take 1 tablet (10  mg total) by mouth daily. (Patient not taking: Reported on 04/02/2020) 90 tablet 0  . hydrochlorothiazide (HYDRODIURIL) 25 MG tablet Take 1 tablet (25 mg total) by mouth in the morning. 90 tablet 3  . nitroGLYCERIN (NITROSTAT) 0.4 MG SL tablet Place 1 tablet (0.4 mg total) under the tongue every 5 (five) minutes as needed for chest pain. (Patient taking differently: Place 0.4 mg under the tongue every 5 (five) minutes as needed for chest pain (Patient has not needed to take any yet.). ) 30 tablet 1  . Omega-3 Fatty Acids (SALMON OIL-1000) 200 MG CAPS Take by mouth.     No current facility-administered medications on file prior to visit.    Review of Systems As in subjective    Objective:   Physical Exam Due to coronavirus pandemic stay at home measures, patient visit was virtual and they were not examined in person.   Ht 5\' 8"  (1.727 m)   Wt (!) 450 lb (204.1 kg)   BMI 68.42 kg/m   Gen: wd, wn, nad Seems to have mild pain Pain bending over and moving with the back and hips      Assessment:     Encounter Diagnoses  Name Primary?  . Muscle strain Yes  . Acute low back pain without sciatica, unspecified back pain laterality   . Hip pain        Plan:     Discussed limitations of virtual consult  He has acute muscle strain spasm injury from slipping on the snow a few days ago.  He is using Aleve with some relief.  I advised 3 to 4 days of rest, gentle stretching, no heavy lifting for the next week over 30 pounds.  Begin Flexeril as needed next few nights and can use periodically as needed for chronic back pain  Chronic back pain and radicular symptoms-she has follow-up with orthopedics later this week and likely MRI lumbar spine coming up soon  Hypertension-he will have cardiology forward a copy of his recent labs and he will see them soon for yearly follow-up  He will return here in February for physical  Zhi was seen today for medication management.  Diagnoses and  all orders for this visit:  Muscle strain  Acute low back pain without sciatica, unspecified back pain laterality  Hip pain  Other orders -     cyclobenzaprine (FLEXERIL) 10 MG tablet; Take 1/2 to 1 tablet every night as needed at bedtime .  Follow-up in February as planned

## 2020-07-18 NOTE — Progress Notes (Signed)
done

## 2020-07-18 NOTE — Telephone Encounter (Signed)
We can discuss at visit

## 2020-07-19 LAB — LIPID PANEL WITH LDL/HDL RATIO
Cholesterol, Total: 193 mg/dL (ref 100–199)
HDL: 36 mg/dL — ABNORMAL LOW (ref >39)
LDL Chol Calc (NIH): 125 mg/dL — ABNORMAL HIGH (ref 0–99)
LDL/HDL Ratio: 3.5 ratio (ref 0.0–3.6)
Triglycerides: 182 mg/dL — ABNORMAL HIGH (ref 0–149)
VLDL Cholesterol Cal: 32 mg/dL (ref 5–40)

## 2020-07-24 ENCOUNTER — Encounter: Payer: Self-pay | Admitting: Cardiology

## 2020-07-24 ENCOUNTER — Other Ambulatory Visit: Payer: Self-pay

## 2020-07-24 ENCOUNTER — Ambulatory Visit: Payer: 59 | Admitting: Cardiology

## 2020-07-24 VITALS — BP 140/83 | HR 90 | Resp 16 | Ht 68.0 in | Wt >= 6400 oz

## 2020-07-24 DIAGNOSIS — R0609 Other forms of dyspnea: Secondary | ICD-10-CM

## 2020-07-24 DIAGNOSIS — E781 Pure hyperglyceridemia: Secondary | ICD-10-CM

## 2020-07-24 DIAGNOSIS — Z9989 Dependence on other enabling machines and devices: Secondary | ICD-10-CM

## 2020-07-24 DIAGNOSIS — R0600 Dyspnea, unspecified: Secondary | ICD-10-CM

## 2020-07-24 DIAGNOSIS — E782 Mixed hyperlipidemia: Secondary | ICD-10-CM

## 2020-07-24 DIAGNOSIS — G4733 Obstructive sleep apnea (adult) (pediatric): Secondary | ICD-10-CM

## 2020-07-24 DIAGNOSIS — R7303 Prediabetes: Secondary | ICD-10-CM

## 2020-07-24 DIAGNOSIS — I10 Essential (primary) hypertension: Secondary | ICD-10-CM

## 2020-07-24 DIAGNOSIS — Z8249 Family history of ischemic heart disease and other diseases of the circulatory system: Secondary | ICD-10-CM

## 2020-07-24 MED ORDER — FENOFIBRATE 145 MG PO TABS: 145.0000 mg | ORAL_TABLET | Freq: Every day | ORAL | 0 refills | Status: DC

## 2020-07-24 MED ORDER — NEXLETOL 180 MG PO TABS
180.0000 mg | ORAL_TABLET | Freq: Every day | ORAL | 0 refills | Status: DC
Start: 2020-07-24 — End: 2020-11-06

## 2020-07-24 NOTE — Progress Notes (Signed)
Patrick BucklesNathan Baxter Date of Birth: 11-01-1970 MRN: [id number] Primary Care Provider:Tysinger, Kermit Baloavid S, PA-C Primary Cardiologist: Tessa LernerSunit Tannie Koskela, DO (established care 08/2019)  Date: 07/24/20 Last Office Visit: 01/24/2020  Chief Complaint  Patient presents with  . Dyspnea on exertion  . Follow-up   HPI  Patrick Bucklesathan Service is a 50 y.o. male his past medical history and cardiovascular risk factors include: Prediabetes, morbid obesity class III, hypertension, obstructive sleep apnea, and mixed hyperlipidemia.  Presents today to the office with a chief complaint of " shortness of breath."  Patient was initially referred to the office at the request of his primary care provider for evaluation of abnormal EKG. thereafter patient underwent both echo and nuclear stress test findings noted below for further reference.  At the last office visit we discussed improving his modifiable cardiovascular risk factors.  He was encouraged to walk 30 minutes a day 5 days a week with moderate intensity exercise to help facilitate weight loss and improve physical endurance.  Unfortunately, patient has not been doing so and has gained weight since her last office visit.  With regards to lipid management we decided to start Zetia as he was intolerant to other statin medications in the past.  Patient states that he took Zetia for a short period of time but discontinued it secondary to joint pain.  He has not required sublingual nitroglycerin tablets for either chest pain or dyspnea on exertion.  Patient states that he is more compliant with the CPAP machine since it was refitted.  Patient states that his shortness of breath is chronic and stable and most likely attributed to increasing weight gain due to decreased motivation.  FUNCTIONAL STATUS: Does not anticipate any fracture or exercise routine.  ALLERGIES: Allergies  Allergen Reactions  . Atorvastatin Other (See Comments)    myalgia  . Pravastatin Sodium Other (See  Comments)    myalgia   MEDICATION LIST PRIOR TO VISIT: Current Outpatient Medications on File Prior to Visit  Medication Sig Dispense Refill  . allopurinol (ZYLOPRIM) 300 MG tablet TAKE 1 TABLET(300 MG) BY MOUTH DAILY 90 tablet 3  . B Complex-C (PROBEC-T PO) Take by mouth.    . Calcium-Magnesium-Zinc 167-83-8 MG TABS Take 1 tablet by mouth daily.    . cholecalciferol (VITAMIN D) 1000 units tablet Take 1 tablet (1,000 Units total) by mouth daily. 90 tablet 3  . Coenzyme Q10 (CO Q 10 PO) Take by mouth.    . cyclobenzaprine (FLEXERIL) 10 MG tablet Take 1/2 to 1 tablet every night as needed at bedtime . 30 tablet 3  . FLUoxetine (PROZAC) 40 MG capsule TAKE 1 CAPSULE(40 MG) BY MOUTH DAILY 90 capsule 0  . hydrochlorothiazide (HYDRODIURIL) 25 MG tablet Take 1 tablet (25 mg total) by mouth in the morning. 90 tablet 3  . levothyroxine (SYNTHROID) 25 MCG tablet TAKE 1 TABLET(25 MCG) BY MOUTH DAILY BEFORE BREAKFAST 90 tablet 3  . lisinopril (ZESTRIL) 20 MG tablet TAKE 1 TABLET(20 MG) BY MOUTH DAILY 30 tablet 6  . metoprolol succinate (TOPROL-XL) 50 MG 24 hr tablet TAKE 1 TABLET BY MOUTH DAILY WITH OR IMMEDIATELY FOLLOWING A MEAL. 90 tablet 3  . montelukast (SINGULAIR) 10 MG tablet TAKE 1 TABLET(10 MG) BY MOUTH AT BEDTIME 90 tablet 3  . Multiple Vitamins-Minerals (MULTIVITAMIN WITH MINERALS) tablet Take 1 tablet by mouth daily. 90 tablet 3  . naproxen (NAPROSYN) 250 MG tablet Take by mouth 3 (three) times daily with meals.    . nitroGLYCERIN (NITROSTAT) 0.4 MG SL tablet  Place 1 tablet (0.4 mg total) under the tongue every 5 (five) minutes as needed for chest pain. (Patient taking differently: Place 0.4 mg under the tongue every 5 (five) minutes as needed for chest pain (Patient has not needed to take any yet.).) 30 tablet 1  . Omega-3 Fatty Acids (SALMON OIL-1000) 200 MG CAPS Take by mouth.    . meloxicam (MOBIC) 15 MG tablet Take 15 mg by mouth daily.     No current facility-administered medications on  file prior to visit.    PAST MEDICAL HISTORY: Past Medical History:  Diagnosis Date  . Allergy   . Anxiety   . Back pain   . Bilateral chronic knee pain   . Chronic headaches   . Depression   . Dermatographism   . Dry skin   . Fatigue   . Frequent urination   . Gout   . Hx of echocardiogram 09/07/2019   normal LV function, EF 60-65%, no wall motion abnormality; Dr. Tessa Lerner  . Hyperlipidemia   . Hypertension   . Joint pain   . Knee pain   . Obesity   . Pre-diabetes   . Sleep apnea   . Stress   . Wears glasses     PAST SURGICAL HISTORY: Past Surgical History:  Procedure Laterality Date  . KNEE SURGERY  11/2006   right knee arthroscopy and meniscal repair, Dr. Conni Elliot    FAMILY HISTORY: The patient family history includes Alzheimer's disease in his mother; Arthritis in his father; Cancer in his maternal grandmother and mother; Diabetes in his paternal grandmother; Heart disease in his father and mother; Hyperlipidemia in his mother; Hypertension in his mother; Stroke in his mother.   SOCIAL HISTORY:  The patient  reports that he has quit smoking. His smoking use included cigarettes. He has never used smokeless tobacco. He reports current alcohol use of about 3.0 standard drinks of alcohol per week. He reports that he does not use drugs.  Review of Systems  Constitutional: Positive for malaise/fatigue. Negative for chills and fever.  HENT: Negative for hoarse voice and nosebleeds.   Eyes: Negative for discharge, double vision and pain.  Cardiovascular: Negative for chest pain, claudication, dyspnea on exertion, leg swelling, near-syncope, orthopnea, palpitations, paroxysmal nocturnal dyspnea and syncope.  Respiratory: Positive for shortness of breath (chronic and fatigue). Negative for hemoptysis.   Musculoskeletal: Positive for arthritis and back pain. Negative for muscle cramps and myalgias.  Gastrointestinal: Negative for abdominal pain, constipation, diarrhea,  hematemesis, hematochezia, melena, nausea and vomiting.  Neurological: Negative for dizziness, headaches and light-headedness.   PHYSICAL EXAM: Vitals with BMI 07/24/2020 07/18/2020 04/02/2020  Height 5\' 8"  5\' 8"  5\' 8"   Weight 456 lbs 450 lbs 450 lbs  BMI 69.35 68.44 68.44  Systolic 140 - -  Diastolic 83 - -  Pulse 90 - -   Body mass index is 69.33 kg/m.  CONSTITUTIONAL: Well-developed and well-nourished. No acute distress.  SKIN: Skin is warm and dry. No rash noted. No cyanosis. No pallor. No jaundice HEAD: Normocephalic and atraumatic.  EYES: No scleral icterus MOUTH/THROAT: Moist oral membranes.  NECK: No JVD present. No thyromegaly noted. No carotid bruits  LYMPHATIC: No visible cervical adenopathy.  CHEST Normal respiratory effort. No intercostal retractions  LUNGS: Clear to auscultation bilaterally.  No stridor. No wheezes. No rales.  CARDIOVASCULAR: Regular positive S1-S2, no murmurs rubs or gallops appreciated. ABDOMINAL: Morbidly obese, soft, nontender, nondistended, positive bowel sounds in all 4 quadrants.  No apparent ascites.  EXTREMITIES:  Warm to touch, +1 pitting edema bilaterally, no peripheral edema  HEMATOLOGIC: No significant bruising NEUROLOGIC: Oriented to person, place, and time. Nonfocal. Normal muscle tone.  PSYCHIATRIC: Normal mood and affect. Normal behavior. Cooperative  CARDIAC DATABASE: EKG: 07/24/2020: Normal sinus rhythm, 84 bpm, incomplete right bundle branch block, without underlying injury pattern.  Echocardiogram:  09/07/2019: LVEF 60-65%, LVEF 60-65%, normal diastolic function, moderate to severe concentric LVH, left atrial cavity is dilated, mild TR.  Stress Testing: 11/11/2019 at [facility]: Small sized inferior fixed perfusion defect suggestive of small inferior scar.  Diaphragmatic attenuation which could decrease specificity of the study.  Small sized mild intensity basal anterolateral fixed defect most likely secondary to chest wall  attenuation and less likely tiny scar.  No evidence of ischemia.  Prognostically this is a low risk scan.  Heart Catheterization: None  LABORATORY DATA: CBC Latest Ref Rng & Units 08/03/2019 09/15/2017 10/28/2016  WBC 3.4 - 10.8 x10E3/uL 9.3 6.9 7.7  Hemoglobin 13.0 - 17.7 g/dL 46.5 68.1 27.5  Hematocrit 37.5 - 51.0 % 44.9 43.4 42.8  Platelets 150 - 450 x10E3/uL 191 162 170    CMP Latest Ref Rng & Units 09/06/2019 08/03/2019 09/15/2017  Glucose 65 - 139 mg/dL 170(Y) 174(B) 449(Q)  BUN 7 - 25 mg/dL 22 19 18   Creatinine 0.70 - 1.33 mg/dL 7.59 1.63  Sodium 135 - 146 mmol/L 136 141 141  Potassium 3.5 - 5.3 mmol/L 4.2 4.5 4.7  Chloride 98 - 110 mmol/L 98 100 100  CO2 20 - 32 mmol/L 24 25 25   Calcium 8.6 - 10.3 mg/dL 9.8 9.4 8.9  Total Protein 6.0 - 8.5 g/dL - 7.6 7.1  Total Bilirubin 0.0 - 1.2 mg/dL - 0.5 0.3  Alkaline Phos 39 - 117 IU/L - 91 91  AST 0 - 40 IU/L - 36 20  ALT 0 - 44 IU/L - 43 22    Lipid Panel     Component Value Date/Time   CHOL 193 07/18/2020 0954   TRIG 182 (H) 07/18/2020 0954   HDL 36 (L) 07/18/2020 0954   CHOLHDL 5.1 (H) 08/03/2019 1500   CHOLHDL 4.9 10/28/2016 0917   VLDL 26 10/28/2016 0917   LDLCALC 125 (H) 07/18/2020 0954   LABVLDL 32 07/18/2020 0954    Lab Results  Component Value Date   HGBA1C 6.2 (H) 08/03/2019   HGBA1C 5.7 (H) 06/16/2018   HGBA1C 5.9 (H) 09/15/2017   No components found for: NTPROBNP Lab Results  Component Value Date   TSH 3.170 08/03/2019   TSH 2.600 06/16/2018   TSH 2.620 11/17/2017    FINAL MEDICATION LIST END OF ENCOUNTER: Meds ordered this encounter  Medications  . Bempedoic Acid (NEXLETOL) 180 MG TABS    Sig: Take 180 mg by mouth daily.    Dispense:  90 tablet    Refill:  0  . fenofibrate (TRICOR) 145 MG tablet    Sig: Take 1 tablet (145 mg total) by mouth daily.    Dispense:  90 tablet    Refill:  0    Medications Discontinued During This Encounter  Medication Reason  . amoxicillin (AMOXIL) 500 MG  capsule Patient Preference  . Bempedoic Acid (NEXLETOL) 180 MG TABS Patient Preference  . ezetimibe (ZETIA) 10 MG tablet Patient Preference     Current Outpatient Medications:  .  allopurinol (ZYLOPRIM) 300 MG tablet, TAKE 1 TABLET(300 MG) BY MOUTH DAILY, Disp: 90 tablet, Rfl: 3 .  B Complex-C (PROBEC-T PO), Take by mouth., Disp: ,  Rfl:  .  Bempedoic Acid (NEXLETOL) 180 MG TABS, Take 180 mg by mouth daily., Disp: 90 tablet, Rfl: 0 .  Calcium-Magnesium-Zinc 167-83-8 MG TABS, Take 1 tablet by mouth daily., Disp: , Rfl:  .  cholecalciferol (VITAMIN D) 1000 units tablet, Take 1 tablet (1,000 Units total) by mouth daily., Disp: 90 tablet, Rfl: 3 .  Coenzyme Q10 (CO Q 10 PO), Take by mouth., Disp: , Rfl:  .  cyclobenzaprine (FLEXERIL) 10 MG tablet, Take 1/2 to 1 tablet every night as needed at bedtime ., Disp: 30 tablet, Rfl: 3 .  fenofibrate (TRICOR) 145 MG tablet, Take 1 tablet (145 mg total) by mouth daily., Disp: 90 tablet, Rfl: 0 .  FLUoxetine (PROZAC) 40 MG capsule, TAKE 1 CAPSULE(40 MG) BY MOUTH DAILY, Disp: 90 capsule, Rfl: 0 .  hydrochlorothiazide (HYDRODIURIL) 25 MG tablet, Take 1 tablet (25 mg total) by mouth in the morning., Disp: 90 tablet, Rfl: 3 .  levothyroxine (SYNTHROID) 25 MCG tablet, TAKE 1 TABLET(25 MCG) BY MOUTH DAILY BEFORE BREAKFAST, Disp: 90 tablet, Rfl: 3 .  lisinopril (ZESTRIL) 20 MG tablet, TAKE 1 TABLET(20 MG) BY MOUTH DAILY, Disp: 30 tablet, Rfl: 6 .  metoprolol succinate (TOPROL-XL) 50 MG 24 hr tablet, TAKE 1 TABLET BY MOUTH DAILY WITH OR IMMEDIATELY FOLLOWING A MEAL., Disp: 90 tablet, Rfl: 3 .  montelukast (SINGULAIR) 10 MG tablet, TAKE 1 TABLET(10 MG) BY MOUTH AT BEDTIME, Disp: 90 tablet, Rfl: 3 .  Multiple Vitamins-Minerals (MULTIVITAMIN WITH MINERALS) tablet, Take 1 tablet by mouth daily., Disp: 90 tablet, Rfl: 3 .  naproxen (NAPROSYN) 250 MG tablet, Take by mouth 3 (three) times daily with meals., Disp: , Rfl:  .  nitroGLYCERIN (NITROSTAT) 0.4 MG SL tablet, Place 1  tablet (0.4 mg total) under the tongue every 5 (five) minutes as needed for chest pain. (Patient taking differently: Place 0.4 mg under the tongue every 5 (five) minutes as needed for chest pain (Patient has not needed to take any yet.).), Disp: 30 tablet, Rfl: 1 .  Omega-3 Fatty Acids (SALMON OIL-1000) 200 MG CAPS, Take by mouth., Disp: , Rfl:  .  meloxicam (MOBIC) 15 MG tablet, Take 15 mg by mouth daily., Disp: , Rfl:   IMPRESSION:    ICD-10-CM   1. Mixed hyperlipidemia  E78.2 Bempedoic Acid (NEXLETOL) 180 MG TABS    fenofibrate (TRICOR) 145 MG tablet    Lipid Panel With LDL/HDL Ratio    LDL cholesterol, direct    Comp. Metabolic Panel (12)    Comp. Metabolic Panel (12)    LDL cholesterol, direct    Lipid Panel With LDL/HDL Ratio  2. Dyspnea on exertion  R06.00 EKG 12-Lead  3. Essential hypertension  I10   4. OSA on CPAP  G47.33    Z99.89   5. Family history of heart disease  Z82.49   6. Pre-diabetes  R73.03   7. Hypertriglyceridemia  E78.1 fenofibrate (TRICOR) 145 MG tablet    Lipid Panel With LDL/HDL Ratio    LDL cholesterol, direct    Comp. Metabolic Panel (12)    Comp. Metabolic Panel (12)    LDL cholesterol, direct    Lipid Panel With LDL/HDL Ratio     RECOMMENDATIONS: Dyspnea on exertion: Chronic and stable.  Has undergone echo and nuclear stress test results were reviewed.  No additional testing needed at this time.    Educated on the importance of improving his modifiable cardiovascular risk factors.    Patient has an upcoming appointment with his PCP and  recommended possibly seeing weight loss management for additional guidance.    Benign essential hypertension:   Medications reconciled.  Office blood pressures within acceptable range but not at goal.  Currently managed by primary care provider.  Low-salt diet recommended.  Hyperlipidemia:  In the past given the elevated estimated 10-year risk of ASCVD patient was started on statin therapy.  Lipitor was  later discontinued secondary to muscle cramps.  Pravastatin 40 mg p.o. nightly but was unable to tolerate it secondary to joint pain and muscle pain.  Patient did not do well with a retrial at a lower dose either. Livalo 2 mg p.o. nightly was not approved by his insurance company.    Nexlizet also has not been approved by his insurance company.    Last visit started Zetia 10 mg p.o. daily, patient discontinued it secondary to joint pain.    Fasting lipid profile reviewed with him at today's office visit.  His triglyceride continues to trend up and non-HDL is relatively stable at 125 mg/dL.    Will initiate fenofibrate to help improve his triglycerides and Nexletol for lipid management.    Recommend rechecking blood work in 3 months prior to the next office visit.  We will check lipids prior to the next office visit  Orders Placed This Encounter  Procedures  . Lipid Panel With LDL/HDL Ratio  . LDL cholesterol, direct  . Comp. Metabolic Panel (12)  . EKG 12-Lead   --Continue cardiac medications as reconciled in final medication list. --Return in about 3 months (around 10/26/2020) for Follow up, Lipid, Review test results. or sooner if needed. --Continue follow-up with your primary care physician regarding the management of your other chronic comorbid conditions.  Patient's questions and concerns were addressed to his satisfaction. He voices understanding of the instructions provided during this encounter.   This note was created using a voice recognition software as a result there may be grammatical errors inadvertently enclosed that do not reflect the nature of this encounter. Every attempt is made to correct such errors.  Time spent: 30 minutes.  Tessa Lerner, [place], [facility]  Pager: [phone] Office: [phone]

## 2020-07-26 ENCOUNTER — Other Ambulatory Visit: Payer: Self-pay | Admitting: Orthopedic Surgery

## 2020-07-26 DIAGNOSIS — M545 Low back pain, unspecified: Secondary | ICD-10-CM

## 2020-07-26 DIAGNOSIS — M5442 Lumbago with sciatica, left side: Secondary | ICD-10-CM

## 2020-08-02 ENCOUNTER — Other Ambulatory Visit: Payer: Self-pay | Admitting: Medical

## 2020-08-02 DIAGNOSIS — E038 Other specified hypothyroidism: Secondary | ICD-10-CM

## 2020-08-05 ENCOUNTER — Ambulatory Visit
Admission: RE | Admit: 2020-08-05 | Discharge: 2020-08-05 | Disposition: A | Discharge status: Home / Self Care | Payer: 59 | Source: Ambulatory Visit | Attending: Orthopedic Surgery | Admitting: Orthopedic Surgery

## 2020-08-05 ENCOUNTER — Other Ambulatory Visit: Payer: Self-pay

## 2020-08-05 DIAGNOSIS — M545 Low back pain, unspecified: Secondary | ICD-10-CM

## 2020-08-05 DIAGNOSIS — M5442 Lumbago with sciatica, left side: Secondary | ICD-10-CM

## 2020-08-13 ENCOUNTER — Other Ambulatory Visit: Payer: Self-pay | Admitting: Medical

## 2020-08-17 ENCOUNTER — Other Ambulatory Visit (HOSPITAL_COMMUNITY): Payer: Self-pay | Admitting: Physical Medicine and Rehabilitation

## 2020-08-20 ENCOUNTER — Telehealth (HOSPITAL_COMMUNITY): Payer: Self-pay

## 2020-08-20 NOTE — Telephone Encounter (Signed)
Called to schedule lumbar injection, no answer, left vm. AW 

## 2020-08-29 ENCOUNTER — Telehealth (HOSPITAL_COMMUNITY): Payer: Self-pay

## 2020-08-29 NOTE — Telephone Encounter (Signed)
Called to schedule lumbar injection, no answer, left vm. AW 

## 2020-09-04 ENCOUNTER — Other Ambulatory Visit (HOSPITAL_COMMUNITY): Payer: Self-pay | Admitting: Physical Medicine and Rehabilitation

## 2020-09-04 ENCOUNTER — Telehealth (HOSPITAL_COMMUNITY): Payer: Self-pay

## 2020-09-04 DIAGNOSIS — M47816 Spondylosis without myelopathy or radiculopathy, lumbar region: Secondary | ICD-10-CM

## 2020-09-04 NOTE — Telephone Encounter (Signed)
-----   Message from Oley Balm, MD sent at 09/04/2020  3:26 PM EST ----- Regarding: RE: bilateral lumbar injection That is fine Thx DDH ----- Message ----- From: Anderson Malta Sent: 09/04/2020   2:45 PM EST To: Oley Balm, MD Subject: bilateral lumbar injection                     Dr. Deanne Coffer,   I've put this patient on for you here at [facility] on 3/4.   L4-5, L5-S1 bilateral lumbar facet injections  Ordering: Dr. Claria Dice [facility] [phone])  MRI lumbar is in epic.   Please let me know if you can't do. Pt is over the weight limit and unable to have done at [facility].   Thanks,  Fara Boros

## 2020-09-05 ENCOUNTER — Telehealth (INDEPENDENT_AMBULATORY_CARE_PROVIDER_SITE_OTHER): Payer: 59 | Admitting: Medical

## 2020-09-05 ENCOUNTER — Other Ambulatory Visit: Payer: Self-pay

## 2020-09-05 ENCOUNTER — Other Ambulatory Visit: Payer: 59

## 2020-09-05 ENCOUNTER — Encounter: Payer: Self-pay | Admitting: Medical

## 2020-09-05 VITALS — Ht 68.0 in | Wt >= 6400 oz

## 2020-09-05 DIAGNOSIS — R112 Nausea with vomiting, unspecified: Secondary | ICD-10-CM | POA: Diagnosis not present

## 2020-09-05 DIAGNOSIS — E039 Hypothyroidism, unspecified: Secondary | ICD-10-CM | POA: Insufficient documentation

## 2020-09-05 DIAGNOSIS — R197 Diarrhea, unspecified: Secondary | ICD-10-CM | POA: Diagnosis not present

## 2020-09-05 DIAGNOSIS — R109 Unspecified abdominal pain: Secondary | ICD-10-CM | POA: Diagnosis not present

## 2020-09-05 DIAGNOSIS — K921 Melena: Secondary | ICD-10-CM

## 2020-09-05 DIAGNOSIS — Z113 Encounter for screening for infections with a predominantly sexual mode of transmission: Secondary | ICD-10-CM

## 2020-09-05 DIAGNOSIS — R1110 Vomiting, unspecified: Secondary | ICD-10-CM | POA: Insufficient documentation

## 2020-09-05 NOTE — Progress Notes (Signed)
Subjective:     Patient ID: Patrick Baxter, male   DOB: March 09, 1971, 50 y.o.   MRN: [id number]  This visit type was conducted due to national recommendations for restrictions regarding the COVID-19 Pandemic (e.g. social distancing) in an effort to limit this patient's exposure and mitigate transmission in our community.  Due to their co-morbid illnesses, this patient is at least at moderate risk for complications without adequate follow up.  This format is felt to be most appropriate for this patient at this time.    Documentation for virtual audio and video telecommunications through [place] encounter:  The patient was located at home. The provider was located in the office. The patient did consent to this visit and is aware of possible charges through their insurance for this visit.  The other persons participating in this telemedicine service were none. Time spent on call was 32 minutes and in review of previous records 40 minutes total.  This virtual service is not related to other E/M service within previous 7 days.   HPI Chief Complaint  Patient presents with  . Abdominal Pain    Started 2 weeks ago. Had diarrhea and fatigue yesterday and this morning    Having ongoing problems with nausea, diarrhea.  Started feeling bad, nausea 2 weeks ago.  Then one day came home from work with projectile vomiting and diarrhea.  vomiting stopped the next day.  Diarrhea continued for about 3 days.  Stayed at home that week due to fatigue, no energy.   Was only eating gatorade and chicken broth.   Even though he was feeling better, has had some lingering occasionall loose stools.  Yesterday felt weak and shaky.    In the last few days has had some diarrhea, intermittent, sometimes none in a day, sometimes 2-3 times per day.  No more nausea or vomiting.  Has seen some blood in stool.  No fever.    No urinary c/o.   No new back pain but has upset stomach feeling.  No distinct abdominal pain.  Has  tried imodium once yesterday.  No recent travel.  No recent antibiotic use.  No new animal exposure.    About Saturday 2 weeks ago, about 3 days before symptoms began, he came in contact with some fecal matter with sexual encounter.    No jaundice, no skin itching.     Past Medical History:  Diagnosis Date  . Allergy   . Anxiety   . Back pain   . Bilateral chronic knee pain   . Chronic headaches   . Depression   . Dermatographism   . Dry skin   . Fatigue   . Frequent urination   . Gout   . Hx of echocardiogram 09/07/2019   normal LV function, EF 60-65%, no wall motion abnormality; Dr. Rex Kras  . Hyperlipidemia   . Hypertension   . Joint pain   . Knee pain   . Obesity   . Pre-diabetes   . Sleep apnea   . Stress   . Wears glasses    Past Surgical History:  Procedure Laterality Date  . KNEE SURGERY  11/2006   right knee arthroscopy and meniscal repair, Dr. Lanny Cramp    Review of Systems As in subjective    Objective:   Physical Exam Due to coronavirus pandemic stay at home measures, patient visit was virtual and they were not examined in person.   Ht 5' 8"  (1.727 m)   Wt (!) 450 lb (204.1 kg)  BMI 68.42 kg/m   Gen: wd, wn, nad Psych: answers questions in complete sentences, normal affect      Assessment:     Encounter Diagnoses  Name Primary?  . Diarrhea of presumed infectious origin Yes  . Non-intractable vomiting with nausea, unspecified vomiting type   . Abdominal discomfort   . Screen for STD (sexually transmitted disease)   . Blood in stool   . Hypothyroidism, unspecified type         Plan:     We discussed symptoms and concerns.  He had potential exposure to stool.  He continues to have diarrhea.  Blood in the stool.  He will come in for labs today this afternoon and collect stool kit to do stool profile  In the meantime advise good hydration, take a probiotic, avoid greasy food, big portions another foods that would aggravate nausea or  diarrhea  Advised condom use  Patrick Baxter was seen today for abdominal pain.  Diagnoses and all orders for this visit:  Diarrhea of presumed infectious origin -     Comprehensive metabolic panel -     CBC with Differential/Platelet -     Hepatitis panel, acute -     GI Profile, Stool, PCR -     HIV Antibody (routine testing w rflx) -     RPR -     GC/Chlamydia Probe Amp  Non-intractable vomiting with nausea, unspecified vomiting type -     Comprehensive metabolic panel -     CBC with Differential/Platelet -     Hepatitis panel, acute -     GI Profile, Stool, PCR -     HIV Antibody (routine testing w rflx) -     RPR -     GC/Chlamydia Probe Amp  Abdominal discomfort -     Comprehensive metabolic panel -     CBC with Differential/Platelet -     Hepatitis panel, acute -     GI Profile, Stool, PCR -     HIV Antibody (routine testing w rflx) -     RPR -     GC/Chlamydia Probe Amp  Screen for STD (sexually transmitted disease) -     Comprehensive metabolic panel -     CBC with Differential/Platelet -     Hepatitis panel, acute -     GI Profile, Stool, PCR -     HIV Antibody (routine testing w rflx) -     RPR -     GC/Chlamydia Probe Amp  Blood in stool  Hypothyroidism, unspecified type -     TSH  f/u pending studies

## 2020-09-05 NOTE — Addendum Note (Signed)
Addended by: Jac Canavan on: 09/05/2020 08:43 AM   Modules accepted: Orders

## 2020-09-06 LAB — COMPREHENSIVE METABOLIC PANEL
ALT: 43 IU/L (ref 0–44)
AST: 34 IU/L (ref 0–40)
Albumin/Globulin Ratio: 1.3 (ref 1.2–2.2)
Albumin: 4 g/dL (ref 3.8–4.9)
Alkaline Phosphatase: 84 IU/L (ref 44–121)
BUN/Creatinine Ratio: 17 (ref 9–20)
BUN: 16 mg/dL (ref 6–24)
Bilirubin Total: 0.3 mg/dL (ref 0.0–1.2)
CO2: 23 mmol/L (ref 20–29)
Calcium: 10 mg/dL (ref 8.7–10.2)
Chloride: 99 mmol/L (ref 96–106)
Creatinine, Ser: 0.93 mg/dL (ref 0.76–1.27)
GFR calc Af Amer: 109 mL/min/{1.73_m2} (ref >59)
GFR calc non Af Amer: 95 mL/min/{1.73_m2} (ref >59)
Globulin, Total: 3 g/dL (ref 1.5–4.5)
Glucose: 140 mg/dL — ABNORMAL HIGH (ref 65–99)
Potassium: 4.4 mmol/L (ref 3.5–5.2)
Sodium: 141 mmol/L (ref 134–144)
Total Protein: 7 g/dL (ref 6.0–8.5)

## 2020-09-06 LAB — CBC WITH DIFFERENTIAL/PLATELET
Basophils Absolute: 0.1 10*3/uL (ref 0.0–0.2)
Basos: 1 %
EOS (ABSOLUTE): 0.2 10*3/uL (ref 0.0–0.4)
Eos: 2 %
Hematocrit: 40.8 % (ref 37.5–51.0)
Hemoglobin: 14.5 g/dL (ref 13.0–17.7)
Immature Grans (Abs): 0 10*3/uL (ref 0.0–0.1)
Immature Granulocytes: 1 %
Lymphocytes Absolute: 1.9 10*3/uL (ref 0.7–3.1)
Lymphs: 22 %
MCH: 31.9 pg (ref 26.6–33.0)
MCHC: 35.5 g/dL (ref 31.5–35.7)
MCV: 90 fL (ref 79–97)
Monocytes Absolute: 0.8 10*3/uL (ref 0.1–0.9)
Monocytes: 9 %
Neutrophils Absolute: 5.6 10*3/uL (ref 1.4–7.0)
Neutrophils: 65 %
Platelets: 191 10*3/uL (ref 150–450)
RBC: 4.55 x10E6/uL (ref 4.14–5.80)
RDW: 12.9 % (ref 11.6–15.4)
WBC: 8.6 10*3/uL (ref 3.4–10.8)

## 2020-09-06 LAB — HEPATITIS PANEL, ACUTE
Hep A IgM: NEGATIVE
Hep B C IgM: NEGATIVE
Hep C Virus Ab: <0.1 s/co ratio (ref 0.0–0.9)
Hepatitis B Surface Ag: NEGATIVE

## 2020-09-06 LAB — RPR: RPR Ser Ql: NONREACTIVE

## 2020-09-06 LAB — TSH: TSH: 3.23 u[IU]/mL (ref 0.450–4.500)

## 2020-09-06 LAB — HIV ANTIBODY (ROUTINE TESTING W REFLEX): HIV Screen 4th Generation wRfx: NONREACTIVE

## 2020-09-08 LAB — GC/CHLAMYDIA PROBE AMP
Chlamydia trachomatis, NAA: NEGATIVE
Neisseria Gonorrhoeae by PCR: NEGATIVE

## 2020-09-09 LAB — GI PROFILE, STOOL, PCR

## 2020-09-14 ENCOUNTER — Other Ambulatory Visit: Payer: Self-pay

## 2020-09-14 ENCOUNTER — Other Ambulatory Visit (HOSPITAL_COMMUNITY): Payer: Self-pay | Admitting: Physical Medicine and Rehabilitation

## 2020-09-14 ENCOUNTER — Ambulatory Visit (HOSPITAL_COMMUNITY)
Admission: RE | Admit: 2020-09-14 | Discharge: 2020-09-14 | Disposition: A | Discharge status: Home / Self Care | Payer: 59 | Source: Ambulatory Visit | Attending: Physical Medicine and Rehabilitation | Admitting: Physical Medicine and Rehabilitation

## 2020-09-14 DIAGNOSIS — M47816 Spondylosis without myelopathy or radiculopathy, lumbar region: Secondary | ICD-10-CM

## 2020-09-14 DIAGNOSIS — M47819 Spondylosis without myelopathy or radiculopathy, site unspecified: Secondary | ICD-10-CM | POA: Insufficient documentation

## 2020-09-14 HISTORY — PX: IR INJECT/THERA/INC NEEDLE/CATH/PLC EPI/LUMB/SAC W/IMG: IMG6130

## 2020-09-14 MED ORDER — METHYLPREDNISOLONE ACETATE 40 MG/ML IJ SUSP
INTRAMUSCULAR | Status: AC
  Administered 2020-09-14: 40 mg
  Filled 2020-09-14: qty 1

## 2020-09-14 MED ORDER — IOHEXOL 180 MG/ML  SOLN
20.0000 mL | Freq: Once | INTRAMUSCULAR | Status: AC | PRN
  Administered 2020-09-14: 5 mL

## 2020-09-14 MED ORDER — LIDOCAINE HCL 1 % IJ SOLN
INTRAMUSCULAR | Status: AC
  Filled 2020-09-14: qty 20

## 2020-09-14 MED ORDER — METHYLPREDNISOLONE ACETATE 80 MG/ML IJ SUSP
INTRAMUSCULAR | Status: AC
  Administered 2020-09-14: 80 mg
  Filled 2020-09-14: qty 1

## 2020-09-14 MED ORDER — SODIUM CHLORIDE (PF) 0.9 % IJ SOLN
INTRAMUSCULAR | Status: AC
  Filled 2020-09-14: qty 10

## 2020-09-14 MED ORDER — LIDOCAINE HCL 1 % IJ SOLN
INTRAMUSCULAR | Status: DC | PRN
  Administered 2020-09-14: 5 mL

## 2020-09-14 MED ORDER — BUPIVACAINE HCL 0.25 % IJ SOLN
INTRAMUSCULAR | Status: DC | PRN
  Administered 2020-09-14: 5 mL

## 2020-09-14 MED ORDER — BUPIVACAINE HCL (PF) 0.25 % IJ SOLN
INTRAMUSCULAR | Status: AC
  Filled 2020-09-14: qty 30

## 2020-09-14 NOTE — Procedures (Signed)
  Procedure: Bilat L4/5 and L5/S1 facet injections under fluoro   EBL:   minimal Complications:  none immediate  See full dictation in [employer].  Thora Lance MD Main # [phone] Pager  [phone] Mobile [phone]

## 2020-09-28 ENCOUNTER — Other Ambulatory Visit: Payer: Self-pay | Admitting: Cardiology

## 2020-09-28 DIAGNOSIS — I10 Essential (primary) hypertension: Secondary | ICD-10-CM

## 2020-09-28 DIAGNOSIS — R0609 Other forms of dyspnea: Secondary | ICD-10-CM

## 2020-09-28 DIAGNOSIS — R0600 Dyspnea, unspecified: Secondary | ICD-10-CM

## 2020-10-03 ENCOUNTER — Encounter: Payer: 59 | Admitting: Medical

## 2020-10-22 ENCOUNTER — Ambulatory Visit (INDEPENDENT_AMBULATORY_CARE_PROVIDER_SITE_OTHER): Payer: 59 | Admitting: Medical

## 2020-10-22 ENCOUNTER — Encounter: Payer: Self-pay | Admitting: Medical

## 2020-10-22 VITALS — BP 138/80 | HR 79 | Ht 68.0 in | Wt >= 6400 oz

## 2020-10-22 DIAGNOSIS — J301 Allergic rhinitis due to pollen: Secondary | ICD-10-CM

## 2020-10-22 DIAGNOSIS — Z1211 Encounter for screening for malignant neoplasm of colon: Secondary | ICD-10-CM | POA: Diagnosis not present

## 2020-10-22 DIAGNOSIS — M1000 Idiopathic gout, unspecified site: Secondary | ICD-10-CM

## 2020-10-22 DIAGNOSIS — G4733 Obstructive sleep apnea (adult) (pediatric): Secondary | ICD-10-CM

## 2020-10-22 DIAGNOSIS — M545 Low back pain, unspecified: Secondary | ICD-10-CM

## 2020-10-22 DIAGNOSIS — F419 Anxiety disorder, unspecified: Secondary | ICD-10-CM

## 2020-10-22 DIAGNOSIS — M51369 Other intervertebral disc degeneration, lumbar region without mention of lumbar back pain or lower extremity pain: Secondary | ICD-10-CM

## 2020-10-22 DIAGNOSIS — Z0000 Encounter for general adult medical examination without abnormal findings: Secondary | ICD-10-CM

## 2020-10-22 DIAGNOSIS — M25562 Pain in left knee: Secondary | ICD-10-CM

## 2020-10-22 DIAGNOSIS — S22000A Wedge compression fracture of unspecified thoracic vertebra, initial encounter for closed fracture: Secondary | ICD-10-CM

## 2020-10-22 DIAGNOSIS — E559 Vitamin D deficiency, unspecified: Secondary | ICD-10-CM

## 2020-10-22 DIAGNOSIS — E039 Hypothyroidism, unspecified: Secondary | ICD-10-CM

## 2020-10-22 DIAGNOSIS — M5136 Other intervertebral disc degeneration, lumbar region: Secondary | ICD-10-CM

## 2020-10-22 DIAGNOSIS — L989 Disorder of the skin and subcutaneous tissue, unspecified: Secondary | ICD-10-CM | POA: Insufficient documentation

## 2020-10-22 DIAGNOSIS — M4316 Spondylolisthesis, lumbar region: Secondary | ICD-10-CM

## 2020-10-22 DIAGNOSIS — I10 Essential (primary) hypertension: Secondary | ICD-10-CM

## 2020-10-22 DIAGNOSIS — R7301 Impaired fasting glucose: Secondary | ICD-10-CM

## 2020-10-22 DIAGNOSIS — M25561 Pain in right knee: Secondary | ICD-10-CM

## 2020-10-22 DIAGNOSIS — Z8249 Family history of ischemic heart disease and other diseases of the circulatory system: Secondary | ICD-10-CM

## 2020-10-22 DIAGNOSIS — E538 Deficiency of other specified B group vitamins: Secondary | ICD-10-CM

## 2020-10-22 DIAGNOSIS — Z125 Encounter for screening for malignant neoplasm of prostate: Secondary | ICD-10-CM | POA: Insufficient documentation

## 2020-10-22 DIAGNOSIS — Z7185 Encounter for immunization safety counseling: Secondary | ICD-10-CM | POA: Diagnosis not present

## 2020-10-22 DIAGNOSIS — F32A Depression, unspecified: Secondary | ICD-10-CM

## 2020-10-22 DIAGNOSIS — G8929 Other chronic pain: Secondary | ICD-10-CM

## 2020-10-22 NOTE — Patient Instructions (Signed)
This visit was a preventative care visit, also known as wellness visit or routine physical.   Topics typically include healthy lifestyle, diet, exercise, preventative care, vaccinations, sick and well care, proper use of emergency dept and after hours care, as well as other concerns.     Recommendations: Continue to return yearly for your annual wellness and preventative care visits.  This gives [place] a chance to discuss healthy lifestyle, exercise, vaccinations, review your chart record, and perform screenings where appropriate.  I recommend you see your eye doctor yearly for routine vision care.  I recommend you see your dentist yearly for routine dental care including hygiene visits twice yearly.   Vaccination recommendations were reviewed Immunization History  Administered Date(s) Administered  . Influenza,inj,Quad PF,6+ Mos 04/20/2014, 09/20/2015, 06/24/2016, 04/14/2017  . Moderna Sars-Covid-2 Vaccination 06/12/2020  . PFIZER(Purple Top)SARS-COV-2 Vaccination 09/28/2019, 10/19/2019  . Tdap 10/28/2016   Shingles vaccine:  I recommend you have a shingles vaccine to help prevent shingles or herpes zoster outbreak.   Please call your insurer to inquire about coverage for the Shingrix vaccine given in 2 doses.   Some insurers cover this vaccine after age 61, some cover this after age 15.  If your insurer covers this, then call to schedule appointment to have this vaccine here.    Screening for cancer: Colon cancer screening: He requested Cologuard referral  Please call your insurance company to check coverage for colon cancer screening.  Options may include Cologard stool test or Colonoscopy.  You should also inquire about which facility the colonoscopy could be performed, and coverage for diagnostic vs screening colonoscopy as coverage may vary.  If you have significant family history of colon cancer or blood in the stool, then you should only do the colonoscopy, not the cologard  test.   We discussed PSA, prostate exam, and prostate cancer screening risks/benefits.     Skin cancer screening: Check your skin regularly for new changes, growing lesions, or other lesions of concern Come in for evaluation if you have skin lesions of concern.  Lung cancer screening: If you have a greater than 20 pack year history of tobacco use, then you may qualify for lung cancer screening with a chest CT scan.   Please call your insurance company to inquire about coverage for this test.  We currently don't have screenings for other cancers besides breast, cervical, colon, and lung cancers.  If you have a strong family history of cancer or have other cancer screening concerns, please let me know.    Bone health: Get at least 150 minutes of aerobic exercise weekly Get weight bearing exercise at least once weekly Bone density test:   A bone density test is an imaging test that uses a type of X-ray to measure the amount of calcium and other minerals in your bones.  The test may be used to diagnose or screen you for a condition that causes weak or thin bones (osteoporosis), predict your risk for a broken bone (fracture), or determine how well your osteoporosis treatment is working. The bone density test is recommended for females 65 and older, or females or males <65 if certain risk factors such as thyroid disease, long term use of steroids such as for asthma or rheumatological issues, vitamin D deficiency, estrogen deficiency, family history of osteoporosis, self or family history of fragility fracture in first degree relative.    Heart health: Get at least 150 minutes of aerobic exercise weekly Limit alcohol It is important to maintain a  healthy blood pressure and healthy cholesterol numbers  Continue follow-up with cardiology  Medical care options: I recommend you continue to seek care here first for routine care.  We try really hard to have available appointments Monday through  Friday daytime hours for sick visits, acute visits, and physicals.  Urgent care should be used for after hours and weekends for significant issues that cannot wait till the next day.  The emergency department should be used for significant potentially life-threatening emergencies.  The emergency department is expensive, can often have long wait times for less significant concerns, so try to utilize primary care, urgent care, or telemedicine when possible to avoid unnecessary trips to the emergency department.  Virtual visits and telemedicine have been introduced since the pandemic started in 2020, and can be convenient ways to receive medical care.  We offer virtual appointments as well to assist you in a variety of options to seek medical care.    Separate significant issues discussed: Sleep apnea-continue CPAP therapy  Obesity-I strongly recommend referral to bariatric clinic for consult  Hypertension-continue current medication, follow-up with cardiology  Dyslipidemia-I recommend you take some type of medication daily to help lower your risk of heart disease and stroke.  You have not tolerated fenofibrate or a few statins.  If agreeable we can try a lower dosage of something like Livalo 3 days a week which may be a better approach  Chronic knee and back pain-continue follow-up with orthopedics  Impaired glucose/prediabetes-labs today.  I recommend you work on losing weight through healthy diet and exercise.  Recommend bariatric clinic referral to address obesity in general  Gout, elevated uric acid-uric acid level today.  I recommend you take the allopurinol daily in the morning  Hypothyroidism- I recommend you take your medicine daily in the morning on empty stomach 30 to 45 minutes before other medicines or breakfast  Vitamin D deficiency-continue supplement  Skin lesion-I recommend you call and get a follow-up appointment with dermatology

## 2020-10-22 NOTE — Progress Notes (Signed)
Subjective:   HPI  Patrick Baxter is a 50 y.o. male who presents for Chief Complaint  Patient presents with  . Annual Exam    Pt present for cpe with fasting labs     Patient Care Team: Kallin Henk, Cleda Mccreedy as PCP - General (Family Medicine) Sees dentist Sees eye doctor Dr. Tessa Lerner, cardiology [facility]  Concerns: Recently been dealing with some stress.  Father had a bad fall, at [facility].  Father in rehab currently due to brain bleed and injury from the fall.  recently paternal uncle passed away.  He has ongoing problems with his back and knees.  Sees orthopedics.  Had an epidural steroid injection in the back.  He reports that his orthopedic issues have limited exercise.  OSA-compliant with CPAP most of the time  Elevated uric acid-not always great about taking allopurinol  Hypothyroidism - compliant with medication   Reviewed their medical, surgical, family, social, medication, and allergy history and updated chart as appropriate.  Past Medical History:  Diagnosis Date  . Allergy   . Anxiety   . Back pain   . Bilateral chronic knee pain   . Chronic headaches   . Depression   . Dermatographism   . Dry skin   . Fatigue   . Frequent urination   . Gout   . Hx of echocardiogram 09/07/2019   normal LV function, EF 60-65%, no wall motion abnormality; Dr. Tessa Lerner  . Hyperlipidemia   . Hypertension   . Joint pain   . Knee pain   . Obesity   . Pre-diabetes   . Sleep apnea   . Stress   . Wears glasses     Past Surgical History:  Procedure Laterality Date  . IR INJECT/THERA/INC NEEDLE/CATH/PLC EPI/LUMB/SAC W/IMG  09/14/2020  . IR INJECT/THERA/INC NEEDLE/CATH/PLC EPI/LUMB/SAC W/IMG  09/14/2020  . KNEE SURGERY  11/2006   right knee arthroscopy and meniscal repair, Dr. Conni Elliot    Family History  Problem Relation Age of Onset  . Alzheimer's disease Mother   . Heart disease Mother        89, angioplasty, CAD  . Cancer Mother         breast  . Stroke Mother   . Hypertension Mother   . Hyperlipidemia Mother   . Heart disease Father        angina  . Arthritis Father   . Cancer Maternal Grandmother        leukemia  . Diabetes Paternal Grandmother      Current Outpatient Medications:  .  allopurinol (ZYLOPRIM) 300 MG tablet, TAKE 1 TABLET(300 MG) BY MOUTH DAILY, Disp: 90 tablet, Rfl: 3 .  Calcium-Magnesium-Zinc 167-83-8 MG TABS, Take 1 tablet by mouth daily., Disp: , Rfl:  .  cholecalciferol (VITAMIN D) 1000 units tablet, Take 1 tablet (1,000 Units total) by mouth daily., Disp: 90 tablet, Rfl: 3 .  Coenzyme Q10 (CO Q 10 PO), Take by mouth., Disp: , Rfl:  .  cyclobenzaprine (FLEXERIL) 10 MG tablet, Take 1/2 to 1 tablet every night as needed at bedtime ., Disp: 30 tablet, Rfl: 3 .  FLUoxetine (PROZAC) 40 MG capsule, TAKE 1 CAPSULE(40 MG) BY MOUTH DAILY, Disp: 90 capsule, Rfl: 0 .  hydrochlorothiazide (HYDRODIURIL) 25 MG tablet, TAKE 1 TABLET(25 MG) BY MOUTH IN THE MORNING, Disp: 90 tablet, Rfl: 3 .  levothyroxine (SYNTHROID) 25 MCG tablet, TAKE 1 TABLET(25 MCG) BY MOUTH DAILY BEFORE BREAKFAST, Disp: 90 tablet, Rfl: 0 .  lisinopril (ZESTRIL) 20 MG tablet, TAKE 1 TABLET(20 MG) BY MOUTH DAILY, Disp: 30 tablet, Rfl: 6 .  metoprolol succinate (TOPROL-XL) 50 MG 24 hr tablet, TAKE 1 TABLET BY MOUTH DAILY WITH OR IMMEDIATELY FOLLOWING A MEAL, Disp: 90 tablet, Rfl: 0 .  montelukast (SINGULAIR) 10 MG tablet, TAKE 1 TABLET(10 MG) BY MOUTH AT BEDTIME, Disp: 90 tablet, Rfl: 3 .  Multiple Vitamins-Minerals (MULTIVITAMIN WITH MINERALS) tablet, Take 1 tablet by mouth daily., Disp: 90 tablet, Rfl: 3 .  naproxen (NAPROSYN) 250 MG tablet, Take by mouth 3 (three) times daily with meals., Disp: , Rfl:  .  Omega-3 Fatty Acids (SALMON OIL-1000) 200 MG CAPS, Take by mouth., Disp: , Rfl:  .  B Complex-C (PROBEC-T PO), Take by mouth. (Patient not taking: Reported on 10/22/2020), Disp: , Rfl:  .  Bempedoic Acid (NEXLETOL) 180 MG TABS, Take 180  mg by mouth daily. (Patient not taking: No sig reported), Disp: 90 tablet, Rfl: 0 .  fenofibrate (TRICOR) 145 MG tablet, Take 1 tablet (145 mg total) by mouth daily. (Patient not taking: Reported on 10/22/2020), Disp: 90 tablet, Rfl: 0 .  nitroGLYCERIN (NITROSTAT) 0.4 MG SL tablet, Place 1 tablet (0.4 mg total) under the tongue every 5 (five) minutes as needed for chest pain. (Patient not taking: Reported on 10/22/2020), Disp: 30 tablet, Rfl: 1  Allergies  Allergen Reactions  . Atorvastatin Other (See Comments)    myalgia  . Pravastatin Sodium Other (See Comments)    myalgia     Review of Systems Constitutional: -fever, -chills, -sweats, -unexpected weight change, -decreased appetite, -fatigue Allergy: -sneezing, -itching, -congestion Dermatology: -changing moles, --rash, -lumps ENT: -runny nose, -ear pain, -sore throat, -hoarseness, -sinus pain, -teeth pain, - ringing in ears, -hearing loss, -nosebleeds Cardiology: -chest pain, -palpitations, -swelling, -difficulty breathing when lying flat, -waking up short of breath Respiratory: -cough, -shortness of breath, -difficulty breathing with exercise or exertion, -wheezing, -coughing up blood Gastroenterology: -abdominal pain, -nausea, -vomiting, -diarrhea, -constipation, -blood in stool, -changes in bowel movement, -difficulty swallowing or eating Hematology: -bleeding, -bruising  Musculoskeletal: +joint aches, -muscle aches, -joint swelling, +back pain, -neck pain, -cramping, -changes in gait Ophthalmology: denies vision changes, eye redness, itching, discharge Urology: -burning with urination, -difficulty urinating, -blood in urine, -urinary frequency, -urgency, -incontinence Neurology: -headache, -weakness, -tingling, -numbness, -memory loss, -falls, -dizziness Psychology: -depressed mood, -agitation, -sleep problems Male GU: no testicular mass, pain, no lymph nodes swollen, no swelling, no rash.   Depression screen [facility] 2/9 10/22/2020  08/03/2019 11/17/2017 11/17/2017 09/15/2017  Decreased Interest 0 0 0 0 3  Down, Depressed, Hopeless 0 0 1 1 3   PHQ - 2 Score 0 0 1 1 6   Altered sleeping - - 0 - 3  Tired, decreased energy - - 0 - 3  Change in appetite - - 1 - 3  Feeling bad or failure about yourself  - - 1 - 1  Trouble concentrating - - 0 - 1  Moving slowly or fidgety/restless - - 0 - 3  Suicidal thoughts - - 0 - 1  PHQ-9 Score - - 3 - 21  Difficult doing work/chores - - Somewhat difficult - Extremely dIfficult        Objective:  BP 138/80   Pulse 79   Ht 5\' 8"  (1.727 m)   Wt (!) 440 lb (199.6 kg)   SpO2 96%   BMI 66.90 kg/m   General appearance: alert, no distress, WD/WN, Caucasian male Skin: 3 raised  skin lesions of left cheek.  Each  approximately 4 mm diameter roundish but 2 are vertical appearing.  Several skin tags of left neck anteriorly. HEENT: normocephalic, conjunctiva/corneas normal, sclerae anicteric, PERRLA, EOMi, nares patent, no discharge or erythema, pharynx normal Neck: supple, no lymphadenopathy, no thyromegaly, no masses, normal ROM, no bruits Chest: non tender, normal shape and expansion Heart: RRR, normal S1, S2, no murmurs Lungs: CTA bilaterally, no wheezes, rhonchi, or rales Abdomen: +bs, soft, non tender, non distended, no masses, no hepatomegaly, no splenomegaly, no bruits Extremities: no edema, no cyanosis, no clubbing Pulses: 2+ symmetric, upper and lower extremities, normal cap refill Neurological: alert, oriented x 3, CN2-12 intact, strength normal upper extremities and lower extremities, sensation normal throughout, DTRs 2+ throughout, no cerebellar signs, gait normal Psychiatric: normal affect, behavior normal, pleasant  GU/rectal -deferred/declined   Assessment and Plan :   Encounter Diagnoses  Name Primary?  . Encounter for health maintenance examination in adult Yes  . Colon cancer screening   . Vitamin D deficiency   . Vaccine counseling   . Spondylolisthesis of lumbar  region   . Seasonal allergic rhinitis due to pollen   . Idiopathic gout, unspecified chronicity, unspecified site   . OSA (obstructive sleep apnea)   . Impaired fasting blood sugar   . Hypothyroidism, unspecified type   . Chronic pain of both knees   . Family history of premature CAD   . Essential hypertension   . Anxiety and depression   . B12 deficiency   . Closed compression fracture of thoracic vertebra, initial encounter (HCC)   . DDD (degenerative disc disease), lumbar   . Morbid obesity (HCC)   . Chronic bilateral low back pain without sciatica   . Screening for prostate cancer   . Skin lesion     This visit was a preventative care visit, also known as wellness visit or routine physical.   Topics typically include healthy lifestyle, diet, exercise, preventative care, vaccinations, sick and well care, proper use of emergency dept and after hours care, as well as other concerns.     Recommendations: Continue to return yearly for your annual wellness and preventative care visits.  This gives [place] a chance to discuss healthy lifestyle, exercise, vaccinations, review your chart record, and perform screenings where appropriate.  I recommend you see your eye doctor yearly for routine vision care.  I recommend you see your dentist yearly for routine dental care including hygiene visits twice yearly.   Vaccination recommendations were reviewed Immunization History  Administered Date(s) Administered  . Influenza,inj,Quad PF,6+ Mos 04/20/2014, 09/20/2015, 06/24/2016, 04/14/2017  . Moderna Sars-Covid-2 Vaccination 06/12/2020  . PFIZER(Purple Top)SARS-COV-2 Vaccination 09/28/2019, 10/19/2019  . Tdap 10/28/2016   Shingles vaccine:  I recommend you have a shingles vaccine to help prevent shingles or herpes zoster outbreak.   Please call your insurer to inquire about coverage for the Shingrix vaccine given in 2 doses.   Some insurers cover this vaccine after age 10, some cover this after  age 53.  If your insurer covers this, then call to schedule appointment to have this vaccine here.    Screening for cancer: Colon cancer screening: He requested Cologuard referral  Please call your insurance company to check coverage for colon cancer screening.  Options may include Cologard stool test or Colonoscopy.  You should also inquire about which facility the colonoscopy could be performed, and coverage for diagnostic vs screening colonoscopy as coverage may vary.  If you have significant family history of colon cancer or blood in the stool, then  you should only do the colonoscopy, not the cologard test.   We discussed PSA, prostate exam, and prostate cancer screening risks/benefits.     Skin cancer screening: Check your skin regularly for new changes, growing lesions, or other lesions of concern Come in for evaluation if you have skin lesions of concern.  Lung cancer screening: If you have a greater than 20 pack year history of tobacco use, then you may qualify for lung cancer screening with a chest CT scan.   Please call your insurance company to inquire about coverage for this test.  We currently don't have screenings for other cancers besides breast, cervical, colon, and lung cancers.  If you have a strong family history of cancer or have other cancer screening concerns, please let me know.    Bone health: Get at least 150 minutes of aerobic exercise weekly Get weight bearing exercise at least once weekly Bone density test:   A bone density test is an imaging test that uses a type of X-ray to measure the amount of calcium and other minerals in your bones.  The test may be used to diagnose or screen you for a condition that causes weak or thin bones (osteoporosis), predict your risk for a broken bone (fracture), or determine how well your osteoporosis treatment is working. The bone density test is recommended for females 65 and older, or females or males <65 if certain risk  factors such as thyroid disease, long term use of steroids such as for asthma or rheumatological issues, vitamin D deficiency, estrogen deficiency, family history of osteoporosis, self or family history of fragility fracture in first degree relative.    Heart health: Get at least 150 minutes of aerobic exercise weekly Limit alcohol It is important to maintain a healthy blood pressure and healthy cholesterol numbers  Continue follow-up with cardiology  Medical care options: I recommend you continue to seek care here first for routine care.  We try really hard to have available appointments Monday through Friday daytime hours for sick visits, acute visits, and physicals.  Urgent care should be used for after hours and weekends for significant issues that cannot wait till the next day.  The emergency department should be used for significant potentially life-threatening emergencies.  The emergency department is expensive, can often have long wait times for less significant concerns, so try to utilize primary care, urgent care, or telemedicine when possible to avoid unnecessary trips to the emergency department.  Virtual visits and telemedicine have been introduced since the pandemic started in 2020, and can be convenient ways to receive medical care.  We offer virtual appointments as well to assist you in a variety of options to seek medical care.    Separate significant issues discussed: Sleep apnea-continue CPAP therapy  Obesity-I strongly recommend referral to bariatric clinic for consult  Hypertension-continue current medication, follow-up with cardiology  Dyslipidemia-I recommend you take some type of medication daily to help lower your risk of heart disease and stroke.  You have not tolerated fenofibrate or a few statins.  If agreeable we can try a lower dosage of something like Livalo 3 days a week which may be a better approach  Chronic knee and back pain-continue follow-up with  orthopedics  Impaired glucose/prediabetes-labs today.  I recommend you work on losing weight through healthy diet and exercise.  Recommend bariatric clinic referral to address obesity in general  Gout, elevated uric acid-uric acid level today.  I recommend you take the allopurinol daily in the  morning  Hypothyroidism- I recommend you take your medicine daily in the morning on empty stomach 30 to 45 minutes before other medicines or breakfast  Vitamin D deficiency-continue supplement  Skin lesion-I recommend you call and get a follow-up appointment with dermatology   Cruze was seen today for annual exam.  Diagnoses and all orders for this visit:  Encounter for health maintenance examination in adult -     Hemoglobin A1c -     Uric acid -     PSA -     VITAMIN D 25 Hydroxy (Vit-D Deficiency, Fractures)  Colon cancer screening -     Cologuard  Vitamin D deficiency -     VITAMIN D 25 Hydroxy (Vit-D Deficiency, Fractures)  Vaccine counseling  Spondylolisthesis of lumbar region  Seasonal allergic rhinitis due to pollen  Idiopathic gout, unspecified chronicity, unspecified site -     Uric acid  OSA (obstructive sleep apnea)  Impaired fasting blood sugar -     Hemoglobin A1c  Hypothyroidism, unspecified type  Chronic pain of both knees  Family history of premature CAD  Essential hypertension  Anxiety and depression  B12 deficiency  Closed compression fracture of thoracic vertebra, initial encounter (HCC)  DDD (degenerative disc disease), lumbar  Morbid obesity (HCC)  Chronic bilateral low back pain without sciatica  Screening for prostate cancer -     PSA  Skin lesion   Follow-up pending labs, yearly for physical

## 2020-10-23 ENCOUNTER — Other Ambulatory Visit: Payer: Self-pay | Admitting: Medical

## 2020-10-23 DIAGNOSIS — E038 Other specified hypothyroidism: Secondary | ICD-10-CM

## 2020-10-23 LAB — PSA: Prostate Specific Ag, Serum: 0.2 ng/mL (ref 0.0–4.0)

## 2020-10-23 LAB — HEMOGLOBIN A1C
Est. average glucose Bld gHb Est-mCnc: 143 mg/dL
Hgb A1c MFr Bld: 6.6 % — ABNORMAL HIGH (ref 4.8–5.6)

## 2020-10-23 LAB — URIC ACID: Uric Acid: 9.4 mg/dL — ABNORMAL HIGH (ref 3.8–8.4)

## 2020-10-23 LAB — VITAMIN D 25 HYDROXY (VIT D DEFICIENCY, FRACTURES): Vit D, 25-Hydroxy: 32.9 ng/mL (ref 30.0–100.0)

## 2020-10-23 MED ORDER — METOPROLOL SUCCINATE ER 50 MG PO TB24: ORAL_TABLET | ORAL | 3 refills | Status: DC

## 2020-10-23 MED ORDER — VITAMIN D 25 MCG (1000 UNIT) PO TABS: 1000.0000 [IU] | ORAL_TABLET | Freq: Every day | ORAL | 3 refills | Status: DC

## 2020-10-23 MED ORDER — ALLOPURINOL 300 MG PO TABS: ORAL_TABLET | ORAL | 3 refills | Status: DC

## 2020-10-23 MED ORDER — LEVOTHYROXINE SODIUM 25 MCG PO TABS
25.0000 ug | ORAL_TABLET | Freq: Every day | ORAL | 0 refills | Status: DC
Start: 2020-10-23 — End: 2021-02-04

## 2020-10-24 ENCOUNTER — Encounter: Payer: Self-pay | Admitting: Medical

## 2020-11-02 LAB — COMP. METABOLIC PANEL (12)
AST: 27 IU/L (ref 0–40)
Albumin/Globulin Ratio: 1.2 (ref 1.2–2.2)
Albumin: 4.1 g/dL (ref 3.8–4.9)
Alkaline Phosphatase: 79 IU/L (ref 44–121)
BUN/Creatinine Ratio: 18 (ref 9–20)
BUN: 14 mg/dL (ref 6–24)
Bilirubin Total: 0.3 mg/dL (ref 0.0–1.2)
Calcium: 9.4 mg/dL (ref 8.7–10.2)
Chloride: 99 mmol/L (ref 96–106)
Creatinine, Ser: 0.79 mg/dL (ref 0.76–1.27)
Globulin, Total: 3.3 g/dL (ref 1.5–4.5)
Glucose: 122 mg/dL — ABNORMAL HIGH (ref 65–99)
Potassium: 4.6 mmol/L (ref 3.5–5.2)
Sodium: 138 mmol/L (ref 134–144)
Total Protein: 7.4 g/dL (ref 6.0–8.5)
eGFR: 108 mL/min/{1.73_m2} (ref >59)

## 2020-11-02 LAB — LIPID PANEL WITH LDL/HDL RATIO
Cholesterol, Total: 191 mg/dL (ref 100–199)
HDL: 37 mg/dL — ABNORMAL LOW (ref >39)
LDL Chol Calc (NIH): 129 mg/dL — ABNORMAL HIGH (ref 0–99)
LDL/HDL Ratio: 3.5 ratio (ref 0.0–3.6)
Triglycerides: 137 mg/dL (ref 0–149)
VLDL Cholesterol Cal: 25 mg/dL (ref 5–40)

## 2020-11-02 LAB — LDL CHOLESTEROL, DIRECT: LDL Direct: 123 mg/dL — ABNORMAL HIGH (ref 0–99)

## 2020-11-04 ENCOUNTER — Other Ambulatory Visit: Payer: Self-pay | Admitting: Medical

## 2020-11-04 MED ORDER — RYBELSUS 7 MG PO TABS: 1.0000 | ORAL_TABLET | Freq: Every day | ORAL | 1 refills | Status: DC

## 2020-11-05 NOTE — Progress Notes (Signed)
Patrick Baxter Date of Birth: Mar 26, 1971 MRN: [id number] Primary Care Provider:Tysinger, Kermit Balo, Baxter Primary Cardiologist: Patrick Lerner, DO, Patrick Baxter (established care 08/2019)  Date: 11/06/20 Last Office Visit: 07/24/2020  Chief Complaint  Patient presents with  . Mixed hyperlipidemia  . Results  . Follow-up   HPI  Patrick Baxter is a 50 y.o. male his past medical history and cardiovascular risk factors include: Non-insulin-dependent diabetes mellitus type 2, morbid obesity class III, hypertension, obstructive sleep apnea, and mixed hyperlipidemia.  Presents today to the office with a chief complaint of " 10-month follow-up for lipid management."  Patient was initially referred to the office at the request of his primary care provider for evaluation of abnormal EKG.  Since establishing care patient has undergone a cardiovascular evaluation outlined below.  And over the last several office visits trying to improve his modifiable cardiovascular risk factors.  At the last office visit I started him on Tricor given his hypertriglyceridemia and Nexletol for management hyperlipidemia.  However, patient states that he never got Nexletol approved and he tried Tricor for some time but was unable to tolerate it due to joint pains.  Independently reviewed the most recent lab work from November 01, 2020 with the patient at today's office visit.  Since January 2022 patient has implemented lifestyle modifications and has lost approximately 13 pounds.  He is congratulated on his efforts. Patient states that he is more compliant with the CPAP machine since it was refitted.  Patient denies any recent hospitalization for cardiovascular symptoms.  Denies angina chest pain or heart failure symptoms.  Patient states that his shortness of breath with effort related activities is chronic and stable and most likely secondary to his weight.  FUNCTIONAL STATUS: Does not anticipate any fracture or exercise  routine.  ALLERGIES: Allergies  Allergen Reactions  . Atorvastatin Other (See Comments)    myalgia  . Pravastatin Sodium Other (See Comments)    myalgia   MEDICATION LIST PRIOR TO VISIT: Current Outpatient Medications on File Prior to Visit  Medication Sig Dispense Refill  . allopurinol (ZYLOPRIM) 300 MG tablet TAKE 1 TABLET(300 MG) BY MOUTH DAILY 90 tablet 3  . B Complex-C (PROBEC-T PO) Take by mouth.    . Calcium-Magnesium-Zinc 167-83-8 MG TABS Take 1 tablet by mouth daily.    . cholecalciferol (VITAMIN D3) 25 MCG (1000 UNIT) tablet Take 1 tablet (1,000 Units total) by mouth daily. 90 tablet 3  . Coenzyme Q10 (CO Q 10 PO) Take by mouth.    . cyclobenzaprine (FLEXERIL) 10 MG tablet Take 1/2 to 1 tablet every night as needed at bedtime . 30 tablet 3  . FLUoxetine (PROZAC) 40 MG capsule TAKE 1 CAPSULE(40 MG) BY MOUTH DAILY 90 capsule 0  . hydrochlorothiazide (HYDRODIURIL) 25 MG tablet TAKE 1 TABLET(25 MG) BY MOUTH IN THE MORNING 90 tablet 3  . levothyroxine (SYNTHROID) 25 MCG tablet Take 1 tablet (25 mcg total) by mouth daily before breakfast. 90 tablet 0  . lisinopril (ZESTRIL) 20 MG tablet TAKE 1 TABLET(20 MG) BY MOUTH DAILY 30 tablet 6  . metoprolol succinate (TOPROL-XL) 50 MG 24 hr tablet Take with or immediately following a meal. 90 tablet 3  . montelukast (SINGULAIR) 10 MG tablet TAKE 1 TABLET(10 MG) BY MOUTH AT BEDTIME 90 tablet 3  . Multiple Vitamins-Minerals (MULTIVITAMIN WITH MINERALS) tablet Take 1 tablet by mouth daily. 90 tablet 3  . naproxen (NAPROSYN) 250 MG tablet Take by mouth 3 (three) times daily with meals.    Marland Kitchen  nitroGLYCERIN (NITROSTAT) 0.4 MG SL tablet Place 1 tablet (0.4 mg total) under the tongue every 5 (five) minutes as needed for chest pain. 30 tablet 1  . Omega-3 Fatty Acids (SALMON OIL-1000) 200 MG CAPS Take by mouth.    . Semaglutide (RYBELSUS) 7 MG TABS Take 1 tablet by mouth daily. 30 tablet 1   No current facility-administered medications on file prior  to visit.    PAST MEDICAL HISTORY: Past Medical History:  Diagnosis Date  . Allergy   . Anxiety   . Back pain   . Bilateral chronic knee pain   . Chronic headaches   . Depression   . Dermatographism   . Dry skin   . Fatigue   . Frequent urination   . Gout   . Hx of echocardiogram 09/07/2019   normal LV function, EF 60-65%, no wall motion abnormality; Dr. Tessa Baxter  . Hyperlipidemia   . Hypertension   . Joint pain   . Knee pain   . Obesity   . Pre-diabetes   . Sleep apnea   . Stress   . Wears glasses     PAST SURGICAL HISTORY: Past Surgical History:  Procedure Laterality Date  . IR INJECT/THERA/INC NEEDLE/CATH/PLC EPI/LUMB/SAC W/IMG  09/14/2020  . IR INJECT/THERA/INC NEEDLE/CATH/PLC EPI/LUMB/SAC W/IMG  09/14/2020  . KNEE SURGERY  11/2006   right knee arthroscopy and meniscal repair, Dr. Conni Elliot    FAMILY HISTORY: The patient family history includes Alzheimer's disease in his mother; Arthritis in his father; Cancer in his maternal grandmother and mother; Diabetes in his paternal grandmother; Heart disease in his father and mother; Hyperlipidemia in his mother; Hypertension in his mother; Stroke in his mother.   SOCIAL HISTORY:  The patient  reports that he has quit smoking. His smoking use included cigarettes. He has never used smokeless tobacco. He reports current alcohol use of about 3.0 standard drinks of alcohol per week. He reports that he does not use drugs.  Review of Systems  Constitutional: Negative for chills, fever and malaise/fatigue.  HENT: Negative for hoarse voice and nosebleeds.   Eyes: Negative for discharge, double vision and pain.  Cardiovascular: Negative for chest pain, claudication, dyspnea on exertion, leg swelling, near-syncope, orthopnea, palpitations, paroxysmal nocturnal dyspnea and syncope.  Respiratory: Positive for shortness of breath (chronic and fatigue). Negative for hemoptysis.   Musculoskeletal: Positive for arthritis and back pain.  Negative for muscle cramps and myalgias.  Gastrointestinal: Negative for abdominal pain, constipation, diarrhea, hematemesis, hematochezia, melena, nausea and vomiting.  Neurological: Negative for dizziness, headaches and light-headedness.   PHYSICAL EXAM: Vitals with BMI 11/06/2020 10/22/2020 09/05/2020  Height     Weight 443 lbs 440 lbs 450 lbs  BMI 67.37 66.92 68.44  Systolic 125 138 -  Diastolic 84 80 -  Pulse 97 79 -   Body mass index is 67.36 kg/m.  CONSTITUTIONAL: Well-developed and well-nourished. No acute distress.  SKIN: Skin is warm and dry. No rash noted. No cyanosis. No pallor. No jaundice HEAD: Normocephalic and atraumatic.  EYES: No scleral icterus MOUTH/THROAT: Moist oral membranes.  NECK: No JVD present. No thyromegaly noted. No carotid bruits  LYMPHATIC: No visible cervical adenopathy.  CHEST Normal respiratory effort. No intercostal retractions  LUNGS: Clear to auscultation bilaterally.  No stridor. No wheezes. No rales.  CARDIOVASCULAR: Regular positive S1-S2, no murmurs rubs or gallops appreciated. ABDOMINAL: Morbidly obese, soft, nontender, nondistended, positive bowel sounds in all 4 quadrants.  No apparent ascites.  EXTREMITIES: Warm to touch, +  1 pitting edema bilaterally, no peripheral edema  HEMATOLOGIC: No significant bruising NEUROLOGIC: Oriented to person, place, and time. Nonfocal. Normal muscle tone.  PSYCHIATRIC: Normal mood and affect. Normal behavior. Cooperative  CARDIAC DATABASE: EKG: 07/24/2020: Normal sinus rhythm, 84 bpm, incomplete right bundle branch block, without underlying injury pattern.  Echocardiogram:  09/07/2019: LVEF 60-65%, LVEF 60-65%, normal diastolic function, moderate to severe concentric LVH, left atrial cavity is dilated, mild TR.  Stress Testing: 11/11/2019 at [facility]: Small sized inferior fixed perfusion defect suggestive of small inferior scar.  Diaphragmatic attenuation which could decrease  specificity of the study.  Small sized mild intensity basal anterolateral fixed defect most likely secondary to chest wall attenuation and less likely tiny scar.  No evidence of ischemia.  Prognostically this is a low risk scan.  Heart Catheterization: None  LABORATORY DATA: CBC Latest Ref Rng & Units 09/05/2020 08/03/2019 09/15/2017  WBC 3.4 - 10.8 x10E3/uL 8.6 9.3 6.9  Hemoglobin 13.0 - 17.7 g/dL 63.1 49.7 02.6  Hematocrit 37.5 - 51.0 % 40.8 44.9 43.4  Platelets 150 - 450 x10E3/uL 191 191 162    CMP Latest Ref Rng & Units 11/01/2020 09/05/2020 09/06/2019  Glucose 65 - 99 mg/dL 378(H) 885(O) 277(A)  BUN 6 - 24 mg/dL 14 16 22   Creatinine 0.76 - 1.27 mg/dL 1.28 7.86  Sodium 134 - 144 mmol/L 138 141 136  Potassium 3.5 - 5.2 mmol/L 4.6 4.4 4.2  Chloride 96 - 106 mmol/L 99 99 98  CO2 20 - 29 mmol/L - 23 24  Calcium 8.7 - 10.2 mg/dL 9.4 7.67 9.8  Total Protein 6.0 - 8.5 g/dL 7.4 7.0 -  Total Bilirubin 0.0 - 1.2 mg/dL 0.3 0.3 -  Alkaline Phos 44 - 121 IU/L 79 84 -  AST 0 - 40 IU/L 27 34 -  ALT 0 - 44 IU/L - 43 -    Lipid Panel  Lab Results  Component Value Date   CHOL 191 11/01/2020   HDL 37 (L) 11/01/2020   LDLCALC 129 (H) 11/01/2020   LDLDIRECT 123 (H) 11/01/2020   TRIG 137 11/01/2020   CHOLHDL 5.1 (H) 08/03/2019    Lab Results  Component Value Date   HGBA1C 6.6 (H) 10/22/2020   HGBA1C 6.2 (H) 08/03/2019   HGBA1C 5.7 (H) 06/16/2018   No components found for: NTPROBNP Lab Results  Component Value Date   TSH 3.230 09/05/2020   TSH 3.170 08/03/2019   TSH 2.600 06/16/2018    FINAL MEDICATION LIST END OF ENCOUNTER: Meds ordered this encounter  Medications  . Pitavastatin Calcium (LIVALO) 2 MG TABS    Sig: Take 1 tablet (2 mg total) by mouth at bedtime.    Dispense:  90 tablet    Refill:  0    Medications Discontinued During This Encounter  Medication Reason  . fenofibrate (TRICOR) 145 MG tablet Error  . Bempedoic Acid (NEXLETOL) 180 MG TABS Change in therapy      Current Outpatient Medications:  .  allopurinol (ZYLOPRIM) 300 MG tablet, TAKE 1 TABLET(300 MG) BY MOUTH DAILY, Disp: 90 tablet, Rfl: 3 .  B Complex-C (PROBEC-T PO), Take by mouth., Disp: , Rfl:  .  Calcium-Magnesium-Zinc 167-83-8 MG TABS, Take 1 tablet by mouth daily., Disp: , Rfl:  .  cholecalciferol (VITAMIN D3) 25 MCG (1000 UNIT) tablet, Take 1 tablet (1,000 Units total) by mouth daily., Disp: 90 tablet, Rfl: 3 .  Coenzyme Q10 (CO Q 10 PO), Take by mouth., Disp: , Rfl:  .  cyclobenzaprine (FLEXERIL) 10 MG tablet, Take 1/2 to 1 tablet every night as needed at bedtime ., Disp: 30 tablet, Rfl: 3 .  FLUoxetine (PROZAC) 40 MG capsule, TAKE 1 CAPSULE(40 MG) BY MOUTH DAILY, Disp: 90 capsule, Rfl: 0 .  hydrochlorothiazide (HYDRODIURIL) 25 MG tablet, TAKE 1 TABLET(25 MG) BY MOUTH IN THE MORNING, Disp: 90 tablet, Rfl: 3 .  levothyroxine (SYNTHROID) 25 MCG tablet, Take 1 tablet (25 mcg total) by mouth daily before breakfast., Disp: 90 tablet, Rfl: 0 .  lisinopril (ZESTRIL) 20 MG tablet, TAKE 1 TABLET(20 MG) BY MOUTH DAILY, Disp: 30 tablet, Rfl: 6 .  metoprolol succinate (TOPROL-XL) 50 MG 24 hr tablet, Take with or immediately following a meal., Disp: 90 tablet, Rfl: 3 .  montelukast (SINGULAIR) 10 MG tablet, TAKE 1 TABLET(10 MG) BY MOUTH AT BEDTIME, Disp: 90 tablet, Rfl: 3 .  Multiple Vitamins-Minerals (MULTIVITAMIN WITH MINERALS) tablet, Take 1 tablet by mouth daily., Disp: 90 tablet, Rfl: 3 .  naproxen (NAPROSYN) 250 MG tablet, Take by mouth 3 (three) times daily with meals., Disp: , Rfl:  .  nitroGLYCERIN (NITROSTAT) 0.4 MG SL tablet, Place 1 tablet (0.4 mg total) under the tongue every 5 (five) minutes as needed for chest pain., Disp: 30 tablet, Rfl: 1 .  Omega-3 Fatty Acids (SALMON OIL-1000) 200 MG CAPS, Take by mouth., Disp: , Rfl:  .  Pitavastatin Calcium (LIVALO) 2 MG TABS, Take 1 tablet (2 mg total) by mouth at bedtime., Disp: 90 tablet, Rfl: 0 .  Semaglutide (RYBELSUS) 7 MG TABS, Take 1  tablet by mouth daily., Disp: 30 tablet, Rfl: 1  IMPRESSION:    ICD-10-CM   1. Mixed hyperlipidemia  E78.2 CT CARDIAC SCORING (SELF PAY ONLY)    Pitavastatin Calcium (LIVALO) 2 MG TABS  2. Dyspnea on exertion  R06.00   3. Essential hypertension  I10   4. OSA on CPAP  G47.33    Z99.89   5. Family history of heart disease  Z82.49   6. Pre-diabetes  R73.03   7. Hypertriglyceridemia  E78.1      RECOMMENDATIONS: Mixed hyperlipidemia:  Given his elevated 10-year risk of ASCVD patient was recommended to be on statin therapy in the past.  However, he has been intolerant to multiple statin therapy due to either muscle cramps, joint pain or muscle pain. He has tried Lipitor, pravastatin, Zetia.  Recommended a trial of Livalo, two week sample provided.   We will check a coronary calcium score for further risk stratification.  Continue to monitor.  Dyspnea on exertion:   Chronic and stable.  Has undergone ischemic evaluation as outlined above.  Has lost approximately 13 pounds since January 2022.   Continue to monitor, no additional testing indicated at this time.  Educated on the importance of improving his modifiable cardiovascular risk factors.    Patient has an upcoming appointment with his PCP and recommended possibly seeing weight loss management for additional guidance.    Benign essential hypertension:   Medications reconciled.  Office blood pressures within acceptable range.  Currently managed by primary care provider.  Low-salt diet recommended.  Orders Placed This Encounter  Procedures  . CT CARDIAC SCORING (SELF PAY ONLY)   --Continue cardiac medications as reconciled in final medication list. --Return in about 3 months (around 02/05/2021) for Follow up, Lipid. or sooner if needed. --Continue follow-up with your primary care physician regarding the management of your other chronic comorbid conditions.  Patient's questions and concerns were addressed to his  satisfaction. He voices  understanding of the instructions provided during this encounter.   This note was created using a voice recognition software as a result there may be grammatical errors inadvertently enclosed that do not reflect the nature of this encounter. Every attempt is made to correct such errors.  Time spent: 34 minutes.  Patrick Baxter, [place], [facility]  Pager: [phone] Office: [phone]

## 2020-11-06 ENCOUNTER — Ambulatory Visit: Payer: 59 | Admitting: Cardiology

## 2020-11-06 ENCOUNTER — Encounter: Payer: Self-pay | Admitting: Cardiology

## 2020-11-06 ENCOUNTER — Other Ambulatory Visit: Payer: Self-pay

## 2020-11-06 VITALS — BP 125/84 | HR 97 | Temp 98.0°F | Resp 16 | Ht 68.0 in | Wt >= 6400 oz

## 2020-11-06 DIAGNOSIS — R7303 Prediabetes: Secondary | ICD-10-CM

## 2020-11-06 DIAGNOSIS — I10 Essential (primary) hypertension: Secondary | ICD-10-CM

## 2020-11-06 DIAGNOSIS — Z9989 Dependence on other enabling machines and devices: Secondary | ICD-10-CM

## 2020-11-06 DIAGNOSIS — E781 Pure hyperglyceridemia: Secondary | ICD-10-CM

## 2020-11-06 DIAGNOSIS — Z8249 Family history of ischemic heart disease and other diseases of the circulatory system: Secondary | ICD-10-CM

## 2020-11-06 DIAGNOSIS — R0609 Other forms of dyspnea: Secondary | ICD-10-CM

## 2020-11-06 DIAGNOSIS — E782 Mixed hyperlipidemia: Secondary | ICD-10-CM

## 2020-11-06 DIAGNOSIS — G4733 Obstructive sleep apnea (adult) (pediatric): Secondary | ICD-10-CM

## 2020-11-06 DIAGNOSIS — R0600 Dyspnea, unspecified: Secondary | ICD-10-CM

## 2020-11-06 MED ORDER — LIVALO 2 MG PO TABS: 2.0000 mg | ORAL_TABLET | Freq: Every day | ORAL | 0 refills | Status: DC

## 2020-11-07 ENCOUNTER — Telehealth: Payer: Self-pay

## 2020-11-07 ENCOUNTER — Other Ambulatory Visit: Payer: Self-pay | Admitting: Medical

## 2020-11-07 NOTE — Telephone Encounter (Signed)
Please see the response sent to me by Cathlean Cower.

## 2020-11-07 NOTE — Telephone Encounter (Signed)
Prior Auth for Patrick Baxter has been denied, please advise.

## 2020-11-07 NOTE — Telephone Encounter (Signed)
From pt

## 2020-11-07 NOTE — Telephone Encounter (Signed)
Spoke to Delice Bison, please setup a peer to peer with his insurance company. Option two is doing calcium score to see if he has ASCVD.

## 2020-11-08 ENCOUNTER — Other Ambulatory Visit: Payer: Self-pay | Admitting: Medical

## 2020-11-08 ENCOUNTER — Other Ambulatory Visit: Payer: Self-pay

## 2020-11-08 MED ORDER — OZEMPIC (0.25 OR 0.5 MG/DOSE) 2 MG/1.5ML ~~LOC~~ SOPN: 0.5000 mg | PEN_INJECTOR | SUBCUTANEOUS | 1 refills | Status: DC

## 2020-11-10 ENCOUNTER — Telehealth: Payer: Self-pay

## 2020-11-10 NOTE — Telephone Encounter (Signed)
P.A. RYBELSUS  

## 2020-11-13 NOTE — Telephone Encounter (Signed)
P.A. denied only covered for Type 2 diabetes, will see if the Ozempic was covered.  Left message for pt

## 2020-11-15 ENCOUNTER — Other Ambulatory Visit: Payer: Self-pay | Admitting: Medical

## 2020-11-19 ENCOUNTER — Other Ambulatory Visit: Payer: Self-pay | Admitting: Cardiology

## 2020-11-19 DIAGNOSIS — I10 Essential (primary) hypertension: Secondary | ICD-10-CM

## 2020-11-20 ENCOUNTER — Other Ambulatory Visit: Payer: Self-pay | Admitting: Medical

## 2020-11-20 DIAGNOSIS — E038 Other specified hypothyroidism: Secondary | ICD-10-CM

## 2020-11-24 ENCOUNTER — Telehealth: Payer: Self-pay

## 2020-11-24 NOTE — Telephone Encounter (Signed)
P.A. OZEMPIC 

## 2020-11-30 NOTE — Telephone Encounter (Signed)
P.A. approved til 11/24/21

## 2020-12-13 NOTE — Telephone Encounter (Signed)
Called pt and he never picked up Ozempic, called [facility] and they show no record at either [facility].  Called into [facility].  Pt informed.

## 2021-01-29 ENCOUNTER — Ambulatory Visit: Payer: 59 | Admitting: Cardiology

## 2021-02-02 ENCOUNTER — Other Ambulatory Visit: Payer: Self-pay | Admitting: Medical

## 2021-02-02 DIAGNOSIS — E038 Other specified hypothyroidism: Secondary | ICD-10-CM

## 2021-02-06 ENCOUNTER — Other Ambulatory Visit: Payer: Self-pay | Admitting: Medical

## 2021-02-08 NOTE — Telephone Encounter (Signed)
Requesting refill on cyclobenzaprine pt. Last apt 10/22/20 and next apt 04/23/21.

## 2021-02-17 ENCOUNTER — Other Ambulatory Visit: Payer: Self-pay | Admitting: Medical

## 2021-03-08 ENCOUNTER — Other Ambulatory Visit: Payer: Self-pay | Admitting: Medical

## 2021-03-28 ENCOUNTER — Encounter: Payer: Self-pay | Admitting: Internal Medicine

## 2021-04-23 ENCOUNTER — Ambulatory Visit: Payer: 59 | Admitting: Medical

## 2021-04-23 ENCOUNTER — Other Ambulatory Visit: Payer: Self-pay

## 2021-04-23 VITALS — BP 122/72 | HR 89 | Wt >= 6400 oz

## 2021-04-23 DIAGNOSIS — Z636 Dependent relative needing care at home: Secondary | ICD-10-CM

## 2021-04-23 DIAGNOSIS — E1165 Type 2 diabetes mellitus with hyperglycemia: Secondary | ICD-10-CM

## 2021-04-23 DIAGNOSIS — I10 Essential (primary) hypertension: Secondary | ICD-10-CM

## 2021-04-23 DIAGNOSIS — Z91199 Patient's noncompliance with other medical treatment and regimen due to unspecified reason: Secondary | ICD-10-CM | POA: Insufficient documentation

## 2021-04-23 DIAGNOSIS — E039 Hypothyroidism, unspecified: Secondary | ICD-10-CM | POA: Diagnosis not present

## 2021-04-23 DIAGNOSIS — F419 Anxiety disorder, unspecified: Secondary | ICD-10-CM

## 2021-04-23 DIAGNOSIS — F32A Depression, unspecified: Secondary | ICD-10-CM

## 2021-04-23 DIAGNOSIS — Z23 Encounter for immunization: Secondary | ICD-10-CM | POA: Insufficient documentation

## 2021-04-23 NOTE — Progress Notes (Signed)
Subjective:  Patrick Baxter is a 50 y.o. male who presents for Chief Complaint  Patient presents with   med check    Med check, no concerns. Abnormal on pq-9. Flu shot given     Patrick Baxter is here for med check.  Here for recheck on mood, prozac.  Compliant with medication.  He feels like his life is on hold dealing with aging parent issues.  Not currently seeing a counselor.   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  He notes his father had a bad fall at home months ago, hospitalized, then rehab.  He decided to see his house and move to assisted living.  He continues to fall at the assisted living facility.   Patrick Baxter has been dealing with this father's issues.  Mother passed away in 2015-11-05.    He notes with all his parent issues , he never started Ozempic or Livalo.  HTN - compliant with medications for blood pressure.   No chest pain, no dyspnea, no swelling.    Diabetes-he did not start Ozempic after we recommended that months ago.  No polyuria, no polydipsia, no blurred vision, no weight change.  Hyperlipidemia-he never started Livalo per my and cardiology's recommendations months ago  No other aggravating or relieving factors.    No other c/o.  Past Medical History:  Diagnosis Date   Allergy    Anxiety    Back pain    Bilateral chronic knee pain    Chronic headaches    Depression    Dermatographism    Dry skin    Fatigue    Frequent urination    Gout    Hx of echocardiogram 09/07/2019   normal LV function, EF 60-65%, no wall motion abnormality; Dr. Tessa Lerner   Hyperlipidemia    Hypertension    Joint pain    Knee pain    Obesity    Pre-diabetes    Sleep apnea    Stress    Wears glasses    Current Outpatient Medications on File Prior to Visit  Medication Sig Dispense Refill   allopurinol (ZYLOPRIM) 300 MG tablet TAKE 1 TABLET(300 MG) BY MOUTH DAILY 90 tablet 3   B Complex-C (PROBEC-T  PO) Take by mouth.     Calcium-Magnesium-Zinc 167-83-8 MG TABS Take 1 tablet by mouth daily.     cholecalciferol (VITAMIN D3) 25 MCG (1000 UNIT) tablet Take 1 tablet (1,000 Units total) by mouth daily. 90 tablet 3   Coenzyme Q10 (CO Q 10 PO) Take by mouth.     cyclobenzaprine (FLEXERIL) 10 MG tablet TAKE 1/2 TO 1 TABLET BY MOUTH EVERY NIGHT AT BEDTIME AS NEEDED 30 tablet 3   FLUoxetine (PROZAC) 40 MG capsule TAKE 1 CAPSULE(40 MG) BY MOUTH DAILY 90 capsule 0   hydrochlorothiazide (HYDRODIURIL) 25 MG tablet TAKE 1 TABLET(25 MG) BY MOUTH IN THE MORNING 90 tablet 3   levothyroxine (SYNTHROID) 25 MCG tablet TAKE 1 TABLET(25 MCG) BY MOUTH DAILY BEFORE BREAKFAST 90 tablet 0   lisinopril (ZESTRIL) 20 MG tablet TAKE 1 TABLET(20 MG) BY MOUTH DAILY 30 tablet 6   metoprolol succinate (TOPROL-XL) 50 MG 24 hr tablet Take with or immediately following a meal. 90 tablet 3   montelukast (SINGULAIR) 10 MG tablet TAKE 1 TABLET(10 MG) BY MOUTH AT BEDTIME 90 tablet 1   Multiple Vitamins-Minerals (MULTIVITAMIN WITH MINERALS) tablet Take 1 tablet by mouth daily. 90 tablet 3  naproxen (NAPROSYN) 250 MG tablet Take by mouth 3 (three) times daily with meals.     Omega-3 Fatty Acids (SALMON OIL-1000) 200 MG CAPS Take by mouth.     nitroGLYCERIN (NITROSTAT) 0.4 MG SL tablet Place 1 tablet (0.4 mg total) under the tongue every 5 (five) minutes as needed for chest pain. 30 tablet 1   OZEMPIC, 0.25 OR 0.5 MG/DOSE, 2 MG/1.5ML SOPN INJECT 0.5 MG INTO SKIN ONCE A WEEK SUBCUTANEOUSLY (Patient not taking: Reported on 04/23/2021) 1.5 mL 1   Pitavastatin Calcium (LIVALO) 2 MG TABS Take 1 tablet (2 mg total) by mouth at bedtime. (Patient not taking: Reported on 04/23/2021) 90 tablet 0   No current facility-administered medications on file prior to visit.     The following portions of the patient's history were reviewed and updated as appropriate: allergies, current medications, past family history, past medical history, past social  history, past surgical history and problem list.  ROS Otherwise as in subjective above    Objective: BP 122/72   Pulse 89   Wt (!) 448 lb 6.4 oz (203.4 kg)   BMI 68.18 kg/m   Wt Readings from Last 3 Encounters:  04/23/21 (!) 448 lb 6.4 oz (203.4 kg)  11/06/20 (!) 443 lb (200.9 kg)  10/22/20 (!) 440 lb (199.6 kg)    General appearance: alert, no distress, well developed, well nourished Neck: supple, no lymphadenopathy, no thyromegaly, no masses Heart: RRR, normal S1, S2, no murmurs Lungs: CTA bilaterally, no wheezes, rhonchi, or rales Pulses: 2+ radial pulses, 2+ pedal pulses, normal cap refill Ext: no edema Psych: Pleasant, answers questions appropriately, no acute distress    Assessment: Encounter Diagnoses  Name Primary?   Anxiety and depression Yes   Hypothyroidism, unspecified type    Type 2 diabetes mellitus with hyperglycemia, without long-term current use of insulin (HCC)    Severe obesity (BMI >= 40) (HCC)    Essential hypertension    Needs flu shot    Noncompliance    Caregiver burden      Plan: Anxiety and depression-continue Prozac.  Strongly advise he seek counseling.  He is going to look in to employee assistance program through employer for counseling.   Severe obesity-I tried to motivate him again.  He has not been particularly aggressive about his health or motivated to do much in the years I have known him.  I strongly recommended we pursue bariatric surgery consult.  We had made a referral prior but he never actually went for an appointment.  We are going to try to make this happen  Hypertension-continue current medications  Hyperlipidemia-noncompliance.  Advised he start the Livalo that was prescribed earlier this year along with low-cholesterol diet  Diabetes-he had been prediabetic up until this year.  He is not compliant with therapy.  I strongly recommended he begin Ozempic.  Nurse did a demonstration today to show him how to use the pen.   We did an injection of 0.25 mg Ozempic today.  Discussed risk and benefits of medication and proper use of medication.  Continue efforts with low sugar diet and exercise.  Plan follow-up in 6-8 weeks  Counseled on the influenza virus vaccine.  Vaccine information sheet given.  Influenza vaccine given after consent obtained.  Hypothyroidism-updated labs today, continue current medication  Caregiver burden discussed seeing counseling, discussed making sure his father has healthcare advanced directives in place, will in place, discussed pursuing physical therapy consult for his father given the recent falls.  Advise he  speak with the assisted living facility on other resources that can give father some additional assistance  Patrick Baxter was seen today for med check.  Diagnoses and all orders for this visit:  Anxiety and depression  Hypothyroidism, unspecified type -     TSH + free T4  Type 2 diabetes mellitus with hyperglycemia, without long-term current use of insulin (HCC)  Severe obesity (BMI >= 40) (HCC) -     Amb Referral to Bariatric Surgery  Essential hypertension  Needs flu shot -     Flu Vaccine QUAD 33mo+IM (Fluarix, Fluzone & Alfiuria Quad PF)  Noncompliance  Caregiver burden   Follow up: pending labs

## 2021-04-24 ENCOUNTER — Other Ambulatory Visit: Payer: Self-pay | Admitting: Medical

## 2021-04-24 DIAGNOSIS — E038 Other specified hypothyroidism: Secondary | ICD-10-CM

## 2021-04-24 LAB — TSH+FREE T4
Free T4: 0.98 ng/dL (ref 0.82–1.77)
TSH: 3.68 u[IU]/mL (ref 0.450–4.500)

## 2021-04-24 MED ORDER — LEVOTHYROXINE SODIUM 25 MCG PO TABS
ORAL_TABLET | ORAL | 3 refills | Status: DC
Start: 2021-04-24 — End: 2022-03-10

## 2021-04-28 ENCOUNTER — Other Ambulatory Visit: Payer: Self-pay | Admitting: Medical

## 2021-05-07 ENCOUNTER — Telehealth: Payer: Self-pay | Admitting: Medical

## 2021-05-07 NOTE — Telephone Encounter (Signed)
See his email MyChart message  Ozempic and similar medicines actually usually help potentially reduce the risk of gout  Is he actually taking his allopurinol daily?  If he is taking this daily and we may need to consider other therapy options  Offer appointment for acute gout flareup treatment

## 2021-05-09 ENCOUNTER — Other Ambulatory Visit: Payer: Self-pay | Admitting: Medical

## 2021-05-09 MED ORDER — COLCHICINE 0.6 MG PO TABS: 0.6000 mg | ORAL_TABLET | Freq: Two times a day (BID) | ORAL | 0 refills | Status: DC

## 2021-05-13 ENCOUNTER — Telehealth: Payer: Self-pay | Admitting: Medical

## 2021-05-13 ENCOUNTER — Other Ambulatory Visit: Payer: Self-pay

## 2021-05-13 ENCOUNTER — Ambulatory Visit: Payer: 59 | Admitting: Medical

## 2021-05-13 VITALS — BP 110/70 | HR 91 | Temp 97.1°F | Wt >= 6400 oz

## 2021-05-13 DIAGNOSIS — E039 Hypothyroidism, unspecified: Secondary | ICD-10-CM

## 2021-05-13 DIAGNOSIS — I10 Essential (primary) hypertension: Secondary | ICD-10-CM | POA: Diagnosis not present

## 2021-05-13 DIAGNOSIS — M109 Gout, unspecified: Secondary | ICD-10-CM | POA: Diagnosis not present

## 2021-05-13 DIAGNOSIS — Z6841 Body Mass Index (BMI) 40.0 and over, adult: Secondary | ICD-10-CM | POA: Insufficient documentation

## 2021-05-13 DIAGNOSIS — M7989 Other specified soft tissue disorders: Secondary | ICD-10-CM | POA: Diagnosis not present

## 2021-05-13 MED ORDER — HYDROCODONE-ACETAMINOPHEN 5-325 MG PO TABS: 1.0000 | ORAL_TABLET | Freq: Four times a day (QID) | ORAL | 0 refills | Status: DC | PRN

## 2021-05-13 NOTE — Telephone Encounter (Signed)
Please schedule acute gout flare with one of [place] today

## 2021-05-13 NOTE — Progress Notes (Signed)
Subjective:  Patrick Baxter is a 50 y.o. male who presents for Chief Complaint  Patient presents with   Gout    Gout was really bad Saturday and Sunday. Ok today. Had some diarrhea when started colochine.      Here for gout concern.  He called in last week about flare up.   Last weekend about a week ago started with left great toe pain, swelling and redness.   No injury, no trauma, no fall.  He recently started ozempic, but has been compliant with Allopurinol 300mg .  He is compliant with HCTZ.   No fever, no other c/o.  No other aggravating or relieving factors.    No other c/o.  Past Medical History:  Diagnosis Date   Allergy    Anxiety    Back pain    Bilateral chronic knee pain    Chronic headaches    Depression    Dermatographism    Dry skin    Fatigue    Frequent urination    Gout    Hx of echocardiogram 09/07/2019   normal LV function, EF 60-65%, no wall motion abnormality; Dr. 09/09/2019   Hyperlipidemia    Hypertension    Joint pain    Knee pain    Obesity    Pre-diabetes    Sleep apnea    Stress    Wears glasses    Current Outpatient Medications on File Prior to Visit  Medication Sig Dispense Refill   allopurinol (ZYLOPRIM) 300 MG tablet TAKE 1 TABLET(300 MG) BY MOUTH DAILY 90 tablet 3   B Complex-C (PROBEC-T PO) Take by mouth.     Calcium-Magnesium-Zinc 167-83-8 MG TABS Take 1 tablet by mouth daily.     cholecalciferol (VITAMIN D3) 25 MCG (1000 UNIT) tablet Take 1 tablet (1,000 Units total) by mouth daily. 90 tablet 3   Coenzyme Q10 (CO Q 10 PO) Take by mouth.     colchicine 0.6 MG tablet Take 1 tablet (0.6 mg total) by mouth 2 (two) times daily. For flare up 60 tablet 0   cyclobenzaprine (FLEXERIL) 10 MG tablet TAKE 1/2 TO 1 TABLET BY MOUTH EVERY NIGHT AT BEDTIME AS NEEDED 30 tablet 3   FLUoxetine (PROZAC) 40 MG capsule TAKE 1 CAPSULE(40 MG) BY MOUTH DAILY 90 capsule 0   levothyroxine (SYNTHROID) 25 MCG tablet TAKE 1 TABLET(25 MCG) BY MOUTH DAILY BEFORE  BREAKFAST 90 tablet 3   lisinopril (ZESTRIL) 20 MG tablet TAKE 1 TABLET(20 MG) BY MOUTH DAILY 30 tablet 6   metoprolol succinate (TOPROL-XL) 50 MG 24 hr tablet Take with or immediately following a meal. 90 tablet 3   montelukast (SINGULAIR) 10 MG tablet TAKE 1 TABLET(10 MG) BY MOUTH AT BEDTIME 90 tablet 1   Multiple Vitamins-Minerals (MULTIVITAMIN WITH MINERALS) tablet Take 1 tablet by mouth daily. 90 tablet 3   naproxen (NAPROSYN) 250 MG tablet Take by mouth 3 (three) times daily with meals.     Omega-3 Fatty Acids (SALMON OIL-1000) 200 MG CAPS Take by mouth.     OZEMPIC, 0.25 OR 0.5 MG/DOSE, 2 MG/1.5ML SOPN INJECT 0.5 MG INTO SKIN ONCE A WEEK SUBCUTANEOUSLY 1.5 mL 1   nitroGLYCERIN (NITROSTAT) 0.4 MG SL tablet Place 1 tablet (0.4 mg total) under the tongue every 5 (five) minutes as needed for chest pain. 30 tablet 1   Pitavastatin Calcium (LIVALO) 2 MG TABS Take 1 tablet (2 mg total) by mouth at bedtime. (Patient not taking: Reported on 04/23/2021) 90 tablet 0   No  current facility-administered medications on file prior to visit.     The following portions of the patient's history were reviewed and updated as appropriate: allergies, current medications, past family history, past medical history, past social history, past surgical history and problem list.  ROS Otherwise as in subjective above  Objective: BP 110/70   Pulse 91   Temp (!) 97.1 F (36.2 C)   Wt (!) 435 lb 8 oz (197.5 kg)   BMI 66.22 kg/m   Wt Readings from Last 3 Encounters:  05/13/21 (!) 435 lb 8 oz (197.5 kg)  04/23/21 (!) 448 lb 6.4 oz (203.4 kg)  11/06/20 (!) 443 lb (200.9 kg)    General appearance: alert, no distress, well developed, well nourished Mild swelling left great toe, no swelling, mild pinkish coloration, rest of foot unremarkable Pulses: 2+ radial pulses, 2+ pedal pulses, normal cap refill Ext: no edema   Assessment: Encounter Diagnoses  Name Primary?   Acute gout involving toe of left foot,  unspecified cause Yes   Toe swelling    Essential hypertension    Hypothyroidism, unspecified type    BMI 60.0-69.9, adult (HCC)      Plan: Given recent gout flare up, continue Allopurinol 300mg  daily, use colchicine BID for another week.  Stop hydrochlorothiazide given risk of aggravating gout.   C/t ozempic at 0.25mg  weekly but in 2 weeks if no more gout issue, increase to ozempic 0.5mg  weekly.  F/u 1-2 mo, sooner prn.  Patrick Baxter was seen today for gout.  Diagnoses and all orders for this visit:  Acute gout involving toe of left foot, unspecified cause  Toe swelling  Essential hypertension  Hypothyroidism, unspecified type  BMI 60.0-69.9, adult (HCC)  Other orders -     HYDROcodone-acetaminophen (NORCO) 5-325 MG tablet; Take 1 tablet by mouth every 6 (six) hours as needed.   Follow up: 1-2 mo recheck on uric acid, BP, gout

## 2021-05-16 IMAGING — XA Imaging study
6 series · 6 of 6 positions shown · non-contrast
Comparison: none

CLINICAL DATA: Bilateral low back pain. No previous lumbar surgery.
Multilevel degenerative changes of the lumbar spine superimposed on
a congenitally small spinal canal, as described above, with
mild-to-moderate spinal canal stenosis at L3-4 and mild spinal canal
stenosis at L1-2, L2-3, L4-5 and L5-S1. Narrowing of the bilateral
subarticular zones at L4-5 and on the
right at L5-S1, mostly related to facet arthropathy. Bilateral facet
arthropathy L4-5, L5-S1.

EXAM:
RIGHT L4-5 FACET INJECTION UNDER FLUOROSCOPY
LEFT L4-5 FACET INJECTION UNDER FLUOROSCOPY
RIGHT L5-S1 FACET INJECTION UNDER FLUOROSCOPY
LEFT L5-S1 FACET INJECTION UNDER FLUOROSCOPY
FLUOROSCOPY TIME:  2 minutes 36 seconds; 192 mgy
TECHNIQUE: The procedure, risks (including but not limited to bleeding,
infection, organ damage ), benefits, and alternatives were explained
to the patient. Questions regarding the procedure were encouraged
and answered. The patient understands and consents to the procedure.
Appropriate skin entry sites determined fluoroscopically and marked.
Operator donned sterile gloves and mask. Sites prepped with
betadine, draped in usual sterile fashion, and infiltrated locally
with 1% lidocaine.

[Series 1: fl (-) angio · 1 of 1 slices shown (1 of 6)]
[im 1/1]
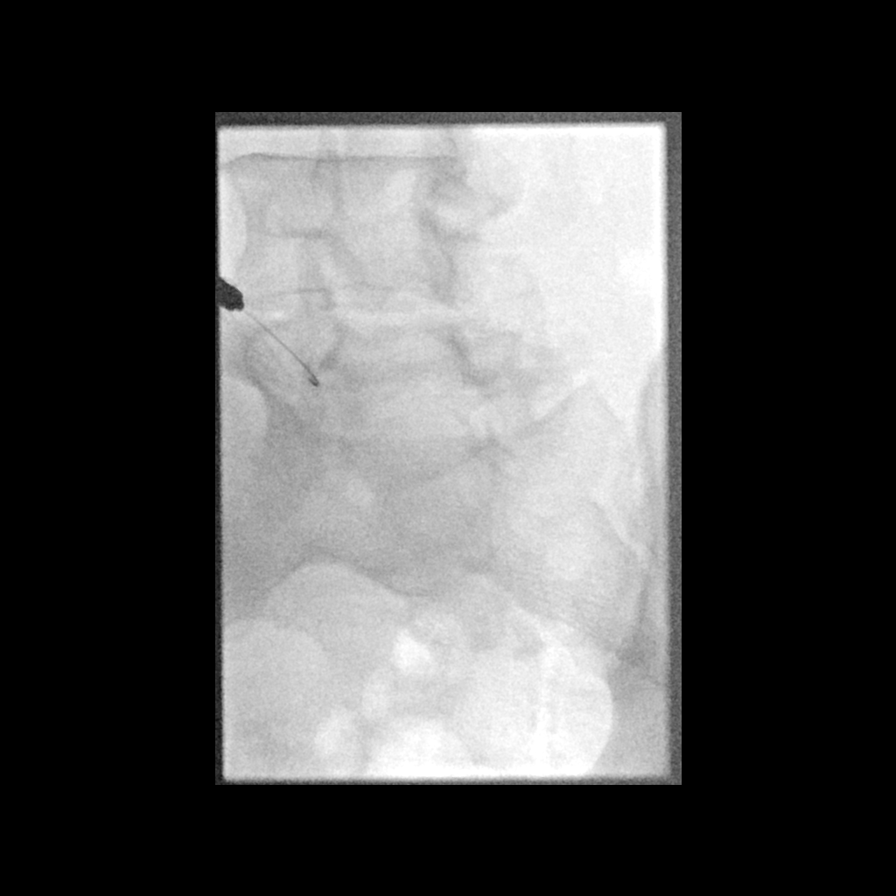

[Series 2: fl (-) angio · 1 of 1 slices shown (2 of 6)]
[im 1/1]
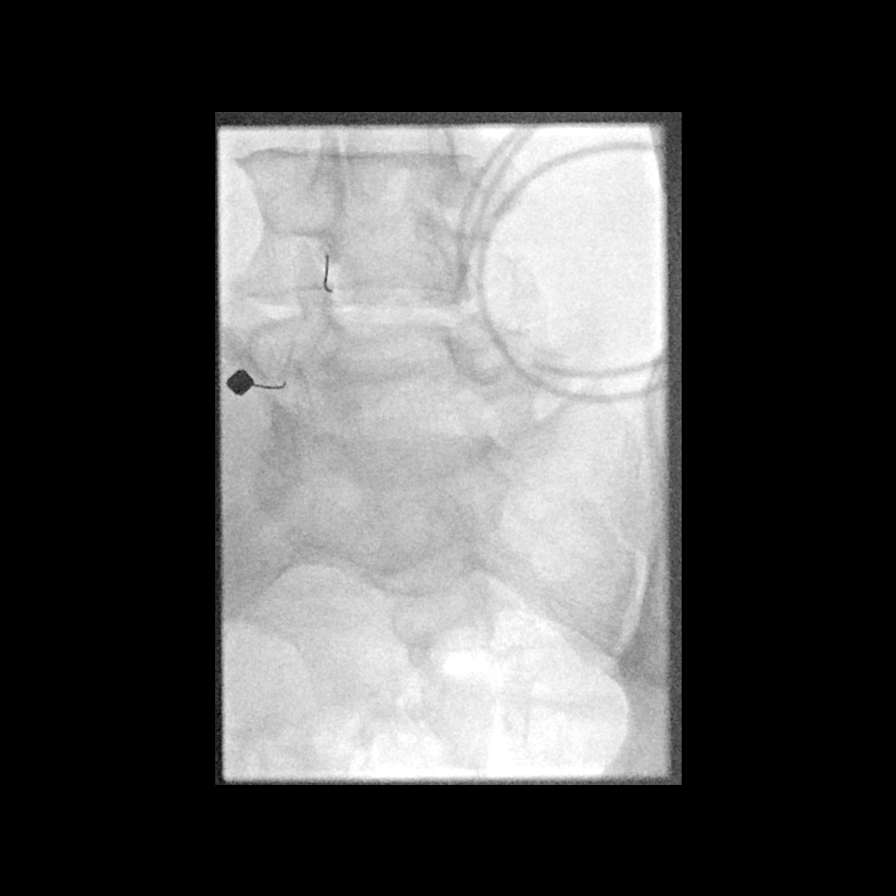

[Series 3: fl (-) angio · 1 of 1 slices shown (3 of 6)]
[im 1/1]
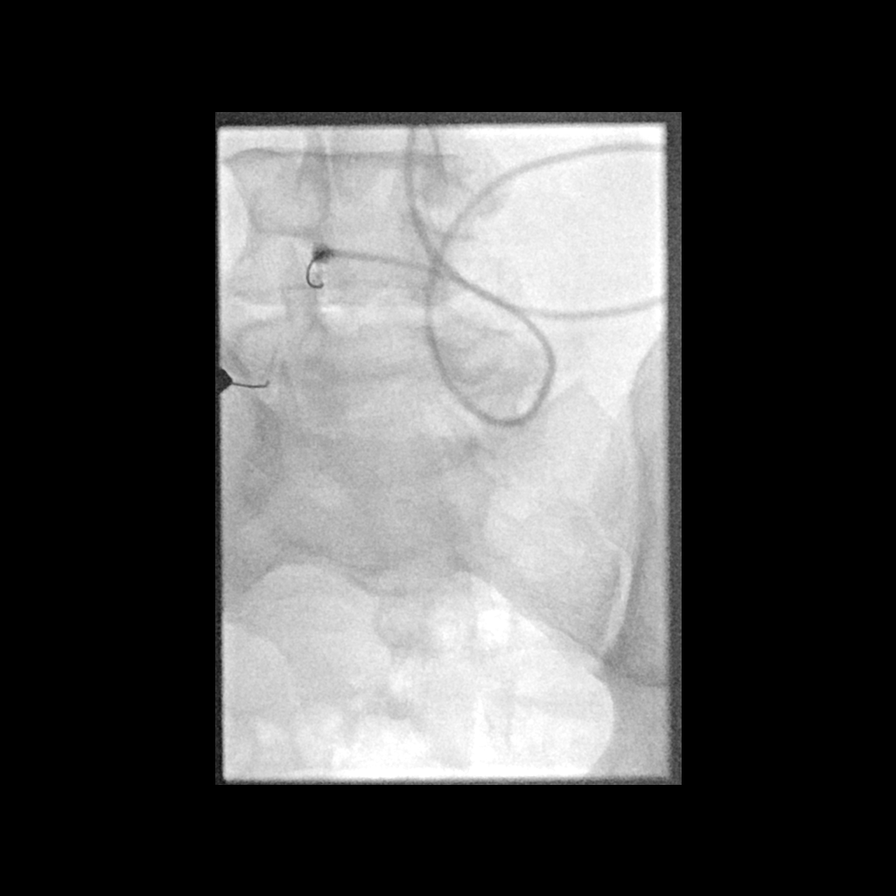

[Series 4: fl (-) angio · 1 of 1 slices shown (4 of 6)]
[im 1/1]
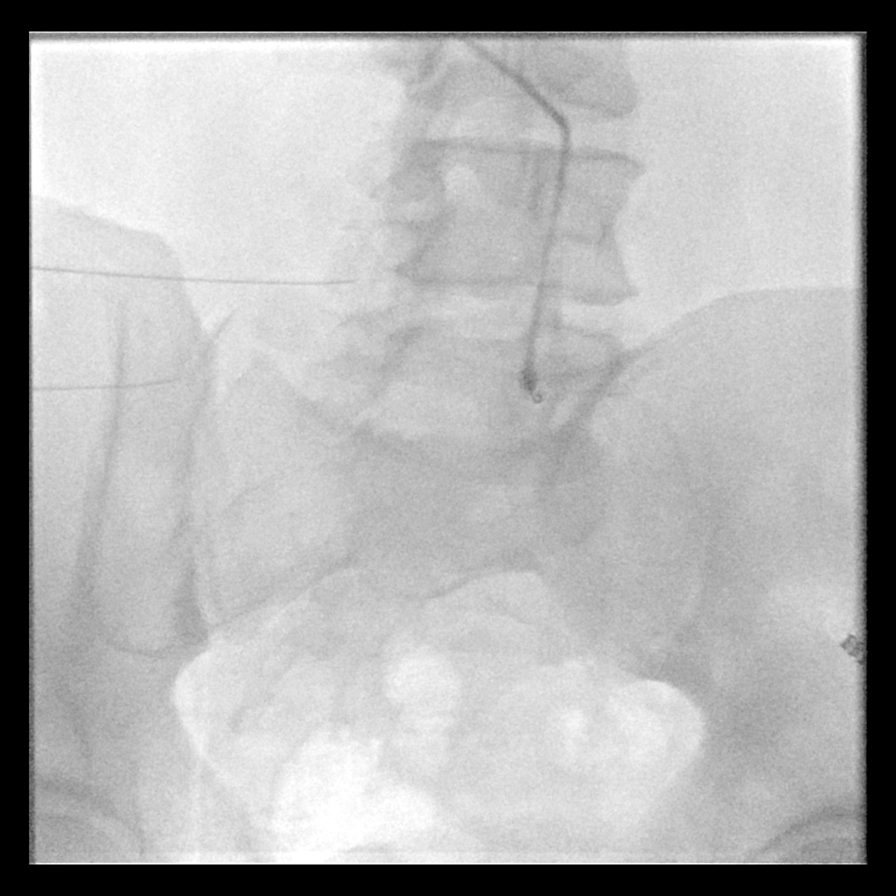

[Series 5: fl (-) angio · 1 of 1 slices shown (5 of 6)]
[im 1/1]
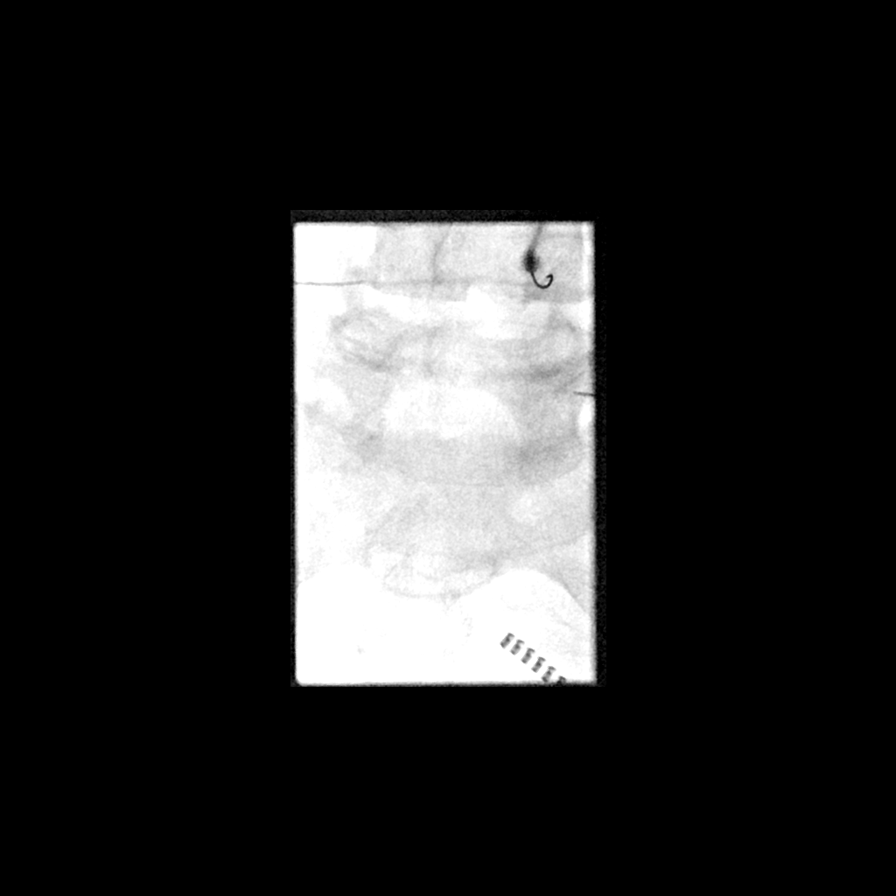

[Series 6: fl (-) angio · 1 of 1 slices shown (6 of 6)]
[im 1/1]
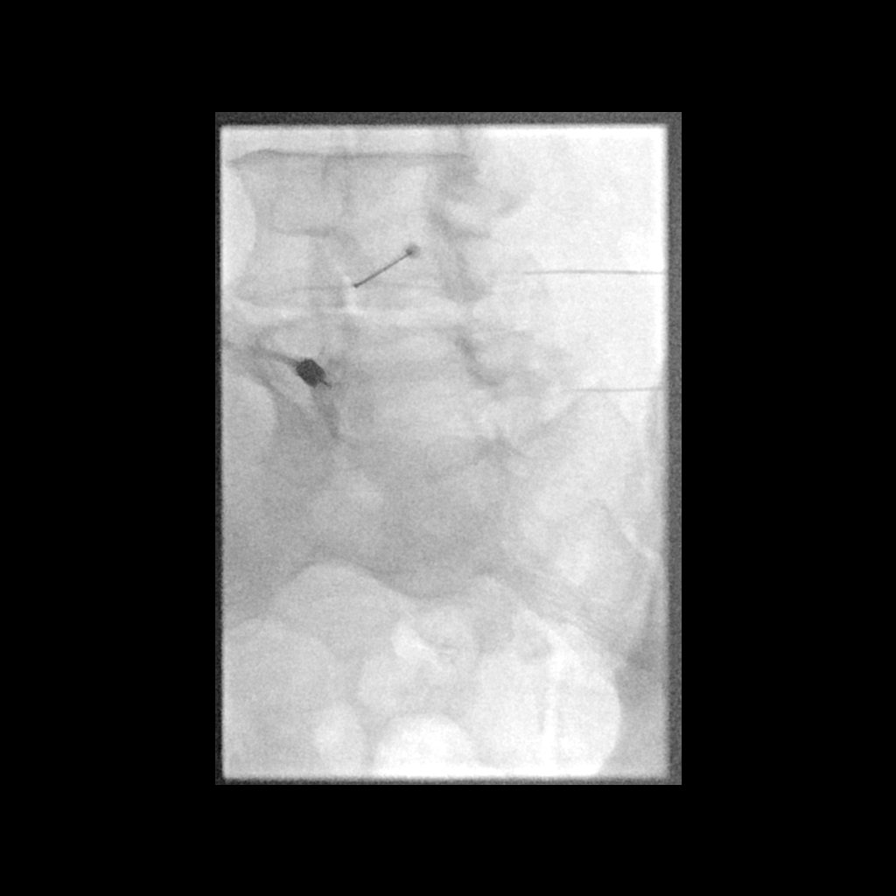

[6 of 6 positions shown; findings below may reference images not displayed]

A long 22 gauge spinal needle was advanced to the posterior margin
of the left L5-S1 facet under fluoroscopy. Diagnostic injection of
0.5 ml Isovue-M 200 confirmed no intrathecal or intravascular
component.

A long 22 gauge spinal needle was advanced to the posterior margin
of the left L4-5 facet under fluoroscopy. Diagnostic injection of
0.5 ml Isovue-M 200 confirmed intra-articular/juxta-articular spread
without any intrathecal or intravascular component.

A long 22 gauge spinal needle was advanced to the posterior margin
of the right L5-S1 facet under fluoroscopy. Diagnostic injection of
0.5 ml Isovue-M 200 confirmed intra-articular/juxta-articular spread
without any intrathecal or intravascular component.

A long 22 gauge spinal needle was advanced to the posterior margin
of the right L4-5 facet under fluoroscopy. Diagnostic injection of
0.5 ml Isovue-M 200 confirmed intra-articular/juxta-articular spread
without any intrathecal or intravascular component.

The left L5-S1 approach was modified under fluoroscopy. Diagnostic
injection of 0.5 ml Isovue-M 200 confirmed
intra-articular/juxta-articular spread without any intrathecal or
intravascular component.

Subsequently, 120 mg Depo-Medrol in 3 mL Sensorcaine 0.25% was
injected, divided evenly between the 4 sites. The patient tolerated
the procedure well.

COMPLICATIONS:
COMPLICATIONS
None
IMPRESSION: 1. Technically successful left L4-5 facet injection.
2. Technically successful right L4-5 facet injection.
3. Technically successful left L5-S1 facet injection.
4. Technically successful right L5-S1 facet injection.

## 2021-05-24 ENCOUNTER — Other Ambulatory Visit: Payer: Self-pay | Admitting: Medical

## 2021-07-09 ENCOUNTER — Other Ambulatory Visit: Payer: Self-pay

## 2021-07-09 ENCOUNTER — Encounter: Payer: Self-pay | Admitting: Medical

## 2021-07-09 ENCOUNTER — Telehealth (INDEPENDENT_AMBULATORY_CARE_PROVIDER_SITE_OTHER): Payer: 59 | Admitting: Medical

## 2021-07-09 VITALS — Wt >= 6400 oz

## 2021-07-09 DIAGNOSIS — J3489 Other specified disorders of nose and nasal sinuses: Secondary | ICD-10-CM | POA: Diagnosis not present

## 2021-07-09 DIAGNOSIS — J988 Other specified respiratory disorders: Secondary | ICD-10-CM | POA: Diagnosis not present

## 2021-07-09 MED ORDER — AZITHROMYCIN 250 MG PO TABS: ORAL_TABLET | ORAL | 0 refills | Status: DC

## 2021-07-09 NOTE — Progress Notes (Signed)
Subjective:     Patient ID: Patrick Baxter, male   DOB: [birth year], 50 y.o.   MRN: [id number]  This visit type was conducted due to national recommendations for restrictions regarding the COVID-19 Pandemic (e.g. social distancing) in an effort to limit this patient's exposure and mitigate transmission in our community.  Due to their co-morbid illnesses, this patient is at least at moderate risk for complications without adequate follow up.  This format is felt to be most appropriate for this patient at this time.    Documentation for virtual audio and video telecommunications through [facility] encounter:  The patient was located at home. The provider was located in the office. The patient did consent to this visit and is aware of possible charges through their insurance for this visit.  The other persons participating in this telemedicine service were none. Time spent on call was 20 minutes and in review of previous records 20 minutes total.  This virtual service is not related to other E/M service within previous 7 days.   HPI Chief Complaint  Patient presents with   Cough    Sore throat, sinus and chest congestion. 2 negative home Covid tests. Nasal drainage clear. S/S started last Tuesday. Does not have thermometer, has not felt feverish, denies body aches/ chills   He notes 1 week hx/o illness.  Had 2 negative covid tests at home.  He was around relatives that had sinus infection recently.   He reports 1 week history of sore throat, sinus and chest congestion, decreaesd energy, nasal drainage.  No body aches, chills, fever, no NVD.  No sob or wheezing.  Using OTC cough drops, tylenol.   No other aggravating or relieving factors. No other complaint.   Past Medical History:  Diagnosis Date   Allergy    Anxiety    Back pain    Bilateral chronic knee pain    Chronic headaches    Depression    Dermatographism    Dry skin    Fatigue    Frequent urination    Gout    Hx of  echocardiogram 09/07/2019   normal LV function, EF 60-65%, no wall motion abnormality; Dr. Tessa Lerner   Hyperlipidemia    Hypertension    Joint pain    Knee pain    Obesity    Pre-diabetes    Sleep apnea    Stress    Wears glasses    Current Outpatient Medications on File Prior to Visit  Medication Sig Dispense Refill   allopurinol (ZYLOPRIM) 300 MG tablet TAKE 1 TABLET(300 MG) BY MOUTH DAILY 90 tablet 3   B Complex-C (PROBEC-T PO) Take by mouth.     Calcium-Magnesium-Zinc 167-83-8 MG TABS Take 1 tablet by mouth daily.     cholecalciferol (VITAMIN D3) 25 MCG (1000 UNIT) tablet Take 1 tablet (1,000 Units total) by mouth daily. 90 tablet 3   Coenzyme Q10 (CO Q 10 PO) Take by mouth.     cyclobenzaprine (FLEXERIL) 10 MG tablet TAKE 1/2 TO 1 TABLET BY MOUTH EVERY NIGHT AT BEDTIME AS NEEDED 30 tablet 3   FLUoxetine (PROZAC) 40 MG capsule TAKE 1 CAPSULE(40 MG) BY MOUTH DAILY 90 capsule 1   HYDROcodone-acetaminophen (NORCO) 5-325 MG tablet Take 1 tablet by mouth every 6 (six) hours as needed. 12 tablet 0   levothyroxine (SYNTHROID) 25 MCG tablet TAKE 1 TABLET(25 MCG) BY MOUTH DAILY BEFORE BREAKFAST 90 tablet 3   lisinopril (ZESTRIL) 20 MG tablet TAKE 1 TABLET(20 MG) BY MOUTH  DAILY 30 tablet 6   metoprolol succinate (TOPROL-XL) 50 MG 24 hr tablet Take with or immediately following a meal. 90 tablet 3   montelukast (SINGULAIR) 10 MG tablet TAKE 1 TABLET(10 MG) BY MOUTH AT BEDTIME 90 tablet 1   Multiple Vitamins-Minerals (MULTIVITAMIN WITH MINERALS) tablet Take 1 tablet by mouth daily. 90 tablet 3   naproxen (NAPROSYN) 250 MG tablet Take by mouth 3 (three) times daily with meals.     Omega-3 Fatty Acids (SALMON OIL-1000) 200 MG CAPS Take by mouth.     OZEMPIC, 0.25 OR 0.5 MG/DOSE, 2 MG/1.5ML SOPN INJECT 0.5 MG INTO SKIN ONCE A WEEK SUBCUTANEOUSLY 1.5 mL 1   colchicine 0.6 MG tablet Take 1 tablet (0.6 mg total) by mouth 2 (two) times daily. For flare up (Patient not taking: Reported on 07/09/2021)  60 tablet 0   nitroGLYCERIN (NITROSTAT) 0.4 MG SL tablet Place 1 tablet (0.4 mg total) under the tongue every 5 (five) minutes as needed for chest pain. 30 tablet 1   Pitavastatin Calcium (LIVALO) 2 MG TABS Take 1 tablet (2 mg total) by mouth at bedtime. (Patient not taking: Reported on 04/23/2021) 90 tablet 0   No current facility-administered medications on file prior to visit.    Review of Systems As in subjective    Objective:   Physical Exam Due to coronavirus pandemic stay at home measures, patient visit was virtual and they were not examined in person.   Wt (!) 430 lb (195 kg)    BMI 65.38 kg/m   Gen: nad,mildly ill appearing No labored breathing or wheezing      Assessment:     Encounter Diagnoses  Name Primary?   Respiratory tract infection Yes   Sinus pressure        Plan:     Advise rest, hydration, continue supportive measures, begin antibiotic below.  If not much improved in the next 3 to 4 days or if new or worse symptoms then call back  [name] was seen today for cough.  Diagnoses and all orders for this visit:  Respiratory tract infection  Sinus pressure  Other orders -     azithromycin (ZITHROMAX) 250 MG tablet; 2 tablets day 1, then 1 tablet days 2-4  F/u prn

## 2021-07-17 ENCOUNTER — Ambulatory Visit: Payer: 59 | Admitting: Medical

## 2021-07-17 ENCOUNTER — Encounter: Payer: Self-pay | Admitting: Medical

## 2021-07-17 ENCOUNTER — Other Ambulatory Visit: Payer: Self-pay

## 2021-07-17 VITALS — BP 132/82 | HR 99 | Temp 98.4°F | Ht 68.0 in | Wt >= 6400 oz

## 2021-07-17 DIAGNOSIS — I10 Essential (primary) hypertension: Secondary | ICD-10-CM | POA: Diagnosis not present

## 2021-07-17 DIAGNOSIS — M1000 Idiopathic gout, unspecified site: Secondary | ICD-10-CM

## 2021-07-17 DIAGNOSIS — R059 Cough, unspecified: Secondary | ICD-10-CM

## 2021-07-17 DIAGNOSIS — G9339 Other post infection and related fatigue syndromes: Secondary | ICD-10-CM

## 2021-07-17 DIAGNOSIS — E1165 Type 2 diabetes mellitus with hyperglycemia: Secondary | ICD-10-CM | POA: Diagnosis not present

## 2021-07-17 MED ORDER — ALBUTEROL SULFATE HFA 108 (90 BASE) MCG/ACT IN AERS: 2.0000 | INHALATION_SPRAY | Freq: Four times a day (QID) | RESPIRATORY_TRACT | 0 refills | Status: DC | PRN

## 2021-07-17 MED ORDER — HYDROCOD POLST-CPM POLST ER 10-8 MG/5ML PO SUER: 5.0000 mL | Freq: Two times a day (BID) | ORAL | 0 refills | Status: DC

## 2021-07-17 NOTE — Progress Notes (Signed)
Subjective:     Patient ID: Patrick Baxter, male   DOB: July 21, 1970, 51 y.o.   MRN: [id number]  HPI Chief Complaint  Patient presents with   Sinus Problem    Sinus congestion and pressure follow up- Z-pack fixed majority of symptoms since then but since completing ABT has developed Patrick Baxter with exertion   Here for recheck.  I did a virtual consult 06/19/21 for illness.  He notes that the Z-Pak from that appointment did clear up a lot of his mucus but he still has cough, some winded feeling with exertion, no wheezing noted.  His main symptom is fatigue.  Cough is still somewhat frequent.  Still has some drainage.  No body aches or chills.  No fever.  No wheezing.  Nausea vomiting or diarrhea.  No leg swelling.  No chest pain.  It would like to recheck on uric acid as it was high the last time it was checked.  He is compliant with allopurinol.  He has not been taking Ozempic in the last several weeks.  Had a stressful holiday season with his dad having to relocate out of the nursing facility due to issues out of their control  No other complaint   Past Medical History:  Diagnosis Date   Allergy    Anxiety    Back pain    Bilateral chronic knee pain    Chronic headaches    Depression    Dermatographism    Dry skin    Fatigue    Frequent urination    Gout    Hx of echocardiogram 09/07/2019   normal LV function, EF 60-65%, no wall motion abnormality; Dr. Rex Baxter   Hyperlipidemia    Hypertension    Joint pain    Knee pain    Obesity    Pre-diabetes    Sleep apnea    Stress    Wears glasses    Current Outpatient Medications on File Prior to Visit  Medication Sig Dispense Refill   allopurinol (ZYLOPRIM) 300 MG tablet TAKE 1 TABLET(300 MG) BY MOUTH DAILY 90 tablet 3   B Complex-C (PROBEC-T PO) Take by mouth.     Calcium-Magnesium-Zinc 167-83-8 MG TABS Take 1 tablet by mouth daily.     cholecalciferol (VITAMIN D3) 25 MCG (1000 UNIT) tablet Take 1 tablet (1,000 Units total) by  mouth daily. 90 tablet 3   Coenzyme Q10 (CO Q 10 PO) Take by mouth.     cyclobenzaprine (FLEXERIL) 10 MG tablet TAKE 1/2 TO 1 TABLET BY MOUTH EVERY NIGHT AT BEDTIME AS NEEDED 30 tablet 3   FLUoxetine (PROZAC) 40 MG capsule TAKE 1 CAPSULE(40 MG) BY MOUTH DAILY 90 capsule 1   levothyroxine (SYNTHROID) 25 MCG tablet TAKE 1 TABLET(25 MCG) BY MOUTH DAILY BEFORE BREAKFAST 90 tablet 3   lisinopril (ZESTRIL) 20 MG tablet TAKE 1 TABLET(20 MG) BY MOUTH DAILY 30 tablet 6   metoprolol succinate (TOPROL-XL) 50 MG 24 hr tablet Take with or immediately following a meal. 90 tablet 3   montelukast (SINGULAIR) 10 MG tablet TAKE 1 TABLET(10 MG) BY MOUTH AT BEDTIME 90 tablet 1   Multiple Vitamins-Minerals (MULTIVITAMIN WITH MINERALS) tablet Take 1 tablet by mouth daily. 90 tablet 3   naproxen (NAPROSYN) 250 MG tablet Take by mouth 3 (three) times daily with meals.     Omega-3 Fatty Acids (SALMON OIL-1000) 200 MG CAPS Take by mouth.     OZEMPIC, 0.25 OR 0.5 MG/DOSE, 2 MG/1.5ML SOPN INJECT 0.5 MG INTO SKIN ONCE  A WEEK SUBCUTANEOUSLY 1.5 mL 1   azithromycin (ZITHROMAX) 250 MG tablet 2 tablets day 1, then 1 tablet days 2-4 (Patient not taking: Reported on 07/17/2021) 6 tablet 0   colchicine 0.6 MG tablet Take 1 tablet (0.6 mg total) by mouth 2 (two) times daily. For flare up (Patient not taking: Reported on 07/09/2021) 60 tablet 0   HYDROcodone-acetaminophen (NORCO) 5-325 MG tablet Take 1 tablet by mouth every 6 (six) hours as needed. (Patient not taking: Reported on 07/17/2021) 12 tablet 0   nitroGLYCERIN (NITROSTAT) 0.4 MG SL tablet Place 1 tablet (0.4 mg total) under the tongue every 5 (five) minutes as needed for chest pain. 30 tablet 1   Pitavastatin Calcium (LIVALO) 2 MG TABS Take 1 tablet (2 mg total) by mouth at bedtime. (Patient not taking: Reported on 04/23/2021) 90 tablet 0   No current facility-administered medications on file prior to visit.    Review of Systems As in subjective    Objective:   Physical  Exam BP 132/82 (BP Location: Right Arm, Patient Position: Sitting)    Pulse 99    Temp 98.4 F (36.9 C) (Oral)    Ht 5\' 8"  (1.727 m)    Wt (!) 437 lb 9.6 oz (198.5 kg)    SpO2 96%    BMI 66.54 kg/m   Wt Readings from Last 3 Encounters:  07/17/21 (!) 437 lb 9.6 oz (198.5 kg)  07/09/21 (!) 430 lb (195 kg)  05/13/21 (!) 435 lb 8 oz (197.5 kg)   General appearence: alert, no distress, WD/WN, Skin: Diaphoretic of scalp HEENT: normocephalic, sclerae anicteric, TMs pearly, nares patent, no discharge or erythema, pharynx normal Oral cavity: MMM, no lesions Neck: supple, no lymphadenopathy, no thyromegaly, no masses Heart: RRR, normal S1, S2, no murmurs Lungs: Somewhat decreased but no wheezes, rhonchi, or rales No leg swelling or calf tenderness Pulses: 2+ symmetric, upper and lower extremities, normal cap refill       Assessment:     Encounter Diagnoses  Name Primary?   Cough, unspecified type Yes   Other post infection and related fatigue syndromes    Type 2 diabetes mellitus with hyperglycemia, without long-term current use of insulin (HCC)    Essential hypertension    Idiopathic gout, unspecified chronicity, unspecified site        Plan:     We discussed ongoing fatigue and cough.  Discussed that his lungs are relatively clear and vital signs are reassuring  Begin cough syrup below.  Discussed risk of sedation.  Begin albuterol inhaler.  Work on good hydration and letting body recuperate.  Can use some over-the-counter vitamins in the short-term such as emergenC  Updated labs today for uric acid and diabetes marker  He has not been using Ozempic in the last several weeks unfortunately  Patrick Baxter was seen today for sinus problem.  Diagnoses and all orders for this visit:  Cough, unspecified type -     CBC with Differential/Platelet  Other post infection and related fatigue syndromes  Type 2 diabetes mellitus with hyperglycemia, without long-term current use of insulin  (HCC) -     Hemoglobin A1c  Essential hypertension  Idiopathic gout, unspecified chronicity, unspecified site -     Uric acid  Other orders -     chlorpheniramine-HYDROcodone (TUSSIONEX PENNKINETIC ER) 10-8 MG/5ML SUER; Take 5 mLs by mouth 2 (two) times daily. -     albuterol (VENTOLIN HFA) 108 (90 Base) MCG/ACT inhaler; Inhale 2 puffs into the lungs every 6 (  six) hours as needed for wheezing or shortness of breath.  F/u pending labs

## 2021-07-18 LAB — CBC WITH DIFFERENTIAL/PLATELET
Basophils Absolute: 0.1 10*3/uL (ref 0.0–0.2)
Basos: 1 %
EOS (ABSOLUTE): 0.2 10*3/uL (ref 0.0–0.4)
Eos: 2 %
Hematocrit: 41.8 % (ref 37.5–51.0)
Hemoglobin: 14.6 g/dL (ref 13.0–17.7)
Immature Grans (Abs): 0.1 10*3/uL (ref 0.0–0.1)
Immature Granulocytes: 1 %
Lymphocytes Absolute: 1.7 10*3/uL (ref 0.7–3.1)
Lymphs: 21 %
MCH: 31.7 pg (ref 26.6–33.0)
MCHC: 34.9 g/dL (ref 31.5–35.7)
MCV: 91 fL (ref 79–97)
Monocytes Absolute: 0.7 10*3/uL (ref 0.1–0.9)
Monocytes: 9 %
Neutrophils Absolute: 5.6 10*3/uL (ref 1.4–7.0)
Neutrophils: 66 %
Platelets: 180 10*3/uL (ref 150–450)
RBC: 4.6 x10E6/uL (ref 4.14–5.80)
RDW: 12.5 % (ref 11.6–15.4)
WBC: 8.4 10*3/uL (ref 3.4–10.8)

## 2021-07-18 LAB — HEMOGLOBIN A1C
Est. average glucose Bld gHb Est-mCnc: 171 mg/dL
Hgb A1c MFr Bld: 7.6 % — ABNORMAL HIGH (ref 4.8–5.6)

## 2021-07-18 LAB — URIC ACID: Uric Acid: 5.3 mg/dL (ref 3.8–8.4)

## 2021-07-20 ENCOUNTER — Other Ambulatory Visit: Payer: Self-pay | Admitting: Cardiology

## 2021-07-20 DIAGNOSIS — I10 Essential (primary) hypertension: Secondary | ICD-10-CM

## 2021-08-26 ENCOUNTER — Ambulatory Visit: Payer: 59 | Admitting: Medical

## 2021-09-08 ENCOUNTER — Other Ambulatory Visit: Payer: Self-pay | Admitting: Medical

## 2021-09-14 ENCOUNTER — Other Ambulatory Visit: Payer: Self-pay | Admitting: Cardiology

## 2021-09-14 DIAGNOSIS — I10 Essential (primary) hypertension: Secondary | ICD-10-CM

## 2021-09-14 DIAGNOSIS — R0609 Other forms of dyspnea: Secondary | ICD-10-CM

## 2021-10-06 ENCOUNTER — Other Ambulatory Visit: Payer: Self-pay | Admitting: Medical

## 2021-10-16 ENCOUNTER — Other Ambulatory Visit: Payer: Self-pay | Admitting: Medical

## 2021-10-28 ENCOUNTER — Telehealth: Payer: Self-pay | Admitting: Internal Medicine

## 2021-10-28 NOTE — Telephone Encounter (Signed)
P.A ozempic done through cover my meds ?

## 2021-10-28 NOTE — Telephone Encounter (Signed)
Approved from 10/28/21 through 10/29/2022. I will notify patient and pharmacy  ?

## 2021-11-05 ENCOUNTER — Encounter: Payer: 59 | Admitting: Medical

## 2021-11-09 ENCOUNTER — Other Ambulatory Visit: Payer: Self-pay | Admitting: Medical

## 2021-11-11 ENCOUNTER — Other Ambulatory Visit: Payer: Self-pay | Admitting: Medical

## 2021-11-11 NOTE — Telephone Encounter (Signed)
Med was filled in November 2022 for 6 months. Pt has upcoming appt ?

## 2021-11-26 ENCOUNTER — Ambulatory Visit: Payer: 59 | Admitting: Medical

## 2021-11-26 VITALS — BP 130/80 | HR 78 | Ht 68.0 in | Wt >= 6400 oz

## 2021-11-26 DIAGNOSIS — Z125 Encounter for screening for malignant neoplasm of prostate: Secondary | ICD-10-CM

## 2021-11-26 DIAGNOSIS — M545 Low back pain, unspecified: Secondary | ICD-10-CM

## 2021-11-26 DIAGNOSIS — Z1211 Encounter for screening for malignant neoplasm of colon: Secondary | ICD-10-CM

## 2021-11-26 DIAGNOSIS — E559 Vitamin D deficiency, unspecified: Secondary | ICD-10-CM

## 2021-11-26 DIAGNOSIS — Z7185 Encounter for immunization safety counseling: Secondary | ICD-10-CM | POA: Diagnosis not present

## 2021-11-26 DIAGNOSIS — Z0000 Encounter for general adult medical examination without abnormal findings: Secondary | ICD-10-CM

## 2021-11-26 DIAGNOSIS — Z23 Encounter for immunization: Secondary | ICD-10-CM | POA: Diagnosis not present

## 2021-11-26 DIAGNOSIS — M1000 Idiopathic gout, unspecified site: Secondary | ICD-10-CM

## 2021-11-26 DIAGNOSIS — E039 Hypothyroidism, unspecified: Secondary | ICD-10-CM

## 2021-11-26 DIAGNOSIS — Z113 Encounter for screening for infections with a predominantly sexual mode of transmission: Secondary | ICD-10-CM

## 2021-11-26 DIAGNOSIS — F419 Anxiety disorder, unspecified: Secondary | ICD-10-CM

## 2021-11-26 DIAGNOSIS — Z6841 Body Mass Index (BMI) 40.0 and over, adult: Secondary | ICD-10-CM

## 2021-11-26 DIAGNOSIS — E1165 Type 2 diabetes mellitus with hyperglycemia: Secondary | ICD-10-CM | POA: Diagnosis not present

## 2021-11-26 DIAGNOSIS — M5136 Other intervertebral disc degeneration, lumbar region: Secondary | ICD-10-CM

## 2021-11-26 DIAGNOSIS — Z8249 Family history of ischemic heart disease and other diseases of the circulatory system: Secondary | ICD-10-CM

## 2021-11-26 DIAGNOSIS — I10 Essential (primary) hypertension: Secondary | ICD-10-CM

## 2021-11-26 DIAGNOSIS — J301 Allergic rhinitis due to pollen: Secondary | ICD-10-CM

## 2021-11-26 DIAGNOSIS — F32A Depression, unspecified: Secondary | ICD-10-CM

## 2021-11-26 DIAGNOSIS — G8929 Other chronic pain: Secondary | ICD-10-CM

## 2021-11-26 DIAGNOSIS — M4316 Spondylolisthesis, lumbar region: Secondary | ICD-10-CM

## 2021-11-26 DIAGNOSIS — E538 Deficiency of other specified B group vitamins: Secondary | ICD-10-CM

## 2021-11-26 DIAGNOSIS — G4733 Obstructive sleep apnea (adult) (pediatric): Secondary | ICD-10-CM

## 2021-11-26 MED ORDER — LISINOPRIL 20 MG PO TABS: ORAL_TABLET | ORAL | 1 refills | Status: DC

## 2021-11-26 MED ORDER — COLCHICINE 0.6 MG PO TABS: 0.6000 mg | ORAL_TABLET | Freq: Two times a day (BID) | ORAL | 3 refills | Status: DC

## 2021-11-26 MED ORDER — FLUOXETINE HCL 40 MG PO CAPS: ORAL_CAPSULE | ORAL | 1 refills | Status: DC

## 2021-11-26 MED ORDER — OZEMPIC (0.25 OR 0.5 MG/DOSE) 2 MG/1.5ML ~~LOC~~ SOPN: 0.5000 mg | PEN_INJECTOR | SUBCUTANEOUS | 2 refills | Status: DC

## 2021-11-26 MED ORDER — VITAMIN D 25 MCG (1000 UNIT) PO TABS: 1000.0000 [IU] | ORAL_TABLET | Freq: Every day | ORAL | 3 refills | Status: DC

## 2021-11-26 MED ORDER — CYCLOBENZAPRINE HCL 10 MG PO TABS: ORAL_TABLET | ORAL | 3 refills | Status: DC

## 2021-11-26 NOTE — Assessment & Plan Note (Signed)
Continue CPAP therapy.  The most important thing is to lose weight.  I recommend you contact your home health supplier about fitting of the mask and other things you can do to make sure your machine is working properly ?

## 2021-11-26 NOTE — Assessment & Plan Note (Signed)
I do recommend you check your sugars.  I recommend you check your feet daily for sores or wounds.  I recommend follow-up at least twice yearly on blood sugar labs.  We will increase the Ozempic dose today.  See your eye doctor yearly for routine diabetic eye exam. ?

## 2021-11-26 NOTE — Assessment & Plan Note (Signed)
Continue current medications.  Consider checking her blood pressures periodically.  Goal is less than 130/80 ?

## 2021-11-26 NOTE — Assessment & Plan Note (Signed)
I did not check this lab today.  I recommend you continue vitamin D supplement since you have been low several times prior ?

## 2021-11-26 NOTE — Progress Notes (Signed)
?Subjective:  ? ?HPI ? Patrick Baxter is a 51 y.o. male who presents for ?Chief Complaint  ?Patient presents with  ? fasting cpe  ?  Fasting cpe, having some gout flare in right foot   ? ? ?Patient Care Team: ?Kenitha Glendinning, Cleda Mccreedyavid S, PA-C as PCP - General (Family Medicine) ?Sees dentist ?Sees eye doctor ?Dr. Tessa LernerSunit Tolia, cardiology ?[facility] ?[facility] ? ? ?Concerns: ?Only recent concerns is gout flareup in his right great toe.  Has had some pain over the last week or so some swelling but it is improving.  He did start colchicine a few days ago.  He does endorse taking allopurinol daily ? ?History of sleep apnea-uses CPAP nightly.  Sometimes he does not think he gets a good seal with the mask. ? ?Diabetes-he reports compliance with Ozempic 0.25 mg daily.  He does not have a glucometer.  He may want to consider getting a glucometer.  No polyuria, no polydipsia.  No blurred vision. ? ?Hypothyroidism-compliant with medication ? ?Hypertension-compliant with medication. ? ?His father had a fall yesterday.  His father is 4590 in an assisted living facility.  His father is considering relocating here [place] closer to [place].  He still drives some but [name] does not think this is a good idea probably. ? ?He never completed the Cologuard test. ? ?He called and found out insurance does cover Shingrix vaccine ? ?He does report fatigue in general despite being on CPAP, despite eating a good variety of nutrients ? ? ?Reviewed their medical, surgical, family, social, medication, and allergy history and updated chart as appropriate. ? ?Past Medical History:  ?Diagnosis Date  ? Allergy   ? Anxiety   ? Back pain   ? Bilateral chronic knee pain   ? Chronic headaches   ? Depression   ? Dermatographism   ? Dry skin   ? Fatigue   ? Frequent urination   ? Gout   ? Hx of echocardiogram 09/07/2019  ? normal LV function, EF 60-65%, no wall motion abnormality; Dr. Tessa Lernerolia Sunit  ? Hyperlipidemia   ? Hypertension   ? Joint pain   ? Knee pain    ? Obesity   ? Pre-diabetes   ? Sleep apnea   ? Stress   ? Wears glasses   ? ? ?Past Surgical History:  ?Procedure Laterality Date  ? IR INJECT/THERA/INC NEEDLE/CATH/PLC EPI/LUMB/SAC W/IMG  09/14/2020  ? IR INJECT/THERA/INC NEEDLE/CATH/PLC EPI/LUMB/SAC W/IMG  09/14/2020  ? KNEE SURGERY  11/2006  ? right knee arthroscopy and meniscal repair, Dr. Conni ElliotLaw  ? ? ?Family History  ?Problem Relation Age of Onset  ? Alzheimer's disease Mother   ? Heart disease Mother   ?     4550, angioplasty, CAD  ? Cancer Mother   ?     breast  ? Stroke Mother   ? Hypertension Mother   ? Hyperlipidemia Mother   ? Heart disease Father   ?     angina  ? Arthritis Father   ? Cancer Maternal Grandmother   ?     leukemia  ? Diabetes Paternal Grandmother   ? ? ? ?Current Outpatient Medications:  ?  albuterol (VENTOLIN HFA) 108 (90 Base) MCG/ACT inhaler, Inhale 2 puffs into the lungs every 6 (six) hours as needed for wheezing or shortness of breath., Disp: 8 g, Rfl: 0 ?  allopurinol (ZYLOPRIM) 300 MG tablet, TAKE 1 TABLET(300 MG) BY MOUTH DAILY, Disp: 90 tablet, Rfl: 0 ?  B Complex-C (PROBEC-T PO),  Take by mouth., Disp: , Rfl:  ?  Calcium-Magnesium-Zinc 167-83-8 MG TABS, Take 1 tablet by mouth daily., Disp: , Rfl:  ?  Coenzyme Q10 (CO Q 10 PO), Take by mouth., Disp: , Rfl:  ?  levothyroxine (SYNTHROID) 25 MCG tablet, TAKE 1 TABLET(25 MCG) BY MOUTH DAILY BEFORE BREAKFAST, Disp: 90 tablet, Rfl: 3 ?  metoprolol succinate (TOPROL-XL) 50 MG 24 hr tablet, TAKE 1 TABLET BY MOUTH DAILY WITH OR IMMEDIATELY FOLLOWING A MEAL, Disp: 90 tablet, Rfl: 0 ?  montelukast (SINGULAIR) 10 MG tablet, TAKE 1 TABLET(10 MG) BY MOUTH AT BEDTIME, Disp: 90 tablet, Rfl: 1 ?  Multiple Vitamins-Minerals (MULTIVITAMIN WITH MINERALS) tablet, Take 1 tablet by mouth daily., Disp: 90 tablet, Rfl: 3 ?  naproxen (NAPROSYN) 250 MG tablet, Take by mouth 3 (three) times daily with meals., Disp: , Rfl:  ?  Omega-3 Fatty Acids (SALMON OIL-1000) 200 MG CAPS, Take by mouth., Disp: , Rfl:  ?   Semaglutide,0.25 or 0.5MG /DOS, (OZEMPIC, 0.25 OR 0.5 MG/DOSE,) 2 MG/1.5ML SOPN, Inject 0.5 mg into the skin once a week., Disp: 1.5 mL, Rfl: 2 ?  cholecalciferol (VITAMIN D3) 25 MCG (1000 UNIT) tablet, Take 1 tablet (1,000 Units total) by mouth daily., Disp: 90 tablet, Rfl: 3 ?  colchicine 0.6 MG tablet, Take 1 tablet (0.6 mg total) by mouth 2 (two) times daily. For flare up, Disp: 60 tablet, Rfl: 3 ?  cyclobenzaprine (FLEXERIL) 10 MG tablet, TAKE 1/2 TO 1 TABLET BY MOUTH EVERY NIGHT AT BEDTIME AS NEEDED, Disp: 30 tablet, Rfl: 3 ?  FLUoxetine (PROZAC) 40 MG capsule, TAKE 1 CAPSULE(40 MG) BY MOUTH DAILY, Disp: 90 capsule, Rfl: 1 ?  lisinopril (ZESTRIL) 20 MG tablet, TAKE 1 TABLET(20 MG) BY MOUTH DAILY, Disp: 90 tablet, Rfl: 1 ?  nitroGLYCERIN (NITROSTAT) 0.4 MG SL tablet, Place 1 tablet (0.4 mg total) under the tongue every 5 (five) minutes as needed for chest pain., Disp: 30 tablet, Rfl: 1 ? ?Allergies  ?Allergen Reactions  ? Atorvastatin Other (See Comments)  ?  myalgia  ? Pravastatin Sodium Other (See Comments)  ?  myalgia  ? ? ?Review of Systems  ?Constitutional:  Positive for malaise/fatigue. Negative for chills, fever and weight loss.  ?HENT:  Negative for congestion, ear pain, hearing loss, sore throat and tinnitus.   ?Eyes:  Negative for blurred vision, pain and redness.  ?Respiratory:  Negative for cough, hemoptysis and shortness of breath.   ?Cardiovascular:  Negative for chest pain, palpitations, orthopnea, claudication and leg swelling.  ?Gastrointestinal:  Negative for abdominal pain, blood in stool, constipation, diarrhea, nausea and vomiting.  ?Genitourinary:  Negative for dysuria, flank pain, frequency, hematuria and urgency.  ?Musculoskeletal:  Positive for joint pain. Negative for falls and myalgias.  ?Skin:  Negative for itching and rash.  ?Neurological:  Negative for dizziness, tingling, speech change, weakness and headaches.  ?Endo/Heme/Allergies:  Negative for polydipsia. Does not bruise/bleed  easily.  ?Psychiatric/Behavioral:  Negative for depression and memory loss. The patient is not nervous/anxious and does not have insomnia.   ? ? ? ?  11/26/2021  ?  9:01 AM 04/23/2021  ?  8:35 AM 10/22/2020  ?  8:34 AM 08/03/2019  ?  2:27 PM 11/17/2017  ?  1:19 PM  ?Depression screen PHQ 2/9  ?Decreased Interest 0 3 0 0 0  ?Down, Depressed, Hopeless 0 3 0 0 1  ?PHQ - 2 Score 0 6 0 0 1  ?Altered sleeping  0   0  ?Tired, decreased energy  2   0  ?Change in appetite  3   1  ?Feeling bad or failure about yourself   1   1  ?Trouble concentrating  1   0  ?Moving slowly or fidgety/restless  0   0  ?Suicidal thoughts  0   0  ?PHQ-9 Score  13   3  ?Difficult doing work/chores  Not difficult at all   Somewhat difficult  ? ? ?   ? ?Objective:  ?BP 130/80   Pulse 78   Ht 5\' 8"  (1.727 m)   Wt (!) 433 lb 9.6 oz (196.7 kg)   BMI 65.93 kg/m?  ? ?General appearance: alert, no distress, WD/WN, Caucasian male ?Skin: Somewhat diaphoretic as his usual, no new worrisome lesions, skin tags circumferentially around the neck ?HEENT: normocephalic, conjunctiva/corneas normal, sclerae anicteric, PERRLA, EOMi ?Neck: supple, no lymphadenopathy, no thyromegaly, no masses, normal ROM, no bruits ?Chest: non tender, normal shape and expansion ?Heart: RRR, normal S1, S2, no murmurs ?Lungs: CTA bilaterally, no wheezes, rhonchi, or rales ?Abdomen: +bs, soft, non tender, non distended, no masses, no hepatomegaly, no splenomegaly, no bruits ?Back: non tender, normal ROM, no scoliosis ?Musculoskeletal: upper extremities non tender, no obvious deformity, normal ROM throughout, lower extremities non tender, no obvious deformity, normal ROM throughout ?Extremities: no edema, no cyanosis, no clubbing ?Pulses: 2+ symmetric, upper and lower extremities, normal cap refill ?Neurological: alert, oriented x 3, CN2-12 intact, strength normal upper extremities and lower extremities, sensation normal throughout, DTRs 2+ throughout, no cerebellar signs, gait  normal ?Psychiatric: normal affect, behavior normal, pleasant  ?GU/rectal -deferred ? ?Diabetic Foot Exam - Simple   ?Simple Foot Form ?Diabetic Foot exam was performed with the following findings: Yes 11/26/2021  9:33 AM  ?Vi

## 2021-11-26 NOTE — Assessment & Plan Note (Signed)
I put a new referral in today for Cologuard.  Please complete this testing soon after you get the kit in the mail ?

## 2021-11-26 NOTE — Assessment & Plan Note (Signed)
Updated labs today, continue current therapy.  Make sure you take this medicine first thing in the morning approximately 45 minutes before breakfast or other medications. ?

## 2021-11-26 NOTE — Assessment & Plan Note (Signed)
The most important thing he can do for your health is to work on significant weight loss.  I am increasing your Ozempic dose today which should help.  We had placed a referral to bariatric surgery in the past year.  I recommend you schedule appointment with them soon to work on efforts to lose weight ?

## 2021-11-26 NOTE — Assessment & Plan Note (Signed)
Updated screening labs today.  I recommend you use condoms always ?

## 2021-11-26 NOTE — Assessment & Plan Note (Signed)
Updated lab screening on this today ?

## 2021-11-27 ENCOUNTER — Other Ambulatory Visit: Payer: Self-pay | Admitting: Medical

## 2021-11-27 MED ORDER — ALLOPURINOL 300 MG PO TABS: 300.0000 mg | ORAL_TABLET | Freq: Every day | ORAL | 3 refills | Status: DC

## 2021-11-28 ENCOUNTER — Telehealth: Payer: Self-pay | Admitting: Medical

## 2021-11-28 LAB — COMPREHENSIVE METABOLIC PANEL
ALT: 37 IU/L (ref 0–44)
AST: 35 IU/L (ref 0–40)
Albumin/Globulin Ratio: 1.2 (ref 1.2–2.2)
Albumin: 4 g/dL (ref 3.8–4.9)
Alkaline Phosphatase: 80 IU/L (ref 44–121)
BUN/Creatinine Ratio: 20 (ref 9–20)
BUN: 16 mg/dL (ref 6–24)
Bilirubin Total: 0.6 mg/dL (ref 0.0–1.2)
CO2: 24 mmol/L (ref 20–29)
Calcium: 8.8 mg/dL (ref 8.7–10.2)
Chloride: 100 mmol/L (ref 96–106)
Creatinine, Ser: 0.81 mg/dL (ref 0.76–1.27)
Globulin, Total: 3.4 g/dL (ref 1.5–4.5)
Glucose: 138 mg/dL — ABNORMAL HIGH (ref 70–99)
Potassium: 4.2 mmol/L (ref 3.5–5.2)
Sodium: 138 mmol/L (ref 134–144)
Total Protein: 7.4 g/dL (ref 6.0–8.5)
eGFR: 106 mL/min/{1.73_m2} (ref >59)

## 2021-11-28 LAB — RPR+HIV+GC+CT PANEL
Chlamydia trachomatis, NAA: NEGATIVE
HIV Screen 4th Generation wRfx: NONREACTIVE
Neisseria Gonorrhoeae by PCR: NEGATIVE
RPR Ser Ql: NONREACTIVE

## 2021-11-28 LAB — PSA: Prostate Specific Ag, Serum: 0.2 ng/mL (ref 0.0–4.0)

## 2021-11-28 LAB — CBC WITH DIFFERENTIAL/PLATELET
Basophils Absolute: 0.1 10*3/uL (ref 0.0–0.2)
Basos: 1 %
EOS (ABSOLUTE): 0.2 10*3/uL (ref 0.0–0.4)
Eos: 3 %
Hematocrit: 41 % (ref 37.5–51.0)
Hemoglobin: 14.2 g/dL (ref 13.0–17.7)
Immature Grans (Abs): 0 10*3/uL (ref 0.0–0.1)
Immature Granulocytes: 1 %
Lymphocytes Absolute: 1.7 10*3/uL (ref 0.7–3.1)
Lymphs: 27 %
MCH: 32.1 pg (ref 26.6–33.0)
MCHC: 34.6 g/dL (ref 31.5–35.7)
MCV: 93 fL (ref 79–97)
Monocytes Absolute: 0.5 10*3/uL (ref 0.1–0.9)
Monocytes: 8 %
Neutrophils Absolute: 3.9 10*3/uL (ref 1.4–7.0)
Neutrophils: 60 %
Platelets: 155 10*3/uL (ref 150–450)
RBC: 4.42 x10E6/uL (ref 4.14–5.80)
RDW: 12.9 % (ref 11.6–15.4)
WBC: 6.3 10*3/uL (ref 3.4–10.8)

## 2021-11-28 LAB — URIC ACID: Uric Acid: 6.5 mg/dL (ref 3.8–8.4)

## 2021-11-28 LAB — LIPID PANEL
Chol/HDL Ratio: 5 ratio (ref 0.0–5.0)
Cholesterol, Total: 160 mg/dL (ref 100–199)
HDL: 32 mg/dL — ABNORMAL LOW (ref >39)
LDL Chol Calc (NIH): 102 mg/dL — ABNORMAL HIGH (ref 0–99)
Triglycerides: 147 mg/dL (ref 0–149)
VLDL Cholesterol Cal: 26 mg/dL (ref 5–40)

## 2021-11-28 LAB — MICROALBUMIN / CREATININE URINE RATIO
Creatinine, Urine: 202.6 mg/dL
Microalb/Creat Ratio: 3 mg/g creat (ref 0–29)
Microalbumin, Urine: 6 ug/mL

## 2021-11-28 LAB — VITAMIN B12: Vitamin B-12: 545 pg/mL (ref 232–1245)

## 2021-11-28 LAB — TSH+FREE T4
Free T4: 1.16 ng/dL (ref 0.82–1.77)
TSH: 3.25 u[IU]/mL (ref 0.450–4.500)

## 2021-11-28 LAB — HEMOGLOBIN A1C
Est. average glucose Bld gHb Est-mCnc: 148 mg/dL
Hgb A1c MFr Bld: 6.8 % — ABNORMAL HIGH (ref 4.8–5.6)

## 2021-11-28 NOTE — Telephone Encounter (Signed)
Left message for pt call. Per shane pt needs an appt.

## 2022-01-09 ENCOUNTER — Other Ambulatory Visit: Payer: Self-pay | Admitting: Medical

## 2022-01-09 NOTE — Telephone Encounter (Signed)
30 more days was sent in to cover pt until his appt in sept. KH

## 2022-01-11 ENCOUNTER — Other Ambulatory Visit: Payer: Self-pay | Admitting: Medical

## 2022-03-07 ENCOUNTER — Other Ambulatory Visit: Payer: Self-pay | Admitting: Cardiology

## 2022-03-07 DIAGNOSIS — I10 Essential (primary) hypertension: Secondary | ICD-10-CM

## 2022-03-08 ENCOUNTER — Other Ambulatory Visit: Payer: Self-pay | Admitting: Medical

## 2022-03-08 DIAGNOSIS — E038 Other specified hypothyroidism: Secondary | ICD-10-CM

## 2022-03-19 ENCOUNTER — Encounter: Payer: Self-pay | Admitting: Internal Medicine

## 2022-04-01 ENCOUNTER — Ambulatory Visit: Payer: 59 | Admitting: Medical

## 2022-04-01 VITALS — BP 138/88 | HR 72 | Wt >= 6400 oz

## 2022-04-01 DIAGNOSIS — M542 Cervicalgia: Secondary | ICD-10-CM

## 2022-04-01 DIAGNOSIS — R110 Nausea: Secondary | ICD-10-CM

## 2022-04-01 DIAGNOSIS — G4733 Obstructive sleep apnea (adult) (pediatric): Secondary | ICD-10-CM

## 2022-04-01 DIAGNOSIS — F32A Depression, unspecified: Secondary | ICD-10-CM

## 2022-04-01 DIAGNOSIS — Z23 Encounter for immunization: Secondary | ICD-10-CM

## 2022-04-01 DIAGNOSIS — F419 Anxiety disorder, unspecified: Secondary | ICD-10-CM

## 2022-04-01 DIAGNOSIS — E1165 Type 2 diabetes mellitus with hyperglycemia: Secondary | ICD-10-CM | POA: Diagnosis not present

## 2022-04-01 DIAGNOSIS — I10 Essential (primary) hypertension: Secondary | ICD-10-CM

## 2022-04-01 DIAGNOSIS — E039 Hypothyroidism, unspecified: Secondary | ICD-10-CM

## 2022-04-01 DIAGNOSIS — Z6841 Body Mass Index (BMI) 40.0 and over, adult: Secondary | ICD-10-CM

## 2022-04-01 LAB — POCT GLYCOSYLATED HEMOGLOBIN (HGB A1C): Hemoglobin A1C: 6.5 % — AB (ref 4.0–5.6)

## 2022-04-01 MED ORDER — RYBELSUS 7 MG PO TABS: 7.0000 mg | ORAL_TABLET | Freq: Every day | ORAL | 0 refills | Status: DC

## 2022-04-01 MED ORDER — RYBELSUS 3 MG PO TABS: 3.0000 mg | ORAL_TABLET | Freq: Every day | ORAL | 0 refills | Status: DC

## 2022-04-01 NOTE — Progress Notes (Signed)
Subjective:  Patrick Baxter is a 51 y.o. male who presents for Chief Complaint  Patient presents with   3 month follow-up    3 month follow-up. Having some pressure in the sides of neck. Comes and goes, needs to schedule eye exam. Would like flu shot today     Patient Care Team: Marquavis Hannen, Leward Quan as PCP - General (Family Medicine) Sees dentist Sees eye doctor Dr. Rex Kras, cardiology [facility]    Concerns: Here for med check.   Diabetes - was using Ozempic, but didn't toelrate due to side effects.  Was having nausea, diarrhea.   Even being off ozempic for a while, continued to have nausea and diarrhea.   He has the fre style libre ,but hasn't been using this.  Having some pain or pressure in sides of neck, hot flashes at times.  This is intermittent, sometimes happens when walking.  No other related symptoms  He has had a stressful 3 months with getting his father established here locally with care.  He has heard from the bariatric clinic but hasn't returned their call or followed up with them.  Not currently seeing a counselor, has done this in the past  Has been stressed.  In last 3 months he has gotten his father into local assisted living facility, whereas [place].  Not sleeping well.  Waking up a few times per night.  Uses CPAP nightly.  No other aggravating or relieving factors.    No other c/o.  Past Medical History:  Diagnosis Date   Allergy    Anxiety    Back pain    Bilateral chronic knee pain    Chronic headaches    Depression    Dermatographism    Dry skin    Fatigue    Frequent urination    Gout    Hx of echocardiogram 09/07/2019   normal LV function, EF 60-65%, no wall motion abnormality; Dr. Rex Kras   Hyperlipidemia    Hypertension    Joint pain    Knee pain    Obesity    Pre-diabetes    Sleep apnea    Stress    Wears glasses    Current Outpatient Medications on File Prior to Visit  Medication  Sig Dispense Refill   albuterol (VENTOLIN HFA) 108 (90 Base) MCG/ACT inhaler Inhale 2 puffs into the lungs every 6 (six) hours as needed for wheezing or shortness of breath. 8 g 0   allopurinol (ZYLOPRIM) 300 MG tablet Take 1 tablet (300 mg total) by mouth daily. 90 tablet 3   B Complex-C (PROBEC-T PO) Take by mouth.     Calcium-Magnesium-Zinc 167-83-8 MG TABS Take 1 tablet by mouth daily.     cholecalciferol (VITAMIN D3) 25 MCG (1000 UNIT) tablet Take 1 tablet (1,000 Units total) by mouth daily. 90 tablet 3   Coenzyme Q10 (CO Q 10 PO) Take by mouth.     colchicine 0.6 MG tablet Take 1 tablet (0.6 mg total) by mouth 2 (two) times daily. For flare up 60 tablet 3   FLUoxetine (PROZAC) 40 MG capsule TAKE 1 CAPSULE(40 MG) BY MOUTH DAILY 90 capsule 1   levothyroxine (SYNTHROID) 25 MCG tablet TAKE 1 TABLET(25 MCG) BY MOUTH DAILY BEFORE AND BREAKFAST 90 tablet 1   lisinopril (ZESTRIL) 20 MG tablet TAKE 1 TABLET(20 MG) BY MOUTH DAILY 90 tablet 1   metoprolol succinate (TOPROL-XL) 50 MG 24 hr tablet TAKE 1 TABLET BY MOUTH  DAILY WITH OR IMMEDIATELY FOLLOWING A MEAL 90 tablet 0   montelukast (SINGULAIR) 10 MG tablet TAKE 1 TABLET(10 MG) BY MOUTH AT BEDTIME 90 tablet 1   Multiple Vitamins-Minerals (MULTIVITAMIN WITH MINERALS) tablet Take 1 tablet by mouth daily. 90 tablet 3   naproxen (NAPROSYN) 250 MG tablet Take by mouth 3 (three) times daily with meals.     Omega-3 Fatty Acids (SALMON OIL-1000) 200 MG CAPS Take by mouth.     cyclobenzaprine (FLEXERIL) 10 MG tablet TAKE 1/2 TO 1 TABLET BY MOUTH EVERY NIGHT AT BEDTIME AS NEEDED (Patient not taking: Reported on 04/01/2022) 30 tablet 3   nitroGLYCERIN (NITROSTAT) 0.4 MG SL tablet Place 1 tablet (0.4 mg total) under the tongue every 5 (five) minutes as needed for chest pain. 30 tablet 1   No current facility-administered medications on file prior to visit.     The following portions of the patient's history were reviewed and updated as appropriate:  allergies, current medications, past family history, past medical history, past social history, past surgical history and problem list.  ROS Otherwise as in subjective above    Objective: BP 138/88   Pulse 72   Wt (!) 448 lb (203.2 kg)   BMI 68.12 kg/m   Wt Readings from Last 3 Encounters:  04/01/22 (!) 448 lb (203.2 kg)  11/26/21 (!) 433 lb 9.6 oz (196.7 kg)  07/17/21 (!) 437 lb 9.6 oz (198.5 kg)   BP Readings from Last 3 Encounters:  04/01/22 138/88  11/26/21 130/80  07/17/21 132/82    General appearance: alert, no distress, well developed, well nourished HEENT: normocephalic, sclerae anicteric, conjunctiva pink and moist, TMs pearly, nares patent, no discharge or erythema, pharynx normal Oral cavity: MMM, no lesions Neck: supple, no lymphadenopathy, no thyromegaly, no masses Heart: RRR, normal S1, S2, no murmurs Lungs: CTA bilaterally, no wheezes, rhonchi, or rales Pulses: 2+ radial pulses, 2+ pedal pulses, normal cap refill Ext: no edema   Assessment: Encounter Diagnoses  Name Primary?   Type 2 diabetes mellitus with hyperglycemia, without long-term current use of insulin (HCC) Yes   Needs flu shot    OSA (obstructive sleep apnea)    Hypothyroidism, unspecified type    Essential hypertension    Anxiety and depression    BMI 60.0-69.9, adult (HCC)    Neck discomfort    Nausea      Plan: We discussed his medical history.  He continues to have apathy.  I strongly recommend counseling.  I strongly recommended he at least have an appointment with the bariatric clinic to discuss options and how they may help as well as the counseling they provide.  He badly needs to lose weight.  He continues to gain weight.  He did not really give a whole lot of effort with the Ozempic.  We will try Rybelsus instead to help with diabetes and weight loss.  We discussed risk and benefits and proper use of medication.  Continue CPAP.  Continue other medications as usual  I also  discussed his neck discomfort which is unclear cause.  Discussed stretching, discussed deal with anxiety and better sleep.   I reviewed his labs from last visit, physical exam May 2023.  No other labs today  Counseled on the influenza virus vaccine.  Vaccine information sheet given.  Influenza vaccine given after consent obtained.   Slayter was seen today for 3 month follow-up.  Diagnoses and all orders for this visit:  Type 2 diabetes mellitus with hyperglycemia, without long-term  current use of insulin (HCC) -     HgB A1c  Needs flu shot -     Flu Vaccine QUAD 29mo+IM (Fluarix, Fluzone & Alfiuria Quad PF)  OSA (obstructive sleep apnea)  Hypothyroidism, unspecified type  Essential hypertension  Anxiety and depression  BMI 60.0-69.9, adult (HCC)  Neck discomfort  Nausea  Other orders -     Semaglutide (RYBELSUS) 3 MG TABS; Take 3 mg by mouth daily. -     Semaglutide (RYBELSUS) 7 MG TABS; Take 7 mg by mouth daily.    Follow up: 76mo

## 2022-04-01 NOTE — Patient Instructions (Signed)
Lets stop the Ozempic and change to Rybelsus tablet once daily 3 mg daily for the next 2 to 3 weeks then change to the higher dose of 7 milligrams daily.  I strongly recommend you call back the bariatric clinic to get an appointment.  I think this can be very helpful  I also recommend you let us refer you to counseling.  I really think it would be helpful to do some counseling to help deal with the stress and help you find some ways to get motivated about losing weight about having some goals in life in general

## 2022-04-02 ENCOUNTER — Other Ambulatory Visit: Payer: Self-pay | Admitting: Medical

## 2022-05-19 ENCOUNTER — Other Ambulatory Visit: Payer: Self-pay | Admitting: Medical

## 2022-05-19 ENCOUNTER — Ambulatory Visit: Payer: 59 | Admitting: Medical

## 2022-05-19 VITALS — BP 120/78 | HR 86 | Wt >= 6400 oz

## 2022-05-19 DIAGNOSIS — H6123 Impacted cerumen, bilateral: Secondary | ICD-10-CM

## 2022-05-19 DIAGNOSIS — H9193 Unspecified hearing loss, bilateral: Secondary | ICD-10-CM | POA: Diagnosis not present

## 2022-05-19 NOTE — Progress Notes (Signed)
Subjective:  Patrick Baxter is a 51 y.o. male who presents for Chief Complaint  Patient presents with   left ear stopped up    Left ear stopped since staurday- get stopped up more at night     Here for decreased hearing, possible wax buildup.   Feels it in both ears.  No URI symptoms. Been using peroxide in ears at home the last few days  No other aggravating or relieving factors.    No other c/o.  The following portions of the patient's history were reviewed and updated as appropriate: allergies, current medications, past family history, past medical history, past social history, past surgical history and problem list.  ROS Otherwise as in subjective above  Objective: BP 120/78   Pulse 86   Wt (!) 442 lb 9.6 oz (200.8 kg)   BMI 67.30 kg/m   General appearance: alert, no distress, well developed, well nourished HEENT: normocephalic, sclerae anicteric, conjunctiva pink and moist, TMs pearly, nares patent, no discharge or erythema, pharynx normal   Assessment: Encounter Diagnoses  Name Primary?   Decreased hearing of both ears Yes   Impacted cerumen of both ears      Plan: Discussed findings.  Discussed risk/benefits of procedure and patient agrees to procedure. Successfully used warm water lavage to remove impacted cerumen from right ear canal. Got a moderate amount out of left TM, but not full clearing.  Patient tolerated procedure well. Advised they avoid using any cotton swabs or other devices to clean the ear canals.  Use basic hygiene as discussed.    Patient Instructions  Don't mess with the ears today or tomorrow.  By Wednesday you can use either over the counter debrox drops or home remedy as below.    For ear wax, you can use a  1:1 mixture of rubbing alcohol and white vinegar (half white vinegar, half rubbing alcohol).  Put a few drops in each ear 2-3 times a week for the next 2 weeks to help dissolve some of the wax.  If this doesn't seem successful, return for  recheck.   [name] was seen today for left ear stopped up.  Diagnoses and all orders for this visit:  Decreased hearing of both ears  Impacted cerumen of both ears    Follow up: prn

## 2022-05-19 NOTE — Patient Instructions (Signed)
Don't mess with the ears today or tomorrow.  By Wednesday you can use either over the counter debrox drops or home remedy as below.    For ear wax, you can use a  1:1 mixture of rubbing alcohol and white vinegar (half white vinegar, half rubbing alcohol).  Put a few drops in each ear 2-3 times a week for the next 2 weeks to help dissolve some of the wax.  If this doesn't seem successful, return for recheck.

## 2022-06-09 ENCOUNTER — Other Ambulatory Visit: Payer: Self-pay | Admitting: Medical

## 2022-06-23 ENCOUNTER — Other Ambulatory Visit: Payer: Self-pay | Admitting: Medical

## 2022-06-23 DIAGNOSIS — I10 Essential (primary) hypertension: Secondary | ICD-10-CM

## 2022-06-26 ENCOUNTER — Other Ambulatory Visit: Payer: Self-pay | Admitting: Medical

## 2022-07-05 ENCOUNTER — Other Ambulatory Visit: Payer: Self-pay | Admitting: Medical

## 2022-07-05 DIAGNOSIS — E038 Other specified hypothyroidism: Secondary | ICD-10-CM

## 2022-07-11 ENCOUNTER — Telehealth (INDEPENDENT_AMBULATORY_CARE_PROVIDER_SITE_OTHER): Payer: 59 | Admitting: Nurse Practitioner

## 2022-07-11 ENCOUNTER — Encounter: Payer: Self-pay | Admitting: Nurse Practitioner

## 2022-07-11 VITALS — Ht 68.0 in | Wt >= 6400 oz

## 2022-07-11 DIAGNOSIS — J0110 Acute frontal sinusitis, unspecified: Secondary | ICD-10-CM | POA: Diagnosis not present

## 2022-07-11 DIAGNOSIS — J111 Influenza due to unidentified influenza virus with other respiratory manifestations: Secondary | ICD-10-CM

## 2022-07-11 MED ORDER — OSELTAMIVIR PHOSPHATE 75 MG PO CAPS: 75.0000 mg | ORAL_CAPSULE | Freq: Two times a day (BID) | ORAL | 0 refills | Status: DC

## 2022-07-11 MED ORDER — BENZONATATE 200 MG PO CAPS: 200.0000 mg | ORAL_CAPSULE | Freq: Three times a day (TID) | ORAL | 0 refills | Status: DC | PRN

## 2022-07-11 MED ORDER — AMOXICILLIN-POT CLAVULANATE 875-125 MG PO TABS: 1.0000 | ORAL_TABLET | Freq: Two times a day (BID) | ORAL | 0 refills | Status: DC

## 2022-07-11 NOTE — Progress Notes (Incomplete)
Virtual Visit Encounter mychart visit.   I connected with  Patrick Baxter on 07/11/22 at  4:15 PM EST by secure {Video Enabled:26378::"video and audio"} telemedicine application. I verified that I am speaking with the correct person using two identifiers.   I introduced myself as a Publishing rights manager with the practice. The limitations of evaluation and management by telemedicine discussed with the patient and the availability of in person appointments. The patient expressed verbal understanding and consent to proceed.  Participating parties in this visit include: Myself and {Relatives of adult:5061}  The patient is: {Patient Location:[phone]::"Home"} I am: {Provider [phone]::"Home Office"} Subjective:    CC and HPI: Patrick Baxter is a 51 y.o. year old male presenting for {New/followup:15353} ***. Patient reports the following:  Past medical history, Surgical history, Family history not pertinant except as noted below, Social history, Allergies, and medications have been entered into the medical record, reviewed, and corrections made.   Review of Systems:  All review of systems negative except what is listed in the HPI  Objective:    Alert and oriented x *** Speaking in clear sentences with no shortness of breath. No distress.  Impression and Recommendations:    Problem List Items Addressed This Visit   None   {disease follow up plans:315751} I discussed the assessment and treatment plan with the patient. The patient was provided an opportunity to ask questions and all were answered. The patient agreed with the plan and demonstrated an understanding of the instructions.   The patient was advised to call back or seek an in-person evaluation if the symptoms worsen or if the condition fails to improve as anticipated.  Follow-Up: {plan; follow-up:11812}  I provided *** minutes of non-face-to-face interaction with this non face-to-face encounter including intake,  same-day documentation, and chart review.   Tollie Eth, NP , DNP, AGNP-c [facility] at [facility] [phone] (fax)

## 2022-07-16 DIAGNOSIS — J111 Influenza due to unidentified influenza virus with other respiratory manifestations: Secondary | ICD-10-CM | POA: Insufficient documentation

## 2022-07-16 NOTE — Assessment & Plan Note (Signed)
Patient positive for influenza-like illness that started earlier this week. He is within the window for tamiflu, but that will end today. Rather than take time for testing, I feel that presumptive treatment is appropriate with antiviral medication and close monitoring. We are going into the long holiday weekend and there is a possibility of secondary bacterial infection, therefore, I will send in augmentin for him to have in the event he is still having symptoms on Sunday or Monday. Supportive care guidance provided. Recommend f/u if no improvement by next week.

## 2022-08-29 DIAGNOSIS — E1165 Type 2 diabetes mellitus with hyperglycemia: Secondary | ICD-10-CM

## 2022-09-03 ENCOUNTER — Other Ambulatory Visit: Payer: Self-pay | Admitting: Medical

## 2022-09-17 ENCOUNTER — Other Ambulatory Visit: Payer: Self-pay | Admitting: Medical

## 2022-09-24 MED ORDER — RYBELSUS 14 MG PO TABS: 14.0000 mg | ORAL_TABLET | Freq: Every day | ORAL | 1 refills | Status: DC

## 2022-10-16 ENCOUNTER — Telehealth: Payer: Self-pay | Admitting: Family Medicine

## 2022-10-16 NOTE — Telephone Encounter (Signed)
We received PA for Ozempic.  Office notes show pt was changed to Rybelsus.  Will not complete PA for ozempic.

## 2022-10-17 ENCOUNTER — Other Ambulatory Visit: Payer: Self-pay | Admitting: Medical

## 2022-10-27 ENCOUNTER — Other Ambulatory Visit: Payer: Self-pay | Admitting: Medical

## 2022-10-27 MED ORDER — RYBELSUS 7 MG PO TABS: 1.0000 | ORAL_TABLET | Freq: Every day | ORAL | 0 refills | Status: DC

## 2022-11-13 ENCOUNTER — Telehealth (INDEPENDENT_AMBULATORY_CARE_PROVIDER_SITE_OTHER): Payer: 59 | Admitting: Medical

## 2022-11-13 ENCOUNTER — Encounter: Payer: Self-pay | Admitting: Medical

## 2022-11-13 DIAGNOSIS — R5383 Other fatigue: Secondary | ICD-10-CM | POA: Diagnosis not present

## 2022-11-13 DIAGNOSIS — R0981 Nasal congestion: Secondary | ICD-10-CM | POA: Diagnosis not present

## 2022-11-13 DIAGNOSIS — H00012 Hordeolum externum right lower eyelid: Secondary | ICD-10-CM

## 2022-11-13 MED ORDER — ERYTHROMYCIN 5 MG/GM OP OINT: 1.0000 | TOPICAL_OINTMENT | Freq: Three times a day (TID) | OPHTHALMIC | 0 refills | Status: DC

## 2022-11-13 NOTE — Progress Notes (Signed)
Subjective:     Patient ID: Patrick Baxter, male   DOB: [birth year], 52 y.o.   MRN: [id number]  This visit type was conducted due to national recommendations for restrictions regarding the COVID-19 Pandemic (e.g. social distancing) in an effort to limit this patient's exposure and mitigate transmission in our community.  Due to their co-morbid illnesses, this patient is at least at moderate risk for complications without adequate follow up.  This format is felt to be most appropriate for this patient at this time.    Documentation for virtual audio and video telecommunications through [place] encounter:  The patient was located at home. The provider was located in the office. The patient did consent to this visit and is aware of possible charges through their insurance for this visit.  The other persons participating in this telemedicine service were none. Time spent on call was 20 minutes and in review of previous records 20 minutes total.  This virtual service is not related to other E/M service within previous 7 days.   HPI Chief Complaint  Patient presents with   congestion/ weakness    Congestion, weakness,fatigues x 4-5 days. stye on eye that's getting worse   Virtual consult for illness.  He reports 4-5 day hx/o congestion, but as the day goes on gets fatigued, dragging along.  He notes some sneezing.  No runny nose, No fever.   Has some mild headache, no sinus pressure.  A little congestion in head and throat.   Mild sore throat.  No cough.  No NVD.  Has not done covid test.  No SOB, no edema, no chest pain.     Using tylenol and vitamin C.  Using Singulair daily.  Had stye in right eye a month or 2 ago, went away but then came back.  Right eye watery, redness, hurts to blink, nodule in lower right eyelid.  Been using oral Augmentin left over, started it yesterday.  No warm compresses.  Left eye is fine, no watery, no itchy eyes.   No rash, no tick bite.   No recent travel.      Has not checked sugars lately.  No other aggravating or relieving factors. No other complaint.  Past Medical History:  Diagnosis Date   Allergy    Anxiety    Back pain    Bilateral chronic knee pain    Chronic headaches    Depression    Dermatographism    Dry skin    Fatigue    Frequent urination    Gout    Hx of echocardiogram 09/07/2019   normal LV function, EF 60-65%, no wall motion abnormality; Dr. Tessa Lerner   Hyperlipidemia    Hypertension    Joint pain    Knee pain    Obesity    Pre-diabetes    Sleep apnea    Stress    Wears glasses    Current Outpatient Medications on File Prior to Visit  Medication Sig Dispense Refill   albuterol (VENTOLIN HFA) 108 (90 Base) MCG/ACT inhaler Inhale 2 puffs into the lungs every 6 (six) hours as needed for wheezing or shortness of breath. 8 g 0   allopurinol (ZYLOPRIM) 300 MG tablet Take 1 tablet (300 mg total) by mouth daily. 90 tablet 3   Calcium-Magnesium-Zinc 167-83-8 MG TABS Take 1 tablet by mouth daily.     cholecalciferol (VITAMIN D3) 25 MCG (1000 UNIT) tablet Take 1 tablet (1,000 Units total) by mouth daily. 90 tablet 3   Coenzyme  Q10 (CO Q 10 PO) Take by mouth.     colchicine 0.6 MG tablet Take 1 tablet (0.6 mg total) by mouth 2 (two) times daily. For flare up 60 tablet 3   cyclobenzaprine (FLEXERIL) 10 MG tablet TAKE 1/2 TO 1 TABLET BY MOUTH EVERY NIGHT AT BEDTIME AS NEEDED 30 tablet 3   FLUoxetine (PROZAC) 40 MG capsule TAKE 1 CAPSULE(40 MG) BY MOUTH DAILY 90 capsule 1   levothyroxine (SYNTHROID) 25 MCG tablet TAKE 1 TABLET BY MOUTH EVERY DAY BEFORE BREAKFAST 90 tablet 1   lisinopril (ZESTRIL) 20 MG tablet TAKE 1 TABLET(20 MG) BY MOUTH DAILY 90 tablet 1   metoprolol succinate (TOPROL-XL) 50 MG 24 hr tablet TAKE 1 TABLET BY MOUTH DAILY WITH OR IMMEDIATELY FOLLOWING A MEAL 90 tablet 0   montelukast (SINGULAIR) 10 MG tablet TAKE 1 TABLET(10 MG) BY MOUTH AT BEDTIME 90 tablet 0   Multiple Vitamins-Minerals (MULTIVITAMIN  WITH MINERALS) tablet Take 1 tablet by mouth daily. 90 tablet 3   naproxen (NAPROSYN) 250 MG tablet Take by mouth 3 (three) times daily with meals.     Omega-3 Fatty Acids (SALMON OIL-1000) 200 MG CAPS Take by mouth.     Semaglutide (RYBELSUS) 14 MG TABS Take 1 tablet (14 mg total) by mouth daily. 30 tablet 1   B Complex-C (PROBEC-T PO) Take by mouth. (Patient not taking: Reported on 11/13/2022)     nitroGLYCERIN (NITROSTAT) 0.4 MG SL tablet Place 1 tablet (0.4 mg total) under the tongue every 5 (five) minutes as needed for chest pain. 30 tablet 1   No current facility-administered medications on file prior to visit.    Review of Systems As in subjective    Objective:   Physical Exam Due to coronavirus pandemic stay at home measures, patient visit was virtual and they were not examined in person.   There were no vitals taken for this visit.  Gen: wd, wn, nad Mildly congested sounding Eye: right lower eye lid with internal erythematous conjunctiva and yellowish roughly 3mm appearing nodule/stye     Assessment:     Encounter Diagnoses  Name Primary?   Hordeolum externum of right lower eyelid Yes   Other fatigue    Head congestion        Plan:     We discussed symptoms and concerns.  His respiratory concerns could just be a viral respiratory tract infection, mild nonspecific symptoms.  Advise he do a home COVID test to help further evaluate.  Advised home blood sugar checks to make sure sugars are not out of the preferred range  Advised rest, hydration, continue Singulair but consider adding Claritin or Mucinex for both the next few days.  Can continue Tylenol and vitamin C.  Begin Romycin ointment, warm compresses, good hygiene and handwashing.  Go ahead and schedule time to see eye doctor in the next few weeks  Follow-up here soon for physical and fasting labs.  If not much improved by next week consider rechecking in person including labs  Patrick Baxter was seen today for  congestion/ weakness.  Diagnoses and all orders for this visit:  Hordeolum externum of right lower eyelid  Other fatigue  Head congestion  Other orders -     erythromycin ophthalmic ointment; Place 1 Application into the right eye 3 (three) times daily.    F/u prn

## 2022-11-25 ENCOUNTER — Other Ambulatory Visit: Payer: Self-pay | Admitting: Medical

## 2022-12-01 ENCOUNTER — Ambulatory Visit (INDEPENDENT_AMBULATORY_CARE_PROVIDER_SITE_OTHER): Payer: 59 | Admitting: Medical

## 2022-12-01 ENCOUNTER — Encounter: Payer: Self-pay | Admitting: Medical

## 2022-12-01 VITALS — BP 122/80 | HR 82 | Ht 68.0 in | Wt >= 6400 oz

## 2022-12-01 DIAGNOSIS — Z0000 Encounter for general adult medical examination without abnormal findings: Secondary | ICD-10-CM | POA: Insufficient documentation

## 2022-12-01 DIAGNOSIS — E1165 Type 2 diabetes mellitus with hyperglycemia: Secondary | ICD-10-CM

## 2022-12-01 DIAGNOSIS — Z113 Encounter for screening for infections with a predominantly sexual mode of transmission: Secondary | ICD-10-CM

## 2022-12-01 DIAGNOSIS — Z23 Encounter for immunization: Secondary | ICD-10-CM

## 2022-12-01 DIAGNOSIS — Z79899 Other long term (current) drug therapy: Secondary | ICD-10-CM | POA: Diagnosis not present

## 2022-12-01 DIAGNOSIS — Z1211 Encounter for screening for malignant neoplasm of colon: Secondary | ICD-10-CM

## 2022-12-01 DIAGNOSIS — I10 Essential (primary) hypertension: Secondary | ICD-10-CM

## 2022-12-01 DIAGNOSIS — Z7185 Encounter for immunization safety counseling: Secondary | ICD-10-CM | POA: Diagnosis not present

## 2022-12-01 DIAGNOSIS — E038 Other specified hypothyroidism: Secondary | ICD-10-CM | POA: Diagnosis not present

## 2022-12-01 DIAGNOSIS — E559 Vitamin D deficiency, unspecified: Secondary | ICD-10-CM

## 2022-12-01 DIAGNOSIS — M1000 Idiopathic gout, unspecified site: Secondary | ICD-10-CM

## 2022-12-01 DIAGNOSIS — Z125 Encounter for screening for malignant neoplasm of prostate: Secondary | ICD-10-CM

## 2022-12-01 MED ORDER — LISINOPRIL 20 MG PO TABS
ORAL_TABLET | ORAL | 3 refills | Status: DC
Start: 2022-12-01 — End: 2022-12-22

## 2022-12-01 MED ORDER — FLUOXETINE HCL 40 MG PO CAPS: ORAL_CAPSULE | ORAL | 3 refills | Status: DC

## 2022-12-01 MED ORDER — VITAMIN D 25 MCG (1000 UNIT) PO TABS: 1000.0000 [IU] | ORAL_TABLET | Freq: Every day | ORAL | 3 refills | Status: DC

## 2022-12-01 MED ORDER — FREESTYLE LIBRE 3 SENSOR MISC: 2.0000 | 11 refills | Status: DC

## 2022-12-01 MED ORDER — MONTELUKAST SODIUM 10 MG PO TABS: ORAL_TABLET | ORAL | 3 refills | Status: DC

## 2022-12-01 MED ORDER — METOPROLOL SUCCINATE ER 50 MG PO TB24: ORAL_TABLET | ORAL | 3 refills | Status: DC

## 2022-12-01 MED ORDER — MULTI-VITAMIN/MINERALS PO TABS: 1.0000 | ORAL_TABLET | Freq: Every day | ORAL | 3 refills | Status: DC

## 2022-12-01 MED ORDER — COLCHICINE 0.6 MG PO TABS: 0.6000 mg | ORAL_TABLET | Freq: Two times a day (BID) | ORAL | 1 refills | Status: DC

## 2022-12-01 MED ORDER — RYBELSUS 14 MG PO TABS: ORAL_TABLET | ORAL | 1 refills | Status: DC

## 2022-12-01 MED ORDER — ALBUTEROL SULFATE HFA 108 (90 BASE) MCG/ACT IN AERS: 2.0000 | INHALATION_SPRAY | Freq: Four times a day (QID) | RESPIRATORY_TRACT | 2 refills | Status: DC | PRN

## 2022-12-01 MED ORDER — LEVOTHYROXINE SODIUM 25 MCG PO TABS
ORAL_TABLET | ORAL | 3 refills | Status: DC
Start: 2022-12-01 — End: 2023-12-14

## 2022-12-01 MED ORDER — ALLOPURINOL 300 MG PO TABS: 300.0000 mg | ORAL_TABLET | Freq: Every day | ORAL | 3 refills | Status: DC

## 2022-12-01 NOTE — Patient Instructions (Signed)
Reminder to call us back about Cologuard  See dermatology for updated surveillance  Check insurance to see if Billie Lade shot is covered.  This may work even better than the Rybelsus.

## 2022-12-01 NOTE — Progress Notes (Signed)
Subjective:   HPI  Patrick Baxter is a 52 y.o. male who presents for Chief Complaint  Patient presents with   Medical Management of Chronic Issues    Med check- no concerns    Patient Care Team: Billy Turvey, Kermit Balo, PA-C as PCP - General (Family Medicine) Pa, [facility] Sees dentist Sees eye doctor Dr. Tessa Lerner, cardiology [facility], Dr. Bradly Bienenstock, Dr. Venita Lick,  Dr. Doreene Nest endocrinology   Concerns: He is compliant with his medications for blood pressure diabetes.  No recent gout problems.  Compliant with thyroid medicine.  He still never got around to doing a Cologuard test.   Reviewed their medical, surgical, family, social, medication, and allergy history and updated chart as appropriate.  Past Medical History:  Diagnosis Date   Allergy    Anxiety    Back pain    Bilateral chronic knee pain    Chronic headaches    Depression    Dermatographism    Dry skin    Fatigue    Frequent urination    Gout    Hx of echocardiogram 09/07/2019   normal LV function, EF 60-65%, no wall motion abnormality; Dr. Tessa Lerner   Hyperlipidemia    Hypertension    Joint pain    Knee pain    Obesity    Pre-diabetes    Sleep apnea    Stress    Wears glasses     Past Surgical History:  Procedure Laterality Date   IR INJECT/THERA/INC NEEDLE/CATH/PLC EPI/LUMB/SAC [facility]  09/14/2020   IR INJECT/THERA/INC NEEDLE/CATH/PLC EPI/LUMB/SAC W/IMG  09/14/2020   KNEE SURGERY  11/2006   right knee arthroscopy and meniscal repair, Dr. Conni Elliot    Family History  Problem Relation Age of Onset   Alzheimer's disease Mother    Heart disease Mother        35, angioplasty, CAD   Cancer Mother        breast   Stroke Mother    Hypertension Mother    Hyperlipidemia Mother    Heart disease Father        angina   Arthritis Father    Cancer Maternal Grandmother        leukemia   Diabetes Paternal Grandmother      Current Outpatient  Medications:    allopurinol (ZYLOPRIM) 300 MG tablet, Take 1 tablet (300 mg total) by mouth daily., Disp: 90 tablet, Rfl: 3   B Complex-C (PROBEC-T PO), Take by mouth., Disp: , Rfl:    Calcium-Magnesium-Zinc 167-83-8 MG TABS, Take 1 tablet by mouth daily., Disp: , Rfl:    cholecalciferol (VITAMIN D3) 25 MCG (1000 UNIT) tablet, Take 1 tablet (1,000 Units total) by mouth daily., Disp: 90 tablet, Rfl: 3   Coenzyme Q10 (CO Q 10 PO), Take by mouth., Disp: , Rfl:    Continuous Glucose Sensor (FREESTYLE LIBRE 3 SENSOR) MISC, 2 each by Does not apply route every 14 (fourteen) days., Disp: 2 each, Rfl: 11   FLUoxetine (PROZAC) 40 MG capsule, TAKE 1 CAPSULE(40 MG) BY MOUTH DAILY, Disp: 90 capsule, Rfl: 3   levothyroxine (SYNTHROID) 25 MCG tablet, TAKE 1 TABLET BY MOUTH EVERY DAY BEFORE BREAKFAST, Disp: 90 tablet, Rfl: 3   lisinopril (ZESTRIL) 20 MG tablet, TAKE 1 TABLET(20 MG) BY MOUTH DAILY, Disp: 90 tablet, Rfl: 3   metoprolol succinate (TOPROL-XL) 50 MG 24 hr tablet, Take with or immediately following a meal., Disp: 90 tablet, Rfl: 3   montelukast (SINGULAIR) 10 MG tablet,  TAKE 1 TABLET(10 MG) BY MOUTH AT BEDTIME, Disp: 90 tablet, Rfl: 3   Multiple Vitamins-Minerals (MULTIVITAMIN WITH MINERALS) tablet, Take 1 tablet by mouth daily., Disp: 90 tablet, Rfl: 3   naproxen (NAPROSYN) 250 MG tablet, Take by mouth 3 (three) times daily with meals., Disp: , Rfl:    Omega-3 Fatty Acids (SALMON OIL-1000) 200 MG CAPS, Take by mouth., Disp: , Rfl:    Semaglutide (RYBELSUS) 14 MG TABS, TAKE 1 TABLET(14 MG) BY MOUTH DAILY, Disp: 90 tablet, Rfl: 1   albuterol (VENTOLIN HFA) 108 (90 Base) MCG/ACT inhaler, Inhale 2 puffs into the lungs every 6 (six) hours as needed for wheezing or shortness of breath. (Patient not taking: Reported on 12/01/2022), Disp: 8 g, Rfl: 2   colchicine 0.6 MG tablet, Take 1 tablet (0.6 mg total) by mouth 2 (two) times daily. For flare up (Patient not taking: Reported on 12/01/2022), Disp: 60 tablet,  Rfl: 1   cyclobenzaprine (FLEXERIL) 10 MG tablet, TAKE 1/2 TO 1 TABLET BY MOUTH EVERY NIGHT AT BEDTIME AS NEEDED (Patient not taking: Reported on 12/01/2022), Disp: 30 tablet, Rfl: 3   nitroGLYCERIN (NITROSTAT) 0.4 MG SL tablet, Place 1 tablet (0.4 mg total) under the tongue every 5 (five) minutes as needed for chest pain., Disp: 30 tablet, Rfl: 1   Allergies  Allergen Reactions   Atorvastatin Other (See Comments)    myalgia   Pravastatin Sodium Other (See Comments)    myalgia    Review of Systems  Constitutional:  Negative for chills, fever, malaise/fatigue and weight loss.  HENT:  Negative for congestion, ear pain, hearing loss, sore throat and tinnitus.   Eyes:  Negative for blurred vision, pain and redness.  Respiratory:  Negative for cough, hemoptysis and shortness of breath.   Cardiovascular:  Negative for chest pain, palpitations, orthopnea, claudication and leg swelling.  Gastrointestinal:  Negative for abdominal pain, blood in stool, constipation, diarrhea, nausea and vomiting.  Genitourinary:  Negative for dysuria, flank pain, frequency, hematuria and urgency.  Musculoskeletal:  Negative for falls, joint pain and myalgias.  Skin:  Negative for itching and rash.  Neurological:  Negative for dizziness, tingling, speech change, weakness and headaches.  Endo/Heme/Allergies:  Negative for polydipsia. Does not bruise/bleed easily.  Psychiatric/Behavioral:  Negative for depression and memory loss. The patient is not nervous/anxious and does not have insomnia.         12/01/2022   10:14 AM 04/01/2022   10:05 AM 11/26/2021    9:01 AM 04/23/2021    8:35 AM 10/22/2020    8:34 AM  Depression screen PHQ 2/9  Decreased Interest 0 0 0 3 0  Down, Depressed, Hopeless 0 0 0 3 0  PHQ - 2 Score 0 0 0 6 0  Altered sleeping    0   Tired, decreased energy    2   Change in appetite    3   Feeling bad or failure about yourself     1   Trouble concentrating    1   Moving slowly or  fidgety/restless    0   Suicidal thoughts    0   PHQ-9 Score    13   Difficult doing work/chores    Not difficult at all         Objective:  BP 122/80   Pulse 82   Ht 5\' 8"  (1.727 m)   Wt (!) 421 lb 9.6 oz (191.2 kg)   BMI 64.10 kg/m   Wt Readings from Last 3  Encounters:  12/01/22 (!) 421 lb 9.6 oz (191.2 kg)  07/11/22 (!) 420 lb (190.5 kg)  05/19/22 (!) 442 lb 9.6 oz (200.8 kg)   BP Readings from Last 3 Encounters:  12/01/22 122/80  05/19/22 120/78  04/01/22 138/88    General appearance: alert, no distress, WD/WN, Caucasian male Skin:  skin tags circumferentially around the neck, and a few skin tags under left eyelid HEENT: normocephalic, conjunctiva/corneas normal, sclerae anicteric, PERRLA, EOMi Neck: supple, no lymphadenopathy, no thyromegaly, no masses, normal ROM, no bruits Chest: non tender, normal shape and expansion Heart: RRR, normal S1, S2, no murmurs Lungs: CTA bilaterally, no wheezes, rhonchi, or rales Abdomen: +bs, soft, non tender, non distended, no masses, no hepatomegaly, no splenomegaly, no bruits Back: non tender, normal ROM, no scoliosis Musculoskeletal: upper extremities non tender, no obvious deformity, normal ROM throughout, lower extremities non tender, no obvious deformity, normal ROM throughout Extremities: no edema, no cyanosis, no clubbing Pulses: 2+ symmetric, upper and lower extremities, normal cap refill Neurological: alert, oriented x 3, CN2-12 intact, strength normal upper extremities and lower extremities, sensation normal throughout, DTRs 2+ throughout, no cerebellar signs, gait normal Psychiatric: normal affect, behavior normal, pleasant  GU/rectal -deferred   Diabetic Foot Exam - Simple   Simple Foot Form Diabetic Foot exam was performed with the following findings: Yes 12/01/2022 10:43 AM  Visual Inspection See comments: Yes Sensation Testing Intact to touch and monofilament testing bilaterally: Yes Pulse Check Posterior  Tibialis and Dorsalis pulse intact bilaterally: Yes Comments Flat feet, some hammer toe deformity of several of the small toes bilat        Assessment and Plan :   Encounter Diagnoses  Name Primary?   Routine general medical examination at a health care facility Yes   Medication management    Other specified hypothyroidism    Essential hypertension    Vitamin D deficiency    Vaccine counseling    Type 2 diabetes mellitus with hyperglycemia, without long-term current use of insulin (HCC)    Screening for prostate cancer    Idiopathic gout, unspecified chronicity, unspecified site    Colon cancer screening    Screen for STD (sexually transmitted disease)     This visit was a preventative care visit, also known as wellness visit or routine physical.   Topics typically include healthy lifestyle, diet, exercise, preventative care, vaccinations, sick and well care, proper use of emergency dept and after hours care, as well as other concerns.     Recommendations: Continue to return yearly for your annual wellness and preventative care visits.  This gives [place] a chance to discuss healthy lifestyle, exercise, vaccinations, review your chart record, and perform screenings where appropriate.  I recommend you see your eye doctor yearly for routine vision care.  I recommend you see your dentist yearly for routine dental care including hygiene visits twice yearly.   Vaccination recommendations were reviewed Immunization History  Administered Date(s) Administered   Influenza,inj,Quad PF,6+ Mos 04/20/2014, 09/20/2015, 06/24/2016, 04/14/2017, 04/23/2021, 04/01/2022   Moderna Covid-19 Vaccine Bivalent Booster 55yrs & up 05/23/2021   Moderna Sars-Covid-2 Vaccination 06/12/2020   PFIZER(Purple Top)SARS-COV-2 Vaccination 09/28/2019, 10/19/2019   Pneumococcal Polysaccharide-23 11/26/2021   Tdap 10/28/2016    Advised Shingrix.  Counseled on the Shingrix vaccine.  Vaccine information sheet  given. Shingrix #1 vaccine given after consent obtained.   Return in 2 months for Shingrix #2.    Screening for cancer: Colon cancer screening: Declines today but will pursue cologuard in a few months.  He didn't do colguard the last 2 years though.  We discussed PSA, prostate exam, and prostate cancer screening risks/benefits.     Skin cancer screening: Check your skin regularly for new changes, growing lesions, or other lesions of concern Come in for evaluation if you have skin lesions of concern.  Lung cancer screening: If you have a greater than 20 pack year history of tobacco use, then you may qualify for lung cancer screening with a chest CT scan.   Please call your insurance company to inquire about coverage for this test.  We currently don't have screenings for other cancers besides breast, cervical, colon, and lung cancers.  If you have a strong family history of cancer or have other cancer screening concerns, please let me know.    Bone health: Get at least 150 minutes of aerobic exercise weekly Get weight bearing exercise at least once weekly Bone density test:  A bone density test is an imaging test that uses a type of X-ray to measure the amount of calcium and other minerals in your bones. The test may be used to diagnose or screen you for a condition that causes weak or thin bones (osteoporosis), predict your risk for a broken bone (fracture), or determine how well your osteoporosis treatment is working. The bone density test is recommended for females 65 and older, or females or males <65 if certain risk factors such as thyroid disease, long term use of steroids such as for asthma or rheumatological issues, vitamin D deficiency, estrogen deficiency, family history of osteoporosis, self or family history of fragility fracture in first degree relative.    Heart health: Get at least 150 minutes of aerobic exercise weekly Limit alcohol It is important to maintain a healthy  blood pressure and healthy cholesterol numbers  Heart disease screening: Screening for heart disease includes screening for blood pressure, fasting lipids, glucose/diabetes screening, BMI height to weight ratio, reviewed of smoking status, physical activity, and diet.    Goals include blood pressure 120/80 or less, maintaining a healthy lipid/cholesterol profile, preventing diabetes or keeping diabetes numbers under good control, not smoking or using tobacco products, exercising most days per week or at least 150 minutes per week of exercise, and eating healthy variety of fruits and vegetables, healthy oils, and avoiding unhealthy food choices like fried food, fast food, high sugar and high cholesterol foods.    Other tests may possibly include EKG test, CT coronary calcium score, echocardiogram, exercise treadmill stress test.   I reviewed your 2021 echo and stress test of your heart   Medical care options: I recommend you continue to seek care here first for routine care.  We try really hard to have available appointments Monday through Friday daytime hours for sick visits, acute visits, and physicals.  Urgent care should be used for after hours and weekends for significant issues that cannot wait till the next day.  The emergency department should be used for significant potentially life-threatening emergencies.  The emergency department is expensive, can often have long wait times for less significant concerns, so try to utilize primary care, urgent care, or telemedicine when possible to avoid unnecessary trips to the emergency department.  Virtual visits and telemedicine have been introduced since the pandemic started in 2020, and can be convenient ways to receive medical care.  We offer virtual appointments as well to assist you in a variety of options to seek medical care.   Advanced Directives: I recommend you consider completing a Health Care  Power of [employer] and Living Will.   These  documents respect your wishes and help alleviate burdens on your loved ones if you were to become terminally ill or be in a position to need those documents enforced.    You can complete Advanced Directives yourself, have them notarized, then have copies made for our office, for you and for anybody you feel should have them in safe keeping.  Or, you can have an attorney prepare these documents.   If you haven't updated your Last Will and Testament in a while, it may be worthwhile having an attorney prepare these documents together and save on some costs.       Separate significant issues discussed: Routine labs today  Continue current medicaiton.  Consider mounjaro as option to Rybelsus that may help with more weight loss.  Continue efforts at weight loss with lifestyle  He will be seeing endocrinology soon as new consult    Patrick Baxter was seen today for medical management of chronic issues.  Diagnoses and all orders for this visit:  Routine general medical examination at a health care facility -     CBC with Differential/Platelet -     CMP14+EGFR -     Lipid panel -     PSA, total and free -     TSH -     Hemoglobin A1c -     RPR+HIV+GC+CT Panel -     Hepatitis B surface antigen -     Hepatitis C antibody -     Insulin, random  Medication management  Other specified hypothyroidism -     levothyroxine (SYNTHROID) 25 MCG tablet; TAKE 1 TABLET BY MOUTH EVERY DAY BEFORE BREAKFAST -     TSH  Essential hypertension -     lisinopril (ZESTRIL) 20 MG tablet; TAKE 1 TABLET(20 MG) BY MOUTH DAILY -     Lipid panel  Vitamin D deficiency  Vaccine counseling  Type 2 diabetes mellitus with hyperglycemia, without long-term current use of insulin (HCC) -     Lipid panel -     Hemoglobin A1c -     Insulin, random  Screening for prostate cancer -     PSA, total and free  Idiopathic gout, unspecified chronicity, unspecified site  Colon cancer screening  Screen for STD (sexually  transmitted disease) -     RPR+HIV+GC+CT Panel -     Hepatitis B surface antigen -     Hepatitis C antibody  Other orders -     albuterol (VENTOLIN HFA) 108 (90 Base) MCG/ACT inhaler; Inhale 2 puffs into the lungs every 6 (six) hours as needed for wheezing or shortness of breath. (Patient not taking: Reported on 12/01/2022) -     allopurinol (ZYLOPRIM) 300 MG tablet; Take 1 tablet (300 mg total) by mouth daily. -     cholecalciferol (VITAMIN D3) 25 MCG (1000 UNIT) tablet; Take 1 tablet (1,000 Units total) by mouth daily. -     colchicine 0.6 MG tablet; Take 1 tablet (0.6 mg total) by mouth 2 (two) times daily. For flare up (Patient not taking: Reported on 12/01/2022) -     FLUoxetine (PROZAC) 40 MG capsule; TAKE 1 CAPSULE(40 MG) BY MOUTH DAILY -     metoprolol succinate (TOPROL-XL) 50 MG 24 hr tablet; Take with or immediately following a meal. -     montelukast (SINGULAIR) 10 MG tablet; TAKE 1 TABLET(10 MG) BY MOUTH AT BEDTIME -     Multiple Vitamins-Minerals (MULTIVITAMIN WITH MINERALS) tablet;  Take 1 tablet by mouth daily. -     Semaglutide (RYBELSUS) 14 MG TABS; TAKE 1 TABLET(14 MG) BY MOUTH DAILY -     Continuous Glucose Sensor (FREESTYLE LIBRE 3 SENSOR) MISC; 2 each by Does not apply route every 14 (fourteen) days.    Follow-up pending labs, yearly for physical

## 2022-12-01 NOTE — Addendum Note (Signed)
Addended by: Herminio Commons A on: 12/01/2022 11:03 AM   Modules accepted: Orders

## 2022-12-02 ENCOUNTER — Other Ambulatory Visit: Payer: Self-pay | Admitting: Medical

## 2022-12-02 ENCOUNTER — Telehealth: Payer: Self-pay | Admitting: Medical

## 2022-12-02 LAB — INSULIN, RANDOM: INSULIN: 85.4 u[IU]/mL — ABNORMAL HIGH (ref 2.6–24.9)

## 2022-12-02 LAB — CMP14+EGFR
Albumin/Globulin Ratio: 1.3 (ref 1.2–2.2)
Alkaline Phosphatase: 88 IU/L (ref 44–121)
Creatinine, Ser: 0.85 mg/dL (ref 0.76–1.27)
Glucose: 123 mg/dL — ABNORMAL HIGH (ref 70–99)
Total Protein: 7.3 g/dL (ref 6.0–8.5)

## 2022-12-02 LAB — CBC WITH DIFFERENTIAL/PLATELET
Hematocrit: 42.9 % (ref 37.5–51.0)
Hemoglobin: 15.1 g/dL (ref 13.0–17.7)
Immature Grans (Abs): 0.1 10*3/uL (ref 0.0–0.1)
MCH: 32.4 pg (ref 26.6–33.0)
MCV: 92 fL (ref 79–97)

## 2022-12-02 LAB — HEMOGLOBIN A1C: Est. average glucose Bld gHb Est-mCnc: 137 mg/dL

## 2022-12-02 LAB — LIPID PANEL: Cholesterol, Total: 181 mg/dL (ref 100–199)

## 2022-12-02 LAB — PSA, TOTAL AND FREE: Prostate Specific Ag, Serum: 5.6 ng/mL — ABNORMAL HIGH (ref 0.0–4.0)

## 2022-12-02 NOTE — Telephone Encounter (Signed)
-----   Message from Jac Canavan, PA-C sent at 12/02/2022  3:30 PM EDT ----- Results sent through My Chart  Schedule I month follow-up on elevated prostate blood test

## 2022-12-02 NOTE — Progress Notes (Signed)
Results sent through My Chart  Schedule I month follow-up on elevated prostate blood test

## 2022-12-02 NOTE — Telephone Encounter (Signed)
Called and left a vm to call back and schedule a one month fu with Vincenza Hews

## 2022-12-03 LAB — CMP14+EGFR
ALT: 34 IU/L (ref 0–44)
AST: 37 IU/L (ref 0–40)
Albumin: 4.1 g/dL (ref 3.8–4.9)
BUN/Creatinine Ratio: 22 — ABNORMAL HIGH (ref 9–20)
BUN: 19 mg/dL (ref 6–24)
Bilirubin Total: 0.5 mg/dL (ref 0.0–1.2)
CO2: 24 mmol/L (ref 20–29)
Calcium: 9.7 mg/dL (ref 8.7–10.2)
Chloride: 99 mmol/L (ref 96–106)
Globulin, Total: 3.2 g/dL (ref 1.5–4.5)
Potassium: 4.6 mmol/L (ref 3.5–5.2)
Sodium: 137 mmol/L (ref 134–144)
eGFR: 104 mL/min/{1.73_m2} (ref >59)

## 2022-12-03 LAB — CBC WITH DIFFERENTIAL/PLATELET
Basophils Absolute: 0.1 10*3/uL (ref 0.0–0.2)
Basos: 1 %
EOS (ABSOLUTE): 0.2 10*3/uL (ref 0.0–0.4)
Eos: 3 %
Immature Granulocytes: 1 %
Lymphocytes Absolute: 1.8 10*3/uL (ref 0.7–3.1)
Lymphs: 21 %
MCHC: 35.2 g/dL (ref 31.5–35.7)
Monocytes Absolute: 0.6 10*3/uL (ref 0.1–0.9)
Monocytes: 8 %
Neutrophils Absolute: 5.5 10*3/uL (ref 1.4–7.0)
Neutrophils: 66 %
Platelets: 180 10*3/uL (ref 150–450)
RBC: 4.66 x10E6/uL (ref 4.14–5.80)
RDW: 12.5 % (ref 11.6–15.4)
WBC: 8.2 10*3/uL (ref 3.4–10.8)

## 2022-12-03 LAB — RPR+HIV+GC+CT PANEL
Chlamydia trachomatis, NAA: NEGATIVE
HIV Screen 4th Generation wRfx: NONREACTIVE
Neisseria Gonorrhoeae by PCR: NEGATIVE
RPR Ser Ql: NONREACTIVE

## 2022-12-03 LAB — HEPATITIS B SURFACE ANTIGEN: Hepatitis B Surface Ag: NEGATIVE

## 2022-12-03 LAB — LIPID PANEL
Chol/HDL Ratio: 6.2 ratio — ABNORMAL HIGH (ref 0.0–5.0)
HDL: 29 mg/dL — ABNORMAL LOW (ref >39)
LDL Chol Calc (NIH): 132 mg/dL — ABNORMAL HIGH (ref 0–99)
Triglycerides: 109 mg/dL (ref 0–149)
VLDL Cholesterol Cal: 20 mg/dL (ref 5–40)

## 2022-12-03 LAB — TSH: TSH: 2.57 u[IU]/mL (ref 0.450–4.500)

## 2022-12-03 LAB — HEPATITIS C ANTIBODY: Hep C Virus Ab: NONREACTIVE

## 2022-12-03 LAB — PSA, TOTAL AND FREE
PSA, Free Pct: 2.1 %
PSA, Free: 0.12 ng/mL

## 2022-12-03 LAB — HEMOGLOBIN A1C: Hgb A1c MFr Bld: 6.4 % — ABNORMAL HIGH (ref 4.8–5.6)

## 2022-12-03 NOTE — Progress Notes (Signed)
Results sent through MyChart

## 2022-12-10 ENCOUNTER — Other Ambulatory Visit: Payer: Self-pay | Admitting: Medical

## 2022-12-10 MED ORDER — CYCLOBENZAPRINE HCL 10 MG PO TABS: ORAL_TABLET | ORAL | 3 refills | Status: DC

## 2022-12-19 ENCOUNTER — Other Ambulatory Visit: Payer: Self-pay | Admitting: Medical

## 2022-12-21 ENCOUNTER — Other Ambulatory Visit: Payer: Self-pay | Admitting: Medical

## 2022-12-21 DIAGNOSIS — I10 Essential (primary) hypertension: Secondary | ICD-10-CM

## 2023-01-26 ENCOUNTER — Encounter: Payer: 59 | Admitting: Medical

## 2023-02-03 ENCOUNTER — Ambulatory Visit: Payer: 59 | Admitting: Medical

## 2023-02-04 ENCOUNTER — Encounter: Payer: Self-pay | Admitting: Medical

## 2023-02-04 ENCOUNTER — Ambulatory Visit: Payer: 59 | Admitting: Medical

## 2023-02-04 ENCOUNTER — Other Ambulatory Visit: Payer: 59

## 2023-02-04 VITALS — Wt >= 6400 oz

## 2023-02-04 DIAGNOSIS — H6690 Otitis media, unspecified, unspecified ear: Secondary | ICD-10-CM

## 2023-02-04 DIAGNOSIS — H6122 Impacted cerumen, left ear: Secondary | ICD-10-CM | POA: Diagnosis not present

## 2023-02-04 DIAGNOSIS — Z23 Encounter for immunization: Secondary | ICD-10-CM

## 2023-02-04 DIAGNOSIS — R42 Dizziness and giddiness: Secondary | ICD-10-CM | POA: Diagnosis not present

## 2023-02-04 DIAGNOSIS — R112 Nausea with vomiting, unspecified: Secondary | ICD-10-CM | POA: Diagnosis not present

## 2023-02-04 MED ORDER — MECLIZINE HCL 25 MG PO TABS: 25.0000 mg | ORAL_TABLET | Freq: Two times a day (BID) | ORAL | 0 refills | Status: DC

## 2023-02-04 MED ORDER — AMOXICILLIN 875 MG PO TABS: 875.0000 mg | ORAL_TABLET | Freq: Two times a day (BID) | ORAL | 0 refills | Status: AC

## 2023-02-04 NOTE — Progress Notes (Signed)
Subjective:  Patrick Baxter is a 52 y.o. male who presents for Chief Complaint  Patient presents with   Other    Has some vertigo going on, dizziness, yesterday and nausea, shingrix shot,      Here for some vertigo concerns.  Started yesterday about 3am, got out of the bed, staggered, felt dizzy.   Dizziness is room spinning sensation, nausea.  No chest pain, no palpitations no dyspnea.  Vomited a few times.  No URI symptoms such as cough, runny nose, no sore throat.    No new numbness tingling or weakness.     Used aleve and tylenol.  Was originally scheduled to come in today for Shingrix.    No other aggravating or relieving factors.    No other c/o.  Past Medical History:  Diagnosis Date   Allergy    Anxiety    Back pain    Bilateral chronic knee pain    Chronic headaches    Depression    Dermatographism    Dry skin    Fatigue    Frequent urination    Gout    Hx of echocardiogram 09/07/2019   normal LV function, EF 60-65%, no wall motion abnormality; Dr. Tessa Lerner   Hyperlipidemia    Hypertension    Joint pain    Knee pain    Obesity    Pre-diabetes    Sleep apnea    Stress    Wears glasses    Current Outpatient Medications on File Prior to Visit  Medication Sig Dispense Refill   allopurinol (ZYLOPRIM) 300 MG tablet Take 1 tablet (300 mg total) by mouth daily. 90 tablet 3   B Complex-C (PROBEC-T PO) Take by mouth.     Calcium-Magnesium-Zinc 167-83-8 MG TABS Take 1 tablet by mouth daily.     cholecalciferol (VITAMIN D3) 25 MCG (1000 UNIT) tablet Take 1 tablet (1,000 Units total) by mouth daily. 90 tablet 3   Coenzyme Q10 (CO Q 10 PO) Take by mouth.     FLUoxetine (PROZAC) 40 MG capsule TAKE 1 CAPSULE(40 MG) BY MOUTH DAILY 90 capsule 3   levothyroxine (SYNTHROID) 25 MCG tablet TAKE 1 TABLET BY MOUTH EVERY DAY BEFORE BREAKFAST 90 tablet 3   lisinopril (ZESTRIL) 20 MG tablet TAKE 1 TABLET(20 MG) BY MOUTH DAILY 90 tablet 1   metoprolol succinate (TOPROL-XL) 50  MG 24 hr tablet Take with or immediately following a meal. 90 tablet 3   montelukast (SINGULAIR) 10 MG tablet TAKE 1 TABLET(10 MG) BY MOUTH AT BEDTIME 90 tablet 3   Multiple Vitamins-Minerals (MULTIVITAMIN WITH MINERALS) tablet Take 1 tablet by mouth daily. 90 tablet 3   naproxen (NAPROSYN) 250 MG tablet Take by mouth 3 (three) times daily with meals.     Omega-3 Fatty Acids (SALMON OIL-1000) 200 MG CAPS Take by mouth.     Semaglutide (RYBELSUS) 14 MG TABS TAKE 1 TABLET(14 MG) BY MOUTH DAILY 90 tablet 1   albuterol (VENTOLIN HFA) 108 (90 Base) MCG/ACT inhaler Inhale 2 puffs into the lungs every 6 (six) hours as needed for wheezing or shortness of breath. (Patient not taking: Reported on 12/01/2022) 8 g 2   colchicine 0.6 MG tablet Take 1 tablet (0.6 mg total) by mouth 2 (two) times daily. For flare up (Patient not taking: Reported on 12/01/2022) 60 tablet 1   Continuous Glucose Sensor (FREESTYLE LIBRE 3 SENSOR) MISC 2 each by Does not apply route every 14 (fourteen) days. (Patient not taking: Reported on 02/04/2023) 2 each  11   cyclobenzaprine (FLEXERIL) 10 MG tablet TAKE 1/2 TO 1 TABLET BY MOUTH EVERY NIGHT AT BEDTIME AS NEEDED (Patient not taking: Reported on 02/04/2023) 30 tablet 3   nitroGLYCERIN (NITROSTAT) 0.4 MG SL tablet Place 1 tablet (0.4 mg total) under the tongue every 5 (five) minutes as needed for chest pain. 30 tablet 1   No current facility-administered medications on file prior to visit.    The following portions of the patient's history were reviewed and updated as appropriate: allergies, current medications, past family history, past medical history, past social history, past surgical history and problem list.  ROS Otherwise as in subjective above    Objective: Wt (!) 400 lb 3.2 oz (181.5 kg)   BMI 60.85 kg/m  Wt Readings from Last 3 Encounters:  02/04/23 (!) 400 lb 3.2 oz (181.5 kg)  12/01/22 (!) 421 lb 9.6 oz (191.2 kg)  07/11/22 (!) 420 lb (190.5 kg)   General  appearance: alert, no distress, well developed, well nourished HEENT: normocephalic, sclerae anicteric, conjunctiva pink and moist, TMs initially not visualized due to cerumen , but after lavage, left ear with erythema and decreased light reflex compared to normal appearing right TM, nares patent, no discharge or erythema, pharynx normal Oral cavity: MMM, no lesions Neck: supple, no lymphadenopathy, no thyromegaly, no masses Heart: RRR, normal S1, S2, no murmurs Lungs: CTA bilaterally, no wheezes, rhonchi, or rales Pulses: 2+ radial pulses, 2+ pedal pulses, normal cap refill Ext: no edema    Assessment: Encounter Diagnoses  Name Primary?   Vertigo Yes   Nausea and vomiting, unspecified vomiting type    Acute otitis media, unspecified otitis media type    Need for shingles vaccine      Plan: We discussed symptoms and concerns and possible causes of vertigo.  After ear lavage is apparent his left ear is infected.  Begin amoxicillin.  Begin meclizine.  Hold Singulair for the next 5 days while on meclizine.  Hydrate well.  If not much improved within the next week then recheck.  Discussed findings.  Discussed risk/benefits of procedure and patient agrees to procedure. Successfully used warm water lavage to remove impacted cerumen from bilat ear canal. Patient tolerated procedure well. Advised they avoid using any cotton swabs or other devices to clean the ear canals.  Use basic hygiene as discussed.  Follow up prn.   Counseled on the Shingrix vaccine.  Vaccine information sheet given. Shingrix #2 vaccine given after consent obtained.      [name] was seen today for other.  Diagnoses and all orders for this visit:  Vertigo  Nausea and vomiting, unspecified vomiting type  Acute otitis media, unspecified otitis media type  Need for shingles vaccine -     Zoster Recombinant (Shingrix )  Other orders -     meclizine (ANTIVERT) 25 MG tablet; Take 1 tablet (25 mg total) by mouth  2 (two) times daily. -     amoxicillin (AMOXIL) 875 MG tablet; Take 1 tablet (875 mg total) by mouth 2 (two) times daily for 10 days.    Follow up: prn

## 2023-02-09 ENCOUNTER — Other Ambulatory Visit: Payer: Self-pay | Admitting: Medical

## 2023-02-09 MED ORDER — CIPRO HC 0.2-1 % OT SUSP: 4.0000 [drp] | Freq: Two times a day (BID) | OTIC | 0 refills | Status: AC

## 2023-02-23 ENCOUNTER — Other Ambulatory Visit: Payer: Self-pay | Admitting: Medical

## 2023-02-23 DIAGNOSIS — R42 Dizziness and giddiness: Secondary | ICD-10-CM

## 2023-02-23 DIAGNOSIS — H68009 Unspecified Eustachian salpingitis, unspecified ear: Secondary | ICD-10-CM

## 2023-04-07 LAB — HM DIABETES EYE EXAM

## 2023-04-08 ENCOUNTER — Encounter: Payer: Self-pay | Admitting: Internal Medicine

## 2023-04-21 ENCOUNTER — Other Ambulatory Visit: Payer: Self-pay | Admitting: Physical Medicine and Rehabilitation

## 2023-04-21 DIAGNOSIS — H9122 Sudden idiopathic hearing loss, left ear: Secondary | ICD-10-CM

## 2023-04-24 ENCOUNTER — Other Ambulatory Visit: Payer: Self-pay | Admitting: Medical

## 2023-04-24 DIAGNOSIS — I10 Essential (primary) hypertension: Secondary | ICD-10-CM

## 2023-05-21 ENCOUNTER — Other Ambulatory Visit: Payer: Self-pay | Admitting: Medical

## 2023-05-21 DIAGNOSIS — I10 Essential (primary) hypertension: Secondary | ICD-10-CM

## 2023-06-19 ENCOUNTER — Other Ambulatory Visit: Payer: Self-pay | Admitting: Medical

## 2023-06-19 DIAGNOSIS — I10 Essential (primary) hypertension: Secondary | ICD-10-CM

## 2023-06-22 ENCOUNTER — Telehealth: Payer: Self-pay | Admitting: Medical

## 2023-06-22 ENCOUNTER — Other Ambulatory Visit: Payer: Self-pay | Admitting: Medical

## 2023-06-22 NOTE — Telephone Encounter (Signed)
Pt has requested refill on rybelsus  , he just lost his job and insurance terminates tomorrow  He would like to try to get 90 day supply

## 2023-06-22 NOTE — Telephone Encounter (Signed)
refilled 

## 2023-06-23 ENCOUNTER — Telehealth: Payer: Self-pay | Admitting: Internal Medicine

## 2023-06-23 MED ORDER — RYBELSUS 14 MG PO TABS: ORAL_TABLET | ORAL | 0 refills | Status: DC

## 2023-06-23 NOTE — Telephone Encounter (Signed)
Pt called and states that he only needs 30 as insurance isn't covering 90 days

## 2023-08-27 ENCOUNTER — Telehealth (INDEPENDENT_AMBULATORY_CARE_PROVIDER_SITE_OTHER): Payer: 59 | Admitting: Medical

## 2023-08-27 VITALS — Wt >= 6400 oz

## 2023-08-27 DIAGNOSIS — J988 Other specified respiratory disorders: Secondary | ICD-10-CM | POA: Diagnosis not present

## 2023-08-27 DIAGNOSIS — R058 Other specified cough: Secondary | ICD-10-CM | POA: Diagnosis not present

## 2023-08-27 DIAGNOSIS — Z20828 Contact with and (suspected) exposure to other viral communicable diseases: Secondary | ICD-10-CM | POA: Diagnosis not present

## 2023-08-27 DIAGNOSIS — J3489 Other specified disorders of nose and nasal sinuses: Secondary | ICD-10-CM

## 2023-08-27 MED ORDER — HYDROCODONE BIT-HOMATROP MBR 5-1.5 MG/5ML PO SOLN
5.0000 mL | Freq: Three times a day (TID) | ORAL | 0 refills | Status: AC | PRN
Start: 2023-08-27 — End: 2023-09-01

## 2023-08-27 MED ORDER — AZITHROMYCIN 250 MG PO TABS: ORAL_TABLET | ORAL | 0 refills | Status: DC

## 2023-08-27 NOTE — Progress Notes (Signed)
Subjective:     Patient ID: Patrick Baxter, male   DOB: 1971-01-15, 53 y.o.   MRN: [id number]  This visit type was conducted due to national recommendations for restrictions regarding the COVID-19 Pandemic (e.g. social distancing) in an effort to limit this patient's exposure and mitigate transmission in our community.  Due to their co-morbid illnesses, this patient is at least at moderate risk for complications without adequate follow up.  This format is felt to be most appropriate for this patient at this time.    Documentation for virtual audio and video telecommunications through [facility] encounter:  The patient was located at home. The provider was located in the office. The patient did consent to this visit and is aware of possible charges through their insurance for this visit.  The other persons participating in this telemedicine service were none. Time spent on call was 20 minutes and in review of previous records 20 minutes total.  This virtual service is not related to other E/M service within previous 7 days.   HPI Chief Complaint  Patient presents with   flu like symptoms    Flu like symptoms, dad was positive for flu. Symptoms started last Thursday- cough, pressure in throat, sinus pressure, possible head cold.    Virtual consult for illness.  He reports symptoms started a week ago including cough, sinus pressure, sore throat, headache.  No chills, no fever.   Over weekend 5 days ago was sick.   He did a test a [facility] over weekend, and was negative for flu and covid, but his elderly father tested positive for flu.  Father had to be hospitalized.  Patrick Baxter has been around his father during this time  He notes mild SOB, weak feeling.   No NVD.   His main concern is colored mucus now and just generally weak  Using sudafed and Nyquil.  Been using some delsym at times.  Coughing a lot a night.  Getting a lot of drainage and mucous.  No other aggravating or relieving factors. No  other complaint.   Past Medical History:  Diagnosis Date   Allergy    Anxiety    Back pain    Bilateral chronic knee pain    Chronic headaches    Depression    Dermatographism    Dry skin    Fatigue    Frequent urination    Gout    Hx of echocardiogram 09/07/2019   normal LV function, EF 60-65%, no wall motion abnormality; Dr. Tessa Lerner   Hyperlipidemia    Hypertension    Joint pain    Knee pain    Obesity    Pre-diabetes    Sleep apnea    Stress    Wears glasses    Current Outpatient Medications on File Prior to Visit  Medication Sig Dispense Refill   albuterol (VENTOLIN HFA) 108 (90 Base) MCG/ACT inhaler Inhale 2 puffs into the lungs every 6 (six) hours as needed for wheezing or shortness of breath. 8 g 2   allopurinol (ZYLOPRIM) 300 MG tablet Take 1 tablet (300 mg total) by mouth daily. 90 tablet 3   B Complex-C (PROBEC-T PO) Take by mouth.     Calcium-Magnesium-Zinc 167-83-8 MG TABS Take 1 tablet by mouth daily.     cholecalciferol (VITAMIN D3) 25 MCG (1000 UNIT) tablet Take 1 tablet (1,000 Units total) by mouth daily. 90 tablet 3   Coenzyme Q10 (CO Q 10 PO) Take by mouth.     colchicine 0.6  MG tablet Take 1 tablet (0.6 mg total) by mouth 2 (two) times daily. For flare up 60 tablet 1   FLUoxetine (PROZAC) 40 MG capsule TAKE 1 CAPSULE(40 MG) BY MOUTH DAILY 90 capsule 3   levothyroxine (SYNTHROID) 25 MCG tablet TAKE 1 TABLET BY MOUTH EVERY DAY BEFORE BREAKFAST 90 tablet 3   lisinopril (ZESTRIL) 20 MG tablet TAKE 1 TABLET(20 MG) BY MOUTH DAILY 90 tablet 1   metoprolol succinate (TOPROL-XL) 50 MG 24 hr tablet Take with or immediately following a meal. 90 tablet 3   montelukast (SINGULAIR) 10 MG tablet TAKE 1 TABLET(10 MG) BY MOUTH AT BEDTIME 90 tablet 3   Multiple Vitamins-Minerals (MULTIVITAMIN WITH MINERALS) tablet Take 1 tablet by mouth daily. 90 tablet 3   naproxen (NAPROSYN) 250 MG tablet Take by mouth 3 (three) times daily with meals.     Omega-3 Fatty Acids  (SALMON OIL-1000) 200 MG CAPS Take by mouth.     Semaglutide (RYBELSUS) 14 MG TABS TAKE 1 TABLET(14 MG) BY MOUTH DAILY 30 tablet 0   Continuous Glucose Sensor (FREESTYLE LIBRE 3 SENSOR) MISC 2 each by Does not apply route every 14 (fourteen) days. (Patient not taking: Reported on 02/04/2023) 2 each 11   meclizine (ANTIVERT) 25 MG tablet Take 1 tablet (25 mg total) by mouth 2 (two) times daily. 30 tablet 0   nitroGLYCERIN (NITROSTAT) 0.4 MG SL tablet Place 1 tablet (0.4 mg total) under the tongue every 5 (five) minutes as needed for chest pain. 30 tablet 1   No current facility-administered medications on file prior to visit.     Review of Systems As in subjective    Objective:   Physical Exam Due to coronavirus pandemic stay at home measures, patient visit was virtual and they were not examined in person.   Wt (!) 400 lb (181.4 kg)   BMI 60.82 kg/m   Gen: wd, wn, nad No labored breathing or wheezing     Assessment:     Encounter Diagnoses  Name Primary?   Sinus pressure Yes   Cough productive of purulent sputum    Exposure to the flu    Respiratory tract infection        Plan:     We discussed symptoms and concerns.  He has had close contact with influenza in the past week and his symptoms have been present for over a week now but he is now getting more productive mucus.  Advise rest, hydration, continue Pedialyte and electrolytes, can use Mucinex for mucus or allergy medication such as Zyrtec or Benadryl at night, given the change in symptoms, begin Z-Pak antibiotic for possible secondary bacterial infection at this point.  Use your inhaler as needed. can use Hycodan syrup for worse cough.  If not much improved the next 3-4 days, then call back  If worse over the weekend such as extreme weakness, shortness of breath, consider evaluation in the emergency department.  Patrick "Patrick Baxter" was seen today for flu like symptoms.  Diagnoses and all orders for this visit:  Sinus  pressure  Cough productive of purulent sputum  Exposure to the flu  Respiratory tract infection  Other orders -     azithromycin (ZITHROMAX) 250 MG tablet; 2 tablets day 1, then 1 tablet days 2-4 -     HYDROcodone bit-homatropine (HYCODAN) 5-1.5 MG/5ML syrup; Take 5 mLs by mouth every 8 (eight) hours as needed for up to 5 days for cough.  Follow-up as needed

## 2023-10-12 ENCOUNTER — Encounter: Payer: Self-pay | Admitting: Family Medicine

## 2023-10-12 ENCOUNTER — Ambulatory Visit: Payer: Self-pay | Admitting: Medical

## 2023-10-12 ENCOUNTER — Ambulatory Visit: Admitting: Family Medicine

## 2023-10-12 VITALS — BP 132/80 | HR 80 | Temp 98.7°F | Ht 68.0 in | Wt >= 6400 oz

## 2023-10-12 DIAGNOSIS — R52 Pain, unspecified: Secondary | ICD-10-CM

## 2023-10-12 DIAGNOSIS — I10 Essential (primary) hypertension: Secondary | ICD-10-CM

## 2023-10-12 DIAGNOSIS — U071 COVID-19: Secondary | ICD-10-CM | POA: Diagnosis not present

## 2023-10-12 DIAGNOSIS — R0989 Other specified symptoms and signs involving the circulatory and respiratory systems: Secondary | ICD-10-CM | POA: Diagnosis not present

## 2023-10-12 DIAGNOSIS — J301 Allergic rhinitis due to pollen: Secondary | ICD-10-CM | POA: Diagnosis not present

## 2023-10-12 LAB — POCT INFLUENZA A/B
Influenza A, POC: NEGATIVE
Influenza B, POC: NEGATIVE

## 2023-10-12 LAB — POC COVID19 BINAXNOW: SARS Coronavirus 2 Ag: POSITIVE — AB

## 2023-10-12 MED ORDER — NIRMATRELVIR/RITONAVIR (PAXLOVID)TABLET: 3.0000 | ORAL_TABLET | Freq: Two times a day (BID) | ORAL | 0 refills | Status: AC

## 2023-10-12 NOTE — Patient Instructions (Signed)
  For your allergies--continue the montelukast daily. Continue the zyrtec daily. I recommend starting Flonase--take 2 gentle sniffs into each nostril, once daily.  This can take up to 1-2 weeks to have the full effects.  If/when your allergies improve, you can probably stop the zyrtec, but continue the Flonase.  You may be able to drop the flonase dose to 1 spray into each nostril daily if your symptoms are well controlled. Use it through the spring allergy season.   You are taking too many types of guaifenesin. I recommend switching to a Mucinex 12-hour PLAIN (only guaifenesin), and take this twice daily.  This helps loosen up the mucus and phlegm. Stop all the other versions (liquid, pills, etc). Stop the Delsym tablets, and instead get the 12 hour syrup.  Take it at bedtime. You may take it during the day, if needed for cough. Use your inhaler (albuterol) if needed for any wheezing or shortness of breath (you should have a refill at the pharmacy).  Avoid taking sudafed unless you are monitoring your blood pressure. This can raise your blood pressure. If you have sinus pressure or pain, use sinus rinses--either neti-pot or the NeilMed Sinus Rinse Kit.  Use boiled or distilled water (not tap water).  Use this twice daily.  This can help prevent sinus infection, and help with sinus pain.  Current guidelines state that you need to remain at home until you are fever-free for 24 hours without the use of fever-reducing medications (tylenol, ibuprofen, etc)..  Your respiratory symptoms should be significantly improved. Wear a mask for 5 days upon return to work.  Check with your endocrinologist re: Rybelsus and insurance coverage.

## 2023-10-12 NOTE — Telephone Encounter (Signed)
 Copied from CRM [id number]. Topic: Clinical - Red Word Triage >> Oct 12, 2023  9:39 AM Bridgette M wrote: Red Word that prompted transfer to Nurse Triage: difficulty breathing  Congestion in the head, both nostrils stopped, difficulty breathing, can't taste, can't smell, red and watery eyes. Taking zyrtec and sudafed but nothing is helping   Chief Complaint: sinus congestion Symptoms: cough Frequency: constant Pertinent Negatives: Patient denies fever Disposition: [] ED /[] Urgent Care (no appt availability in office) / [x] Appointment(In office/virtual)/ []  [facility] [facility]/ [] Home Care/ [] Refused Recommended Disposition /[] [facility]/ []  Follow-up with PCP Additional Notes: appt scheduled for today withDr. knapp  Reason for Disposition  [1] Sinus congestion (pressure, fullness) AND [2] present > 10 days  Answer Assessment - Initial Assessment Questions 1. LOCATION: "Where does it hurt?"      Headache, pressure behind eyes  2. ONSET: "When did the sinus pain start?"  (e.g., hours, days)      3 weeks ago  3. SEVERITY: "How bad is the pain?"   (Scale 1-10; mild, moderate or severe)   - MILD (1-3): doesn't interfere with normal activities    - MODERATE (4-7): interferes with normal activities (e.g., work or school) or awakens from sleep   - SEVERE (8-10): excruciating pain and patient unable to do any normal activities        Mild  4. RECURRENT SYMPTOM: "Have you ever had sinus problems before?" If Yes, ask: "When was the last time?" and "What happened that time?"      Yes, has been on singulair for a while  5. NASAL CONGESTION: "Is the nose blocked?" If Yes, ask: "Can you open it or must you breathe through your mouth?"     Yes.,both nostril  6. NASAL DISCHARGE: "Do you have discharge from your nose?" If so ask, "What color?"     Clear   7. FEVER: "Do you have a fever?" If Yes, ask: "What is it, how was it measured, and when did it start?"      No   8. OTHER  SYMPTOMS: "Do you have any other symptoms?" (e.g., sore throat, cough, earache, difficulty breathing)     Productive cough, watery eyes, body aches  9. PREGNANCY: "Is there any chance you are pregnant?" "When was your last menstrual period?"     N/a  Protocols used: Sinus Pain or Congestion-A-AH

## 2023-10-12 NOTE — Progress Notes (Signed)
 Chief Complaint  Patient presents with   Facial Pain    For the last 3 weeks thought he was having sinus/allergy issues. Friday started feeling achy and then Sat felt better. Last night he started having watery eyes, left worse than right-some eye pressure. Congestion has gotten better.    Triage nursing notes: Congestion in the head, both nostrils stopped, difficulty breathing, can't taste, can't smell, red and watery eyes. Taking zyrtec and sudafed but nothing is helping   Allergy symptoms/congestion x 3 weeks. 3/28 (3 days ago) he developed myalgias, and lost taste/smell. Saturday the achiness improved, and taste was a little better. Yesterday his nose got completely stopped up, couldn't smell/taste.  No fever or chills, but he has been taking tylenol for headaches and achiness. Also taking mucinex (short-acting), and delsym tablets for cough (only sporadically). He has also been taking robitussin and mucinex syrup. The cough has improved. Cough had been clear-white, but got less productive over the weekend. Nasal drainage has been clear.  Taking things sporadically--unsure if he is overlapping any of the medications (multiple with guaifenesin).  H/o chronic allergies. Continues to take montelukast daily.  Just started zyrtec last week. Uses saline nasal spray. Hasn't used nasal steroid sprays.  Watery eyes started last night.  He noticed blood in the left eye this morning.  Denies vision changes, denies itchy eyes.  Had COVID vaccines x3, no bivalent boosters.    PMH, PSH, SH reviewed  Depression/anxiety, gout, HTN Diabetes  Outpatient Encounter Medications as of 10/12/2023  Medication Sig Note   allopurinol (ZYLOPRIM) 300 MG tablet Take 1 tablet (300 mg total) by mouth daily.    B Complex-C (PROBEC-T PO) Take by mouth.    Calcium-Magnesium-Zinc 167-83-8 MG TABS Take 1 tablet by mouth daily.    cetirizine (ZYRTEC) 10 MG tablet Take 10 mg by mouth daily.    cholecalciferol  (VITAMIN D3) 25 MCG (1000 UNIT) tablet Take 1 tablet (1,000 Units total) by mouth daily.    Coenzyme Q10 (CO Q 10 PO) Take by mouth.    FLUoxetine (PROZAC) 40 MG capsule TAKE 1 CAPSULE(40 MG) BY MOUTH DAILY    guaiFENesin (MUCINEX PO) Take 1 tablet by mouth every 4 (four) hours. 10/12/2023: Last dose today   levothyroxine (SYNTHROID) 25 MCG tablet TAKE 1 TABLET BY MOUTH EVERY DAY BEFORE BREAKFAST    lisinopril (ZESTRIL) 20 MG tablet TAKE 1 TABLET(20 MG) BY MOUTH DAILY    metoprolol succinate (TOPROL-XL) 50 MG 24 hr tablet Take with or immediately following a meal.    montelukast (SINGULAIR) 10 MG tablet TAKE 1 TABLET(10 MG) BY MOUTH AT BEDTIME    Multiple Vitamins-Minerals (MULTIVITAMIN WITH MINERALS) tablet Take 1 tablet by mouth daily.    Omega-3 Fatty Acids (SALMON OIL-1000) 200 MG CAPS Take by mouth.    phenylephrine (SUDAFED PE) 10 MG TABS tablet Take 10 mg by mouth every 4 (four) hours as needed. 10/12/2023: Last dose over weekend   sodium chloride (OCEAN) 0.65 % SOLN nasal spray Place 1 spray into both nostrils as needed for congestion. 10/12/2023: Used today   albuterol (VENTOLIN HFA) 108 (90 Base) MCG/ACT inhaler Inhale 2 puffs into the lungs every 6 (six) hours as needed for wheezing or shortness of breath. (Patient not taking: Reported on 10/12/2023) 10/12/2023: Does noy currently have   colchicine 0.6 MG tablet Take 1 tablet (0.6 mg total) by mouth 2 (two) times daily. For flare up (Patient not taking: Reported on 10/12/2023) 10/12/2023: As needed   Continuous  Glucose Sensor (FREESTYLE LIBRE 3 SENSOR) MISC 2 each by Does not apply route every 14 (fourteen) days. (Patient not taking: Reported on 10/12/2023)    meclizine (ANTIVERT) 25 MG tablet Take 1 tablet (25 mg total) by mouth 2 (two) times daily. (Patient not taking: Reported on 10/12/2023) 10/12/2023: As needed   naproxen (NAPROSYN) 250 MG tablet Take by mouth 3 (three) times daily with meals. (Patient not taking: Reported on 10/12/2023)  10/12/2023: As needed   nitroGLYCERIN (NITROSTAT) 0.4 MG SL tablet Place 1 tablet (0.4 mg total) under the tongue every 5 (five) minutes as needed for chest pain.    Semaglutide (RYBELSUS) 14 MG TABS TAKE 1 TABLET(14 MG) BY MOUTH DAILY (Patient not taking: Reported on 10/12/2023) 10/12/2023: Has not taken since Dec due to insurance   [DISCONTINUED] azithromycin (ZITHROMAX) 250 MG tablet 2 tablets day 1, then 1 tablet days 2-4    No facility-administered encounter medications on file as of 10/12/2023.   Allergies  Allergen Reactions   Atorvastatin Other (See Comments)    myalgia   Pravastatin Sodium Other (See Comments)    myalgia    ROS: no chlils. +URI symptoms and myalgias per HPI. Denies wheezing or shortness of breath. No n/v/d, bleeding, bruising, rash. No chest pain, or other concerns except as noted in HPI    PHYSICAL EXAM:  BP 132/80   Pulse 80   Temp 98.7 F (37.1 C) (Tympanic)   Ht 5\' 8"  (1.727 m)   Wt (!) 432 lb (196 kg)   BMI 65.69 kg/m   Pleasant male in no acute distress. No coughing during visit. HEENT: conjunctiva and sclera are clear on the right.  Mild subconjunctival hemorrhage and conjunctival injection at L inferolateral eye. EOMI. L TM partially occluded by cerumen.  Normal R TM and EAC Nasal mucosa is moderately edematous, erythematous with clear mucus. Sinuses are nontender. OP is clear Neck: no lymphadenopathy or mass Heart: regular rate and rhythm Lungs: clear bilaterally Extremities: no edema Skin: normal turgor, no visible rash Neuro: alert and oriented, cranial nerves grossly intact, normal gait Psych: normal mood, affect, grooming  COVID test +    ASSESSMENT/PLAN:  COVID-19 virus infection - risk factors include DM and morbid obesity.  Tx with Paxlovid. no e/o secondary bacterial infection - Plan: nirmatrelvir/ritonavir (PAXLOVID) 20 x 150 MG & 10 x 100MG  TABS  Chest congestion - Plan: POC COVID-19, Influenza A/B  Body aches - Plan: POC  COVID-19, Influenza A/B  Seasonal allergic rhinitis due to pollen - continue montelukast, start flonase, cont zyrtec. No e/o sinus infectoin  Essential hypertension - advised to avoid decongestants, encouraged sinus rinses instead. If using sudafed, to monitor BP closely   Mild subconjunctival hemorrhage on left, reassured. Discussed recommendations re: how long to stay home, when to return to work, to wear mask. Reviewed all OTC meds in detail--some concern as to how he has been taking them.  I spent 40 minutes dedicated to the care of this patient, including pre-visit review of records, face to face time, post-visit ordering of testing and documentation.  For your allergies--continue the montelukast daily. Continue the zyrtec daily. I recommend starting Flonase--take 2 gentle sniffs into each nostril, once daily.  This can take up to 1-2 weeks to have the full effects.  If/when your allergies improve, you can probably stop the zyrtec, but continue the Flonase.  You may be able to drop the flonase dose to 1 spray into each nostril daily if your symptoms are well controlled.  Use it through the spring allergy season.   You are taking too many types of guaifenesin. I recommend switching to a Mucinex 12-hour PLAIN (only guaifenesin), and take this twice daily.  This helps loosen up the mucus and phlegm. Stop all the other versions (liquid, pills, etc). Stop the Delsym tablets, and instead get the 12 hour syrup.  Take it at bedtime. You may take it during the day, if needed for cough. Use your inhaler (albuterol) if needed for any wheezing or shortness of breath (you should have a refill at the pharmacy).  Avoid taking sudafed unless you are monitoring your blood pressure. This can raise your blood pressure. If you have sinus pressure or pain, use sinus rinses--either neti-pot or the [employer] Sinus Rinse Kit.  Use boiled or distilled water (not tap water).  Use this twice daily.  This can help  prevent sinus infection, and help with sinus pain.  Current guidelines state that you need to remain at home until you are fever-free for 24 hours without the use of fever-reducing medications (tylenol, ibuprofen, etc)..  Your respiratory symptoms should be significantly improved. Wear a mask for 5 days upon return to work.  Check with your endocrinologist re: Rybelsus and insurance coverage.

## 2023-10-28 DIAGNOSIS — E538 Deficiency of other specified B group vitamins: Secondary | ICD-10-CM | POA: Diagnosis not present

## 2023-10-28 DIAGNOSIS — R300 Dysuria: Secondary | ICD-10-CM | POA: Diagnosis not present

## 2023-10-28 DIAGNOSIS — I10 Essential (primary) hypertension: Secondary | ICD-10-CM | POA: Diagnosis not present

## 2023-10-28 DIAGNOSIS — E119 Type 2 diabetes mellitus without complications: Secondary | ICD-10-CM | POA: Diagnosis not present

## 2023-10-28 DIAGNOSIS — E559 Vitamin D deficiency, unspecified: Secondary | ICD-10-CM | POA: Diagnosis not present

## 2023-10-28 DIAGNOSIS — E039 Hypothyroidism, unspecified: Secondary | ICD-10-CM | POA: Diagnosis not present

## 2023-11-11 ENCOUNTER — Other Ambulatory Visit: Payer: Self-pay | Admitting: Medical

## 2023-11-11 NOTE — Telephone Encounter (Signed)
 This was discontinued as patient completed course in February

## 2023-11-12 DIAGNOSIS — M170 Bilateral primary osteoarthritis of knee: Secondary | ICD-10-CM | POA: Diagnosis not present

## 2023-11-12 DIAGNOSIS — M1711 Unilateral primary osteoarthritis, right knee: Secondary | ICD-10-CM | POA: Diagnosis not present

## 2023-11-26 DIAGNOSIS — I10 Essential (primary) hypertension: Secondary | ICD-10-CM | POA: Diagnosis not present

## 2023-11-26 DIAGNOSIS — R300 Dysuria: Secondary | ICD-10-CM | POA: Diagnosis not present

## 2023-11-26 DIAGNOSIS — E039 Hypothyroidism, unspecified: Secondary | ICD-10-CM | POA: Diagnosis not present

## 2023-11-26 DIAGNOSIS — N390 Urinary tract infection, site not specified: Secondary | ICD-10-CM | POA: Diagnosis not present

## 2023-11-27 ENCOUNTER — Other Ambulatory Visit: Payer: Self-pay | Admitting: Medical

## 2023-11-27 NOTE — Telephone Encounter (Signed)
 Left message to call for appt

## 2023-11-30 ENCOUNTER — Ambulatory Visit: Admitting: Medical

## 2023-11-30 VITALS — BP 112/62 | HR 85 | Temp 97.8°F | Wt >= 6400 oz

## 2023-11-30 DIAGNOSIS — I10 Essential (primary) hypertension: Secondary | ICD-10-CM | POA: Diagnosis not present

## 2023-11-30 DIAGNOSIS — E1165 Type 2 diabetes mellitus with hyperglycemia: Secondary | ICD-10-CM

## 2023-11-30 DIAGNOSIS — R300 Dysuria: Secondary | ICD-10-CM

## 2023-11-30 DIAGNOSIS — N3000 Acute cystitis without hematuria: Secondary | ICD-10-CM | POA: Diagnosis not present

## 2023-11-30 DIAGNOSIS — E039 Hypothyroidism, unspecified: Secondary | ICD-10-CM

## 2023-11-30 LAB — CBC WITH DIFFERENTIAL/PLATELET
Basophils Absolute: 0 10*3/uL (ref 0.0–0.2)
Basos: 0 %
EOS (ABSOLUTE): 0.1 10*3/uL (ref 0.0–0.4)
Eos: 2 %
Hematocrit: 41.3 % (ref 37.5–51.0)
Hemoglobin: 14 g/dL (ref 13.0–17.7)
Immature Grans (Abs): 0 10*3/uL (ref 0.0–0.1)
Immature Granulocytes: 1 %
Lymphocytes Absolute: 1 10*3/uL (ref 0.7–3.1)
Lymphs: 14 %
MCH: 32.2 pg (ref 26.6–33.0)
MCHC: 33.9 g/dL (ref 31.5–35.7)
MCV: 95 fL (ref 79–97)
Monocytes Absolute: 0.8 10*3/uL (ref 0.1–0.9)
Monocytes: 11 %
Neutrophils Absolute: 4.9 10*3/uL (ref 1.4–7.0)
Neutrophils: 72 %
Platelets: 137 10*3/uL — ABNORMAL LOW (ref 150–450)
RBC: 4.35 x10E6/uL (ref 4.14–5.80)
RDW: 12.8 % (ref 11.6–15.4)
WBC: 6.8 10*3/uL (ref 3.4–10.8)

## 2023-11-30 LAB — POCT URINALYSIS DIP (PROADVANTAGE DEVICE)
Bilirubin, UA: NEGATIVE
Glucose, UA: NEGATIVE mg/dL
Ketones, POC UA: NEGATIVE mg/dL
Nitrite, UA: POSITIVE — AB
Protein Ur, POC: 30 mg/dL — AB
Specific Gravity, Urine: 1.015
Urobilinogen, Ur: 1
pH, UA: 7

## 2023-11-30 NOTE — Telephone Encounter (Signed)
 Pt has appt scheduled 12/11/23

## 2023-11-30 NOTE — Progress Notes (Signed)
 Subjective:  Patrick Baxter is a 53 y.o. male who presents for Chief Complaint  Patient presents with   Acute Visit    UTI/Kidney infection, discoloration and odor, had a UA at Endo office last week. Saturday fever, chills, and body aches. Pain in lower back, stabbing pains at times. Started Wednesday with nausea and diarrhea and then the pressure and urine symptoms. Increased sweating, drinking more water and urinating more often. Currently on cipro  one tablet every 12 hrs.     Here for urinary symptoms since 5-6 days ago.   Has been having fever, chills, body aches, sweats, urinary frequency, pressure in lower abdomen, nausea.  Has had some loose stools.  Last week saw endocrinology, had urinalysis, was put on Cipro  500mg  BID x 10 days which he is taking.  He does feel some better compared to the weekend.  Today no fever, no chills.  He does feel little achy.  His urine has had a bad odor.  No history of kidney stones.  He has seen urology in the past few years regarding elevated PSA and urinary tract infection.  No other aggravating or relieving factors.    No other c/o.  Past Medical History:  Diagnosis Date   Allergy    Anxiety    Back pain    Bilateral chronic knee pain    Chronic headaches    Depression    Dermatographism    Dry skin    Fatigue    Frequent urination    Gout    Hx of echocardiogram 09/07/2019   normal LV function, EF 60-65%, no wall motion abnormality; Dr. Olinda Bertrand   Hyperlipidemia    Hypertension    Joint pain    Knee pain    Obesity    Pre-diabetes    Sleep apnea    Stress    Wears glasses    Current Outpatient Medications on File Prior to Visit  Medication Sig Dispense Refill   allopurinol  (ZYLOPRIM ) 300 MG tablet Take 1 tablet (300 mg total) by mouth daily. 90 tablet 3   B Complex-C (PROBEC-T PO) Take by mouth.     Calcium -Magnesium-Zinc 167-83-8 MG TABS Take 1 tablet by mouth daily.     cetirizine (ZYRTEC) 10 MG tablet Take 10 mg by mouth  daily.     cholecalciferol (VITAMIN D3) 25 MCG (1000 UNIT) tablet Take 1 tablet (1,000 Units total) by mouth daily. 90 tablet 3   Coenzyme Q10 (CO Q 10 PO) Take by mouth.     Continuous Glucose Sensor (FREESTYLE LIBRE 3 SENSOR) MISC 2 each by Does not apply route every 14 (fourteen) days. 2 each 11   FLUoxetine  (PROZAC ) 40 MG capsule TAKE 1 CAPSULE(40 MG) BY MOUTH DAILY 90 capsule 3   levothyroxine  (SYNTHROID ) 25 MCG tablet TAKE 1 TABLET BY MOUTH EVERY DAY BEFORE BREAKFAST 90 tablet 3   lisinopril  (ZESTRIL ) 20 MG tablet TAKE 1 TABLET(20 MG) BY MOUTH DAILY 90 tablet 1   metoprolol  succinate (TOPROL -XL) 50 MG 24 hr tablet TAKE ONE TABLET BY MOUTH WITH OR IMMEDIATELY FOLLOWING A MEAL AS DIRECTED 30 tablet 0   montelukast  (SINGULAIR ) 10 MG tablet TAKE 1 TABLET(10 MG) BY MOUTH AT BEDTIME 90 tablet 3   Multiple Vitamins-Minerals (MULTIVITAMIN WITH MINERALS) tablet Take 1 tablet by mouth daily. 90 tablet 3   naproxen  (NAPROSYN ) 250 MG tablet Take by mouth 3 (three) times daily with meals.     Omega-3 Fatty Acids (SALMON OIL-1000) 200 MG CAPS Take by mouth.  Semaglutide  (RYBELSUS ) 14 MG TABS TAKE 1 TABLET(14 MG) BY MOUTH DAILY 30 tablet 0   sodium chloride  (OCEAN) 0.65 % SOLN nasal spray Place 1 spray into both nostrils as needed for congestion.     albuterol  (VENTOLIN  HFA) 108 (90 Base) MCG/ACT inhaler Inhale 2 puffs into the lungs every 6 (six) hours as needed for wheezing or shortness of breath. (Patient not taking: Reported on 10/12/2023) 8 g 2   colchicine  0.6 MG tablet Take 1 tablet (0.6 mg total) by mouth 2 (two) times daily. For flare up (Patient not taking: Reported on 11/30/2023) 60 tablet 1   guaiFENesin (MUCINEX PO) Take 1 tablet by mouth every 4 (four) hours. (Patient not taking: Reported on 11/30/2023)     meclizine  (ANTIVERT ) 25 MG tablet Take 1 tablet (25 mg total) by mouth 2 (two) times daily. (Patient not taking: Reported on 11/30/2023) 30 tablet 0   nitroGLYCERIN  (NITROSTAT ) 0.4 MG SL  tablet Place 1 tablet (0.4 mg total) under the tongue every 5 (five) minutes as needed for chest pain. 30 tablet 1   phenylephrine (SUDAFED PE) 10 MG TABS tablet Take 10 mg by mouth every 4 (four) hours as needed. (Patient not taking: Reported on 11/30/2023)     No current facility-administered medications on file prior to visit.    The following portions of the patient's history were reviewed and updated as appropriate: allergies, current medications, past family history, past medical history, past social history, past surgical history and problem list.  ROS Otherwise as in subjective above    Objective: BP 112/62   Pulse 85   Temp 97.8 F (36.6 C)   Wt (!) 420 lb 6.4 oz (190.7 kg)   SpO2 96%   BMI 63.92 kg/m   Wt Readings from Last 3 Encounters:  11/30/23 (!) 420 lb 6.4 oz (190.7 kg)  10/12/23 (!) 432 lb (196 kg)  08/27/23 (!) 400 lb (181.4 kg)    General appearance: alert, no distress, well developed, well nourished No CVA tenderness Abdomen nontender with no mass organomegaly   Assessment: Encounter Diagnoses  Name Primary?   Dysuria Yes   Acute cystitis without hematuria    Essential hypertension    Type 2 diabetes mellitus with hyperglycemia, without long-term current use of insulin  (HCC)    Hypothyroidism, unspecified type      Plan: Compared to the weekend he is significantly improved.  CBC is low today.  Advise he finish out the Cipro  antibiotic, increase water intake and drink some lemon in the water over the next few days.  We will request prior urology records.  Advised that he would need a urology follow-up if he gets a recurrent urinary tract infection in the coming months  If he does not have improvement or complete resolution of symptoms in the next week we may need to consider CT abdomen pelvis.  Monitor your blood pressures at home.  Goal is 120/70  If you lose additional weight, your blood pressure may stop the drop too low such as any  measurement under 105/60.  If you are getting low readings regularly and feeling lightheaded we may need to go ahead and back off some of your blood pressure medication.   Patrick "[name]" was seen today for acute visit.  Diagnoses and all orders for this visit:  Dysuria -     POCT Urinalysis DIP (Proadvantage Device) -     Cancel: Urine Culture -     CBC with Differential/Platelet  Acute cystitis without  hematuria -     Cancel: Urine Culture -     CBC with Differential/Platelet  Essential hypertension  Type 2 diabetes mellitus with hyperglycemia, without long-term current use of insulin  (HCC)  Hypothyroidism, unspecified type    Follow up: pending labs

## 2023-11-30 NOTE — Patient Instructions (Addendum)
 Urinary tract infection  Finish out the Cipro  antibiotic, increase water intake and drink some lemon in the water over the next few days.  We will request prior urology records.  Advised that he would need a urology follow-up if he gets a recurrent urinary tract infection in the coming months  If he does not have improvement or complete resolution of symptoms in the next week we may need to consider CT abdomen pelvis.   Monitor your blood pressures at home.  Goal is 120/70  If you lose additional weight, your blood pressure may stop the drop too low such as any measurement under 105/60.  If you are getting low readings regularly and feeling lightheaded we may need to go ahead and back off some of your blood pressure medication.

## 2023-12-01 ENCOUNTER — Ambulatory Visit: Payer: Self-pay | Admitting: Medical

## 2023-12-01 NOTE — Progress Notes (Signed)
 Results sent through MyChart

## 2023-12-07 ENCOUNTER — Other Ambulatory Visit: Payer: Self-pay | Admitting: Medical

## 2023-12-08 NOTE — Telephone Encounter (Signed)
 Has CPE coming up soon

## 2023-12-11 ENCOUNTER — Encounter: Payer: Self-pay | Admitting: Medical

## 2023-12-11 ENCOUNTER — Ambulatory Visit: Admitting: Medical

## 2023-12-11 VITALS — BP 128/80 | HR 76 | Ht 68.5 in | Wt >= 6400 oz

## 2023-12-11 DIAGNOSIS — Z125 Encounter for screening for malignant neoplasm of prostate: Secondary | ICD-10-CM | POA: Diagnosis not present

## 2023-12-11 DIAGNOSIS — E559 Vitamin D deficiency, unspecified: Secondary | ICD-10-CM | POA: Diagnosis not present

## 2023-12-11 DIAGNOSIS — E039 Hypothyroidism, unspecified: Secondary | ICD-10-CM | POA: Diagnosis not present

## 2023-12-11 DIAGNOSIS — E1165 Type 2 diabetes mellitus with hyperglycemia: Secondary | ICD-10-CM

## 2023-12-11 DIAGNOSIS — Z0000 Encounter for general adult medical examination without abnormal findings: Secondary | ICD-10-CM

## 2023-12-11 DIAGNOSIS — M609 Myositis, unspecified: Secondary | ICD-10-CM

## 2023-12-11 DIAGNOSIS — I10 Essential (primary) hypertension: Secondary | ICD-10-CM

## 2023-12-11 DIAGNOSIS — M545 Low back pain, unspecified: Secondary | ICD-10-CM

## 2023-12-11 DIAGNOSIS — R8290 Unspecified abnormal findings in urine: Secondary | ICD-10-CM | POA: Diagnosis not present

## 2023-12-11 DIAGNOSIS — Z113 Encounter for screening for infections with a predominantly sexual mode of transmission: Secondary | ICD-10-CM | POA: Diagnosis not present

## 2023-12-11 DIAGNOSIS — Z6841 Body Mass Index (BMI) 40.0 and over, adult: Secondary | ICD-10-CM

## 2023-12-11 DIAGNOSIS — Z7185 Encounter for immunization safety counseling: Secondary | ICD-10-CM | POA: Diagnosis not present

## 2023-12-11 DIAGNOSIS — G4733 Obstructive sleep apnea (adult) (pediatric): Secondary | ICD-10-CM

## 2023-12-11 DIAGNOSIS — R300 Dysuria: Secondary | ICD-10-CM | POA: Diagnosis not present

## 2023-12-11 DIAGNOSIS — B356 Tinea cruris: Secondary | ICD-10-CM | POA: Insufficient documentation

## 2023-12-11 DIAGNOSIS — R718 Other abnormality of red blood cells: Secondary | ICD-10-CM | POA: Diagnosis not present

## 2023-12-11 DIAGNOSIS — Z1211 Encounter for screening for malignant neoplasm of colon: Secondary | ICD-10-CM | POA: Diagnosis not present

## 2023-12-11 DIAGNOSIS — F32A Depression, unspecified: Secondary | ICD-10-CM

## 2023-12-11 DIAGNOSIS — F419 Anxiety disorder, unspecified: Secondary | ICD-10-CM

## 2023-12-11 DIAGNOSIS — Z1159 Encounter for screening for other viral diseases: Secondary | ICD-10-CM | POA: Diagnosis not present

## 2023-12-11 DIAGNOSIS — Z1389 Encounter for screening for other disorder: Secondary | ICD-10-CM | POA: Insufficient documentation

## 2023-12-11 DIAGNOSIS — M1A9XX0 Chronic gout, unspecified, without tophus (tophi): Secondary | ICD-10-CM | POA: Diagnosis not present

## 2023-12-11 DIAGNOSIS — E538 Deficiency of other specified B group vitamins: Secondary | ICD-10-CM

## 2023-12-11 DIAGNOSIS — G8929 Other chronic pain: Secondary | ICD-10-CM

## 2023-12-11 LAB — POCT URINALYSIS DIP (PROADVANTAGE DEVICE)
Bilirubin, UA: NEGATIVE
Glucose, UA: NEGATIVE mg/dL
Ketones, POC UA: NEGATIVE mg/dL
Nitrite, UA: NEGATIVE
Specific Gravity, Urine: 1.015
Urobilinogen, Ur: NEGATIVE
pH, UA: 6 (ref 5.0–8.0)

## 2023-12-11 MED ORDER — NYSTATIN 100000 UNIT/GM EX POWD: 1.0000 | Freq: Two times a day (BID) | CUTANEOUS | 1 refills | Status: DC

## 2023-12-11 MED ORDER — CLOTRIMAZOLE-BETAMETHASONE 1-0.05 % EX CREA: 1.0000 | TOPICAL_CREAM | Freq: Every day | CUTANEOUS | 0 refills | Status: DC

## 2023-12-11 MED ORDER — KETOCONAZOLE 200 MG PO TABS: 200.0000 mg | ORAL_TABLET | Freq: Every day | ORAL | 0 refills | Status: DC

## 2023-12-11 NOTE — Progress Notes (Signed)
 Subjective:   HPI  Patrick Baxter is a 53 y.o. male who presents for Chief Complaint  Patient presents with   Annual Exam    Fasting physical, discuss UTI, doesn't think it has went away. Has a rash in his groin area.    Patient Care Team: Steele Stracener, Newt Barefoot as PCP - General (Family Medicine) [facility], [facility] Sees dentist Sees eye doctor Dr. Olinda Bertrand, cardiology [facility], Dr. Arvil Birks, Dr. Mort Ards,  Dr. Jerris More endocrinology Dr. Secundino Dach, [facility]    Concerns: He is compliant with his medications for blood pressure diabetes.  No recent gout problems.  Compliant with thyroid medicine.    He still never got around to doing a Cologuard test.  Last urology visit a year ago for PSA abnormality, but things had improved on updated labs  Currently has odor in urine.   Urine is less cloudy.   No burning with urination, no urinary frequency.  No urgency.  No belly or new back pain, no fever, no body aches or chills.  No penile discharge.   No blood in urine or semen.  Has recently completed bactrim and cipro  antibiotics.   He notes ongoing rash in groin since covid, quite red, sometimes itchy.  Sometimes scratches to the point of bleeding.   He just saw endocrinology within the last 2 months.  Hemoglobin A1c reportedly under 6.5.  He is on 3 mg Rybelsus  currently and getting ready go up to the 7 mg  Reviewed their medical, surgical, family, social, medication, and allergy history and updated chart as appropriate.  Past Medical History:  Diagnosis Date   Allergy    Anxiety    Back pain    Bilateral chronic knee pain    Chronic headaches    Depression    Dermatographism    Dry skin    Fatigue    Frequent urination    Gout    Hx of echocardiogram 09/07/2019   normal LV function, EF 60-65%, no wall motion abnormality; Dr. Olinda Bertrand   Hyperlipidemia    Hypertension    Joint pain    Knee pain    Obesity     Pre-diabetes    Sleep apnea    Stress    Wears glasses     Past Surgical History:  Procedure Laterality Date   IR INJECT/THERA/INC NEEDLE/CATH/PLC EPI/LUMB/SAC [facility]  09/14/2020   IR INJECT/THERA/INC NEEDLE/CATH/PLC EPI/LUMB/SAC W/IMG  09/14/2020   KNEE SURGERY  11/2006   right knee arthroscopy and meniscal repair, Dr. Arnett Lanius    Family History  Problem Relation Age of Onset   Alzheimer's disease Mother    Heart disease Mother        60, angioplasty, CAD   Cancer Mother        breast   Stroke Mother    Hypertension Mother    Hyperlipidemia Mother    Heart disease Father        angina   Arthritis Father    Cancer Maternal Grandmother        leukemia   Diabetes Paternal Grandmother      Current Outpatient Medications:    allopurinol  (ZYLOPRIM ) 300 MG tablet, TAKE 1 TABLET(300 MG) BY MOUTH DAILY, Disp: 90 tablet, Rfl: 0   B Complex-C (PROBEC-T PO), Take by mouth., Disp: , Rfl:    Calcium -Magnesium-Zinc 167-83-8 MG TABS, Take 1 tablet by mouth daily., Disp: , Rfl:    cholecalciferol (VITAMIN D3) 25 MCG (1000  UNIT) tablet, Take 1 tablet (1,000 Units total) by mouth daily., Disp: 90 tablet, Rfl: 3   clotrimazole-betamethasone (LOTRISONE) cream, Apply 1 Application topically daily., Disp: 30 g, Rfl: 0   Coenzyme Q10 (CO Q 10 PO), Take by mouth., Disp: , Rfl:    FLUoxetine  (PROZAC ) 40 MG capsule, TAKE 1 CAPSULE(40 MG) BY MOUTH DAILY, Disp: 90 capsule, Rfl: 0   ketoconazole (NIZORAL) 200 MG tablet, Take 1 tablet (200 mg total) by mouth daily. X 5 days, can repeat in a week or 2 if not much improved, Disp: 10 tablet, Rfl: 0   levothyroxine  (SYNTHROID ) 25 MCG tablet, TAKE 1 TABLET BY MOUTH EVERY DAY BEFORE BREAKFAST, Disp: 90 tablet, Rfl: 3   lisinopril  (ZESTRIL ) 20 MG tablet, TAKE 1 TABLET(20 MG) BY MOUTH DAILY, Disp: 90 tablet, Rfl: 1   metoprolol  succinate (TOPROL -XL) 50 MG 24 hr tablet, TAKE ONE TABLET BY MOUTH WITH OR IMMEDIATELY FOLLOWING A MEAL AS DIRECTED, Disp: 30 tablet, Rfl: 0    montelukast  (SINGULAIR ) 10 MG tablet, TAKE 1 TABLET(10 MG) BY MOUTH AT BEDTIME, Disp: 90 tablet, Rfl: 3   Multiple Vitamins-Minerals (MULTIVITAMIN WITH MINERALS) tablet, Take 1 tablet by mouth daily., Disp: 90 tablet, Rfl: 3   naproxen  (NAPROSYN ) 250 MG tablet, Take by mouth 3 (three) times daily with meals., Disp: , Rfl:    nystatin (MYCOSTATIN/NYSTOP) powder, Apply 1 Application topically 2 (two) times daily., Disp: 30 g, Rfl: 1   Omega-3 Fatty Acids (SALMON OIL-1000) 200 MG CAPS, Take by mouth., Disp: , Rfl:    Semaglutide  (RYBELSUS ) 3 MG TABS, Take 3 mg by mouth daily., Disp: , Rfl:    sodium chloride  (OCEAN) 0.65 % SOLN nasal spray, Place 1 spray into both nostrils as needed for congestion., Disp: , Rfl:    albuterol  (VENTOLIN  HFA) 108 (90 Base) MCG/ACT inhaler, Inhale 2 puffs into the lungs every 6 (six) hours as needed for wheezing or shortness of breath. (Patient not taking: Reported on 12/11/2023), Disp: 8 g, Rfl: 2   cetirizine (ZYRTEC) 10 MG tablet, Take 10 mg by mouth daily. (Patient not taking: Reported on 12/11/2023), Disp: , Rfl:    colchicine  0.6 MG tablet, Take 1 tablet (0.6 mg total) by mouth 2 (two) times daily. For flare up (Patient not taking: Reported on 11/30/2023), Disp: 60 tablet, Rfl: 1   Continuous Glucose Sensor (FREESTYLE LIBRE 3 SENSOR) MISC, 2 each by Does not apply route every 14 (fourteen) days. (Patient not taking: Reported on 12/11/2023), Disp: 2 each, Rfl: 11   guaiFENesin (MUCINEX PO), Take 1 tablet by mouth every 4 (four) hours. (Patient not taking: Reported on 12/11/2023), Disp: , Rfl:    meclizine  (ANTIVERT ) 25 MG tablet, Take 1 tablet (25 mg total) by mouth 2 (two) times daily. (Patient not taking: Reported on 10/12/2023), Disp: 30 tablet, Rfl: 0   nitroGLYCERIN  (NITROSTAT ) 0.4 MG SL tablet, Place 1 tablet (0.4 mg total) under the tongue every 5 (five) minutes as needed for chest pain., Disp: 30 tablet, Rfl: 1   phenylephrine (SUDAFED PE) 10 MG TABS tablet, Take 10 mg  by mouth every 4 (four) hours as needed. (Patient not taking: Reported on 12/11/2023), Disp: , Rfl:    Allergies  Allergen Reactions   Atorvastatin  Other (See Comments)    myalgia   Pravastatin  Sodium Other (See Comments)    myalgia    Review of Systems  Constitutional:  Negative for chills, fever, malaise/fatigue and weight loss.  HENT:  Negative for congestion, ear pain, hearing loss,  sore throat and tinnitus.   Eyes:  Negative for blurred vision, pain and redness.  Respiratory:  Negative for cough, hemoptysis and shortness of breath.   Cardiovascular:  Negative for chest pain, palpitations, orthopnea, claudication and leg swelling.  Gastrointestinal:  Negative for abdominal pain, blood in stool, constipation, diarrhea, nausea and vomiting.  Genitourinary:  Positive for dysuria. Negative for flank pain, frequency, hematuria and urgency.  Musculoskeletal:  Negative for falls, joint pain and myalgias.  Skin:  Positive for itching and rash.  Neurological:  Negative for dizziness, tingling, speech change, weakness and headaches.  Endo/Heme/Allergies:  Negative for polydipsia. Does not bruise/bleed easily.  Psychiatric/Behavioral:  Negative for depression and memory loss. The patient is not nervous/anxious and does not have insomnia.         12/11/2023    9:23 AM 12/01/2022   10:14 AM 04/01/2022   10:05 AM 11/26/2021    9:01 AM 04/23/2021    8:35 AM  Depression screen PHQ 2/9  Decreased Interest 1 0 0 0 3  Down, Depressed, Hopeless 1 0 0 0 3  PHQ - 2 Score 2 0 0 0 6  Altered sleeping 3    0  Tired, decreased energy 3    2  Change in appetite 1    3  Feeling bad or failure about yourself  0    1  Trouble concentrating 0    1  Moving slowly or fidgety/restless 0    0  Suicidal thoughts 0    0  PHQ-9 Score 9    13  Difficult doing work/chores Somewhat difficult    Not difficult at all        Objective:  BP 128/80   Pulse 76   Ht 5' 8.5" (1.74 m)   Wt (!) 423 lb 9.6 oz  (192.1 kg)   SpO2 97%   BMI 63.46 kg/m   Wt Readings from Last 3 Encounters:  12/11/23 (!) 423 lb 9.6 oz (192.1 kg)  11/30/23 (!) 420 lb 6.4 oz (190.7 kg)  10/12/23 (!) 432 lb (196 kg)   BP Readings from Last 3 Encounters:  12/11/23 128/80  11/30/23 112/62  10/12/23 132/80    General appearance: alert, no distress, WD/WN, Caucasian male Skin:  skin tags circumferentially around the neck, and a few skin tags under left eyelid, widespread erythema of upper thighs bilat, adjacent to and including scrotum. HEENT: normocephalic, conjunctiva/corneas normal, sclerae anicteric, PERRLA, EOMi Neck: supple, no lymphadenopathy, no thyromegaly, no masses, normal ROM, no bruits Chest: non tender, normal shape and expansion Heart: RRR, normal S1, S2, no murmurs Lungs: CTA bilaterally, no wheezes, rhonchi, or rales Abdomen: +bs, soft, non tender, non distended, no masses, no hepatomegaly, no splenomegaly, no bruits Back: non tender, normal ROM, no scoliosis Musculoskeletal: upper extremities non tender, no obvious deformity, normal ROM throughout, lower extremities non tender, no obvious deformity, normal ROM throughout Extremities: no edema, no cyanosis, no clubbing Pulses: 2+ symmetric, upper and lower extremities, normal cap refill Neurological: alert, oriented x 3, CN2-12 intact, strength normal upper extremities and lower extremities, sensation normal throughout, DTRs 2+ throughout, no cerebellar signs, gait normal Psychiatric: normal affect, behavior normal, pleasant  GU/rectal -deferred   Diabetic Foot Exam - Simple   Simple Foot Form Diabetic Foot exam was performed with the following findings: Yes 12/11/2023  9:42 AM  Visual Inspection No deformities, no ulcerations, no other skin breakdown bilaterally: Yes Sensation Testing Intact to touch and monofilament testing bilaterally: Yes Pulse Check Posterior  Tibialis and Dorsalis pulse intact bilaterally: Yes Comments        Assessment and Plan :   Encounter Diagnoses  Name Primary?   Encounter for health maintenance examination in adult Yes   Essential hypertension    Hypothyroidism, unspecified type    Type 2 diabetes mellitus with hyperglycemia, without long-term current use of insulin  (HCC)    Screening for prostate cancer    Vitamin D  deficiency    Vaccine counseling    Anxiety and depression    B12 deficiency    BMI 60.0-69.9, adult (HCC)    Chronic bilateral low back pain without sciatica    OSA (obstructive sleep apnea)    Screening for colon cancer    Screening for hematuria or proteinuria    Screen for STD (sexually transmitted disease)    Chronic gout without tophus, unspecified cause, unspecified site    Tinea cruris    Dysuria    Statin-induced myositis     This visit was a preventative care visit, also known as wellness visit or routine physical.   Topics typically include healthy lifestyle, diet, exercise, preventative care, vaccinations, sick and well care, proper use of emergency dept and after hours care, as well as other concerns.     Recommendations: Continue to return yearly for your annual wellness and preventative care visits.  This gives us  a chance to discuss healthy lifestyle, exercise, vaccinations, review your chart record, and perform screenings where appropriate.  I recommend you see your eye doctor yearly for routine vision care.  I recommend you see your dentist yearly for routine dental care including hygiene visits twice yearly.   Vaccination recommendations were reviewed Immunization History  Administered Date(s) Administered   Influenza,inj,Quad PF,6+ Mos 04/20/2014, 09/20/2015, 06/24/2016, 04/14/2017, 04/23/2021, 04/01/2022   Moderna Covid-19 Vaccine Bivalent Booster 32yrs & up 05/23/2021   Moderna Sars-Covid-2 Vaccination 06/12/2020   PFIZER(Purple Top)SARS-COV-2 Vaccination 09/28/2019, 10/19/2019   Pneumococcal Polysaccharide-23 11/26/2021    Tdap 10/28/2016   Zoster Recombinant(Shingrix ) 12/01/2022, 02/04/2023    Advised yearly flu shot   Screening for cancer: Colon cancer screening: He is past due for colon cancer screening.  He does not want to do a colonoscopy.  I will put in a new order for Cologuard today.  We discussed PSA, prostate exam, and prostate cancer screening risks/benefits.   History of abnormal PSA.  Saw urology about a year ago.  Updated lab today.  Skin cancer screening: Check your skin regularly for new changes, growing lesions, or other lesions of concern Come in for evaluation if you have skin lesions of concern.  Lung cancer screening: If you have a greater than 20 pack year history of tobacco use, then you may qualify for lung cancer screening with a chest CT scan.   Please call your insurance company to inquire about coverage for this test.  We currently don't have screenings for other cancers besides breast, cervical, colon, and lung cancers.  If you have a strong family history of cancer or have other cancer screening concerns, please let me know.    Bone health: Get at least 150 minutes of aerobic exercise weekly Get weight bearing exercise at least once weekly Bone density test:  A bone density test is an imaging test that uses a type of X-ray to measure the amount of calcium  and other minerals in your bones. The test may be used to diagnose or screen you for a condition that causes weak or thin bones (osteoporosis), predict your risk for  a broken bone (fracture), or determine how well your osteoporosis treatment is working. The bone density test is recommended for females 65 and older, or females or males <65 if certain risk factors such as thyroid disease, long term use of steroids such as for asthma or rheumatological issues, vitamin D  deficiency, estrogen deficiency, family history of osteoporosis, self or family history of fragility fracture in first degree relative.    Heart  health: Get at least 150 minutes of aerobic exercise weekly Limit alcohol It is important to maintain a healthy blood pressure and healthy cholesterol numbers  Heart disease screening: Screening for heart disease includes screening for blood pressure, fasting lipids, glucose/diabetes screening, BMI height to weight ratio, reviewed of smoking status, physical activity, and diet.    Goals include blood pressure 120/80 or less, maintaining a healthy lipid/cholesterol profile, preventing diabetes or keeping diabetes numbers under good control, not smoking or using tobacco products, exercising most days per week or at least 150 minutes per week of exercise, and eating healthy variety of fruits and vegetables, healthy oils, and avoiding unhealthy food choices like fried food, fast food, high sugar and high cholesterol foods.    Other tests may possibly include EKG test, CT coronary calcium  score, echocardiogram, exercise treadmill stress test.   I reviewed your 2021 echo and stress test of your heart   Medical care options: I recommend you continue to seek care here first for routine care.  We try really hard to have available appointments Monday through Friday daytime hours for sick visits, acute visits, and physicals.  Urgent care should be used for after hours and weekends for significant issues that cannot wait till the next day.  The emergency department should be used for significant potentially life-threatening emergencies.  The emergency department is expensive, can often have long wait times for less significant concerns, so try to utilize primary care, urgent care, or telemedicine when possible to avoid unnecessary trips to the emergency department.  Virtual visits and telemedicine have been introduced since the pandemic started in 2020, and can be convenient ways to receive medical care.  We offer virtual appointments as well to assist you in a variety of options to seek medical  care.   Advanced Directives: I recommend you consider completing a Health Care Power of Attorney and Living Will.   These documents respect your wishes and help alleviate burdens on your loved ones if you were to become terminally ill or be in a position to need those documents enforced.    You can complete Advanced Directives yourself, have them notarized, then have copies made for our office, for you and for anybody you feel should have them in safe keeping.  Or, you can have an attorney prepare these documents.   If you haven't updated your Last Will and Testament in a while, it may be worthwhile having an attorney prepare these documents together and save on some costs.       Separate significant issues discussed: Routine labs today  OSA-on CPAP.  His CPAP machine is seeming to get too high.  Will refer for updated sleep study in case he needs to get a new CPAP set up.  Morbid obesity, BMI greater than 60.  Encouraged exercise regular.  Continue efforts to lose weight.  Follow-up with endocrinology in we will be increasing Rybelsus  soon.  Dysuria-he does recently completed Bactrim and Cipro  antibiotics.  Still having symptoms.  Urinalysis reviewed. F/u pending labs, culture  Diabetes type 2-currently managed  by endocrinology.  Currently on Rybelsus  3 mg daily.  He is supposed to be going up to 7 mg soon.  He notes a recent hemoglobin A1c at goal.  Hypothyroidism-labs today.  Continue levothyroxine  25 mcg daily  Hypertension-continue lisinopril  20 mg daily, Toprol -XL 50 mg daily  Hyperlipidemia-not currently on medication.  Prior statin induced myositis.  Widespread tinea cruris in the groin region-begin nystatin  powder for 10 days and you can continue this longer., Lotrisone  cream for 7 to 10 days, begin ketoconazole  oral daily for 5 days.  If not much improved within the next 2 weeks then let me know or recheck  Elevated uric acid-updated labs today, continue allopurinol  300 mg  daily, colchicine  as needed  History of anxiety depression-continue fluoxetine  Prozac  40 mg daily  Continue routine allergy regimen including Singulair  10 mg daily, Zyrtec 10 mg daily   Patrick "[name]" was seen today for annual exam.  Diagnoses and all orders for this visit:  Encounter for health maintenance examination in adult -     Microalbumin/Creatinine Ratio, Urine -     Cancel: Urinalysis, Routine w reflex microscopic -     TSH + free T4 -     Lipid panel -     Comprehensive metabolic panel with GFR -     Vitamin B12 -     RPR+HIV+GC+CT Panel -     Hepatitis C antibody -     Hepatitis B surface antigen -     PSA, total and free -     Cologuard -     Uric acid -     POCT Urinalysis DIP (Proadvantage Device)  Essential hypertension -     Lipid panel  Hypothyroidism, unspecified type -     TSH + free T4  Type 2 diabetes mellitus with hyperglycemia, without long-term current use of insulin  (HCC)  Screening for prostate cancer -     PSA, total and free  Vitamin D  deficiency  Vaccine counseling  Anxiety and depression  B12 deficiency  BMI 60.0-69.9, adult (HCC)  Chronic bilateral low back pain without sciatica  OSA (obstructive sleep apnea)  Screening for colon cancer -     Cologuard  Screening for hematuria or proteinuria -     Microalbumin/Creatinine Ratio, Urine -     Cancel: Urinalysis, Routine w reflex microscopic  Screen for STD (sexually transmitted disease) -     RPR+HIV+GC+CT Panel -     Hepatitis C antibody -     Hepatitis B surface antigen  Chronic gout without tophus, unspecified cause, unspecified site -     Uric acid  Tinea cruris  Dysuria -     POCT Urinalysis DIP (Proadvantage Device) -     Urine Culture  Statin-induced myositis  Other orders -     clotrimazole -betamethasone  (LOTRISONE ) cream; Apply 1 Application topically daily. -     ketoconazole  (NIZORAL ) 200 MG tablet; Take 1 tablet (200 mg total) by mouth daily. X 5 days,  can repeat in a week or 2 if not much improved -     nystatin  (MYCOSTATIN /NYSTOP ) powder; Apply 1 Application topically 2 (two) times daily.    Follow-up pending labs, yearly for physical

## 2023-12-11 NOTE — Progress Notes (Signed)
 Faxed over order [facility]

## 2023-12-12 ENCOUNTER — Other Ambulatory Visit: Payer: Self-pay | Admitting: Medical

## 2023-12-12 DIAGNOSIS — E038 Other specified hypothyroidism: Secondary | ICD-10-CM

## 2023-12-12 LAB — URIC ACID: Uric Acid: 5.8 mg/dL (ref 3.8–8.4)

## 2023-12-13 LAB — URINE CULTURE

## 2023-12-13 LAB — MICROALBUMIN / CREATININE URINE RATIO

## 2023-12-14 ENCOUNTER — Ambulatory Visit: Payer: Self-pay | Admitting: Medical

## 2023-12-14 NOTE — Progress Notes (Signed)
 Results sent through MyChart

## 2023-12-15 NOTE — Progress Notes (Signed)
 Why was the microalbumin cancel?  Did we not get urine at all because we need that for the chlamydia and gonorrhea test as well

## 2023-12-16 LAB — COMPREHENSIVE METABOLIC PANEL WITH GFR
ALT: 40 IU/L (ref 0–44)
AST: 33 IU/L (ref 0–40)
Albumin: 4.1 g/dL (ref 3.8–4.9)
Alkaline Phosphatase: 111 IU/L (ref 44–121)
BUN/Creatinine Ratio: 22 — ABNORMAL HIGH (ref 9–20)
BUN: 18 mg/dL (ref 6–24)
Bilirubin Total: 0.5 mg/dL (ref 0.0–1.2)
CO2: 22 mmol/L (ref 20–29)
Calcium: 9.1 mg/dL (ref 8.7–10.2)
Chloride: 99 mmol/L (ref 96–106)
Creatinine, Ser: 0.81 mg/dL (ref 0.76–1.27)
Globulin, Total: 2.8 g/dL (ref 1.5–4.5)
Glucose: 132 mg/dL — ABNORMAL HIGH (ref 70–99)
Potassium: 4.4 mmol/L (ref 3.5–5.2)
Sodium: 136 mmol/L (ref 134–144)
Total Protein: 6.9 g/dL (ref 6.0–8.5)
eGFR: 105 mL/min/{1.73_m2} (ref >59)

## 2023-12-16 LAB — URINALYSIS, ROUTINE W REFLEX MICROSCOPIC

## 2023-12-16 LAB — LIPID PANEL
Chol/HDL Ratio: 4.7 ratio (ref 0.0–5.0)
Cholesterol, Total: 189 mg/dL (ref 100–199)
HDL: 40 mg/dL (ref >39)
LDL Chol Calc (NIH): 124 mg/dL — ABNORMAL HIGH (ref 0–99)
Triglycerides: 142 mg/dL (ref 0–149)
VLDL Cholesterol Cal: 25 mg/dL (ref 5–40)

## 2023-12-16 LAB — PSA, TOTAL AND FREE
PSA, Free Pct: 4.4 %
PSA, Free: 0.04 ng/mL
Prostate Specific Ag, Serum: 0.9 ng/mL (ref 0.0–4.0)

## 2023-12-16 LAB — MICROALBUMIN / CREATININE URINE RATIO

## 2023-12-16 LAB — VITAMIN B12: Vitamin B-12: 341 pg/mL (ref 232–1245)

## 2023-12-16 LAB — HEPATITIS C ANTIBODY: Hep C Virus Ab: NONREACTIVE

## 2023-12-16 LAB — TSH+FREE T4
Free T4: 1.19 ng/dL (ref 0.82–1.77)
TSH: 2.8 u[IU]/mL (ref 0.450–4.500)

## 2023-12-16 LAB — RPR+HIV+GC+CT PANEL
HIV Screen 4th Generation wRfx: NONREACTIVE
RPR Ser Ql: NONREACTIVE

## 2023-12-16 LAB — HEPATITIS B SURFACE ANTIGEN: Hepatitis B Surface Ag: NEGATIVE

## 2023-12-16 NOTE — Progress Notes (Signed)
 Results sent through MyChart

## 2023-12-27 ENCOUNTER — Other Ambulatory Visit: Payer: Self-pay | Admitting: Medical

## 2023-12-29 ENCOUNTER — Other Ambulatory Visit: Payer: Self-pay | Admitting: Medical

## 2023-12-30 ENCOUNTER — Other Ambulatory Visit: Payer: Self-pay | Admitting: Medical

## 2023-12-30 ENCOUNTER — Ambulatory Visit: Admitting: Family Medicine

## 2024-01-01 ENCOUNTER — Telehealth: Payer: Self-pay | Admitting: Medical

## 2024-01-01 ENCOUNTER — Telehealth: Admitting: Nurse Practitioner

## 2024-01-01 VITALS — Wt >= 6400 oz

## 2024-01-01 DIAGNOSIS — M5441 Lumbago with sciatica, right side: Secondary | ICD-10-CM | POA: Diagnosis not present

## 2024-01-01 DIAGNOSIS — M5442 Lumbago with sciatica, left side: Secondary | ICD-10-CM

## 2024-01-01 MED ORDER — HYDROCODONE-ACETAMINOPHEN 5-325 MG PO TABS: 1.0000 | ORAL_TABLET | Freq: Four times a day (QID) | ORAL | 0 refills | Status: DC | PRN

## 2024-01-01 MED ORDER — PREDNISONE 20 MG PO TABS: 20.0000 mg | ORAL_TABLET | Freq: Every day | ORAL | 0 refills | Status: DC

## 2024-01-01 MED ORDER — CYCLOBENZAPRINE HCL 10 MG PO TABS
10.0000 mg | ORAL_TABLET | Freq: Three times a day (TID) | ORAL | 3 refills | Status: AC | PRN
Start: 2024-01-01 — End: ?

## 2024-01-01 NOTE — Progress Notes (Signed)
 Virtual Visit Encounter mychart visit.   I connected with  Gayleen Kawasaki on 01/01/24 at  2:00 PM EDT by secure video and audio telemedicine application. I verified that I am speaking with the correct person using two identifiers.   I introduced myself as a Publishing rights manager with the practice. The limitations of evaluation and management by telemedicine discussed with the patient and the availability of in person appointments. The patient expressed verbal understanding and consent to proceed.  Participating parties in this visit include: Myself and patient  The patient is: Patient Location: Home I am: Provider Location: Office/Clinic Subjective:    CC and HPI:   History of Present Illness Patrick Baxter is a 53 year old male with a history of lower back issues who presents with acute exacerbation of back pain radiating to the hips bilaterally.  He has been experiencing back pain that began on Monday night and has since moved into his hip area. He has a history of lower back issues and previously received injections in 2022. Typically, flare-ups resolve with rest, ice, heat, and Aleve , but this episode has been more severe.  He was able to work on Tuesday and part of Wednesday, but the pain intensified by Wednesday afternoon. On Thursday, the pain became so intense that he was unable to get out of bed despite multiple attempts. He has been taking extra Aleve  and using Voltaren, but is unsure of its effectiveness.  The pain is primarily in the lower back with some radiation into the hips, but not down the legs. He describes the pain as sharp and intense, particularly when trying to stand or sit. Lying in bed provides some relief, and he has been able to get up four times today with less agony.  He mentions a potential trigger for the pain, including helping his father out of a chair on Saturday, which was a struggle due to his fathers inability to stand independently, and nearly falling on  Monday with a twisting motion.  He has not been taking his Rybelsus  due to difficulty with bowel movements, which he associates with increased back pain. His last A1c was 6.8, and he does not regularly check his blood sugars. He has a history of using Flexeril  for upper back and shoulder pain, which was effective in the past.  . Past medical history, Surgical history, Family history not pertinant except as noted below, Social history, Allergies, and medications have been entered into the medical record, reviewed, and corrections made.   Review of Systems:  All review of systems negative except what is listed in the HPI  Objective:    Alert and oriented x 4 Speaking in clear sentences with no shortness of breath. Laying in bed on his back. Unable to lift up without severe pain.   Impression and Recommendations:    Problem List Items Addressed This Visit     Acute bilateral low back pain with bilateral sciatica - Primary   Chronic low back pain with current exacerbation beginning Monday night, intensified with pain radiating to the hips, severe enough to prevent ambulation. Triggered by lifting his father and a near fall. Pain localized to lower back with muscle spasms. Cyclobenzaprine  (Flexeril ) discussed for muscle spasms, prednisone for inflammation with potential for transient hyperglycemia. Vicodin considered for acute pain, with insurance coverage issues; GoodRx suggested. Gabapentin considered if Vicodin unavailable due to prior authorization. - Prescribe cyclobenzaprine  (Flexeril ) for muscle spasms - Prescribe prednisone for inflammation - Prescribe Vicodin for acute pain management -  Consider gabapentin if Vicodin is not available - [facility] - Advise him to contact the office if [facility] cannot [facility] the [facility]      Relevant Medications   cyclobenzaprine  (FLEXERIL ) 10 MG tablet   predniSONE (DELTASONE) 20 MG tablet   HYDROcodone -acetaminophen   (NORCO/VICODIN) 5-325 MG tablet    orders and follow up as documented in EMR I discussed the assessment and treatment plan with the patient. The patient was provided an opportunity to ask questions and all were answered. The patient agreed with the plan and demonstrated an understanding of the instructions.   The patient was advised to call back or seek an in-person evaluation if the symptoms worsen or if the condition fails to improve as anticipated.  Follow-Up: prn  I provided 28 minutes of non-face-to-face interaction with this non face-to-face encounter including intake, same-day documentation, and chart review.   Annella Kief, NP , DNP, AGNP-c [facility]

## 2024-01-01 NOTE — Assessment & Plan Note (Signed)
 Chronic low back pain with current exacerbation beginning Monday night, intensified with pain radiating to the hips, severe enough to prevent ambulation. Triggered by lifting his father and a near fall. Pain localized to lower back with muscle spasms. Cyclobenzaprine  (Flexeril ) discussed for muscle spasms, prednisone for inflammation with potential for transient hyperglycemia. Vicodin considered for acute pain, with insurance coverage issues; GoodRx suggested. Gabapentin considered if Vicodin unavailable due to prior authorization. - Prescribe cyclobenzaprine  (Flexeril ) for muscle spasms - Prescribe prednisone for inflammation - Prescribe Vicodin for acute pain management - Consider gabapentin if Vicodin is not available - [facility] - Advise him to contact the office if [facility] cannot [facility]

## 2024-01-01 NOTE — Telephone Encounter (Signed)
 Copied from CRM [id number]. Topic: Clinical - Medication Refill >> Jan 01, 2024  8:21 AM Rosaria Common wrote: Medication: Cyclobenzaprine  (not on list)  Has the patient contacted their pharmacy? Yes (Agent: If no, request that the patient contact the pharmacy for the refill. If patient does not wish to contact the pharmacy document the reason why and proceed with request.) (Agent: If yes, when and what did the pharmacy advise?)  This is the patient's preferred pharmacy:    [id number] [place], [place] - [address] [place] [address] Phone: [phone] Fax: [phone]  Is this the correct pharmacy for this prescription? Yes If no, delete pharmacy and type the correct one.   Has the prescription been filled recently? No  Is the patient out of the medication? Yes  Has the patient been seen for an appointment in the last year OR does the patient have an upcoming appointment? Yes  Can we respond through MyChart? Yes  Agent: Please be advised that Rx refills may take up to 3 business days. We ask that you follow-up with your pharmacy.

## 2024-01-02 ENCOUNTER — Emergency Department (HOSPITAL_COMMUNITY)

## 2024-01-02 ENCOUNTER — Other Ambulatory Visit: Payer: Self-pay

## 2024-01-02 ENCOUNTER — Encounter (HOSPITAL_COMMUNITY): Payer: Self-pay

## 2024-01-02 ENCOUNTER — Inpatient Hospital Stay (HOSPITAL_COMMUNITY)
Admission: EM | Admit: 2024-01-02 | Discharge: 2024-01-07 | DRG: 871 | Disposition: A | Discharge status: Home Health | Attending: Internal Medicine | Admitting: Internal Medicine

## 2024-01-02 ENCOUNTER — Other Ambulatory Visit: Payer: Self-pay | Admitting: Medical

## 2024-01-02 DIAGNOSIS — E119 Type 2 diabetes mellitus without complications: Secondary | ICD-10-CM

## 2024-01-02 DIAGNOSIS — M545 Low back pain, unspecified: Secondary | ICD-10-CM | POA: Diagnosis not present

## 2024-01-02 DIAGNOSIS — N390 Urinary tract infection, site not specified: Secondary | ICD-10-CM | POA: Diagnosis not present

## 2024-01-02 DIAGNOSIS — Z806 Family history of leukemia: Secondary | ICD-10-CM

## 2024-01-02 DIAGNOSIS — I214 Non-ST elevation (NSTEMI) myocardial infarction: Secondary | ICD-10-CM | POA: Diagnosis not present

## 2024-01-02 DIAGNOSIS — E1165 Type 2 diabetes mellitus with hyperglycemia: Secondary | ICD-10-CM | POA: Diagnosis not present

## 2024-01-02 DIAGNOSIS — I2489 Other forms of acute ischemic heart disease: Secondary | ICD-10-CM | POA: Diagnosis not present

## 2024-01-02 DIAGNOSIS — J9601 Acute respiratory failure with hypoxia: Secondary | ICD-10-CM | POA: Diagnosis present

## 2024-01-02 DIAGNOSIS — Z803 Family history of malignant neoplasm of breast: Secondary | ICD-10-CM

## 2024-01-02 DIAGNOSIS — J309 Allergic rhinitis, unspecified: Secondary | ICD-10-CM | POA: Diagnosis present

## 2024-01-02 DIAGNOSIS — E039 Hypothyroidism, unspecified: Secondary | ICD-10-CM | POA: Diagnosis not present

## 2024-01-02 DIAGNOSIS — Z6841 Body Mass Index (BMI) 40.0 and over, adult: Secondary | ICD-10-CM | POA: Diagnosis not present

## 2024-01-02 DIAGNOSIS — R0600 Dyspnea, unspecified: Secondary | ICD-10-CM | POA: Diagnosis not present

## 2024-01-02 DIAGNOSIS — J181 Lobar pneumonia, unspecified organism: Secondary | ICD-10-CM

## 2024-01-02 DIAGNOSIS — B9620 Unspecified Escherichia coli [E. coli] as the cause of diseases classified elsewhere: Secondary | ICD-10-CM | POA: Diagnosis present

## 2024-01-02 DIAGNOSIS — Z888 Allergy status to other drugs, medicaments and biological substances status: Secondary | ICD-10-CM

## 2024-01-02 DIAGNOSIS — E872 Acidosis, unspecified: Secondary | ICD-10-CM | POA: Diagnosis present

## 2024-01-02 DIAGNOSIS — R21 Rash and other nonspecific skin eruption: Secondary | ICD-10-CM | POA: Diagnosis present

## 2024-01-02 DIAGNOSIS — R6520 Severe sepsis without septic shock: Secondary | ICD-10-CM | POA: Diagnosis not present

## 2024-01-02 DIAGNOSIS — J90 Pleural effusion, not elsewhere classified: Secondary | ICD-10-CM | POA: Diagnosis not present

## 2024-01-02 DIAGNOSIS — Z79899 Other long term (current) drug therapy: Secondary | ICD-10-CM | POA: Diagnosis not present

## 2024-01-02 DIAGNOSIS — Z8249 Family history of ischemic heart disease and other diseases of the circulatory system: Secondary | ICD-10-CM

## 2024-01-02 DIAGNOSIS — R0602 Shortness of breath: Secondary | ICD-10-CM | POA: Diagnosis not present

## 2024-01-02 DIAGNOSIS — J189 Pneumonia, unspecified organism: Secondary | ICD-10-CM | POA: Diagnosis not present

## 2024-01-02 DIAGNOSIS — Z87891 Personal history of nicotine dependence: Secondary | ICD-10-CM

## 2024-01-02 DIAGNOSIS — E785 Hyperlipidemia, unspecified: Secondary | ICD-10-CM | POA: Diagnosis present

## 2024-01-02 DIAGNOSIS — Z833 Family history of diabetes mellitus: Secondary | ICD-10-CM | POA: Diagnosis not present

## 2024-01-02 DIAGNOSIS — R0902 Hypoxemia: Secondary | ICD-10-CM | POA: Diagnosis not present

## 2024-01-02 DIAGNOSIS — Z1152 Encounter for screening for COVID-19: Secondary | ICD-10-CM | POA: Diagnosis not present

## 2024-01-02 DIAGNOSIS — J984 Other disorders of lung: Secondary | ICD-10-CM | POA: Diagnosis not present

## 2024-01-02 DIAGNOSIS — M109 Gout, unspecified: Secondary | ICD-10-CM | POA: Diagnosis not present

## 2024-01-02 DIAGNOSIS — Z820 Family history of epilepsy and other diseases of the nervous system: Secondary | ICD-10-CM

## 2024-01-02 DIAGNOSIS — M47816 Spondylosis without myelopathy or radiculopathy, lumbar region: Secondary | ICD-10-CM | POA: Diagnosis not present

## 2024-01-02 DIAGNOSIS — I10 Essential (primary) hypertension: Secondary | ICD-10-CM | POA: Diagnosis present

## 2024-01-02 DIAGNOSIS — G4733 Obstructive sleep apnea (adult) (pediatric): Secondary | ICD-10-CM | POA: Diagnosis not present

## 2024-01-02 DIAGNOSIS — T380X5A Adverse effect of glucocorticoids and synthetic analogues, initial encounter: Secondary | ICD-10-CM | POA: Diagnosis not present

## 2024-01-02 DIAGNOSIS — R918 Other nonspecific abnormal finding of lung field: Secondary | ICD-10-CM | POA: Diagnosis not present

## 2024-01-02 DIAGNOSIS — N3001 Acute cystitis with hematuria: Secondary | ICD-10-CM

## 2024-01-02 DIAGNOSIS — J188 Other pneumonia, unspecified organism: Secondary | ICD-10-CM

## 2024-01-02 DIAGNOSIS — R0609 Other forms of dyspnea: Secondary | ICD-10-CM | POA: Diagnosis not present

## 2024-01-02 DIAGNOSIS — Z823 Family history of stroke: Secondary | ICD-10-CM

## 2024-01-02 DIAGNOSIS — Z7952 Long term (current) use of systemic steroids: Secondary | ICD-10-CM

## 2024-01-02 DIAGNOSIS — Z79890 Hormone replacement therapy (postmenopausal): Secondary | ICD-10-CM

## 2024-01-02 DIAGNOSIS — K219 Gastro-esophageal reflux disease without esophagitis: Secondary | ICD-10-CM | POA: Diagnosis not present

## 2024-01-02 DIAGNOSIS — N200 Calculus of kidney: Secondary | ICD-10-CM | POA: Diagnosis present

## 2024-01-02 DIAGNOSIS — M5135 Other intervertebral disc degeneration, thoracolumbar region: Secondary | ICD-10-CM | POA: Diagnosis not present

## 2024-01-02 DIAGNOSIS — Z87440 Personal history of urinary (tract) infections: Secondary | ICD-10-CM

## 2024-01-02 DIAGNOSIS — Z8261 Family history of arthritis: Secondary | ICD-10-CM

## 2024-01-02 DIAGNOSIS — M549 Dorsalgia, unspecified: Secondary | ICD-10-CM | POA: Diagnosis not present

## 2024-01-02 DIAGNOSIS — A419 Sepsis, unspecified organism: Secondary | ICD-10-CM | POA: Diagnosis not present

## 2024-01-02 DIAGNOSIS — I2510 Atherosclerotic heart disease of native coronary artery without angina pectoris: Secondary | ICD-10-CM | POA: Diagnosis not present

## 2024-01-02 LAB — URINALYSIS, W/ REFLEX TO CULTURE (INFECTION SUSPECTED)
Bilirubin Urine: NEGATIVE
Glucose, UA: NEGATIVE mg/dL
Ketones, ur: NEGATIVE mg/dL
Nitrite: NEGATIVE
Protein, ur: 100 mg/dL — AB
RBC / HPF: >50 RBC/hpf (ref 0–5)
Specific Gravity, Urine: 1.025 (ref 1.005–1.030)
WBC, UA: >50 WBC/hpf (ref 0–5)
pH: 5 (ref 5.0–8.0)

## 2024-01-02 LAB — CBC WITH DIFFERENTIAL/PLATELET
Abs Immature Granulocytes: 0.06 10*3/uL (ref 0.00–0.07)
Basophils Absolute: 0 10*3/uL (ref 0.0–0.1)
Basophils Relative: 0 %
Eosinophils Absolute: 0 10*3/uL (ref 0.0–0.5)
Eosinophils Relative: 0 %
HCT: 41.4 % (ref 39.0–52.0)
Hemoglobin: 14.2 g/dL (ref 13.0–17.0)
Immature Granulocytes: 1 %
Lymphocytes Relative: 5 %
Lymphs Abs: 0.6 10*3/uL — ABNORMAL LOW (ref 0.7–4.0)
MCH: 31.4 pg (ref 26.0–34.0)
MCHC: 34.3 g/dL (ref 30.0–36.0)
MCV: 91.6 fL (ref 80.0–100.0)
Monocytes Absolute: 0.9 10*3/uL (ref 0.1–1.0)
Monocytes Relative: 7 %
Neutro Abs: 11.3 10*3/uL — ABNORMAL HIGH (ref 1.7–7.7)
Neutrophils Relative %: 87 %
Platelets: 125 10*3/uL — ABNORMAL LOW (ref 150–400)
RBC: 4.52 MIL/uL (ref 4.22–5.81)
RDW: 13.9 % (ref 11.5–15.5)
WBC: 12.9 10*3/uL — ABNORMAL HIGH (ref 4.0–10.5)
nRBC: 0 % (ref 0.0–0.2)

## 2024-01-02 LAB — COMPREHENSIVE METABOLIC PANEL WITH GFR
ALT: 35 U/L (ref 0–44)
AST: 40 U/L (ref 15–41)
Albumin: 2.5 g/dL — ABNORMAL LOW (ref 3.5–5.0)
Alkaline Phosphatase: 68 U/L (ref 38–126)
Anion gap: 14 (ref 5–15)
BUN: 21 mg/dL — ABNORMAL HIGH (ref 6–20)
CO2: 19 mmol/L — ABNORMAL LOW (ref 22–32)
Calcium: 8.3 mg/dL — ABNORMAL LOW (ref 8.9–10.3)
Chloride: 104 mmol/L (ref 98–111)
Creatinine, Ser: 1.09 mg/dL (ref 0.61–1.24)
GFR, Estimated: >60 mL/min (ref >60)
Glucose, Bld: 198 mg/dL — ABNORMAL HIGH (ref 70–99)
Potassium: 4.1 mmol/L (ref 3.5–5.1)
Sodium: 137 mmol/L (ref 135–145)
Total Bilirubin: 0.9 mg/dL (ref 0.0–1.2)
Total Protein: 6.6 g/dL (ref 6.5–8.1)

## 2024-01-02 LAB — RESPIRATORY PANEL BY PCR

## 2024-01-02 LAB — I-STAT VENOUS BLOOD GAS, ED
Acid-base deficit: 2 mmol/L (ref 0.0–2.0)
Bicarbonate: 22.1 mmol/L (ref 20.0–28.0)
Calcium, Ion: 1.1 mmol/L — ABNORMAL LOW (ref 1.15–1.40)
HCT: 40 % (ref 39.0–52.0)
Hemoglobin: 13.6 g/dL (ref 13.0–17.0)
O2 Saturation: 92 %
Potassium: 4.1 mmol/L (ref 3.5–5.1)
Sodium: 137 mmol/L (ref 135–145)
TCO2: 23 mmol/L (ref 22–32)
pCO2, Ven: 35.4 mmHg — ABNORMAL LOW (ref 44–60)
pH, Ven: 7.403 (ref 7.25–7.43)
pO2, Ven: 62 mmHg — ABNORMAL HIGH (ref 32–45)

## 2024-01-02 LAB — I-STAT ARTERIAL BLOOD GAS, ED
Acid-base deficit: 2 mmol/L (ref 0.0–2.0)
Bicarbonate: 21.2 mmol/L (ref 20.0–28.0)
Calcium, Ion: 1.13 mmol/L — ABNORMAL LOW (ref 1.15–1.40)
HCT: 36 % — ABNORMAL LOW (ref 39.0–52.0)
Hemoglobin: 12.2 g/dL — ABNORMAL LOW (ref 13.0–17.0)
O2 Saturation: 95 %
Patient temperature: 99
Potassium: 3.7 mmol/L (ref 3.5–5.1)
Sodium: 135 mmol/L (ref 135–145)
TCO2: 22 mmol/L (ref 22–32)
pCO2 arterial: 31.7 mmHg — ABNORMAL LOW (ref 32–48)
pH, Arterial: 7.434 (ref 7.35–7.45)
pO2, Arterial: 71 mmHg — ABNORMAL LOW (ref 83–108)

## 2024-01-02 LAB — I-STAT CG4 LACTIC ACID, ED
Lactic Acid, Venous: 1.7 mmol/L (ref 0.5–1.9)
Lactic Acid, Venous: 3 mmol/L (ref 0.5–1.9)

## 2024-01-02 LAB — RESP PANEL BY RT-PCR (RSV, FLU A&B, COVID)  RVPGX2
Influenza A by PCR: NEGATIVE
Influenza B by PCR: NEGATIVE
Resp Syncytial Virus by PCR: NEGATIVE
SARS Coronavirus 2 by RT PCR: NEGATIVE

## 2024-01-02 LAB — MRSA NEXT GEN BY PCR, NASAL: MRSA by PCR Next Gen: DETECTED — AB

## 2024-01-02 LAB — PROTIME-INR
INR: 1.3 — ABNORMAL HIGH (ref 0.8–1.2)
Prothrombin Time: 16 s — ABNORMAL HIGH (ref 11.4–15.2)

## 2024-01-02 LAB — BRAIN NATRIURETIC PEPTIDE: B Natriuretic Peptide: 347.3 pg/mL — ABNORMAL HIGH (ref 0.0–100.0)

## 2024-01-02 LAB — GLUCOSE, CAPILLARY: Glucose-Capillary: 279 mg/dL — ABNORMAL HIGH (ref 70–99)

## 2024-01-02 LAB — SARS CORONAVIRUS 2 BY RT PCR: SARS Coronavirus 2 by RT PCR: NEGATIVE

## 2024-01-02 LAB — TROPONIN I (HIGH SENSITIVITY)
Troponin I (High Sensitivity): 137 ng/L (ref <18)
Troponin I (High Sensitivity): 100 ng/L (ref <18)

## 2024-01-02 LAB — HEMOGLOBIN A1C
Hgb A1c MFr Bld: 6.3 % — ABNORMAL HIGH (ref 4.8–5.6)
Mean Plasma Glucose: 134.11 mg/dL

## 2024-01-02 LAB — LACTIC ACID, PLASMA: Lactic Acid, Venous: 1.7 mmol/L (ref 0.5–1.9)

## 2024-01-02 LAB — HIV ANTIBODY (ROUTINE TESTING W REFLEX): HIV Screen 4th Generation wRfx: NONREACTIVE

## 2024-01-02 MED ORDER — ALLOPURINOL 300 MG PO TABS
300.0000 mg | ORAL_TABLET | Freq: Every day | ORAL | Status: DC
  Administered 2024-01-03 – 2024-01-07 (×5): 300 mg via ORAL
  Filled 2024-01-02 (×2): qty 1
  Filled 2024-01-02 (×2): qty 3
  Filled 2024-01-02: qty 1

## 2024-01-02 MED ORDER — LACTATED RINGERS IV BOLUS
1000.0000 mL | Freq: Once | INTRAVENOUS | Status: AC
  Administered 2024-01-02: 1000 mL via INTRAVENOUS

## 2024-01-02 MED ORDER — POLYETHYLENE GLYCOL 3350 17 G PO PACK: 17.0000 g | PACK | Freq: Every day | ORAL | Status: DC | PRN

## 2024-01-02 MED ORDER — METHYLPREDNISOLONE SODIUM SUCC 40 MG IJ SOLR
40.0000 mg | INTRAMUSCULAR | Status: DC
  Administered 2024-01-02 – 2024-01-04 (×3): 40 mg via INTRAVENOUS
  Filled 2024-01-02 (×3): qty 1

## 2024-01-02 MED ORDER — VANCOMYCIN HCL 1500 MG/300ML IV SOLN
1500.0000 mg | Freq: Two times a day (BID) | INTRAVENOUS | Status: DC
  Administered 2024-01-03 – 2024-01-04 (×3): 1500 mg via INTRAVENOUS
  Filled 2024-01-02 (×3): qty 300

## 2024-01-02 MED ORDER — INSULIN ASPART 100 UNIT/ML IJ SOLN
0.0000 [IU] | INTRAMUSCULAR | Status: DC
  Administered 2024-01-02: 5 [IU] via SUBCUTANEOUS
  Administered 2024-01-03 (×2): 8 [IU] via SUBCUTANEOUS
  Administered 2024-01-03: 5 [IU] via SUBCUTANEOUS
  Administered 2024-01-03 (×2): 3 [IU] via SUBCUTANEOUS
  Administered 2024-01-03: 11 [IU] via SUBCUTANEOUS
  Administered 2024-01-04: 3 [IU] via SUBCUTANEOUS
  Administered 2024-01-04 (×2): 5 [IU] via SUBCUTANEOUS

## 2024-01-02 MED ORDER — SODIUM CHLORIDE 0.9 % IV SOLN
2.0000 g | INTRAVENOUS | Status: DC
  Administered 2024-01-03 – 2024-01-06 (×4): 2 g via INTRAVENOUS
  Filled 2024-01-02 (×4): qty 20

## 2024-01-02 MED ORDER — SODIUM CHLORIDE 0.9 % IV BOLUS: 1000.0000 mL | Freq: Once | INTRAVENOUS | Status: DC

## 2024-01-02 MED ORDER — DOCUSATE SODIUM 100 MG PO CAPS: 100.0000 mg | ORAL_CAPSULE | Freq: Two times a day (BID) | ORAL | Status: DC | PRN

## 2024-01-02 MED ORDER — IPRATROPIUM-ALBUTEROL 0.5-2.5 (3) MG/3ML IN SOLN
3.0000 mL | Freq: Once | RESPIRATORY_TRACT | Status: AC
  Administered 2024-01-02: 3 mL via RESPIRATORY_TRACT
  Filled 2024-01-02: qty 3

## 2024-01-02 MED ORDER — NYSTATIN 100000 UNIT/GM EX POWD
Freq: Two times a day (BID) | CUTANEOUS | Status: DC
  Administered 2024-01-06: 1 via TOPICAL
  Filled 2024-01-02: qty 15

## 2024-01-02 MED ORDER — SODIUM CHLORIDE 0.9 % IV SOLN
500.0000 mg | INTRAVENOUS | Status: DC
  Administered 2024-01-03 – 2024-01-05 (×3): 500 mg via INTRAVENOUS
  Filled 2024-01-02 (×3): qty 5

## 2024-01-02 MED ORDER — MONTELUKAST SODIUM 10 MG PO TABS
10.0000 mg | ORAL_TABLET | Freq: Every day | ORAL | Status: DC
  Administered 2024-01-02 – 2024-01-06 (×5): 10 mg via ORAL
  Filled 2024-01-02 (×5): qty 1

## 2024-01-02 MED ORDER — HEPARIN SODIUM (PORCINE) 5000 UNIT/ML IJ SOLN
5000.0000 [IU] | Freq: Three times a day (TID) | INTRAMUSCULAR | Status: DC
  Administered 2024-01-02 – 2024-01-07 (×14): 5000 [IU] via SUBCUTANEOUS
  Filled 2024-01-02 (×14): qty 1

## 2024-01-02 MED ORDER — VANCOMYCIN HCL 10 G IV SOLR
2500.0000 mg | Freq: Once | INTRAVENOUS | Status: AC
  Administered 2024-01-02: 2500 mg via INTRAVENOUS
  Filled 2024-01-02: qty 20

## 2024-01-02 MED ORDER — LEVOTHYROXINE SODIUM 25 MCG PO TABS
25.0000 ug | ORAL_TABLET | Freq: Every day | ORAL | Status: DC
  Administered 2024-01-03 – 2024-01-07 (×5): 25 ug via ORAL
  Filled 2024-01-02 (×5): qty 1

## 2024-01-02 MED ORDER — ACETAMINOPHEN 325 MG PO TABS
650.0000 mg | ORAL_TABLET | Freq: Once | ORAL | Status: AC | PRN
  Administered 2024-01-02: 650 mg via ORAL
  Filled 2024-01-02: qty 2

## 2024-01-02 MED ORDER — IOHEXOL 350 MG/ML SOLN
100.0000 mL | Freq: Once | INTRAVENOUS | Status: AC | PRN
  Administered 2024-01-02: 100 mL via INTRAVENOUS

## 2024-01-02 MED ORDER — SODIUM CHLORIDE 0.9 % IV SOLN
500.0000 mg | Freq: Once | INTRAVENOUS | Status: AC
  Administered 2024-01-02: 500 mg via INTRAVENOUS
  Filled 2024-01-02: qty 5

## 2024-01-02 MED ORDER — LORATADINE 10 MG PO TABS
10.0000 mg | ORAL_TABLET | Freq: Every day | ORAL | Status: DC
  Administered 2024-01-02 – 2024-01-07 (×6): 10 mg via ORAL
  Filled 2024-01-02 (×6): qty 1

## 2024-01-02 MED ORDER — METHYLPREDNISOLONE SODIUM SUCC 125 MG IJ SOLR
125.0000 mg | Freq: Once | INTRAMUSCULAR | Status: AC
  Administered 2024-01-02: 125 mg via INTRAVENOUS
  Filled 2024-01-02: qty 2

## 2024-01-02 MED ORDER — FLUOXETINE HCL 20 MG PO CAPS
40.0000 mg | ORAL_CAPSULE | Freq: Every day | ORAL | Status: DC
  Administered 2024-01-02 – 2024-01-07 (×6): 40 mg via ORAL
  Filled 2024-01-02 (×6): qty 2

## 2024-01-02 MED ORDER — SODIUM CHLORIDE 0.9 % IV SOLN
2.0000 g | Freq: Once | INTRAVENOUS | Status: AC
  Administered 2024-01-02 (×2): 2 g via INTRAVENOUS
  Filled 2024-01-02: qty 20

## 2024-01-02 NOTE — ED Notes (Signed)
 Report given to RN on 14M.

## 2024-01-02 NOTE — Progress Notes (Signed)
 Pharmacy Antibiotic Note  Patrick Baxter is a 53 y.o. male for which pharmacy has been consulted for vancomycin  dosing for pneumonia.  Patient with a history of obesity, DM, HTN, OSA. Patient presenting with back pain. Found to be in respiratory failure w/ hypoxia. Being treated for likely multi-lobar pna  SCr 1.09 WBC 12.9; LA 1.7>3; T 99.2; HR 91; RR 26 COVID neg / flu neg  Plan: Rocephin /azith per MD Vancomycin  2500 mg once then 1500 mg q12hr (eAUC 465.5) unless change in renal function Monitor WBC, fever, renal function, cultures De-escalate when able Levels at steady state F/u MRSA PCR  Height: 5' 8.5 (174 cm) Weight: (!) 190.5 kg (420 lb) IBW/kg (Calculated) : 69.55  Temp (24hrs), Avg:100.3 F (37.9 C), Min:99.2 F (37.3 C), Max:101.3 F (38.5 C)  Recent Labs  Lab 01/02/24 1355 01/02/24 1401 01/02/24 1706  WBC 12.9*  --   --   CREATININE 1.09  --   --   LATICACIDVEN  --  1.7 3.0*    Estimated Creatinine Clearance: 129.3 mL/min (by C-G formula based on SCr of 1.09 mg/dL).    Allergies  Allergen Reactions   Atorvastatin  Other (See Comments)    myalgia   Pravastatin  Sodium Other (See Comments)    myalgia   Microbiology results: Pending  Thank you for allowing pharmacy to be a part of this patient's care.  Dorn Buttner, PharmD, BCPS 01/02/2024 5:50 PM ED Clinical Pharmacist -  [phone]

## 2024-01-02 NOTE — H&P (Signed)
 NAME:  Patrick Baxter, MRN:  [id number], DOB:  Apr 29, 1971, LOS: 0 ADMISSION DATE:  01/02/2024, CONSULTATION DATE:  6/21 REFERRING MD:  Lang- EDPA, CHIEF COMPLAINT:  respiratory failure   History of Present Illness:  Patrick Baxter is a 53 y/o gentleman with a history of obesity, DM, HTN, OSA on CPAP who presented for back pain that was getting worse for the past few days. He noticed mild SOB and cough that began last night, and his cough made his back pain more severe. Yesterday he had an UC tele visit for back pain, radiating into hips, and he was prescribed prednisone , flexeril , and hydrocodone .  When EMS arrived he was saturating 68% when EMS arrived. He was placed on NRB and achieved saturations of 90%. In the ED he had blood cultures drawn (second set after antibiotics) , was started on ceftriaxone  and azithromycin , and was placed on HHFNC. PCCM was called for evaluation and management.   He does not smoke or vape. No new environmental exposures that he is aware of. He is not on immune suppressing meds; no personal history of autoimmune disease. He has not family history of lung disease. He works at [employer] and is around children frequently, but no specific sick contacts that he is aware of. No tick bites. No recent URI or GI symptoms.   Pertinent  Medical History  OSA DM HTN HLD obesity  Significant Hospital Events: Including procedures, antibiotic start and stop dates in addition to other pertinent events     Interim History / Subjective:    Objective    Blood pressure 113/60, pulse 88, temperature 99.2 F (37.3 C), temperature source Oral, resp. rate (!) 25, height 5' 8.5 (1.74 m), weight (!) 190.5 kg, SpO2 96%.    FiO2 (%):  [60 %] 60 %  No intake or output data in the 24 hours ending 01/02/24 1730 Filed Weights   01/02/24 1350  Weight: (!) 190.5 kg    Examination: General: middle aged man lying in bed in NAD HENT: [place]/AT, eyes anicteric Lungs:  tachypnea without accessory muscle use, distant lung sounds. Speaking in full sentences.  Cardiovascular: S1S2, RRR Abdomen: obese, soft, NT Extremities: mild LE edema, no cyanosis Neuro: awake, alert, moving all extremities  LA 1.7> 3 7.4/35/62/22 Trop 137 BUN 21 Cr 1.09 WBC 12.9  Resolved problem list   Assessment and Plan  Acute respiratory failure with hypoxia- diffuse bilateral airspace disease most likely multilobar pneumonia. DAH, autoimmune reaction, eosinophilic pneumonia to unknown exposure all possible  -check blood cultures -RVP, MRSA nares, covid -sputum culture, legionella, strep pneumoniae antigen -azithromycin , ceftriaxone , vanc, steroids -supplemental O2 as required to maintain SpO2 >88%; currently on HHF at 35L, 65% with appropriate WOB and saturations. He verbally consented for intubation if it was required. Needs ICU due to high risk of decompensation.  UTI- present on admission -urine culture -ceftriaxone   Severe OSA, AHI 103.9 -nocturnal CPAP at 13 cm H2O with bleed in oxygen  DM;  anticipate hyperglycemia from steroids -SSI PRN -goal BG 140-180 -check a1c -hold PTA semaglutide   Back pain-- not sure if this is MSK pain related to myalgias or something else.  -oxycodone PRN  GERD -allopurinol   Allergic rhinitis -con't PTA montelukast   Rash under pannus- present on admission -nystatin  powder  H/o HTN -hold PTA metoprolol  and lisinopril   H/o gout -allopurinol    Father is surrogate decision maker-- he will update him tonight that he is being admitted to ICU. His father his is NOK  and lives in assisted living at [facility]   Best Practice (right click and Reselect all SmartList Selections daily)   Diet/type: clear liquids DVT prophylaxis prophylactic heparin   Pressure ulcer(s): defer to RN assessment GI prophylaxis: N/A Lines: N/A Foley:  N/A Code Status:  full code Last date of multidisciplinary goals of care discussion [with  patient at admission]  Labs   CBC: Recent Labs  Lab 01/02/24 1355 01/02/24 1545 01/02/24 1705  WBC 12.9*  --   --   NEUTROABS 11.3*  --   --   HGB 14.2 12.2* 13.6  HCT 41.4 36.0* 40.0  MCV 91.6  --   --   PLT 125*  --   --     Basic Metabolic Panel: Recent Labs  Lab 01/02/24 1355 01/02/24 1545 01/02/24 1705  NA 137 135 137  K 4.1 3.7 4.1  CL 104  --   --   CO2 19*  --   --   GLUCOSE 198*  --   --   BUN 21*  --   --   CREATININE 1.09  --   --   CALCIUM  8.3*  --   --    GFR: Estimated Creatinine Clearance: 129.3 mL/min (by C-G formula based on SCr of 1.09 mg/dL). Recent Labs  Lab 01/02/24 1355 01/02/24 1401 01/02/24 1706  WBC 12.9*  --   --   LATICACIDVEN  --  1.7 3.0*    Liver Function Tests: Recent Labs  Lab 01/02/24 1355  AST 40  ALT 35  ALKPHOS 68  BILITOT 0.9  PROT 6.6  ALBUMIN 2.5*   No results for input(s): LIPASE, AMYLASE in the last 168 hours. No results for input(s): AMMONIA in the last 168 hours.  ABG    Component Value Date/Time   PHART 7.434 01/02/2024 1545   PCO2ART 31.7 (L) 01/02/2024 1545   PO2ART 71 (L) 01/02/2024 1545   HCO3 22.1 01/02/2024 1705   TCO2 23 01/02/2024 1705   ACIDBASEDEF 2.0 01/02/2024 1705   O2SAT 92 01/02/2024 1705     Coagulation Profile: No results for input(s): INR, PROTIME in the last 168 hours.  Cardiac Enzymes: No results for input(s): CKTOTAL, CKMB, CKMBINDEX, TROPONINI in the last 168 hours.  HbA1C: Hemoglobin A1C  Date/Time Value Ref Range Status  04/01/2022 10:58 AM 6.5 (A) 4.0 - 5.6 % Final   Hgb A1c MFr Bld  Date/Time Value Ref Range Status  12/01/2022 10:54 AM 6.4 (H) 4.8 - 5.6 % Final    Comment:             Prediabetes: 5.7 - 6.4          Diabetes: >6.4          Glycemic control for adults with diabetes: <7.0   11/26/2021 09:37 AM 6.8 (H) 4.8 - 5.6 % Final    Comment:             Prediabetes: 5.7 - 6.4          Diabetes: >6.4          Glycemic control for adults  with diabetes: <7.0     CBG: No results for input(s): GLUCAP in the last 168 hours.  Review of Systems:   Review of Systems  Constitutional:  Negative for chills and fever.  HENT:  Positive for congestion.   Respiratory:  Positive for cough and shortness of breath.   Cardiovascular: Negative.   Gastrointestinal:  Negative for constipation, diarrhea, nausea and vomiting.  Musculoskeletal:  Positive for back pain.  Skin:  Positive for rash.     Past Medical History:  He,  has a past medical history of Allergy, Anxiety, Back pain, Bilateral chronic knee pain, Chronic headaches, Depression, Dermatographism, Dry skin, Fatigue, Frequent urination, Gout, echocardiogram (09/07/2019), Hyperlipidemia, Hypertension, Joint pain, Knee pain, Obesity, Pre-diabetes, Sleep apnea, Stress, and Wears glasses.   Surgical History:   Past Surgical History:  Procedure Laterality Date   IR INJECT/THERA/INC NEEDLE/CATH/PLC EPI/LUMB/SAC [facility]  09/14/2020   IR INJECT/THERA/INC NEEDLE/CATH/PLC EPI/LUMB/SAC W/IMG  09/14/2020   KNEE SURGERY  11/2006   right knee arthroscopy and meniscal repair, Dr. Rendell     Social History:   reports that he has quit smoking. His smoking use included cigarettes. He has never used smokeless tobacco. He reports current alcohol use of about 3.0 standard drinks of alcohol per week. He reports that he does not use drugs.   Family History:  His family history includes Alzheimer's disease in his mother; Arthritis in his father; Cancer in his maternal grandmother and mother; Diabetes in his paternal grandmother; Heart disease in his father and mother; Hyperlipidemia in his mother; Hypertension in his mother; Stroke in his mother.   Allergies Allergies  Allergen Reactions   Atorvastatin  Other (See Comments)    myalgia   Pravastatin  Sodium Other (See Comments)    myalgia     Home Medications  Prior to Admission medications   Medication Sig Start Date End Date Taking? Authorizing  Provider  albuterol  (VENTOLIN  HFA) 108 (90 Base) MCG/ACT inhaler Inhale 2 puffs into the lungs every 6 (six) hours as needed for wheezing or shortness of breath. 12/01/22   Tysinger, Alm RAMAN, PA-C  allopurinol  (ZYLOPRIM ) 300 MG tablet TAKE 1 TABLET(300 MG) BY MOUTH DAILY 12/08/23   Tysinger, Alm RAMAN, PA-C  B Complex-C (PROBEC-T PO) Take by mouth.    [provider]  Calcium -Magnesium-Zinc [phone] MG TABS Take 1 tablet by mouth daily.    [provider]  cetirizine (ZYRTEC) 10 MG tablet Take 10 mg by mouth daily.    [provider]  cholecalciferol (VITAMIN D3) 25 MCG (1000 UNIT) tablet Take 1 tablet (1,000 Units total) by mouth daily. 12/01/22   Tysinger, Alm RAMAN, PA-C  clotrimazole -betamethasone  (LOTRISONE ) cream Apply 1 Application topically daily. 12/11/23   Tysinger, Alm RAMAN, PA-C  Coenzyme Q10 (CO Q 10 PO) Take by mouth.    [provider]  colchicine  0.6 MG tablet TAKE 1 TABLET(0.6 MG) BY MOUTH TWICE DAILY FOR FLARE UP 12/28/23   Tysinger, Alm RAMAN, PA-C  Continuous Glucose Sensor (FREESTYLE LIBRE 3 SENSOR) MISC 2 each by Does not apply route every 14 (fourteen) days. Patient not taking: Reported on 01/01/2024 12/01/22   Tysinger, Alm RAMAN, PA-C  cyclobenzaprine  (FLEXERIL ) 10 MG tablet Take 1 tablet (10 mg total) by mouth 3 (three) times daily as needed for muscle spasms. 01/01/24   Early, Sara E, NP  FLUoxetine  (PROZAC ) 40 MG capsule TAKE 1 CAPSULE(40 MG) BY MOUTH DAILY 12/08/23   Tysinger, Alm RAMAN, PA-C  guaiFENesin (MUCINEX PO) Take 1 tablet by mouth every 4 (four) hours.    [provider]  HYDROcodone -acetaminophen  (NORCO/VICODIN) 5-325 MG tablet Take 1-2 tablets by mouth every 6 (six) hours as needed for up to 5 days for moderate pain (pain score 4-6) or severe pain (pain score 7-10). 01/01/24 01/06/24  Early, Camie BRAVO, NP  ketoconazole  (NIZORAL ) 200 MG tablet Take 1 tablet (200 mg total) by mouth daily. X 5 days,  can repeat in a week or 2 if not much  improved Patient not taking: Reported on 01/01/2024 12/11/23   Tysinger, Alm RAMAN, PA-C  levothyroxine  (SYNTHROID ) 25 MCG tablet TAKE 1 TABLET BY MOUTH EVERY DAY BEFORE BREAKFAST 12/14/23   Tysinger, Alm RAMAN, PA-C  lisinopril  (ZESTRIL ) 20 MG tablet TAKE 1 TABLET(20 MG) BY MOUTH DAILY 06/22/23   Tysinger, Alm RAMAN, PA-C  meclizine  (ANTIVERT ) 25 MG tablet Take 1 tablet (25 mg total) by mouth 2 (two) times daily. 02/04/23   Tysinger, Alm RAMAN, PA-C  metoprolol  succinate (TOPROL -XL) 50 MG 24 hr tablet TAKE 1 TABLET BY MOUTH IMMEDIATLEY WITH OR IMMEDIATELY FOLLOWING A MEAL 12/30/23   Tysinger, Alm RAMAN, PA-C  montelukast  (SINGULAIR ) 10 MG tablet TAKE 1 TABLET(10 MG) BY MOUTH AT BEDTIME 12/01/22   Tysinger, Alm RAMAN, PA-C  Multiple Vitamins-Minerals (MULTIVITAMIN WITH MINERALS) tablet Take 1 tablet by mouth daily. 12/01/22   Tysinger, Alm RAMAN, PA-C  naproxen  (NAPROSYN ) 250 MG tablet Take by mouth 3 (three) times daily with meals.    [provider]  nitroGLYCERIN  (NITROSTAT ) 0.4 MG SL tablet Place 1 tablet (0.4 mg total) under the tongue every 5 (five) minutes as needed for chest pain. 08/30/19 01/01/24  Tolia, Sunit, DO  nystatin  (MYCOSTATIN /NYSTOP ) powder Apply 1 Application topically 2 (two) times daily. 12/11/23   Tysinger, Alm RAMAN, PA-C  Omega-3 Fatty Acids (SALMON OIL-1000) 200 MG CAPS Take by mouth.    [provider]  phenylephrine (SUDAFED PE) 10 MG TABS tablet Take 10 mg by mouth every 4 (four) hours as needed.    [provider]  predniSONE  (DELTASONE ) 20 MG tablet Take 1 tablet (20 mg total) by mouth daily with breakfast. 01/01/24   Early, Camie BRAVO, NP  Semaglutide  (RYBELSUS ) 3 MG TABS Take 3 mg by mouth daily.    [provider]  sodium chloride  (OCEAN) 0.65 % SOLN nasal spray Place 1 spray into both nostrils as needed for congestion.    [provider]     Critical care time:        This patient is critically ill with multiple organ system failure which  requires frequent high complexity decision making, assessment, support, evaluation, and titration of therapies. This was completed through the application of advanced monitoring technologies and extensive interpretation of multiple databases. During this encounter critical care time was devoted to patient care services described in this note for 41 minutes.  Leita SHAUNNA Gaskins, DO 01/02/24 5:30 PM [facility]  For contact information, see Amion. If no response to pager, please call PCCM consult pager. After hours, 7PM- 7AM, please call Elink.

## 2024-01-02 NOTE — ED Provider Notes (Signed)
 [place] EMERGENCY DEPARTMENT AT [facility] Provider Note   CSN: [id number] Arrival date & time: 01/02/24  1339     Patient presents with: Shortness of Breath (/) and Back Pain  HPI Patrick Baxter is a 53 y.o. male with hypertension, type 2 diabetes with shortness of breath and back pain.  States that shortness of breath started gradually yesterday evening also reports a productive cough as well and subjective fever at home.  Denies associated chest pain.  He also reports that back pain started this past Sunday.  He was moving some furniture for his dad and noted lower back pain that radiates to the lower mid back.  Denies urinary symptoms.  Denies any pain radiating into the abdomen.  Denies nausea vomiting and diarrhea.  Shortness of breath is worse with exertion.    Shortness of Breath Back Pain      Prior to Admission medications   Medication Sig Start Date End Date Taking? Authorizing Provider  albuterol  (VENTOLIN  HFA) 108 (90 Base) MCG/ACT inhaler Inhale 2 puffs into the lungs every 6 (six) hours as needed for wheezing or shortness of breath. 12/01/22   Tysinger, Alm RAMAN, PA-C  allopurinol  (ZYLOPRIM ) 300 MG tablet TAKE 1 TABLET(300 MG) BY MOUTH DAILY 12/08/23   Tysinger, Alm RAMAN, PA-C  B Complex-C (PROBEC-T PO) Take by mouth.    [provider]  Calcium -Magnesium-Zinc [id number] MG TABS Take 1 tablet by mouth daily.    [provider]  cetirizine (ZYRTEC) 10 MG tablet Take 10 mg by mouth daily.    [provider]  cholecalciferol (VITAMIN D3) 25 MCG (1000 UNIT) tablet Take 1 tablet (1,000 Units total) by mouth daily. 12/01/22   Tysinger, Alm RAMAN, PA-C  clotrimazole -betamethasone  (LOTRISONE ) cream Apply 1 Application topically daily. 12/11/23   Tysinger, Alm RAMAN, PA-C  Coenzyme Q10 (CO Q 10 PO) Take by mouth.    [provider]  colchicine  0.6 MG tablet TAKE 1 TABLET(0.6 MG) BY MOUTH TWICE DAILY FOR FLARE UP 12/28/23   Tysinger, Alm RAMAN, PA-C   Continuous Glucose Sensor (FREESTYLE LIBRE 3 SENSOR) MISC 2 each by Does not apply route every 14 (fourteen) days. Patient not taking: Reported on 01/01/2024 12/01/22   Tysinger, Alm RAMAN, PA-C  cyclobenzaprine  (FLEXERIL ) 10 MG tablet Take 1 tablet (10 mg total) by mouth 3 (three) times daily as needed for muscle spasms. 01/01/24   Early, Sara E, NP  FLUoxetine  (PROZAC ) 40 MG capsule TAKE 1 CAPSULE(40 MG) BY MOUTH DAILY 12/08/23   Tysinger, Alm RAMAN, PA-C  guaiFENesin (MUCINEX PO) Take 1 tablet by mouth every 4 (four) hours.    [provider]  HYDROcodone -acetaminophen  (NORCO/VICODIN) 5-325 MG tablet Take 1-2 tablets by mouth every 6 (six) hours as needed for up to 5 days for moderate pain (pain score 4-6) or severe pain (pain score 7-10). 01/01/24 01/06/24  Early, Camie BRAVO, NP  ketoconazole  (NIZORAL ) 200 MG tablet Take 1 tablet (200 mg total) by mouth daily. X 5 days, can repeat in a week or 2 if not much improved Patient not taking: Reported on 01/01/2024 12/11/23   Tysinger, Alm RAMAN, PA-C  levothyroxine  (SYNTHROID ) 25 MCG tablet TAKE 1 TABLET BY MOUTH EVERY DAY BEFORE BREAKFAST 12/14/23   Tysinger, Alm RAMAN, PA-C  lisinopril  (ZESTRIL ) 20 MG tablet TAKE 1 TABLET(20 MG) BY MOUTH DAILY 06/22/23   Tysinger, Alm RAMAN, PA-C  meclizine  (ANTIVERT ) 25 MG tablet Take 1 tablet (25 mg total) by mouth 2 (two) times daily. 02/04/23  Tysinger, Alm RAMAN, PA-C  metoprolol  succinate (TOPROL -XL) 50 MG 24 hr tablet TAKE 1 TABLET BY MOUTH IMMEDIATLEY WITH OR IMMEDIATELY FOLLOWING A MEAL 12/30/23   Tysinger, Alm RAMAN, PA-C  montelukast  (SINGULAIR ) 10 MG tablet TAKE 1 TABLET(10 MG) BY MOUTH AT BEDTIME 12/01/22   Tysinger, Alm RAMAN, PA-C  Multiple Vitamins-Minerals (MULTIVITAMIN WITH MINERALS) tablet Take 1 tablet by mouth daily. 12/01/22   Tysinger, Alm RAMAN, PA-C  naproxen  (NAPROSYN ) 250 MG tablet Take by mouth 3 (three) times daily with meals.    [provider]  nitroGLYCERIN  (NITROSTAT ) 0.4 MG SL tablet Place 1 tablet  (0.4 mg total) under the tongue every 5 (five) minutes as needed for chest pain. 08/30/19 01/01/24  Tolia, Sunit, DO  nystatin  (MYCOSTATIN /NYSTOP ) powder Apply 1 Application topically 2 (two) times daily. 12/11/23   Tysinger, Alm RAMAN, PA-C  Omega-3 Fatty Acids (SALMON OIL-1000) 200 MG CAPS Take by mouth.    [provider]  phenylephrine (SUDAFED PE) 10 MG TABS tablet Take 10 mg by mouth every 4 (four) hours as needed.    [provider]  predniSONE  (DELTASONE ) 20 MG tablet Take 1 tablet (20 mg total) by mouth daily with breakfast. 01/01/24   Early, Camie BRAVO, NP  Semaglutide  (RYBELSUS ) 3 MG TABS Take 3 mg by mouth daily.    [provider]  sodium chloride  (OCEAN) 0.65 % SOLN nasal spray Place 1 spray into both nostrils as needed for congestion.    [provider]    Allergies: Atorvastatin  and Pravastatin  sodium    Review of Systems  Respiratory:  Positive for shortness of breath.   Musculoskeletal:  Positive for back pain.    Updated Vital Signs BP (!) 92/46   Pulse 91   Temp 99.2 F (37.3 C) (Oral)   Resp (!) 26   Ht 5' 8.5 (1.74 m)   Wt (!) 190.5 kg   SpO2 93%   BMI 62.93 kg/m    Physical Exam   Vitals:   01/02/24 1500 01/02/24 1505  BP: (!) 92/46   Pulse: 91   Resp: (!) 28 (!) 26  Temp:    SpO2: 92% 93%    CONSTITUTIONAL:  well-appearing, NAD NEURO:  Alert and oriented x 3, CN 3-12 grossly intact EYES:  eyes equal and reactive ENT/NECK:  Supple, no stridor  CARDIO:  regular rate and rhythm, appears well-perfused  PULM: Tachypneic, sats in the low 90s on nonrebreather, lung sounds are diffusely diminished, no wheezing or crackles GI/GU:  non-distended, soft non tender MSK/SPINE:  No gross deformities, no edema, moves all extremities  SKIN:  no rash, atraumatic   *Additional and/or pertinent findings included in MDM below    (all labs ordered are listed, but only abnormal results are displayed) Labs Reviewed  COMPREHENSIVE  METABOLIC PANEL WITH GFR - Abnormal; Notable for the following components:      Result Value   CO2 19 (*)    Glucose, Bld 198 (*)    BUN 21 (*)    Calcium  8.3 (*)    Albumin 2.5 (*)    All other components within normal limits  CBC WITH DIFFERENTIAL/PLATELET - Abnormal; Notable for the following components:   WBC 12.9 (*)    Platelets 125 (*)    Neutro Abs 11.3 (*)    Lymphs Abs 0.6 (*)    All other components within normal limits  URINALYSIS, W/ REFLEX TO CULTURE (INFECTION SUSPECTED) - Abnormal; Notable for the following components:   Color, Urine AMBER (*)  APPearance CLOUDY (*)    Hgb urine dipstick LARGE (*)    Protein, ur 100 (*)    Leukocytes,Ua LARGE (*)    Bacteria, UA MANY (*)    Non Squamous Epithelial 0-5 (*)    All other components within normal limits  RESP PANEL BY RT-PCR (RSV, FLU A&B, COVID)  RVPGX2  CULTURE, BLOOD (ROUTINE X 2)  CULTURE, BLOOD (ROUTINE X 2)  URINE CULTURE  BRAIN NATRIURETIC PEPTIDE  PROTIME-INR  I-STAT CG4 LACTIC ACID, ED  I-STAT ARTERIAL BLOOD GAS, ED  I-STAT CG4 LACTIC ACID, ED  TROPONIN I (HIGH SENSITIVITY)  TROPONIN I (HIGH SENSITIVITY)    EKG: None  Radiology: DG Chest 1 View Result Date: 01/02/2024 CLINICAL DATA:  Shortness of breath. EXAM: CHEST  1 VIEW COMPARISON:  09/28/2014 FINDINGS: Dense airspace consolidation in the right mid and upper lung zone. More diffusely nodular appearing airspace opacity in the left mid and lower lung zones. True lung nodules can not be excluded. No pleural fluid. Interval mildly enlarged cardiac silhouette. Moderate thoracic spine degenerative changes. IMPRESSION: 1. Dense airspace consolidation in the right mid and upper lung zone and more diffusely nodular appearing airspace opacity in the left mid and lower lung zones. Findings are most consistent with multifocal pneumonia. Followup PA and lateral chest X-ray is recommended in 3-4 weeks following trial of antibiotic therapy to ensure resolution and  exclude underlying malignancy. 2. Interval mild cardiomegaly. Electronically Signed   By: Elspeth Bathe M.D.   On: 01/02/2024 14:54     Procedures   Medications Ordered in the ED  azithromycin  (ZITHROMAX ) 500 mg in sodium chloride  0.9 % 250 mL IVPB (500 mg Intravenous New Bag/Given 01/02/24 1500)  lactated ringers bolus 1,000 mL (has no administration in time range)  acetaminophen  (TYLENOL ) tablet 650 mg (650 mg Oral Given 01/02/24 1449)  methylPREDNISolone  sodium succinate (SOLU-MEDROL ) 125 mg/2 mL injection 125 mg (125 mg Intravenous Given 01/02/24 1440)  ipratropium-albuterol  (DUONEB) 0.5-2.5 (3) MG/3ML nebulizer solution 3 mL (3 mLs Nebulization Given 01/02/24 1440)  cefTRIAXone  (ROCEPHIN ) 2 g in sodium chloride  0.9 % 100 mL IVPB (2 g Intravenous Other (enter comment in med admin window) 01/02/24 1452)    Clinical Course as of 01/02/24 1547  Sat Jan 02, 2024  1544 Pt taken at shift chan [AH]    Clinical Course User Index [AH] Arloa Chroman, PA-C                                 Medical Decision Making Amount and/or Complexity of Data Reviewed Labs: ordered. Radiology: ordered.  Risk OTC drugs. Prescription drug management.   Initial Impression and Ddx 53 year old well-appearing male presenting for shortness of breath cough and lower back pain.  Exam notable for diminished breath sounds, tachypnea and hypoxia on nonrebreather, and febrile.  Appears to be in obvious respiratory distress.  Activated code sepsis and started him on ceftriaxone  and azithromycin  for presumed pneumonia as etiology.  DDx includes pneumonia, sepsis, CHF exacerbation, PE, ACS, COPD exacerbation, intra-abdominal infection, pyelonephritis, UTI, other. Patient PMH that increases complexity of ED encounter: hypertension, type 2 diabetes  Interpretation of Diagnostics - I independent reviewed and interpreted the labs as followed: Leukocytosis (12.9)  - I independently visualized the following imaging with  scope of interpretation limited to determining acute life threatening conditions related to emergency care: CXR, which revealed findings concerning for multifocal pneumonia, CT angio of the chest for PE, abdomen pelvis,  and CT L-spine are pending.  -I personally reviewed and interpreted EKG which revealed sinus rhythm with evidence of RBBB  Patient Reassessment and Ultimate Disposition/Management Transition to high flow nasal cannula on 10 L.  On reassessment he was tachypneic, diaphoretic and satting in the high 80s.  Increased to 15 L and reach out to respiratory with plans to escalate to heated high flow nasal cannula also reposition.  Sats did improve marginally with repositioning.  Also now hypotensive.  Ordered 1 L of lactated Ringer's.  At this time she suspicion is septic shock secondary to pneumonia.  Urinalysis also concerning for possible UTI in the setting of back pain is also concerning for pyelonephritis.  Also arterial blood gas is pending as well.  Plan at this time is to escalate his respiratory therapy to heated high flow nasal cannula.  However if the arterial blood gas is concerning may need to consider intubation.  Will likely be an ICU admission.  Signed out to PA [employer].  Patient management required discussion with the following services or consulting groups:  None  Complexity of Problems Addressed Acute complicated illness or Injury  Additional Data Reviewed and Analyzed Further history obtained from: Past medical history and medications listed in the EMR and Prior ED visit notes  Patient Encounter Risk Assessment Consideration of hospitalization      Final diagnoses:  Shortness of breath    ED Discharge Orders     None          Lang Norleen POUR, PA-C 01/02/24 1547    Albertina Dixon, MD 01/02/24 2124

## 2024-01-02 NOTE — ED Provider Notes (Signed)
  Physical Exam  BP 129/63   Pulse 84   Temp 99.2 F (37.3 C) (Oral)   Resp (!) 22   Ht 5' 8.5 (1.74 m)   Wt (!) 190.5 kg   SpO2 98%   BMI 62.93 kg/m   Physical Exam Constitutional:      Appearance: He is obese. He is ill-appearing.  Pulmonary:     Effort: Tachypnea present.     Breath sounds: Wheezing present. No decreased breath sounds.     Procedures  .Critical Care  Performed by: Arloa Chroman, PA-C Authorized by: Arloa Chroman, PA-C   Critical care provider statement:    Critical care time (minutes):  50   Critical care was necessary to treat or prevent imminent or life-threatening deterioration of the following conditions:  Sepsis, shock and respiratory failure   Critical care was time spent personally by me on the following activities:  Development of treatment plan with patient or surrogate, discussions with consultants, evaluation of patient's response to treatment, examination of patient, ordering and review of laboratory studies, ordering and review of radiographic studies, ordering and performing treatments and interventions, pulse oximetry, re-evaluation of patient's condition and review of old charts   ED Course / MDM   Clinical Course as of 01/02/24 1828  Sat Jan 02, 2024  1544 Pt taken at shift change for back pain and shortness of breath.  Notably hypoxic with sepsis criteria met.  Chest x-ray shows lung consolidation.  Also had back pain.  Recent urinary tract infection treated with Cipro .  [AH]  1710 Patient CT scan shows lung consolidation but also shows multifocal pneumonia versus ARDS.  Patient certainly fits that picture.  His labs show an increasing lactic acidosis.  He is currently on high flow nasal cannula 35 L requiring respiratory support likely multifactorial in the setting of his lung disease and obesity.  Patient's urine also appears to be infected.  He was treated with azithromycin  Rocephin  and neb treatments.  Patient receiving IV fluids for  blood pressure support.  I discussed the case via phone call with Dr. Jackquline freud of radiology who informed me of the abnormal findings.  I also discussed the case with NP Whitney from critical care medicine who will see the patient.  He has required multiple reevaluations at bedside [AH]  1828 I reviewed the patient's images and agree with radiologic interpretation. [AH]    Clinical Course User Index [AH] Arloa Chroman, PA-C   Medical Decision Making Amount and/or Complexity of Data Reviewed Labs: ordered. Radiology: ordered.  Risk OTC drugs. Prescription drug management. Decision regarding hospitalization.           Arloa Chroman, PA-C 01/02/24 Patrick Baxter    Patrick Simmonds, MD 01/03/24 [phone]

## 2024-01-02 NOTE — ED Notes (Signed)
 CCMD notified. Pt is on monitor.

## 2024-01-02 NOTE — ED Triage Notes (Signed)
 Pt bib PTAR from home originally caLLED out for back pain that started  Monday 10/10 stabbing pain. Pt denies fall/injuries. Pt had telehealth and was prescribed prednisone  and oxycodone, but meds did not seem to help. Pt reports cough. Pt was on 68% RA when ptar arrived. Pt was placd on NRB and is now at 90%. Pt reports that he has recurrent utis aand is currently getting over one.    HX of sleep apnea, pt has not been using machine for 3 days because mask fell off  No cardiac hx that patient is aware of  PTAR VS 160 PALPATED 160  RR24 CBG 186 101 HR

## 2024-01-03 DIAGNOSIS — J188 Other pneumonia, unspecified organism: Secondary | ICD-10-CM

## 2024-01-03 DIAGNOSIS — G4733 Obstructive sleep apnea (adult) (pediatric): Secondary | ICD-10-CM | POA: Diagnosis not present

## 2024-01-03 DIAGNOSIS — J189 Pneumonia, unspecified organism: Secondary | ICD-10-CM | POA: Diagnosis not present

## 2024-01-03 DIAGNOSIS — J9601 Acute respiratory failure with hypoxia: Secondary | ICD-10-CM | POA: Diagnosis not present

## 2024-01-03 LAB — BASIC METABOLIC PANEL WITH GFR
Anion gap: 8 (ref 5–15)
BUN: 25 mg/dL — ABNORMAL HIGH (ref 6–20)
CO2: 20 mmol/L — ABNORMAL LOW (ref 22–32)
Calcium: 8.3 mg/dL — ABNORMAL LOW (ref 8.9–10.3)
Chloride: 108 mmol/L (ref 98–111)
Creatinine, Ser: 0.91 mg/dL (ref 0.61–1.24)
GFR, Estimated: >60 mL/min (ref >60)
Glucose, Bld: 259 mg/dL — ABNORMAL HIGH (ref 70–99)
Potassium: 4.2 mmol/L (ref 3.5–5.1)
Sodium: 136 mmol/L (ref 135–145)

## 2024-01-03 LAB — CBC
HCT: 40.1 % (ref 39.0–52.0)
Hemoglobin: 13.6 g/dL (ref 13.0–17.0)
MCH: 31.9 pg (ref 26.0–34.0)
MCHC: 33.9 g/dL (ref 30.0–36.0)
MCV: 93.9 fL (ref 80.0–100.0)
Platelets: 112 10*3/uL — ABNORMAL LOW (ref 150–400)
RBC: 4.27 MIL/uL (ref 4.22–5.81)
RDW: 14.1 % (ref 11.5–15.5)
WBC: 9 10*3/uL (ref 4.0–10.5)
nRBC: 0 % (ref 0.0–0.2)

## 2024-01-03 LAB — GLUCOSE, CAPILLARY
Glucose-Capillary: 165 mg/dL — ABNORMAL HIGH (ref 70–99)
Glucose-Capillary: 200 mg/dL — ABNORMAL HIGH (ref 70–99)
Glucose-Capillary: 205 mg/dL — ABNORMAL HIGH (ref 70–99)
Glucose-Capillary: 225 mg/dL — ABNORMAL HIGH (ref 70–99)
Glucose-Capillary: 245 mg/dL — ABNORMAL HIGH (ref 70–99)
Glucose-Capillary: 283 mg/dL — ABNORMAL HIGH (ref 70–99)
Glucose-Capillary: 311 mg/dL — ABNORMAL HIGH (ref 70–99)

## 2024-01-03 LAB — STREP PNEUMONIAE URINARY ANTIGEN: Strep Pneumo Urinary Antigen: NEGATIVE

## 2024-01-03 LAB — PHOSPHORUS: Phosphorus: 3.1 mg/dL (ref 2.5–4.6)

## 2024-01-03 LAB — MAGNESIUM: Magnesium: 2.5 mg/dL — ABNORMAL HIGH (ref 1.7–2.4)

## 2024-01-03 NOTE — Plan of Care (Signed)
   Problem: Education: Goal: Ability to describe self-care measures that may prevent or decrease complications (Diabetes Survival Skills Education) will improve Outcome: Progressing Goal: Individualized Educational Video(s) Outcome: Progressing

## 2024-01-03 NOTE — Plan of Care (Signed)
  Problem: Education: Goal: Ability to describe self-care measures that may prevent or decrease complications (Diabetes Survival Skills Education) will improve Outcome: Progressing Goal: Individualized Educational Video(s) Outcome: Progressing   Problem: Coping: Goal: Ability to adjust to condition or change in health will improve Outcome: Progressing   Problem: Fluid Volume: Goal: Ability to maintain a balanced intake and output will improve Outcome: Progressing   Problem: Metabolic: Goal: Ability to maintain appropriate glucose levels will improve Outcome: Progressing   Problem: Skin Integrity: Goal: Risk for impaired skin integrity will decrease Outcome: Progressing   Problem: Tissue Perfusion: Goal: Adequacy of tissue perfusion will improve Outcome: Progressing   Problem: Clinical Measurements: Goal: Ability to maintain clinical measurements within normal limits will improve Outcome: Progressing Goal: Will remain free from infection Outcome: Progressing Goal: Diagnostic test results will improve Outcome: Progressing Goal: Respiratory complications will improve Outcome: Progressing Goal: Cardiovascular complication will be avoided Outcome: Progressing   Problem: Coping: Goal: Level of anxiety will decrease Outcome: Progressing   Problem: Pain Managment: Goal: General experience of comfort will improve and/or be controlled Outcome: Progressing   Problem: Safety: Goal: Ability to remain free from injury will improve Outcome: Progressing   Problem: Skin Integrity: Goal: Risk for impaired skin integrity will decrease Outcome: Progressing

## 2024-01-03 NOTE — Progress Notes (Signed)
 NAME:  Patrick Baxter, MRN:  [id number], DOB:  Nov 25, 1970, LOS: 1 ADMISSION DATE:  01/02/2024, CONSULTATION DATE:  6/21 REFERRING MD:  Lang- EDPA, CHIEF COMPLAINT:  respiratory failure   History of Present Illness:  Patrick Baxter is a 53 y/o gentleman with a history of obesity, DM, HTN, OSA on CPAP who presented for back pain that was getting worse for the past few days. He noticed mild SOB and cough that began last night, and his cough made his back pain more severe. Yesterday he had an UC tele visit for back pain, radiating into hips, and he was prescribed prednisone , flexeril , and hydrocodone .  When EMS arrived he was saturating 68% when EMS arrived. He was placed on NRB and achieved saturations of 90%. In the ED he had blood cultures drawn (second set after antibiotics) , was started on ceftriaxone  and azithromycin , and was placed on HHFNC. PCCM was called for evaluation and management.   He does not smoke or vape. No new environmental exposures that he is aware of. He is not on immune suppressing meds; no personal history of autoimmune disease. He has not family history of lung disease. He works at [employer] and is around children frequently, but no specific sick contacts that he is aware of. No tick bites. No recent URI or GI symptoms.   Pertinent  Medical History  OSA DM HTN HLD obesity  Significant Hospital Events: Including procedures, antibiotic start and stop dates in addition to other pertinent events   6/21 admitted with acute hypoxic respiratory failure, CT concerning for ARDS.  Placed on [facility] admitted to ICU 6/22 no acute issues overnight oxygen weaned to 7 L nasal cannula  Interim History / Subjective:  Seen sitting up in bed with no acute complaints  Objective    Blood pressure 97/60, pulse 64, temperature 97.9 F (36.6 C), temperature source Oral, resp. rate 15, height 5' 8.5 (1.74 m), weight (!) 190.6 kg, SpO2 93%.    FiO2 (%):  [50 %-60 %] 50  % PEEP:  [5 cmH20] 5 cmH20 Pressure Support:  [5 cmH20] 5 cmH20   Intake/Output Summary (Last 24 hours) at 01/03/2024 9075 Last data filed at 01/03/2024 0047 Gross per 24 hour  Intake 573.99 ml  Output 500 ml  Net 73.99 ml   Filed Weights   01/02/24 1350 01/02/24 1907  Weight: (!) 190.5 kg (!) 190.6 kg    Examination: General: Well-appearing middle-aged male sitting up in bed in no acute distress HEENT: [place]/AT, MM pink/moist, PERRL,  Neuro: Alert and oriented x 3, nonfocal CV: s1s2 regular rate and rhythm, no murmur, rubs, or gallops,  PULM: Slightly diminished bilaterally bilaterally, no increased work of breathing, no added breath sounds GI: soft, bowel sounds active in all 4 quadrants, non-tender, non-distended, tolerating oral diet Extremities: warm/dry, no edema  Skin: no rashes or lesions  Resolved problem list   Assessment and Plan  Acute respiratory failure with hypoxia- diffuse bilateral airspace disease most likely multilobar pneumonia. DAH, autoimmune reaction, eosinophilic pneumonia to unknown exposure all possible  -RVP panel negative P: Continue supplemental oxygen, wean as able Follow up strep pneumoniae and Legionella Continue azithromycin , ceftriaxone , vanc  Continue IV Solu-Medrol  Aspiration precautions Follow cultures  Severe OSA, AHI 103.9 P: Continue with inpatient CPAP with bleed in oxygen   UTI -Present on admission P: Ceftriaxone  as above  Urine culture  DM;  anticipate hyperglycemia from steroids P: Continue SSI  CBG check ACHS  CBG goal 140-180 Hold  home Semaglutide    Back pain -Likely MSK pain, CT chest ABD pelvis negative  P:  As needed oxycodone   Gout P: Continue Allopurinol    Allergic rhinitis P: Continue Singular   Rash under pannus -Present on admission P: Nystatin  powder   H/o HTN -Home medications include metoprolol  and lisinopril  P: Continue when appropriate   Stable for transfer out of ICU, PCCM will  continue to follow for pulmonary consult  Best Practice (right click and Reselect all SmartList Selections daily)   Diet/type: clear liquids DVT prophylaxis prophylactic heparin   Pressure ulcer(s): defer to RN assessment GI prophylaxis: N/A Lines: N/A Foley:  N/A Code Status:  full code Last date of multidisciplinary goals of care discussion:  Father is surrogate decision maker-- he will update him tonight that he is being admitted to ICU. His father his is NOK and lives in assisted living at [facility].   Critical care time:  NA  Nas Wafer D. Harris, NP-C [facility] Personal contact information can be found on Amion  If no contact or response made please call [phone] 01/03/2024, 9:48 AM

## 2024-01-04 ENCOUNTER — Inpatient Hospital Stay (HOSPITAL_COMMUNITY)

## 2024-01-04 DIAGNOSIS — J9601 Acute respiratory failure with hypoxia: Secondary | ICD-10-CM | POA: Diagnosis not present

## 2024-01-04 DIAGNOSIS — R0609 Other forms of dyspnea: Secondary | ICD-10-CM | POA: Diagnosis not present

## 2024-01-04 LAB — BASIC METABOLIC PANEL WITH GFR
Anion gap: 8 (ref 5–15)
BUN: 32 mg/dL — ABNORMAL HIGH (ref 6–20)
CO2: 21 mmol/L — ABNORMAL LOW (ref 22–32)
Calcium: 8.2 mg/dL — ABNORMAL LOW (ref 8.9–10.3)
Chloride: 108 mmol/L (ref 98–111)
Creatinine, Ser: 0.98 mg/dL (ref 0.61–1.24)
GFR, Estimated: >60 mL/min (ref >60)
Glucose, Bld: 221 mg/dL — ABNORMAL HIGH (ref 70–99)
Potassium: 4.3 mmol/L (ref 3.5–5.1)
Sodium: 137 mmol/L (ref 135–145)

## 2024-01-04 LAB — ECHOCARDIOGRAM COMPLETE
Area-P 1/2: 4.49 cm2
Height: 68.5 in
S' Lateral: 3.2 cm
Weight: 6712.57 [oz_av]

## 2024-01-04 LAB — CBC
HCT: 39 % (ref 39.0–52.0)
Hemoglobin: 12.9 g/dL — ABNORMAL LOW (ref 13.0–17.0)
MCH: 31.2 pg (ref 26.0–34.0)
MCHC: 33.1 g/dL (ref 30.0–36.0)
MCV: 94.4 fL (ref 80.0–100.0)
Platelets: 125 10*3/uL — ABNORMAL LOW (ref 150–400)
RBC: 4.13 MIL/uL — ABNORMAL LOW (ref 4.22–5.81)
RDW: 14 % (ref 11.5–15.5)
WBC: 9.1 10*3/uL (ref 4.0–10.5)
nRBC: 0 % (ref 0.0–0.2)

## 2024-01-04 LAB — GLUCOSE, CAPILLARY
Glucose-Capillary: 223 mg/dL — ABNORMAL HIGH (ref 70–99)
Glucose-Capillary: 198 mg/dL — ABNORMAL HIGH (ref 70–99)
Glucose-Capillary: 194 mg/dL — ABNORMAL HIGH (ref 70–99)
Glucose-Capillary: 198 mg/dL — ABNORMAL HIGH (ref 70–99)
Glucose-Capillary: 224 mg/dL — ABNORMAL HIGH (ref 70–99)
Glucose-Capillary: 127 mg/dL — ABNORMAL HIGH (ref 70–99)

## 2024-01-04 LAB — EXPECTORATED SPUTUM ASSESSMENT W GRAM STAIN, RFLX TO RESP C

## 2024-01-04 MED ORDER — CHLORHEXIDINE GLUCONATE CLOTH 2 % EX PADS
6.0000 | MEDICATED_PAD | Freq: Every day | CUTANEOUS | Status: DC
  Administered 2024-01-04 – 2024-01-07 (×3): 6 via TOPICAL

## 2024-01-04 MED ORDER — INSULIN GLARGINE-YFGN 100 UNIT/ML ~~LOC~~ SOLN
10.0000 [IU] | Freq: Every day | SUBCUTANEOUS | Status: DC
  Administered 2024-01-04 – 2024-01-07 (×4): 10 [IU] via SUBCUTANEOUS
  Filled 2024-01-04 (×5): qty 0.1

## 2024-01-04 MED ORDER — LIDOCAINE 5 % EX PTCH
2.0000 | MEDICATED_PATCH | CUTANEOUS | Status: DC
  Administered 2024-01-04 – 2024-01-07 (×4): 2 via TRANSDERMAL
  Filled 2024-01-04 (×4): qty 2

## 2024-01-04 MED ORDER — INSULIN ASPART 100 UNIT/ML IJ SOLN
0.0000 [IU] | INTRAMUSCULAR | Status: DC
  Administered 2024-01-04: 4 [IU] via SUBCUTANEOUS
  Administered 2024-01-04: 3 [IU] via SUBCUTANEOUS
  Administered 2024-01-04: 4 [IU] via SUBCUTANEOUS
  Administered 2024-01-04: 7 [IU] via SUBCUTANEOUS
  Administered 2024-01-05 (×2): 4 [IU] via SUBCUTANEOUS
  Administered 2024-01-05: 3 [IU] via SUBCUTANEOUS
  Administered 2024-01-05: 4 [IU] via SUBCUTANEOUS
  Administered 2024-01-05 – 2024-01-06 (×2): 3 [IU] via SUBCUTANEOUS
  Administered 2024-01-06 (×2): 4 [IU] via SUBCUTANEOUS
  Administered 2024-01-07: 3 [IU] via SUBCUTANEOUS
  Administered 2024-01-07: 4 [IU] via SUBCUTANEOUS
  Administered 2024-01-07: 3 [IU] via SUBCUTANEOUS

## 2024-01-04 MED ORDER — SALINE SPRAY 0.65 % NA SOLN
1.0000 | NASAL | Status: DC | PRN
  Filled 2024-01-04 (×2): qty 44

## 2024-01-04 MED ORDER — METOPROLOL SUCCINATE ER 50 MG PO TB24
50.0000 mg | ORAL_TABLET | Freq: Every day | ORAL | Status: DC
  Administered 2024-01-04 – 2024-01-07 (×4): 50 mg via ORAL
  Filled 2024-01-04 (×5): qty 1

## 2024-01-04 MED ORDER — PERFLUTREN LIPID MICROSPHERE
1.0000 mL | INTRAVENOUS | Status: AC | PRN
  Administered 2024-01-04: 3 mL via INTRAVENOUS

## 2024-01-04 MED ORDER — LINEZOLID 600 MG PO TABS
600.0000 mg | ORAL_TABLET | Freq: Two times a day (BID) | ORAL | Status: DC
  Administered 2024-01-04 – 2024-01-07 (×7): 600 mg via ORAL
  Filled 2024-01-04 (×8): qty 1

## 2024-01-04 NOTE — Evaluation (Signed)
 Physical Therapy Evaluation Patient Details Name: Patrick Baxter MRN: [id number] DOB: 1970-07-27 Today's Date: 01/04/2024  History of Present Illness  53 yo male adm 01/02/24 with back pain, SOB, hypoxia, CAP, UTI. PMhx; obesity, OSA, DM, HTN, HLD  Clinical Impression  Pt pleasant stating he lives alone, works at [employer] and has been having increased back pain which was much better today. Pt able to get OOB and walk short hallway distance limited by SPO2 desaturation requiring up to 6L and fatigue. Encouraged OOB and increased mobility with nursing. Pt with decreased activity tolerance, function and strength who will benefit from acute therapy to maximize mobility and independence to allow return home with HHPT.   Supine 140/84 (101) Sitting 133/116 (122) HR 86-105 SPO2 90% on 4L at rest desaturation to 84% with gait requiring 6L for return gait        If plan is discharge home, recommend the following: A little help with walking and/or transfers;A little help with bathing/dressing/bathroom;Assistance with cooking/housework   Can travel by private Tax inspector (2 wheels)  Recommendations for Other Services  OT consult    Functional Status Assessment Patient has had a recent decline in their functional status and demonstrates the ability to make significant improvements in function in a reasonable and predictable amount of time.     Precautions / Restrictions Precautions Precautions: Other (comment) Recall of Precautions/Restrictions: Intact Precaution/Restrictions Comments: watch sats      Mobility  Bed Mobility Overal bed mobility: Needs Assistance Bed Mobility: Supine to Sit     Supine to sit: Supervision, HOB elevated, Used rails     General bed mobility comments: HOB 40 degrees, use of rail and increased time    Transfers Overall transfer level: Needs assistance   Transfers: Sit to/from Stand Sit to Stand:  Contact guard assist           General transfer comment: CGA for lines and cues for safety    Ambulation/Gait Ambulation/Gait assistance: Min assist, +2 safety/equipment Gait Distance (Feet): 70 Feet Assistive device: Rolling walker (2 wheels) Gait Pattern/deviations: Step-through pattern, Decreased stride length   Gait velocity interpretation: <1.8 ft/sec, indicate of risk for recurrent falls   General Gait Details: pt with reliance on RW and required 4-6L O2 for gait to maintain SPO2 >90%. Pt with chair follow and required seated rest after 70' then able to make return trip back to room. cues for breathing technique and safety  Stairs            Wheelchair Mobility     Tilt Bed    Modified Rankin (Stroke Patients Only)       Balance Overall balance assessment: Mild deficits observed, not formally tested                                           Pertinent Vitals/Pain Pain Assessment Pain Assessment: 0-10 Pain Score: 2  Pain Location: LBP Pain Descriptors / Indicators: Aching Pain Intervention(s): Limited activity within patient's tolerance, Monitored during session, Repositioned    Home Living Family/patient expects to be discharged to:: Private residence Living Arrangements: Alone Available Help at Discharge: Friend(s);Available PRN/intermittently Type of Home: House Home Access: Stairs to enter Entrance Stairs-Rails: Right Entrance Stairs-Number of Steps: 6   Home Layout: One level Home Equipment: Cane - single point  Prior Function Prior Level of Function : Independent/Modified Independent;Driving;Working/employed             Mobility Comments: pt has been using cane lately due to back pain, works as Administrator, sports Extremity Assessment Upper Extremity Assessment: Generalized weakness    Lower Extremity Assessment Lower Extremity Assessment: Generalized weakness     Cervical / Trunk Assessment Cervical / Trunk Assessment: Normal;Other exceptions Cervical / Trunk Exceptions: increased body habitus  Communication   Communication Communication: No apparent difficulties    Cognition Arousal: Alert Behavior During Therapy: WFL for tasks assessed/performed   PT - Cognitive impairments: No apparent impairments                         Following commands: Intact       Cueing Cueing Techniques: Verbal cues     General Comments      Exercises     Assessment/Plan    PT Assessment Patient needs continued PT services  PT Problem List Decreased strength;Decreased activity tolerance;Decreased balance;Decreased mobility;Decreased knowledge of use of DME       PT Treatment Interventions DME instruction;Stair training;Functional mobility training;Therapeutic activities;Patient/family education;Gait training    PT Goals (Current goals can be found in the Care Plan section)  Acute Rehab PT Goals Patient Stated Goal: return home and to work PT Goal Formulation: With patient Time For Goal Achievement: 01/18/24 Potential to Achieve Goals: Good    Frequency Min 2X/week     Co-evaluation               AM-PAC PT 6 Clicks Mobility  Outcome Measure Help needed turning from your back to your side while in a flat bed without using bedrails?: A Little Help needed moving from lying on your back to sitting on the side of a flat bed without using bedrails?: A Little Help needed moving to and from a bed to a chair (including a wheelchair)?: A Little Help needed standing up from a chair using your arms (e.g., wheelchair or bedside chair)?: A Little Help needed to walk in hospital room?: A Little Help needed climbing 3-5 steps with a railing? : A Little 6 Click Score: 18    End of Session Equipment Utilized During Treatment: Oxygen Activity Tolerance: Patient tolerated treatment well Patient left: in chair;with call bell/phone within  reach Nurse Communication: Mobility status PT Visit Diagnosis: Other abnormalities of gait and mobility (R26.89)    Time: 9154-9089 PT Time Calculation (min) (ACUTE ONLY): 25 min   Charges:   PT Evaluation $PT Eval Moderate Complexity: 1 Mod PT Treatments $Gait Training: 8-22 mins PT General Charges $$ ACUTE PT VISIT: 1 Visit         Lenoard SQUIBB, PT Acute Rehabilitation Services Office: [phone]   Lenoard KATHEE Docker 01/04/2024, 10:43 AM

## 2024-01-04 NOTE — Progress Notes (Signed)
 Echocardiogram 2D Echocardiogram has been performed.  Koleen KANDICE Popper, RDCS 01/04/2024, 3:09 PM

## 2024-01-04 NOTE — Progress Notes (Addendum)
   01/04/24 2220  BiPAP/CPAP/SIPAP  $ Face Mask Large  Yes  BiPAP/CPAP/SIPAP Pt Type Adult  BiPAP/CPAP/SIPAP SERVO  Mask Type Full face mask  Dentures removed? Not applicable  Mask Size Large  Respiratory Rate 22 breaths/min  Pressure Support 3 cmH20  PEEP 5 cmH20  FiO2 (%) 50 %  Minute Ventilation 15.1  Leak 36  Peak Inspiratory Pressure (PIP) 9  Tidal Volume (Vt) 832  Patient Home Machine No  Patient Home Mask No  Patient Home Tubing No  Press High Alarm 30 cmH2O  Press Low Alarm 5 cmH2O  Device Plugged into RED Power Outlet Yes

## 2024-01-04 NOTE — Progress Notes (Signed)
 PROGRESS NOTE  Patrick Baxter FMW:[id number] DOB: 07/24/1970 DOA: 01/02/2024 PCP: Bulah Alm RAMAN, PA-C  HPI/Recap of past 24 hours: Mr. Patrick Baxter is a 53 y/o gentleman with a history of obesity, DM, HTN, OSA on CPAP who presented for worsening back pain and some shortness of breath and has been ongoing for the past few days.  Patient had a tele visit for back pain, radiating into hips, and he was prescribed prednisone , flexeril , and hydrocodone .  Patient does not smoke or vape.  Patient works at [employer] and is frequently around kids.  Due to worsening pain, EMS was activated, noted he was saturating at 68% on room air, placed on NRB and achieved saturations of 90%. In the ED, patient was placed on heated high flow nasal cannula, PCCM consulted for admission further management.  Patient was able to be weaned off to about 6 L of nasal cannula and further stabilized.  [facility] hospitalist assumed care on 01/04/2024.   Today, patient reports back pain has improved, still noted to be short of breath especially on ambulation requiring about 6 L.  Patient denies any chest pain, abdominal pain, nausea/vomiting, fever/chills.    Assessment/Plan: Principal Problem:   Acute respiratory failure with hypoxia (HCC) Active Problems:   Multifocal pneumonia   Acute respiratory failure with hypoxia Diffuse bilateral airspace disease most likely multilobar pneumonia/ARDS Currently on 4 L of O2 at rest, and requiring about 6 L for ambulation RVP panel, urine strep pneumo negative, urine Legionella pending BC x 2 NGTD CTA chest, negative for PE, noted above Continue azithromycin , ceftriaxone , d/c vanc--> switch to p.o. linezolid Continue IV Solu-Medrol  Aspiration precautions Continue supplemental O2 and wean once able   E. coli UTI Urine culture grew greater than 100,000 and E. coli, awaiting susceptibility CT abdomen/pelvis, with bilateral nonobstructing renal calculi, otherwise unremarkable Continue  ceftriaxone  for now  Elevated troponin 137--100, noted flat trend Denies any chest pain, but still SOB BNP 347 EKG with no acute ST changes Last echo done in 2019 showed EF of 65 to 70%, no regional wall motion abnormality, grade 1 diastolic dysfunction, repeat echo pending Telemetry   DM type 2 Last A1c on 01/02/2024 was 6.3 CBGs uncontrolled likely 2/2 steroid use Continue SSI every 4 hours, Semglee, Accu-Cheks, hypoglycemic protocol Hold home Semaglutide    Hypertension BP stable Start PTA Toprol  50 mg daily, continue to hold lisinopril  20 mg daily  Hypothyroidism Continue Synthroid    Back pain Pain management PT/OT   Gout Continue Allopurinol     Allergic rhinitis Continue Singular    Rash under pannus Present on admission Continue nystatin  powder    Severe OSA Continue with inpatient CPAP with oxygen   Morbid obesity Lifestyle modification advised     Estimated body mass index is 62.86 kg/m as calculated from the following:   Height as of this encounter: 5' 8.5 (1.74 m).   Weight as of this encounter: 190.3 kg.     Code Status: Full  Family Communication: None at bedside  Disposition Plan: Status is: Inpatient Remains inpatient appropriate because: Level of care      Consultants: PCCM  Procedures: None  Antimicrobials: Azithromycin , ceftriaxone , linezolid  DVT prophylaxis: Heparin  [place]   Objective: Vitals:   01/04/24 1100 01/04/24 1101 01/04/24 1119 01/04/24 1200  BP: 138/83   (!) 142/80  Pulse: 100 89 83 80  Resp: 13 (!) 26 19 19   Temp:   97.6 F (36.4 C)   TempSrc:   Oral   SpO2: (!) 88%  94% 92% 92%  Weight:      Height:        Intake/Output Summary (Last 24 hours) at 01/04/2024 1216 Last data filed at 01/04/2024 1100 Gross per 24 hour  Intake 1012.82 ml  Output 1300 ml  Net -287.18 ml   Filed Weights   01/02/24 1350 01/02/24 1907 01/04/24 0500  Weight: (!) 190.5 kg (!) 190.6 kg (!) 190.3 kg    Exam: General: NAD,  morbidly obese Cardiovascular: S1, S2 present Respiratory: Diminished breath sounds bilaterally Abdomen: Soft, nontender, nondistended, bowel sounds present Musculoskeletal: bilateral pedal edema noted Skin: Normal Psychiatry: Normal mood     Data Reviewed: CBC: Recent Labs  Lab 01/02/24 1355 01/02/24 1545 01/02/24 1705 01/03/24 0307 01/04/24 0435  WBC 12.9*  --   --  9.0 9.1  NEUTROABS 11.3*  --   --   --   --   HGB 14.2 12.2* 13.6 13.6 12.9*  HCT 41.4 36.0* 40.0 40.1 39.0  MCV 91.6  --   --  93.9 94.4  PLT 125*  --   --  112* 125*   Basic Metabolic Panel: Recent Labs  Lab 01/02/24 1355 01/02/24 1545 01/02/24 1705 01/03/24 0307 01/04/24 0435  NA 137 135 137 136 137  K 4.1 3.7 4.1 4.2 4.3  CL 104  --   --  108 108  CO2 19*  --   --  20* 21*  GLUCOSE 198*  --   --  259* 221*  BUN 21*  --   --  25* 32*  CREATININE 1.09  --   --  0.91 0.98  CALCIUM  8.3*  --   --  8.3* 8.2*  MG  --   --   --  2.5*  --   PHOS  --   --   --  3.1  --    GFR: Estimated Creatinine Clearance: 143.7 mL/min (by C-G formula based on SCr of 0.98 mg/dL). Liver Function Tests: Recent Labs  Lab 01/02/24 1355  AST 40  ALT 35  ALKPHOS 68  BILITOT 0.9  PROT 6.6  ALBUMIN 2.5*   No results for input(s): LIPASE, AMYLASE in the last 168 hours. No results for input(s): AMMONIA in the last 168 hours. Coagulation Profile: Recent Labs  Lab 01/02/24 1959  INR 1.3*   Cardiac Enzymes: No results for input(s): CKTOTAL, CKMB, CKMBINDEX, TROPONINI in the last 168 hours. BNP (last 3 results) No results for input(s): PROBNP in the last 8760 hours. HbA1C: Recent Labs    01/02/24 1959  HGBA1C 6.3*   CBG: Recent Labs  Lab 01/03/24 1935 01/03/24 2341 01/04/24 0321 01/04/24 0719 01/04/24 1122  GLUCAP 165* 245* 223* 198* 194*   Lipid Profile: No results for input(s): CHOL, HDL, LDLCALC, TRIG, CHOLHDL, LDLDIRECT in the last 72 hours. Thyroid Function Tests: No  results for input(s): TSH, T4TOTAL, FREET4, T3FREE, THYROIDAB in the last 72 hours. Anemia Panel: No results for input(s): VITAMINB12, FOLATE, FERRITIN, TIBC, IRON, RETICCTPCT in the last 72 hours. Urine analysis:    Component Value Date/Time   COLORURINE AMBER (A) 01/02/2024 1405   APPEARANCEUR CLOUDY (A) 01/02/2024 1405   APPEARANCEUR CANCELED 12/11/2023 1004   LABSPEC 1.025 01/02/2024 1405   LABSPEC 1.015 12/11/2023 0958   PHURINE 5.0 01/02/2024 1405   GLUCOSEU NEGATIVE 01/02/2024 1405   HGBUR LARGE (A) 01/02/2024 1405   BILIRUBINUR NEGATIVE 01/02/2024 1405   BILIRUBINUR CANCELED 12/11/2023 1004   BILIRUBINUR negative 12/11/2023 0958   KETONESUR NEGATIVE 01/02/2024 1405  PROTEINUR 100 (A) 01/02/2024 1405   UROBILINOGEN negative (A) 10/28/2016 1044   NITRITE NEGATIVE 01/02/2024 1405   LEUKOCYTESUR LARGE (A) 01/02/2024 1405   Sepsis Labs: @LABRCNTIP (procalcitonin:4,lacticidven:4)  ) Recent Results (from the past 240 hours)  Resp panel by RT-PCR (RSV, Flu A&B, Covid) Anterior Nasal Swab     Status: None   Collection Time: 01/02/24  1:55 PM   Specimen: Anterior Nasal Swab  Result Value Ref Range Status   SARS Coronavirus 2 by RT PCR NEGATIVE NEGATIVE Final   Influenza A by PCR NEGATIVE NEGATIVE Final   Influenza B by PCR NEGATIVE NEGATIVE Final    Comment: (NOTE) The Xpert Xpress SARS-CoV-2/FLU/RSV plus assay is intended as an aid in the diagnosis of influenza from Nasopharyngeal swab specimens and should not be used as a sole basis for treatment. Nasal washings and aspirates are unacceptable for Xpert Xpress SARS-CoV-2/FLU/RSV testing.  Fact Sheet for Patients: BloggerCourse.com  Fact Sheet for Healthcare Providers: SeriousBroker.it  This test is not yet approved or cleared by the [place]  FDA and has been authorized for detection and/or diagnosis of SARS-CoV-2 by FDA under an Emergency Use  Authorization (EUA). This EUA will remain in effect (meaning this test can be used) for the duration of the COVID-19 declaration under Section 564(b)(1) of the Act, 21 U.S.C. section 360bbb-3(b)(1), unless the authorization is terminated or revoked.     Resp Syncytial Virus by PCR NEGATIVE NEGATIVE Final    Comment: (NOTE) Fact Sheet for Patients: BloggerCourse.com  Fact Sheet for Healthcare Providers: SeriousBroker.it  This test is not yet approved or cleared by the [place]  FDA and has been authorized for detection and/or diagnosis of SARS-CoV-2 by FDA under an Emergency Use Authorization (EUA). This EUA will remain in effect (meaning this test can be used) for the duration of the COVID-19 declaration under Section 564(b)(1) of the Act, 21 U.S.C. section 360bbb-3(b)(1), unless the authorization is terminated or revoked.  Performed at [facility], [address] [place], [place] [address]   Culture, blood (Routine x 2)     Status: None (Preliminary result)   Collection Time: 01/02/24  1:57 PM   Specimen: BLOOD RIGHT HAND  Result Value Ref Range Status   Specimen Description BLOOD RIGHT HAND  Final   Special Requests   Final    BOTTLES DRAWN AEROBIC AND ANAEROBIC Blood Culture adequate volume   Culture   Final    NO GROWTH 2 DAYS Performed at [facility], [address], [place], [place] [address]    Report Status PENDING  Incomplete  Culture, blood (Routine x 2)     Status: None (Preliminary result)   Collection Time: 01/02/24  2:02 PM   Specimen: BLOOD RIGHT HAND  Result Value Ref Range Status   Specimen Description BLOOD RIGHT HAND  Final   Special Requests   Final    BOTTLES DRAWN AEROBIC AND ANAEROBIC Blood Culture results may not be optimal due to an inadequate volume of blood received in culture bottles   Culture   Final    NO GROWTH 2 DAYS Performed at [facility], [address]  [place], [place] [address]    Report Status PENDING  Incomplete  Urine Culture     Status: Abnormal (Preliminary result)   Collection Time: 01/02/24  2:05 PM   Specimen: Urine, Random  Result Value Ref Range Status   Specimen Description URINE, RANDOM  Final   Special Requests NONE Reflexed from [phone]  Final   Culture (A)  Final    >=100,000 COLONIES/mL ESCHERICHIA COLI CONFIRMATION OF SUSCEPTIBILITIES IN PROGRESS Performed at [facility], [address] [place], [place] [address]    Report Status PENDING  Incomplete  Respiratory (~20 pathogens) panel by PCR     Status: None   Collection Time: 01/02/24  5:30 PM   Specimen: Nasopharyngeal Swab; Respiratory  Result Value Ref Range Status   Adenovirus NOT DETECTED NOT DETECTED Final   Coronavirus 229E NOT DETECTED NOT DETECTED Final    Comment: (NOTE) The Coronavirus on the Respiratory Panel, DOES NOT test for the novel  Coronavirus (2019 nCoV)    Coronavirus HKU1 NOT DETECTED NOT DETECTED Final   Coronavirus NL63 NOT DETECTED NOT DETECTED Final   Coronavirus OC43 NOT DETECTED NOT DETECTED Final   Metapneumovirus NOT DETECTED NOT DETECTED Final   Rhinovirus / Enterovirus NOT DETECTED NOT DETECTED Final   Influenza A NOT DETECTED NOT DETECTED Final   Influenza B NOT DETECTED NOT DETECTED Final   Parainfluenza Virus 1 NOT DETECTED NOT DETECTED Final   Parainfluenza Virus 2 NOT DETECTED NOT DETECTED Final   Parainfluenza Virus 3 NOT DETECTED NOT DETECTED Final   Parainfluenza Virus 4 NOT DETECTED NOT DETECTED Final   Respiratory Syncytial Virus NOT DETECTED NOT DETECTED Final   Bordetella pertussis NOT DETECTED NOT DETECTED Final   Bordetella Parapertussis NOT DETECTED NOT DETECTED Final   Chlamydophila pneumoniae NOT DETECTED NOT DETECTED Final   Mycoplasma pneumoniae NOT DETECTED NOT DETECTED Final    Comment: Performed at [facility], [address], [place], [place] [address]  SARS Coronavirus 2 by RT PCR (hospital  order, performed in [facility] hospital lab) *cepheid single result test* Anterior Nasal Swab     Status: None   Collection Time: 01/02/24  5:30 PM   Specimen: Anterior Nasal Swab  Result Value Ref Range Status   SARS Coronavirus 2 by RT PCR NEGATIVE NEGATIVE Final    Comment: Performed at [facility], [address] [place], [place] [address]  MRSA Next Gen by PCR, Nasal     Status: Abnormal   Collection Time: 01/02/24  5:30 PM   Specimen: Nasal Mucosa; Nasal Swab  Result Value Ref Range Status   MRSA by PCR Next Gen DETECTED (A) NOT DETECTED Final    Comment: RESULT CALLED TO, READ BACK BY AND VERIFIED WITH: RN VINA DEL 2106 [phone] FCP (NOTE) The GeneXpert MRSA Assay (FDA approved for NASAL specimens only), is one component of a comprehensive MRSA colonization surveillance program. It is not intended to diagnose MRSA infection nor to guide or monitor treatment for MRSA infections. Test performance is not FDA approved in patients less than 11 years old. Performed at [facility], [address] [place], [place] [address]       Studies: No results found.  Scheduled Meds:  allopurinol   300 mg Oral Daily   Chlorhexidine Gluconate Cloth  6 each Topical Daily   FLUoxetine   40 mg Oral Daily   heparin   5,000 Units Subcutaneous Q8H   insulin  aspart  0-20 Units Subcutaneous Q4H   insulin  glargine-yfgn  10 Units Subcutaneous Daily   levothyroxine   25 mcg Oral Q0600   lidocaine   2 patch Transdermal Q24H   linezolid  600 mg Oral Q12H   loratadine   10 mg Oral Daily   methylPREDNISolone  (SOLU-MEDROL ) injection  40 mg Intravenous Q24H   montelukast   10 mg Oral QHS  nystatin    Topical BID    Continuous Infusions:  azithromycin  Stopped (01/03/24 1541)   cefTRIAXone  (ROCEPHIN )  IV Stopped (01/04/24 1053)     LOS: 2 days     Lebron JINNY Cage, MD [facility]  If 7PM-7AM, please contact night-coverage [email or web address] 01/04/2024, 12:16 PM

## 2024-01-04 NOTE — Progress Notes (Signed)
 NAME:  Patrick Baxter, MRN:  [id number], DOB:  04/11/1971, LOS: 2 ADMISSION DATE:  01/02/2024, CONSULTATION DATE:  6/21 REFERRING MD:  Lang- EDPA, CHIEF COMPLAINT:  respiratory failure   History of Present Illness:  Patrick Baxter is a 53 y/o gentleman with a history of obesity, DM, HTN, OSA on CPAP who presented for back pain that was getting worse for the past few days. He noticed mild SOB and cough that began last night, and his cough made his back pain more severe. Yesterday he had an UC tele visit for back pain, radiating into hips, and he was prescribed prednisone , flexeril , and hydrocodone .  When EMS arrived he was saturating 68% when EMS arrived. He was placed on NRB and achieved saturations of 90%. In the ED he had blood cultures drawn (second set after antibiotics) , was started on ceftriaxone  and azithromycin , and was placed on HHFNC. PCCM was called for evaluation and management.   He does not smoke or vape. No new environmental exposures that he is aware of. He is not on immune suppressing meds; no personal history of autoimmune disease. He has not family history of lung disease. He works at [employer] at [employer] and is around children frequently, but no specific sick contacts that he is aware of. No tick bites. No recent URI or GI symptoms.   Pertinent  Medical History  OSA DM HTN HLD obesity  Significant Hospital Events: Including procedures, antibiotic start and stop dates in addition to other pertinent events   6/21 admitted with acute hypoxic respiratory failure, CT concerning for ARDS.  Placed on [facility] admitted to ICU 6/22 no acute issues overnight oxygen weaned to 7 L nasal cannula  Interim History / Subjective:  Seen sitting up in bed with no acute complaints  Objective    Blood pressure (!) 159/82, pulse 86, temperature 98 F (36.7 C), temperature source Axillary, resp. rate 15, height 5' 8.5 (1.74 m), weight (!) 190.3 kg, SpO2 93%.    FiO2 (%):  [50 %] 50  % PEEP:  [5 cmH20] 5 cmH20 Pressure Support:  [3 cmH20-5 cmH20] 3 cmH20   Intake/Output Summary (Last 24 hours) at 01/04/2024 1033 Last data filed at 01/04/2024 1000 Gross per 24 hour  Intake 1047.34 ml  Output 1300 ml  Net -252.66 ml   Filed Weights   01/02/24 1350 01/02/24 1907 01/04/24 0500  Weight: (!) 190.5 kg (!) 190.6 kg (!) 190.3 kg    Examination: General: Well-appearing middle-aged male sitting up in bed in no acute distress HEENT: [place]/AT, MM pink/moist, PERRL,  Neuro: Alert and oriented x 3, nonfocal CV: s1s2 regular rate and rhythm, no murmur, rubs, or gallops,  PULM: Slightly diminished bilaterally bilaterally, no increased work of breathing, no added breath sounds GI: soft, bowel sounds active in all 4 quadrants, non-tender, non-distended, tolerating oral diet Extremities: warm/dry, no edema  Skin: no rashes or lesions  Resolved problem list   Assessment and Plan  Acute respiratory failure with hypoxia- diffuse bilateral airspace disease most likely multilobar pneumonia. -RVP panel negative, strep pneumoniae urine ag negative P: Continue supplemental oxygen, wean as able Follow up Legionella urine ag Continue azithromycin , ceftriaxone , vanc  Continue IV Solu-Medrol  Aspiration precautions Follow cultures  Severe OSA, AHI 103.9 P: Continue with inpatient CPAP with bleed in oxygen    Rest per Primary Team UTI DM, hyperglycemia from steroids Back pain out Allergic rhinitis Rash under pannus H/o HTN  PCCM will continue to follow, he will need outpatient follow  up in clinic  Critical care time:  NA   Dorn Chill, MD [facility] Office: [phone]   See Amion for personal pager PCCM on call pager [phone] until 7pm. Please call Elink 7p-7a. [phone]

## 2024-01-04 NOTE — Plan of Care (Signed)
  Problem: Education: Goal: Ability to describe self-care measures that may prevent or decrease complications (Diabetes Survival Skills Education) will improve Outcome: Progressing   Problem: Coping: Goal: Ability to adjust to condition or change in health will improve Outcome: Progressing   Problem: Fluid Volume: Goal: Ability to maintain a balanced intake and output will improve Outcome: Progressing   Problem: Metabolic: Goal: Ability to maintain appropriate glucose levels will improve Outcome: Progressing   Problem: Nutritional: Goal: Maintenance of adequate nutrition will improve Outcome: Progressing   Problem: Education: Goal: Knowledge of General Education information will improve Description: Including pain rating scale, medication(s)/side effects and non-pharmacologic comfort measures Outcome: Progressing   Problem: Activity: Goal: Risk for activity intolerance will decrease Outcome: Progressing   Problem: Activity: Goal: Risk for activity intolerance will decrease Outcome: Progressing   Problem: Nutrition: Goal: Adequate nutrition will be maintained Outcome: Progressing

## 2024-01-04 NOTE — Evaluation (Signed)
 Occupational Therapy Evaluation Patient Details Name: Patrick Baxter MRN: [id number] DOB: 1971/06/03 Today's Date: 01/04/2024   History of Present Illness   53 yo male adm 01/02/24 with back pain, SOB, hypoxia, CAP, UTI. PMhx; obesity, OSA, DM, HTN, HLD     Clinical Impressions Pt ind at baseline with ADLs was using a cane this past week for mobility due to back pain. Pt lives alone. Pt currently with decr activity tolerance, needing up to min A for ADLs, CGA for bed mobility and CGA for transfers with RW. Pt needs up to 6L o2 to sustain SpO2 in 90s with mobility, pt reports dizziness and needs cues for PLB throughout. Needing x2 standing rest breaks with mobility. Pt presenting with impairments listed below, will follow acutely. Recommend HHOT at d/c.      If plan is discharge home, recommend the following:   A little help with walking and/or transfers;A lot of help with bathing/dressing/bathroom;Assistance with cooking/housework;Assist for transportation;Help with stairs or ramp for entrance     Functional Status Assessment   Patient has had a recent decline in their functional status and demonstrates the ability to make significant improvements in function in a reasonable and predictable amount of time.     Equipment Recommendations   BSC/3in1     Recommendations for Other Services   PT consult     Precautions/Restrictions   Precautions Precautions: Other (comment) Recall of Precautions/Restrictions: Intact Precaution/Restrictions Comments: watch sats Restrictions Weight Bearing Restrictions Per Provider Order: No     Mobility Bed Mobility Overal bed mobility: Needs Assistance Bed Mobility: Supine to Sit, Sit to Supine     Supine to sit: Contact guard Sit to supine: Contact guard assist        Transfers Overall transfer level: Needs assistance Equipment used: Rolling walker (2 wheels) Transfers: Sit to/from Stand Sit to Stand: Contact guard assist                   Balance Overall balance assessment: Mild deficits observed, not formally tested                                         ADL either performed or assessed with clinical judgement   ADL Overall ADL's : Needs assistance/impaired Eating/Feeding: Set up;Sitting   Grooming: Set up;Oral care;Standing   Upper Body Bathing: Minimal assistance;Sitting   Lower Body Bathing: Sitting/lateral leans;Moderate assistance   Upper Body Dressing : Minimal assistance;Sitting   Lower Body Dressing: Sitting/lateral leans;Moderate assistance   Toilet Transfer: Contact guard assist;Ambulation;Rolling walker (2 wheels)           Functional mobility during ADLs: Contact guard assist;Rolling walker (2 wheels)       Vision   Vision Assessment?: No apparent visual deficits     Perception Perception: Not tested       Praxis Praxis: Not tested       Pertinent Vitals/Pain Pain Assessment Pain Assessment: No/denies pain     Extremity/Trunk Assessment Upper Extremity Assessment Upper Extremity Assessment: Generalized weakness   Lower Extremity Assessment Lower Extremity Assessment: Defer to PT evaluation   Cervical / Trunk Assessment Cervical / Trunk Assessment: Normal;Other exceptions Cervical / Trunk Exceptions: increased body habitus   Communication Communication Communication: No apparent difficulties   Cognition Arousal: Alert Behavior During Therapy: WFL for tasks assessed/performed Cognition: No apparent impairments  Following commands: Intact       Cueing  General Comments   Cueing Techniques: Verbal cues  VSS on 6L O2 with mobility   Exercises     Shoulder Instructions      Home Living Family/patient expects to be discharged to:: Private residence Living Arrangements: Alone Available Help at Discharge: Friend(s);Available PRN/intermittently Type of Home: House Home Access:  Stairs to enter [employer] of Steps: 6 Entrance Stairs-Rails: Right Home Layout: One level     Bathroom Shower/Tub: Producer, television/film/video: Standard     Home Equipment: Cane - single point          Prior Functioning/Environment Prior Level of Function : Independent/Modified Independent;Driving;Working/employed             Mobility Comments: pt has been using cane lately due to back pain, works as Scientist, clinical (histocompatibility and immunogenetics) ADLs Comments: ind    OT Problem List: Decreased strength;Decreased range of motion;Decreased activity tolerance;Impaired balance (sitting and/or standing);Decreased cognition;Decreased safety awareness;Cardiopulmonary status limiting activity;Impaired UE functional use   OT Treatment/Interventions: Self-care/ADL training;Therapeutic exercise;DME and/or AE instruction;Energy conservation;Therapeutic activities;Patient/family education;Balance training      OT Goals(Current goals can be found in the care plan section)   Acute Rehab OT Goals Patient Stated Goal: none stated OT Goal Formulation: With patient Time For Goal Achievement: 01/18/24 Potential to Achieve Goals: Good ADL Goals Pt Will Perform Upper Body Dressing: with supervision;sitting Pt Will Perform Lower Body Dressing: with supervision;sit to/from stand;sitting/lateral leans Pt Will Transfer to Toilet: with supervision;ambulating;regular height toilet Additional ADL Goal #1: pt will verbalize x3 energy conservation strategies in prep for ADLs   OT Frequency:  Min 2X/week    Co-evaluation              AM-PAC OT 6 Clicks Daily Activity     Outcome Measure Help from another person eating meals?: None Help from another person taking care of personal grooming?: A Little Help from another person toileting, which includes using toliet, bedpan, or urinal?: A Little Help from another person bathing (including washing, rinsing, drying)?: A Lot Help from another person  to put on and taking off regular upper body clothing?: A Little Help from another person to put on and taking off regular lower body clothing?: A Lot 6 Click Score: 17   End of Session Equipment Utilized During Treatment: Gait belt;Rolling walker (2 wheels);Oxygen Nurse Communication: Mobility status  Activity Tolerance: Patient tolerated treatment well Patient left: in bed;with call bell/phone within reach  OT Visit Diagnosis: Unsteadiness on feet (R26.81);Other abnormalities of gait and mobility (R26.89);Muscle weakness (generalized) (M62.81)                Time: 8492-8462 OT Time Calculation (min): 30 min Charges:  OT General Charges $OT Visit: 1 Visit OT Evaluation $OT Eval Moderate Complexity: 1 Mod OT Treatments $Self Care/Home Management : 8-22 mins  Marcellius Montagna K, OTD, OTR/L SecureChat Preferred Acute Rehab [phone]   Laneta MARLA Pereyra 01/04/2024, 3:47 PM

## 2024-01-05 ENCOUNTER — Inpatient Hospital Stay (HOSPITAL_COMMUNITY)

## 2024-01-05 ENCOUNTER — Telehealth: Payer: Self-pay | Admitting: Pulmonary Disease

## 2024-01-05 DIAGNOSIS — J189 Pneumonia, unspecified organism: Secondary | ICD-10-CM | POA: Diagnosis not present

## 2024-01-05 DIAGNOSIS — J9601 Acute respiratory failure with hypoxia: Secondary | ICD-10-CM | POA: Diagnosis not present

## 2024-01-05 DIAGNOSIS — I214 Non-ST elevation (NSTEMI) myocardial infarction: Secondary | ICD-10-CM | POA: Diagnosis not present

## 2024-01-05 LAB — URINE CULTURE: Culture: >=100000 — AB

## 2024-01-05 LAB — LEGIONELLA PNEUMOPHILA SEROGP 1 UR AG: L. pneumophila Serogp 1 Ur Ag: NEGATIVE

## 2024-01-05 LAB — CBC WITH DIFFERENTIAL/PLATELET
Abs Immature Granulocytes: 0.18 10*3/uL — ABNORMAL HIGH (ref 0.00–0.07)
Basophils Absolute: 0 10*3/uL (ref 0.0–0.1)
Basophils Relative: 0 %
Eosinophils Absolute: 0 10*3/uL (ref 0.0–0.5)
Eosinophils Relative: 0 %
HCT: 39 % (ref 39.0–52.0)
Hemoglobin: 13 g/dL (ref 13.0–17.0)
Immature Granulocytes: 2 %
Lymphocytes Relative: 13 %
Lymphs Abs: 1.1 10*3/uL (ref 0.7–4.0)
MCH: 31.1 pg (ref 26.0–34.0)
MCHC: 33.3 g/dL (ref 30.0–36.0)
MCV: 93.3 fL (ref 80.0–100.0)
Monocytes Absolute: 0.6 10*3/uL (ref 0.1–1.0)
Monocytes Relative: 6 %
Neutro Abs: 7 10*3/uL (ref 1.7–7.7)
Neutrophils Relative %: 79 %
Platelets: 145 10*3/uL — ABNORMAL LOW (ref 150–400)
RBC: 4.18 MIL/uL — ABNORMAL LOW (ref 4.22–5.81)
RDW: 14.1 % (ref 11.5–15.5)
WBC: 8.9 10*3/uL (ref 4.0–10.5)
nRBC: 0 % (ref 0.0–0.2)

## 2024-01-05 LAB — BASIC METABOLIC PANEL WITH GFR
Anion gap: 9 (ref 5–15)
BUN: 30 mg/dL — ABNORMAL HIGH (ref 6–20)
CO2: 23 mmol/L (ref 22–32)
Calcium: 8.2 mg/dL — ABNORMAL LOW (ref 8.9–10.3)
Chloride: 105 mmol/L (ref 98–111)
Creatinine, Ser: 0.91 mg/dL (ref 0.61–1.24)
GFR, Estimated: >60 mL/min (ref >60)
Glucose, Bld: 147 mg/dL — ABNORMAL HIGH (ref 70–99)
Potassium: 4.1 mmol/L (ref 3.5–5.1)
Sodium: 137 mmol/L (ref 135–145)

## 2024-01-05 LAB — GLUCOSE, CAPILLARY
Glucose-Capillary: 186 mg/dL — ABNORMAL HIGH (ref 70–99)
Glucose-Capillary: 194 mg/dL — ABNORMAL HIGH (ref 70–99)
Glucose-Capillary: 162 mg/dL — ABNORMAL HIGH (ref 70–99)
Glucose-Capillary: 122 mg/dL — ABNORMAL HIGH (ref 70–99)

## 2024-01-05 MED ORDER — PREDNISONE 20 MG PO TABS: 30.0000 mg | ORAL_TABLET | Freq: Every day | ORAL | Status: DC

## 2024-01-05 MED ORDER — PREDNISONE 20 MG PO TABS: 20.0000 mg | ORAL_TABLET | Freq: Every day | ORAL | Status: DC

## 2024-01-05 MED ORDER — MUPIROCIN 2 % EX OINT
TOPICAL_OINTMENT | Freq: Two times a day (BID) | CUTANEOUS | Status: DC
  Filled 2024-01-05: qty 22

## 2024-01-05 MED ORDER — FUROSEMIDE 10 MG/ML IJ SOLN
40.0000 mg | Freq: Every day | INTRAMUSCULAR | Status: DC
  Administered 2024-01-05 – 2024-01-06 (×2): 40 mg via INTRAVENOUS
  Filled 2024-01-05 (×2): qty 4

## 2024-01-05 MED ORDER — PREDNISONE 20 MG PO TABS
40.0000 mg | ORAL_TABLET | Freq: Every day | ORAL | Status: DC
  Administered 2024-01-06 – 2024-01-07 (×2): 40 mg via ORAL
  Filled 2024-01-05 (×2): qty 2

## 2024-01-05 MED ORDER — PREDNISONE 5 MG PO TABS: 5.0000 mg | ORAL_TABLET | Freq: Every day | ORAL | Status: DC

## 2024-01-05 MED ORDER — PREDNISONE 10 MG PO TABS: 10.0000 mg | ORAL_TABLET | Freq: Every day | ORAL | Status: DC

## 2024-01-05 NOTE — Progress Notes (Signed)
 PROGRESS NOTE  Patrick Baxter FMW:[id number] DOB: 01-11-1971 DOA: 01/02/2024 PCP: Bulah Alm RAMAN, PA-C  HPI/Recap of past 24 hours: Patrick Baxter is a 53 y/o gentleman with a history of obesity, DM, HTN, OSA on CPAP who presented for worsening back pain and some shortness of breath and has been ongoing for the past few days.  Patient had a tele visit for back pain, radiating into hips, and he was prescribed prednisone , flexeril , and hydrocodone .  Patient does not smoke or vape.  Patient works at [employer] and is frequently around kids.  Due to worsening pain, EMS was activated, noted he was saturating at 68% on room air, placed on NRB and achieved saturations of 90%. In the ED, patient was placed on heated high flow nasal cannula, PCCM consulted for admission further management.  Patient was able to be weaned off to about 6 L of nasal cannula and further stabilized.  [facility] hospitalist assumed care on 01/04/2024.    Today, patient denies any new complaints.  Still requiring about 4 L of O2.    Assessment/Plan: Principal Problem:   Acute respiratory failure with hypoxia (HCC) Active Problems:   Multifocal pneumonia   Acute respiratory failure with hypoxia Diffuse bilateral airspace disease most likely multilobar pneumonia/ARDS Currently on 4 L of O2 at rest, and requiring about 6 L for ambulation RVP panel, urine strep pneumo negative, urine Legionella pending BC x 2 NGTD CTA chest, negative for PE, noted above Repeat chest x-ray with significantly decreased right upper lobe opacity most consistent with improving pneumonia Continue azithromycin , ceftriaxone , d/c vanc--> switch to p.o. linezolid S/p IV Solu-Medrol --> PO prednisone  Aspiration precautions Continue supplemental O2 and wean once able   E. coli UTI Urine culture grew greater than 100,000 and E. coli CT abdomen/pelvis, with bilateral nonobstructing renal calculi, otherwise unremarkable Continue ceftriaxone  for  now  Elevated troponin 137--100, noted flat trend Denies any chest pain, but still SOB BNP 347 EKG with no acute ST changes Echo with EF of 55 to 60%, no regional wall motion abnormality, interventricular septum is flattened in systole consistent with right ventricular pressure overload Cardiology consulted, appreciate recs, no further workup required Telemetry   DM type 2 Last A1c on 01/02/2024 was 6.3 CBGs uncontrolled likely 2/2 steroid use Continue SSI every 4 hours, Semglee, Accu-Cheks, hypoglycemic protocol Hold home Semaglutide    Hypertension BP stable Start PTA Toprol  50 mg daily, continue to hold lisinopril  20 mg daily  Hypothyroidism Continue Synthroid    Back pain Pain management PT/OT   Gout Continue Allopurinol     Allergic rhinitis Continue Singular    Rash under pannus Present on admission Continue nystatin  powder    Severe OSA Continue with inpatient CPAP with oxygen   Morbid obesity Lifestyle modification advised     Estimated body mass index is 62.75 kg/m as calculated from the following:   Height as of this encounter: 5' 8.5 (1.74 m).   Weight as of this encounter: 190 kg.     Code Status: Full  Family Communication: None at bedside  Disposition Plan: Status is: Inpatient Remains inpatient appropriate because: Level of care      Consultants: PCCM Cardiology  Procedures: None  Antimicrobials: Azithromycin , ceftriaxone , linezolid  DVT prophylaxis: Heparin  Cheval   Objective: Vitals:   01/05/24 0409 01/05/24 0725 01/05/24 1115 01/05/24 1520  BP: 118/68 123/68 110/64 130/78  Pulse: 68 72 66 72  Resp: 19 14 19 17   Temp: 98.6 F (37 C) 97.8 F (36.6 C) 98.1 F (  36.7 C) 97.9 F (36.6 C)  TempSrc: Oral Oral Oral Oral  SpO2: 95% 97% 97% 98%  Weight:      Height:        Intake/Output Summary (Last 24 hours) at 01/05/2024 1917 Last data filed at 01/05/2024 1829 Gross per 24 hour  Intake 850.07 ml  Output 2200 ml  Net  -1349.93 ml   Filed Weights   01/02/24 1907 01/04/24 0500 01/05/24 0058  Weight: (!) 190.6 kg (!) 190.3 kg (!) 190 kg    Exam: General: NAD, morbidly obese Cardiovascular: S1, S2 present Respiratory: Diminished breath sounds bilaterally Abdomen: Soft, nontender, nondistended, bowel sounds present Musculoskeletal: bilateral pedal edema noted Skin: Normal Psychiatry: Normal mood     Data Reviewed: CBC: Recent Labs  Lab 01/02/24 1355 01/02/24 1545 01/02/24 1705 01/03/24 0307 01/04/24 0435 01/05/24 0223  WBC 12.9*  --   --  9.0 9.1 8.9  NEUTROABS 11.3*  --   --   --   --  7.0  HGB 14.2 12.2* 13.6 13.6 12.9* 13.0  HCT 41.4 36.0* 40.0 40.1 39.0 39.0  MCV 91.6  --   --  93.9 94.4 93.3  PLT 125*  --   --  112* 125* 145*   Basic Metabolic Panel: Recent Labs  Lab 01/02/24 1355 01/02/24 1545 01/02/24 1705 01/03/24 0307 01/04/24 0435 01/05/24 0223  NA 137 135 137 136 137 137  K 4.1 3.7 4.1 4.2 4.3 4.1  CL 104  --   --  108 108 105  CO2 19*  --   --  20* 21* 23  GLUCOSE 198*  --   --  259* 221* 147*  BUN 21*  --   --  25* 32* 30*  CREATININE 1.09  --   --  0.91 0.98 0.91  CALCIUM  8.3*  --   --  8.3* 8.2* 8.2*  MG  --   --   --  2.5*  --   --   PHOS  --   --   --  3.1  --   --    GFR: Estimated Creatinine Clearance: 154.6 mL/min (by C-G formula based on SCr of 0.91 mg/dL). Liver Function Tests: Recent Labs  Lab 01/02/24 1355  AST 40  ALT 35  ALKPHOS 68  BILITOT 0.9  PROT 6.6  ALBUMIN 2.5*   No results for input(s): LIPASE, AMYLASE in the last 168 hours. No results for input(s): AMMONIA in the last 168 hours. Coagulation Profile: Recent Labs  Lab 01/02/24 1959  INR 1.3*   Cardiac Enzymes: No results for input(s): CKTOTAL, CKMB, CKMBINDEX, TROPONINI in the last 168 hours. BNP (last 3 results) No results for input(s): PROBNP in the last 8760 hours. HbA1C: Recent Labs    01/02/24 1959  HGBA1C 6.3*   CBG: Recent Labs  Lab  01/04/24 1931 01/04/24 2327 01/05/24 0409 01/05/24 1117 01/05/24 1521  GLUCAP 224* 127* 186* 194* 162*   Lipid Profile: No results for input(s): CHOL, HDL, LDLCALC, TRIG, CHOLHDL, LDLDIRECT in the last 72 hours. Thyroid Function Tests: No results for input(s): TSH, T4TOTAL, FREET4, T3FREE, THYROIDAB in the last 72 hours. Anemia Panel: No results for input(s): VITAMINB12, FOLATE, FERRITIN, TIBC, IRON, RETICCTPCT in the last 72 hours. Urine analysis:    Component Value Date/Time   COLORURINE AMBER (A) 01/02/2024 1405   APPEARANCEUR CLOUDY (A) 01/02/2024 1405   APPEARANCEUR CANCELED 12/11/2023 1004   LABSPEC 1.025 01/02/2024 1405   LABSPEC 1.015 12/11/2023 0958   PHURINE 5.0 01/02/2024  1405   GLUCOSEU NEGATIVE 01/02/2024 1405   HGBUR LARGE (A) 01/02/2024 1405   BILIRUBINUR NEGATIVE 01/02/2024 1405   BILIRUBINUR CANCELED 12/11/2023 1004   BILIRUBINUR negative 12/11/2023 0958   KETONESUR NEGATIVE 01/02/2024 1405   PROTEINUR 100 (A) 01/02/2024 1405   UROBILINOGEN negative (A) 10/28/2016 1044   NITRITE NEGATIVE 01/02/2024 1405   LEUKOCYTESUR LARGE (A) 01/02/2024 1405   Sepsis Labs: @LABRCNTIP (procalcitonin:4,lacticidven:4)  ) Recent Results (from the past 240 hours)  Resp panel by RT-PCR (RSV, Flu A&B, Covid) Anterior Nasal Swab     Status: None   Collection Time: 01/02/24  1:55 PM   Specimen: Anterior Nasal Swab  Result Value Ref Range Status   SARS Coronavirus 2 by RT PCR NEGATIVE NEGATIVE Final   Influenza A by PCR NEGATIVE NEGATIVE Final   Influenza B by PCR NEGATIVE NEGATIVE Final    Comment: (NOTE) The Xpert Xpress SARS-CoV-2/FLU/RSV plus assay is intended as an aid in the diagnosis of influenza from Nasopharyngeal swab specimens and should not be used as a sole basis for treatment. Nasal washings and aspirates are unacceptable for Xpert Xpress SARS-CoV-2/FLU/RSV testing.  Fact Sheet for  Patients: BloggerCourse.com  Fact Sheet for Healthcare Providers: SeriousBroker.it  This test is not yet approved or cleared by the [place]  FDA and has been authorized for detection and/or diagnosis of SARS-CoV-2 by FDA under an Emergency Use Authorization (EUA). This EUA will remain in effect (meaning this test can be used) for the duration of the COVID-19 declaration under Section 564(b)(1) of the Act, 21 U.S.C. section 360bbb-3(b)(1), unless the authorization is terminated or revoked.     Resp Syncytial Virus by PCR NEGATIVE NEGATIVE Final    Comment: (NOTE) Fact Sheet for Patients: BloggerCourse.com  Fact Sheet for Healthcare Providers: SeriousBroker.it  This test is not yet approved or cleared by the [place]  FDA and has been authorized for detection and/or diagnosis of SARS-CoV-2 by FDA under an Emergency Use Authorization (EUA). This EUA will remain in effect (meaning this test can be used) for the duration of the COVID-19 declaration under Section 564(b)(1) of the Act, 21 U.S.C. section 360bbb-3(b)(1), unless the authorization is terminated or revoked.  Performed at [facility], [address], [place], [place] [address]   Culture, blood (Routine x 2)     Status: None (Preliminary result)   Collection Time: 01/02/24  1:57 PM   Specimen: BLOOD RIGHT HAND  Result Value Ref Range Status   Specimen Description BLOOD RIGHT HAND  Final   Special Requests   Final    BOTTLES DRAWN AEROBIC AND ANAEROBIC Blood Culture adequate volume   Culture   Final    NO GROWTH 3 DAYS Performed at [facility], [address] [place], [place] [address]    Report Status PENDING  Incomplete  Culture, blood (Routine x 2)     Status: None (Preliminary result)   Collection Time: 01/02/24  2:02 PM   Specimen: BLOOD RIGHT HAND  Result Value Ref Range Status   Specimen  Description BLOOD RIGHT HAND  Final   Special Requests   Final    BOTTLES DRAWN AEROBIC AND ANAEROBIC Blood Culture results may not be optimal due to an inadequate volume of blood received in culture bottles   Culture   Final    NO GROWTH 3 DAYS Performed at [facility], [address], [place], [place] [address]    Report Status PENDING  Incomplete  Urine Culture  Status: Abnormal   Collection Time: 01/02/24  2:05 PM   Specimen: Urine, Random  Result Value Ref Range Status   Specimen Description URINE, RANDOM  Final   Special Requests NONE Reflexed from [id number]  Final   Culture (A)  Final    >=100,000 COLONIES/mL ESCHERICHIA COLI Two isolates with different morphologies were identified as the same organism.The most resistant organism was reported. Performed at [facility], [address], [place], [place] [address]    Report Status 01/05/2024 FINAL  Final   Organism ID, Bacteria ESCHERICHIA COLI (A)  Final      Susceptibility   Escherichia coli - MIC*    AMPICILLIN >=32 RESISTANT Resistant     CEFAZOLIN <=4 SENSITIVE Sensitive     CEFEPIME <=0.12 SENSITIVE Sensitive     CEFTRIAXONE  <=0.25 SENSITIVE Sensitive     CIPROFLOXACIN  >=4 RESISTANT Resistant     GENTAMICIN <=1 SENSITIVE Sensitive     IMIPENEM <=0.25 SENSITIVE Sensitive     NITROFURANTOIN <=16 SENSITIVE Sensitive     TRIMETH/SULFA >=320 RESISTANT Resistant     AMPICILLIN/SULBACTAM 16 INTERMEDIATE Intermediate     PIP/TAZO <=4 SENSITIVE Sensitive ug/mL    * >=100,000 COLONIES/mL ESCHERICHIA COLI  Respiratory (~20 pathogens) panel by PCR     Status: None   Collection Time: 01/02/24  5:30 PM   Specimen: Nasopharyngeal Swab; Respiratory  Result Value Ref Range Status   Adenovirus NOT DETECTED NOT DETECTED Final   Coronavirus 229E NOT DETECTED NOT DETECTED Final    Comment: (NOTE) The Coronavirus on the Respiratory Panel, DOES NOT test for the novel  Coronavirus (2019 nCoV)    Coronavirus HKU1 NOT  DETECTED NOT DETECTED Final   Coronavirus NL63 NOT DETECTED NOT DETECTED Final   Coronavirus OC43 NOT DETECTED NOT DETECTED Final   Metapneumovirus NOT DETECTED NOT DETECTED Final   Rhinovirus / Enterovirus NOT DETECTED NOT DETECTED Final   Influenza A NOT DETECTED NOT DETECTED Final   Influenza B NOT DETECTED NOT DETECTED Final   Parainfluenza Virus 1 NOT DETECTED NOT DETECTED Final   Parainfluenza Virus 2 NOT DETECTED NOT DETECTED Final   Parainfluenza Virus 3 NOT DETECTED NOT DETECTED Final   Parainfluenza Virus 4 NOT DETECTED NOT DETECTED Final   Respiratory Syncytial Virus NOT DETECTED NOT DETECTED Final   Bordetella pertussis NOT DETECTED NOT DETECTED Final   Bordetella Parapertussis NOT DETECTED NOT DETECTED Final   Chlamydophila pneumoniae NOT DETECTED NOT DETECTED Final   Mycoplasma pneumoniae NOT DETECTED NOT DETECTED Final    Comment: Performed at [facility], [address], [place], [place] [address]  SARS Coronavirus 2 by RT PCR (hospital order, performed in [facility] hospital lab) *cepheid single result test* Anterior Nasal Swab     Status: None   Collection Time: 01/02/24  5:30 PM   Specimen: Anterior Nasal Swab  Result Value Ref Range Status   SARS Coronavirus 2 by RT PCR NEGATIVE NEGATIVE Final    Comment: Performed at [facility], [address] [place], [place] [address]  MRSA Next Gen by PCR, Nasal     Status: Abnormal   Collection Time: 01/02/24  5:30 PM   Specimen: Nasal Mucosa; Nasal Swab  Result Value Ref Range Status   MRSA by PCR Next Gen DETECTED (A) NOT DETECTED Final    Comment: RESULT CALLED TO, READ BACK BY AND VERIFIED WITH: RN VINA DEL [phone] FCP (NOTE) The GeneXpert MRSA Assay (FDA approved for NASAL specimens only), is  one component of a comprehensive MRSA colonization surveillance program. It is not intended to diagnose MRSA infection nor to guide or monitor treatment for MRSA infections. Test performance is not FDA approved  in patients less than 36 years old. Performed at [facility], [address] [place], [place] [address]   Expectorated Sputum Assessment w Gram Stain, Rflx to Resp Cult     Status: None   Collection Time: 01/02/24  6:21 PM   Specimen: Sputum  Result Value Ref Range Status   Specimen Description SPUTUM  Final   Special Requests NONE  Final   Sputum evaluation   Final    Sputum specimen not acceptable for testing.  Please recollect.   NOTIFIED RN VELMA MANILA ON 01/04/24 @ 1546 BY DRT Performed at [facility], [address] [place], [place] [address]    Report Status 01/04/2024 FINAL  Final      Studies: DG CHEST PORT 1 VIEW Result Date: 01/05/2024 CLINICAL DATA:  Dyspnea. EXAM: PORTABLE CHEST 1 VIEW COMPARISON:  January 02, 2024. FINDINGS: Stable cardiomediastinal silhouette. Left lung is clear. Right upper lobe opacity noted on prior exam is significantly smaller suggesting improving pneumonia. Bony thorax is unremarkable. IMPRESSION: Significantly decreased right upper lobe opacity most consistent with improving pneumonia. Continued radiographic follow-up is recommended to ensure resolution. Electronically Signed   By: Lynwood Landy Raddle M.D.   On: 01/05/2024 12:17    Scheduled Meds:  allopurinol   300 mg Oral Daily   Chlorhexidine Gluconate Cloth  6 each Topical Daily   FLUoxetine   40 mg Oral Daily   furosemide  40 mg Intravenous Daily   heparin   5,000 Units Subcutaneous Q8H   insulin  aspart  0-20 Units Subcutaneous Q4H   insulin  glargine-yfgn  10 Units Subcutaneous Daily   levothyroxine   25 mcg Oral Q0600   lidocaine   2 patch Transdermal Q24H   linezolid  600 mg Oral Q12H   loratadine   10 mg Oral Daily   metoprolol  succinate  50 mg Oral Daily   montelukast   10 mg Oral QHS   mupirocin ointment   Nasal BID   nystatin    Topical BID   [START ON 01/06/2024] predniSONE   40 mg Oral Q breakfast   Followed by   NOREEN ON 01/09/2024] predniSONE   30 mg Oral Q breakfast    Followed by   NOREEN ON 01/12/2024] predniSONE   20 mg Oral Q breakfast   Followed by   NOREEN ON 01/15/2024] predniSONE   10 mg Oral Q breakfast   Followed by   NOREEN ON 01/18/2024] predniSONE   5 mg Oral Q breakfast    Continuous Infusions:  azithromycin  500 mg (01/05/24 1644)   cefTRIAXone  (ROCEPHIN )  IV 2 g (01/05/24 1136)     LOS: 3 days     Lebron JINNY Cage, MD [facility]  If 7PM-7AM, please contact night-coverage [email or web address] 01/05/2024, 7:17 PM

## 2024-01-05 NOTE — Progress Notes (Signed)
   NAME:  Patrick Baxter, MRN:  [id number], DOB:  1971/04/13, LOS: 3 ADMISSION DATE:  01/02/2024, CONSULTATION DATE:  6/21 REFERRING MD:  Lang- EDPA, CHIEF COMPLAINT:  respiratory failure   History of Present Illness:  Patrick Baxter is a 53 y/o gentleman with a history of obesity, DM, HTN, OSA on CPAP who presented for back pain that was getting worse for the past few days. He noticed mild SOB and cough that began last night, and his cough made his back pain more severe. Yesterday he had an UC tele visit for back pain, radiating into hips, and he was prescribed prednisone , flexeril , and hydrocodone .  When EMS arrived he was saturating 68% when EMS arrived. He was placed on NRB and achieved saturations of 90%. In the ED he had blood cultures drawn (second set after antibiotics) , was started on ceftriaxone  and azithromycin , and was placed on HHFNC. PCCM was called for evaluation and management.   He does not smoke or vape. No new environmental exposures that he is aware of. He is not on immune suppressing meds; no personal history of autoimmune disease. He has not family history of lung disease. He works at [employer] at [employer] and is around children frequently, but no specific sick contacts that he is aware of. No tick bites. No recent URI or GI symptoms.   Pertinent  Medical History  OSA DM HTN HLD obesity  Significant Hospital Events: Including procedures, antibiotic start and stop dates in addition to other pertinent events   6/21 admitted with acute hypoxic respiratory failure, CT concerning for ARDS.  Placed on [facility] admitted to ICU 6/22 no acute issues overnight oxygen weaned to 7 L nasal cannula  Interim History / Subjective:  Complains of CPAP facemask not putting well  Objective    Blood pressure 123/68, pulse 72, temperature 97.8 F (36.6 C), temperature source Oral, resp. rate 14, height 5' 8.5 (1.74 m), weight (!) 190 kg, SpO2 97%.    FiO2 (%):  [21 %-50 %] 21 % PEEP:   [5 cmH20-10 cmH20] 10 cmH20 Pressure Support:  [3 cmH20] 3 cmH20   Intake/Output Summary (Last 24 hours) at 01/05/2024 1045 Last data filed at 01/05/2024 0800 Gross per 24 hour  Intake 470.07 ml  Output 400 ml  Net 70.07 ml   Filed Weights   01/02/24 1907 01/04/24 0500 01/05/24 0058  Weight: (!) 190.6 kg (!) 190.3 kg (!) 190 kg    Examination: Obese male well-nourished in no acute No JVD right lung has a precarinal Decreased breath sounds bilateral Heart sounds are regular Weanable obese soft nontender positive bowel sounds all No mass edema  Resolved problem list   Assessment and Plan  Acute respiratory failure with hypoxia- diffuse bilateral airspace disease most likely multilobar pneumonia. -RVP panel negative, strep pneumoniae urine ag negative P: Continue oxygen as needed Is having difficulty finding a fullface mask that is comfortable. Follow-up labs Continue antimicrobial therapy Aspiration point   Severe OSA, AHI 103.9 P: Continue with inpatient CPAP with bleed in oxygen    Rest per Primary Team UTI DM, hyperglycemia from steroids Back pain out Allergic rhinitis Rash under pannus H/o HTN  PCCM will continue to follow, he will need outpatient follow up in clinic  Marcey Kelbi Renstrom ACNP Acute Care Nurse Practitioner [facility] Please consult Amion 01/05/2024, 10:45 AM

## 2024-01-05 NOTE — Progress Notes (Signed)
   01/05/24 0140  BiPAP/CPAP/SIPAP  $ Non-Invasive Home Ventilator  Initial  $ Face Mask Medium Yes  BiPAP/CPAP/SIPAP Pt Type Adult  BiPAP/CPAP/SIPAP Resmed  Mask Type Full face mask  Dentures removed? Not applicable  Mask Size Medium  Respiratory Rate 20 breaths/min  PEEP 10 cmH20  FiO2 (%) 21 %  Patient Home Machine No  Patient Home Mask No  Patient Home Tubing No  Auto Titrate No  CPAP/SIPAP surface wiped down Yes  Device Plugged into RED Power Outlet Yes  BiPAP/CPAP /SiPAP Vitals  Pulse Rate 76  Resp 20  SpO2 96 %  Bilateral Breath Sounds Diminished  MEWS Score/Color  MEWS Score 0  MEWS Score Color Landy

## 2024-01-05 NOTE — Consult Note (Addendum)
 Cardiology Consultation   Patient ID: Patrick Baxter MRN: [id number]; DOB: [birth year]  Admit date: 01/02/2024 Date of Consult: 01/05/2024  PCP:  Bulah Alm RAMAN, PA-C   [facility] Providers Cardiologist:  Madonna Large, DO     Patient Profile: Patrick Baxter is a 53 y.o. male with a hx of obesity, diabetes, hypertension, OSA on CPAP who is being seen 01/05/2024 for the evaluation of elevated troponin, abnormal echo at the request of Dr. Donnamarie.  History of Present Illness: Patrick Baxter is a 53 year old male with above medical history.  Per chart review, patient previously followed with Dr. Large but has not been seen since 2022. He previously had an echocardiogram in 08/2019 that showed EF 60-65%, normal global wall motion, normal diastolic filling pattern, moderate-severe LVH, mild TR.  Nuclear stress test 11/2019 showed a small size inferior fixed defect suggestive of small inferior scar, no evidence of ischemia.  Overall low risk scan.  Patient initially had a virtual visit with his PCP on 6/20.  Reported acute exacerbation of back pain radiating to his hips bilaterally.  He was started on Flexeril  and prednisone  but did not have improvement in symptoms.  He presented to the ED 6/21.  Initially he had called out for back pain.  Also reported cough and subjective fever.  He was hypoxic and was placed on nonrebreather.  CTA chest in the ED showed no evidence of PE, dense right upper lobe consolidation, multifocal severe airspace disease within the left and right lungs involving upper and lower lobes.  Concerning for multifocal global pneumonia versus atypical pneumonia versus ARDS.  He was admitted to the critical care service for treatment of acute respiratory failure with hypoxia. Started on broad spectrum antibiotics. By 6/23, he was able to be weaned down to 6 L via [place]. Was transferred to the internal medicine service.   Due to elevated troponin (hsTn 137>100) and dyspnea, an  echocardiogram was ordered. Echocardiogram 6/23 showed EF 55-60%, no regional wall motion abnormalities, normal RV systolic function, no significant valvular abnormalities.  There was interventricular septal flattening in systole consistent with RV pressure overload.  Cardiology consulted  On interview, patient reports feeling better. His breathing has significantly improved and almost feels back to normal. He has been able to walk around his room a bit.  Does get a bit short of breath with exertion but attributes this to lack of walking around recently as well as his pneumonia.  He denies any chest pain.  He has a history of sleep apnea and is followed by pulmonology for this.  He works in [employer] and is usually able to go into work without difficulty.  He has had to have some issues with back and knee pain that limits his activities.  He also has an issue with recurrent UTIs.  Followed by urology   Past Medical History:  Diagnosis Date   Allergy    Anxiety    Back pain    Bilateral chronic knee pain    Chronic headaches    Depression    Dermatographism    Dry skin    Fatigue    Frequent urination    Gout    Hx of echocardiogram 09/07/2019   normal LV function, EF 60-65%, no wall motion abnormality; Dr. Large Madonna   Hyperlipidemia    Hypertension    Joint pain    Knee pain    Obesity    Pre-diabetes    Sleep apnea    Stress  Wears glasses     Past Surgical History:  Procedure Laterality Date   IR INJECT/THERA/INC NEEDLE/CATH/PLC EPI/LUMB/SAC W/IMG  09/14/2020   IR INJECT/THERA/INC NEEDLE/CATH/PLC EPI/LUMB/SAC W/IMG  09/14/2020   KNEE SURGERY  11/2006   right knee arthroscopy and meniscal repair, Dr. Rendell      Scheduled Meds:  allopurinol   300 mg Oral Daily   Chlorhexidine Gluconate Cloth  6 each Topical Daily   FLUoxetine   40 mg Oral Daily   furosemide  40 mg Intravenous Daily   heparin   5,000 Units Subcutaneous Q8H   insulin  aspart  0-20 Units Subcutaneous Q4H    insulin  glargine-yfgn  10 Units Subcutaneous Daily   levothyroxine   25 mcg Oral Q0600   lidocaine   2 patch Transdermal Q24H   linezolid  600 mg Oral Q12H   loratadine   10 mg Oral Daily   metoprolol  succinate  50 mg Oral Daily   montelukast   10 mg Oral QHS   mupirocin ointment   Nasal BID   nystatin    Topical BID   [START ON 01/06/2024] predniSONE   40 mg Oral Q breakfast   Followed by   NOREEN ON 01/09/2024] predniSONE   30 mg Oral Q breakfast   Followed by   NOREEN ON 01/12/2024] predniSONE   20 mg Oral Q breakfast   Followed by   NOREEN ON 01/15/2024] predniSONE   10 mg Oral Q breakfast   Followed by   NOREEN ON 01/18/2024] predniSONE   5 mg Oral Q breakfast   Continuous Infusions:  azithromycin  Stopped (01/04/24 1657)   cefTRIAXone  (ROCEPHIN )  IV 2 g (01/05/24 1136)   PRN Meds: docusate sodium, polyethylene glycol, sodium chloride   Allergies:    Allergies  Allergen Reactions   Atorvastatin  Other (See Comments)    myalgia   Pravastatin  Sodium Other (See Comments)    myalgia    Social History:   Social History   Socioeconomic History   Marital status: Single    Spouse name: Not on file   Number of children: Not on file   Years of education: Not on file   Highest education level: Bachelor's degree (e.g., BA, AB, BS)  Occupational History   Occupation: librarian  Tobacco Use   Smoking status: Former    Types: Cigarettes   Smokeless tobacco: Never   Tobacco comments:    socially prior, not daily smoker  Vaping Use   Vaping status: Never Used  Substance and Sexual Activity   Alcohol use: Yes    Alcohol/week: 3.0 standard drinks of alcohol    Types: 3 Cans of beer per week   Drug use: No   Sexual activity: Not on file  Other Topics Concern   Not on file  Social History Narrative   Single, works for [employer] [facility]).   No significant other.     Social Drivers of Corporate investment banker Strain: Low Risk  (01/01/2024)   Overall Financial Resource Strain  (CARDIA)    Difficulty of Paying Living Expenses: Not hard at all  Food Insecurity: No Food Insecurity (01/05/2024)   Hunger Vital Sign    Worried About Running Out of Food in the Last Year: Never true    Ran Out of Food in the Last Year: Never true  Transportation Needs: No Transportation Needs (01/05/2024)   PRAPARE - Administrator, Civil Service (Medical): No    Lack of Transportation (Non-Medical): No  Physical Activity: Insufficiently Active (01/01/2024)   Exercise Vital Sign    Days of  Exercise per Week: 2 days    Minutes of Exercise per Session: 30 min  Stress: Stress Concern Present (01/01/2024)   [employer] of Occupational Health - Occupational Stress Questionnaire    Feeling of Stress: To some extent  Social Connections: Socially Isolated (01/01/2024)   Social Connection and Isolation Panel    Frequency of Communication with Friends and Family: More than three times a week    Frequency of Social Gatherings with Friends and Family: Once a week    Attends Religious Services: Never    Database administrator or Organizations: No    Attends Engineer, structural: Not on file    Marital Status: Never married  Intimate Partner Violence: Patient Declined (01/05/2024)   Humiliation, Afraid, Rape, and Kick questionnaire    Fear of Current or Ex-Partner: Patient declined    Emotionally Abused: Patient declined    Physically Abused: Patient declined    Sexually Abused: Patient declined    Family History:    Family History  Problem Relation Age of Onset   Alzheimer's disease Mother    Heart disease Mother        62, angioplasty, CAD   Cancer Mother        breast   Stroke Mother    Hypertension Mother    Hyperlipidemia Mother    Heart disease Father        angina   Arthritis Father    Cancer Maternal Grandmother        leukemia   Diabetes Paternal Grandmother      ROS:  Please see the history of present illness.   All other ROS reviewed and  negative.     Physical Exam/Data: Vitals:   01/05/24 0409 01/05/24 0725 01/05/24 1115 01/05/24 1520  BP: 118/68 123/68 110/64 130/78  Pulse: 68 72 66 72  Resp: 19 14 19 17   Temp: 98.6 F (37 C) 97.8 F (36.6 C) 98.1 F (36.7 C) 97.9 F (36.6 C)  TempSrc: Oral Oral Oral Oral  SpO2: 95% 97% 97% 98%  Weight:      Height:        Intake/Output Summary (Last 24 hours) at 01/05/2024 1624 Last data filed at 01/05/2024 1338 Gross per 24 hour  Intake 610.07 ml  Output 1900 ml  Net -1289.93 ml      01/05/2024   12:58 AM 01/04/2024    5:00 AM 01/02/2024    7:07 PM  Last 3 Weights  Weight (lbs) 418 lb 12.8 oz 419 lb 8.6 oz 420 lb 3.2 oz  Weight (kg) 189.966 kg 190.3 kg 190.6 kg     Body mass index is 62.75 kg/m.  General: Middle-aged male.  Laying flat in the bed with head elevated.  No acute distress HEENT: normal Neck: no JVD Vascular: Radial pulses 2+ bilaterally Cardiac:  normal S1, S2; RRR; no murmur  Lungs:  clear to auscultation bilaterally, no wheezing, rhonchi or rales.  Normal work of breathing on nasal cannula Abd: soft, nontender  Ext: no edema in bilateral lower extremities Musculoskeletal:  No deformities Skin: warm and dry  Neuro:  CNs 2-12 intact, no focal abnormalities noted Psych:  Normal affect   EKG:  The EKG was personally reviewed and demonstrates:  NSR with RBBB, HR 92 BPM  Telemetry:  Telemetry was personally reviewed and demonstrates: Normal sinus rhythm  Relevant CV Studies: Cardiac Studies & Procedures   ______________________________________________________________________________________________   STRESS TESTS  MYOCARDIAL PERFUSION IMAGING 11/14/2019   ECHOCARDIOGRAM  ECHOCARDIOGRAM COMPLETE 01/04/2024  Narrative ECHOCARDIOGRAM REPORT    Patient Name:   LATIF NAZARENO Date of Exam: 01/04/2024 Medical Rec #:  [id number]     Height:       68.5 in Accession #:    [id number]    Weight:       419.5 lb Date of Birth:  1971/04/13    BSA:           2.815 m Patient Age:    52 years      BP:           87/64 mmHg Patient Gender: M             HR:           86 bpm. Exam Location:  Inpatient  Procedure: 2D Echo, Cardiac Doppler, Color Doppler and Intracardiac Opacification Agent (Both Spectral and Color Flow Doppler were utilized during procedure).  Indications:    Dyspnea  History:        Patient has prior history of Echocardiogram examinations, most recent 09/29/2017. Risk Factors:Hypertension, Diabetes, Sleep Apnea and Former Smoker.  Sonographer:    Koleen Popper RDCS Referring Phys: [id number] LEBRON PARAS [facility]   Sonographer Comments: Patient is obese. IMPRESSIONS   1. Left ventricular ejection fraction, by estimation, is 55 to 60%. The left ventricle has normal function. The left ventricle has no regional wall motion abnormalities. Left ventricular diastolic parameters were normal. There is the interventricular septum is flattened in systole, consistent with right ventricular pressure overload. 2. Right ventricular systolic function is normal. The right ventricular size is normal. Tricuspid regurgitation signal is inadequate for assessing PA pressure. 3. The mitral valve is normal in structure. No evidence of mitral valve regurgitation. No evidence of mitral stenosis. 4. The aortic valve is tricuspid. Aortic valve regurgitation is not visualized. Aortic valve sclerosis is present, with no evidence of aortic valve stenosis. 5. The inferior vena cava is normal in size with greater than 50% respiratory variability, suggesting right atrial pressure of 3 mmHg.  FINDINGS Left Ventricle: Left ventricular ejection fraction, by estimation, is 55 to 60%. The left ventricle has normal function. The left ventricle has no regional wall motion abnormalities. Definity contrast agent was given IV to delineate the left ventricular endocardial borders. The left ventricular internal cavity size was normal in size. There is no left ventricular  hypertrophy. The interventricular septum is flattened in systole, consistent with right ventricular pressure overload. Left ventricular diastolic parameters were normal.  Right Ventricle: The right ventricular size is normal. No increase in right ventricular wall thickness. Right ventricular systolic function is normal. Tricuspid regurgitation signal is inadequate for assessing PA pressure.  Left Atrium: Left atrial size was normal in size.  Right Atrium: Right atrial size was normal in size.  Pericardium: There is no evidence of pericardial effusion.  Mitral Valve: The mitral valve is normal in structure. No evidence of mitral valve regurgitation. No evidence of mitral valve stenosis.  Tricuspid Valve: The tricuspid valve is normal in structure. Tricuspid valve regurgitation is not demonstrated. No evidence of tricuspid stenosis.  Aortic Valve: The aortic valve is tricuspid. Aortic valve regurgitation is not visualized. Aortic valve sclerosis is present, with no evidence of aortic valve stenosis.  Pulmonic Valve: The pulmonic valve was normal in structure. Pulmonic valve regurgitation is not visualized. No evidence of pulmonic stenosis.  Aorta: The aortic root is normal in size and structure.  Venous: The inferior vena cava is normal in size with greater than 50%  respiratory variability, suggesting right atrial pressure of 3 mmHg.  IAS/Shunts: No atrial level shunt detected by color flow Doppler.   LEFT VENTRICLE PLAX 2D LVIDd:         5.00 cm   Diastology LVIDs:         3.20 cm   LV e' medial:    6.96 cm/s LV PW:         1.20 cm   LV E/e' medial:  8.6 LV IVS:        1.40 cm   LV e' lateral:   11.30 cm/s LVOT diam:     2.30 cm   LV E/e' lateral: 5.3 LV SV:         109 LV SV Index:   39 LVOT Area:     4.15 cm   RIGHT VENTRICLE             IVC RV S prime:     17.00 cm/s  IVC diam: 2.00 cm TAPSE (M-mode): 2.6 cm  LEFT ATRIUM             Index LA diam:        4.70 cm 1.67  cm/m LA Vol (A2C):   58.5 ml 20.78 ml/m LA Vol (A4C):   69.6 ml 24.72 ml/m LA Biplane Vol: 66.7 ml 23.69 ml/m AORTIC VALVE LVOT Vmax:   142.00 cm/s LVOT Vmean:  98.500 cm/s LVOT VTI:    0.262 m  AORTA Ao Root diam: 3.40 cm Ao Asc diam:  3.10 cm  MITRAL VALVE MV Area (PHT): 4.49 cm    SHUNTS MV Decel Time: 169 msec    Systemic VTI:  0.26 m MV E velocity: 60.20 cm/s  Systemic Diam: 2.30 cm MV A velocity: 81.60 cm/s MV E/A ratio:  0.74  Morene Brownie Electronically signed by Morene Brownie Signature Date/Time: 01/04/2024/3:33:57 PM    Final          ______________________________________________________________________________________________       Laboratory Data: High Sensitivity Troponin:   Recent Labs  Lab 01/02/24 1409 01/02/24 1959  TROPONINIHS 137* 100*     Chemistry Recent Labs  Lab 01/03/24 0307 01/04/24 0435 01/05/24 0223  NA 136 137 137  K 4.2 4.3 4.1  CL 108 108 105  CO2 20* 21* 23  GLUCOSE 259* 221* 147*  BUN 25* 32* 30*  CREATININE 0.91 0.98 0.91  CALCIUM  8.3* 8.2* 8.2*  MG 2.5*  --   --   GFRNONAA >60 >60 >60  ANIONGAP 8 8 9     Recent Labs  Lab 01/02/24 1355  PROT 6.6  ALBUMIN 2.5*  AST 40  ALT 35  ALKPHOS 68  BILITOT 0.9   Lipids No results for input(s): CHOL, TRIG, HDL, LABVLDL, LDLCALC, CHOLHDL in the last 168 hours.  Hematology Recent Labs  Lab 01/03/24 0307 01/04/24 0435 01/05/24 0223  WBC 9.0 9.1 8.9  RBC 4.27 4.13* 4.18*  HGB 13.6 12.9* 13.0  HCT 40.1 39.0 39.0  MCV 93.9 94.4 93.3  MCH 31.9 31.2 31.1  MCHC 33.9 33.1 33.3  RDW 14.1 14.0 14.1  PLT 112* 125* 145*   Thyroid No results for input(s): TSH, FREET4 in the last 168 hours.  BNP Recent Labs  Lab 01/02/24 1959  BNP 347.3*    DDimer No results for input(s): DDIMER in the last 168 hours.  Radiology/Studies:  DG CHEST PORT 1 VIEW Result Date: 01/05/2024 CLINICAL DATA:  Dyspnea. EXAM: PORTABLE CHEST 1 VIEW COMPARISON:  January 02, 2024. FINDINGS:  Stable cardiomediastinal silhouette. Left lung is clear. Right upper lobe opacity noted on prior exam is significantly smaller suggesting improving pneumonia. Bony thorax is unremarkable. IMPRESSION: Significantly decreased right upper lobe opacity most consistent with improving pneumonia. Continued radiographic follow-up is recommended to ensure resolution. Electronically Signed   By: Lynwood Landy Raddle M.D.   On: 01/05/2024 12:17   ECHOCARDIOGRAM COMPLETE Result Date: 01/04/2024    ECHOCARDIOGRAM REPORT   Patient Name:   NAFTULI DALSANTO Date of Exam: 01/04/2024 Medical Rec #:  [id number]     Height:       68.5 in Accession #:    [id number]    Weight:       419.5 lb Date of Birth:  1970/10/21    BSA:          2.815 m Patient Age:    52 years      BP:           87/64 mmHg Patient Gender: M             HR:           86 bpm. Exam Location:  Inpatient Procedure: 2D Echo, Cardiac Doppler, Color Doppler and Intracardiac            Opacification Agent (Both Spectral and Color Flow Doppler were            utilized during procedure). Indications:    Dyspnea  History:        Patient has prior history of Echocardiogram examinations, most                 recent 09/29/2017. Risk Factors:Hypertension, Diabetes, Sleep                 Apnea and Former Smoker.  Sonographer:    Koleen Popper RDCS Referring Phys: [id number] LEBRON PARAS [facility]  Sonographer Comments: Patient is obese. IMPRESSIONS  1. Left ventricular ejection fraction, by estimation, is 55 to 60%. The left ventricle has normal function. The left ventricle has no regional wall motion abnormalities. Left ventricular diastolic parameters were normal. There is the interventricular septum is flattened in systole, consistent with right ventricular pressure overload.  2. Right ventricular systolic function is normal. The right ventricular size is normal. Tricuspid regurgitation signal is inadequate for assessing PA pressure.  3. The mitral valve is normal in  structure. No evidence of mitral valve regurgitation. No evidence of mitral stenosis.  4. The aortic valve is tricuspid. Aortic valve regurgitation is not visualized. Aortic valve sclerosis is present, with no evidence of aortic valve stenosis.  5. The inferior vena cava is normal in size with greater than 50% respiratory variability, suggesting right atrial pressure of 3 mmHg. FINDINGS  Left Ventricle: Left ventricular ejection fraction, by estimation, is 55 to 60%. The left ventricle has normal function. The left ventricle has no regional wall motion abnormalities. Definity contrast agent was given IV to delineate the left ventricular  endocardial borders. The left ventricular internal cavity size was normal in size. There is no left ventricular hypertrophy. The interventricular septum is flattened in systole, consistent with right ventricular pressure overload. Left ventricular diastolic parameters were normal. Right Ventricle: The right ventricular size is normal. No increase in right ventricular wall thickness. Right ventricular systolic function is normal. Tricuspid regurgitation signal is inadequate for assessing PA pressure. Left Atrium: Left atrial size was normal in size. Right Atrium: Right atrial size was normal in size. Pericardium: There is no evidence of pericardial effusion. Mitral  Valve: The mitral valve is normal in structure. No evidence of mitral valve regurgitation. No evidence of mitral valve stenosis. Tricuspid Valve: The tricuspid valve is normal in structure. Tricuspid valve regurgitation is not demonstrated. No evidence of tricuspid stenosis. Aortic Valve: The aortic valve is tricuspid. Aortic valve regurgitation is not visualized. Aortic valve sclerosis is present, with no evidence of aortic valve stenosis. Pulmonic Valve: The pulmonic valve was normal in structure. Pulmonic valve regurgitation is not visualized. No evidence of pulmonic stenosis. Aorta: The aortic root is normal in size  and structure. Venous: The inferior vena cava is normal in size with greater than 50% respiratory variability, suggesting right atrial pressure of 3 mmHg. IAS/Shunts: No atrial level shunt detected by color flow Doppler.  LEFT VENTRICLE PLAX 2D LVIDd:         5.00 cm   Diastology LVIDs:         3.20 cm   LV e' medial:    6.96 cm/s LV PW:         1.20 cm   LV E/e' medial:  8.6 LV IVS:        1.40 cm   LV e' lateral:   11.30 cm/s LVOT diam:     2.30 cm   LV E/e' lateral: 5.3 LV SV:         109 LV SV Index:   39 LVOT Area:     4.15 cm  RIGHT VENTRICLE             IVC RV S prime:     17.00 cm/s  IVC diam: 2.00 cm TAPSE (M-mode): 2.6 cm LEFT ATRIUM             Index LA diam:        4.70 cm 1.67 cm/m LA Vol (A2C):   58.5 ml 20.78 ml/m LA Vol (A4C):   69.6 ml 24.72 ml/m LA Biplane Vol: 66.7 ml 23.69 ml/m  AORTIC VALVE LVOT Vmax:   142.00 cm/s LVOT Vmean:  98.500 cm/s LVOT VTI:    0.262 m  AORTA Ao Root diam: 3.40 cm Ao Asc diam:  3.10 cm MITRAL VALVE MV Area (PHT): 4.49 cm    SHUNTS MV Decel Time: 169 msec    Systemic VTI:  0.26 m MV E velocity: 60.20 cm/s  Systemic Diam: 2.30 cm MV A velocity: 81.60 cm/s MV E/A ratio:  0.74 Morene Brownie Electronically signed by Morene Brownie Signature Date/Time: 01/04/2024/3:33:57 PM    Final    CT L-SPINE NO CHARGE Result Date: 01/02/2024 CLINICAL DATA:  Low back pain EXAM: CT Lumbar Spine with contrast TECHNIQUE: Technique: Multiplanar CT images of the lumbar spine were reconstructed from contemporary CT of the Abdomen and Pelvis. RADIATION DOSE REDUCTION: This exam was performed according to the departmental dose-optimization program which includes automated exposure control, adjustment of the mA and/or kV according to patient size and/or use of iterative reconstruction technique. CONTRAST:  No additional COMPARISON:  MRI from 08/05/2020 FINDINGS: Segmentation: 5 lumbar type vertebral bodies are well visualized. Alignment: Mild grade 1 anterolisthesis of L4 on L5 of a  degenerative nature. This is stable in appearance from the prior MRI. Vertebrae: No compression deformities are noted. Anterior osteophytic change is noted extending from T12 to L5. Extensive facet hypertrophic changes are noted. Paraspinal and other soft tissues: Within normal limits. Disc levels: Osteophytic changes are noted at T12-L1 causing central impingement upon the thecal sac. Mild disc bulging and facet hypertrophic changes with ligamentous hypertrophy is noted  at L1-2 and to a greater degree at L2-3 with severe central canal stenosis at L2-3. Disc bulging and ligamentous hypertrophy is seen at L3-4 and L4-5 with central canal stenosis. This is accentuated at L4-5 by the anterolisthesis. Mild disc bulging is noted at L5-S1. IMPRESSION: Extensive degenerative changes throughout the lumbar spine which have increased in the interval from the MRI. No compression deformity is seen. Electronically Signed   By: Oneil Devonshire M.D.   On: 01/02/2024 21:49   CT Angio Chest PE W/Cm &/Or Wo Cm Addendum Date: 01/02/2024 ADDENDUM REPORT: 01/02/2024 17:11 ADDENDUM: Findings conveyed to nurse practitioner Abigail. Additional consideration would be lobar pneumonia with superimposed pulmonary edema. Electronically Signed   By: Jackquline Boxer M.D.   On: 01/02/2024 17:11   Result Date: 01/02/2024 CLINICAL DATA:  Short of breath EXAM: CT ANGIOGRAPHY CHEST CT ABDOMEN AND PELVIS WITH CONTRAST TECHNIQUE: Multidetector CT imaging of the chest was performed using the standard protocol during bolus administration of intravenous contrast. Multiplanar CT image reconstructions and MIPs were obtained to evaluate the vascular anatomy. Multidetector CT imaging of the abdomen and pelvis was performed using the standard protocol during bolus administration of intravenous contrast. RADIATION DOSE REDUCTION: This exam was performed according to the departmental dose-optimization program which includes automated exposure control,  adjustment of the mA and/or kV according to patient size and/or use of iterative reconstruction technique. CONTRAST:  OMNIPAQUE  IOHEXOL  350 MG/ML SOLN COMPARISON:  None Available. FINDINGS: CTA CHEST FINDINGS Cardiovascular: No filling defects within the pulmonary arteries to suggest acute pulmonary embolism. No significant vascular findings. Normal heart size. No pericardial effusion. Coronary artery calcification and aortic atherosclerotic calcification. Mediastinum/Nodes: No axillary or supraclavicular adenopathy. No mediastinal or hilar adenopathy. No pericardial fluid. Esophagus normal. Lungs/Pleura: There is dense bilateral airspace disease. Confluent airspace disease in the RIGHT upper lobe with air bronchograms. Small RIGHT effusion. Musculoskeletal: No aggressive osseous lesion. Review of the MIP images confirms the above findings. CT ABDOMEN and PELVIS FINDINGS Hepatobiliary: No focal hepatic lesion. Normal gallbladder. No biliary duct dilatation. Common bile duct is normal. Pancreas: Pancreas is normal. No ductal dilatation. No pancreatic inflammation. Spleen: Normal spleen Adrenals/urinary tract: Adrenal glands normal. Bilateral small renal calculi. Ureters bladder normal. Stomach/Bowel: Stomach, small bowel, appendix, and cecum are normal. The colon and rectosigmoid colon are normal. Vascular/Lymphatic: Abdominal aorta is normal caliber. No periportal or retroperitoneal adenopathy. No pelvic adenopathy. Reproductive: Unremarkable Other: No free fluid. Musculoskeletal: No aggressive osseous lesion. Review of the MIP images confirms the above findings. IMPRESSION: CHEST: 1. No evidence acute pulmonary embolism. 2. Dense RIGHT upper lobe consolidation. Multifocal severe airspace disease within LEFT and RIGHT lung involving upper and lower lobes. Differential would include multifocal pneumonia versus atypical pneumonia versus in elation exposure or ARDS. 3. Small RIGHT effusion. PELVIS: 1. No acute  findings in the abdomen pelvis. 2. Bilateral nonobstructing renal calculi. 3.  Aortic Atherosclerosis (ICD10-I70.0). Electronically Signed: By: Jackquline Boxer M.D. On: 01/02/2024 17:05   CT ABDOMEN PELVIS W CONTRAST Addendum Date: 01/02/2024 ADDENDUM REPORT: 01/02/2024 17:11 ADDENDUM: Findings conveyed to nurse practitioner Abigail. Additional consideration would be lobar pneumonia with superimposed pulmonary edema. Electronically Signed   By: Jackquline Boxer M.D.   On: 01/02/2024 17:11   Result Date: 01/02/2024 CLINICAL DATA:  Short of breath EXAM: CT ANGIOGRAPHY CHEST CT ABDOMEN AND PELVIS WITH CONTRAST TECHNIQUE: Multidetector CT imaging of the chest was performed using the standard protocol during bolus administration of intravenous contrast. Multiplanar CT image reconstructions and MIPs were obtained  to evaluate the vascular anatomy. Multidetector CT imaging of the abdomen and pelvis was performed using the standard protocol during bolus administration of intravenous contrast. RADIATION DOSE REDUCTION: This exam was performed according to the departmental dose-optimization program which includes automated exposure control, adjustment of the mA and/or kV according to patient size and/or use of iterative reconstruction technique. CONTRAST:  OMNIPAQUE  IOHEXOL  350 MG/ML SOLN COMPARISON:  None Available. FINDINGS: CTA CHEST FINDINGS Cardiovascular: No filling defects within the pulmonary arteries to suggest acute pulmonary embolism. No significant vascular findings. Normal heart size. No pericardial effusion. Coronary artery calcification and aortic atherosclerotic calcification. Mediastinum/Nodes: No axillary or supraclavicular adenopathy. No mediastinal or hilar adenopathy. No pericardial fluid. Esophagus normal. Lungs/Pleura: There is dense bilateral airspace disease. Confluent airspace disease in the RIGHT upper lobe with air bronchograms. Small RIGHT effusion. Musculoskeletal: No aggressive  osseous lesion. Review of the MIP images confirms the above findings. CT ABDOMEN and PELVIS FINDINGS Hepatobiliary: No focal hepatic lesion. Normal gallbladder. No biliary duct dilatation. Common bile duct is normal. Pancreas: Pancreas is normal. No ductal dilatation. No pancreatic inflammation. Spleen: Normal spleen Adrenals/urinary tract: Adrenal glands normal. Bilateral small renal calculi. Ureters bladder normal. Stomach/Bowel: Stomach, small bowel, appendix, and cecum are normal. The colon and rectosigmoid colon are normal. Vascular/Lymphatic: Abdominal aorta is normal caliber. No periportal or retroperitoneal adenopathy. No pelvic adenopathy. Reproductive: Unremarkable Other: No free fluid. Musculoskeletal: No aggressive osseous lesion. Review of the MIP images confirms the above findings. IMPRESSION: CHEST: 1. No evidence acute pulmonary embolism. 2. Dense RIGHT upper lobe consolidation. Multifocal severe airspace disease within LEFT and RIGHT lung involving upper and lower lobes. Differential would include multifocal pneumonia versus atypical pneumonia versus in elation exposure or ARDS. 3. Small RIGHT effusion. PELVIS: 1. No acute findings in the abdomen pelvis. 2. Bilateral nonobstructing renal calculi. 3.  Aortic Atherosclerosis (ICD10-I70.0). Electronically Signed: By: Jackquline Boxer M.D. On: 01/02/2024 17:05   DG Chest 1 View Result Date: 01/02/2024 CLINICAL DATA:  Shortness of breath. EXAM: CHEST  1 VIEW COMPARISON:  09/28/2014 FINDINGS: Dense airspace consolidation in the right mid and upper lung zone. More diffusely nodular appearing airspace opacity in the left mid and lower lung zones. True lung nodules can not be excluded. No pleural fluid. Interval mildly enlarged cardiac silhouette. Moderate thoracic spine degenerative changes. IMPRESSION: 1. Dense airspace consolidation in the right mid and upper lung zone and more diffusely nodular appearing airspace opacity in the left mid and lower lung  zones. Findings are most consistent with multifocal pneumonia. Followup PA and lateral chest X-ray is recommended in 3-4 weeks following trial of antibiotic therapy to ensure resolution and exclude underlying malignancy. 2. Interval mild cardiomegaly. Electronically Signed   By: Elspeth Bathe M.D.   On: 01/02/2024 14:54     Assessment and Plan:  Elevated Troponin  - High-sensitivity troponin 137>109.  Note, troponins collected on 6/21 when patient was hypoxic and first diagnosed with pneumonia. - Patient denies chest pain  - Echocardiogram this admission showed EF 55-60% with no regional wall motion abnormalities, normal RV systolic function, no valvular abnormalities - Troponin elevation due to demand ischemia in the setting of hypoxia and pneumonia - No plans for ischemic evaluation at this time  Abnormal Echocardiogram  - Echocardiogram yesterday showed EF 55-60%, normal RV systolic function. interventricular septal flattening in systole consistent with RV pressure overload. - CXR this AM suggestive of improving pneumonia  - Patient received 1 dose of IV Lasix 40 mg today.  Output 1.25  L urine and is currently net -0.6 L since admission. - Continue IV Lasix 40 mg daily  - Follow strict I's/O's, daily weights, daily BMPs - Patient denies any issues with hypervolemia prior to admission. I am not convinced that he will need diuretics at DC. Would not start SGLT2i with his recurring UTIs    Otherwise per primary - Diffuse bilateral airspace disease most likely multilobar pneumonia - Severe OSA followed by PCCM - UTI - Type 2 diabetes - Back pain  Risk Assessment/Risk Scores:  For questions or updates, please contact [facility] Please consult [email or web address] for contact info under    Signed, Rollo FABIENE Louder, PA-C  01/05/2024 4:24 PM  Agree with note by Rollo Louder PA-C   We were asked to see this 53 year old morbidly obese Caucasian male with elevated troponins.   He was admitted with pneumonia.  He required BiPAP.  He has chronic right bundle branch block.  This troponins were mildly elevated in the 100 and flat.  He denied chest pain.  His 2D echo revealed normal LV systolic function.  I believe this is demand ischemia in the setting of respiratory distress.  Exam is benign.  No further workup required.  Dorn DOROTHA Lesches, M.D., FACP, [facility], FAHA, [facility]  [address] [place], [place]  [address]  [phone] 01/05/2024 4:49 PM

## 2024-01-05 NOTE — Plan of Care (Signed)
  Problem: Education: Goal: Ability to describe self-care measures that may prevent or decrease complications (Diabetes Survival Skills Education) will improve Outcome: Progressing   Problem: Tissue Perfusion: Goal: Adequacy of tissue perfusion will improve Outcome: Progressing

## 2024-01-05 NOTE — Plan of Care (Signed)

## 2024-01-05 NOTE — Telephone Encounter (Signed)
 Please schedule patient for hospital follow up for pneumonia and monitoring of OSA on CPAP with Dr. Jude in the next 4-6 weeks.  Thanks, JD

## 2024-01-06 ENCOUNTER — Inpatient Hospital Stay (HOSPITAL_COMMUNITY)

## 2024-01-06 DIAGNOSIS — J9601 Acute respiratory failure with hypoxia: Secondary | ICD-10-CM | POA: Diagnosis not present

## 2024-01-06 LAB — CBC WITH DIFFERENTIAL/PLATELET
Abs Immature Granulocytes: 0.53 10*3/uL — ABNORMAL HIGH (ref 0.00–0.07)
Basophils Absolute: 0.1 10*3/uL (ref 0.0–0.1)
Basophils Relative: 1 %
Eosinophils Absolute: 0.1 10*3/uL (ref 0.0–0.5)
Eosinophils Relative: 1 %
HCT: 40.2 % (ref 39.0–52.0)
Hemoglobin: 13.4 g/dL (ref 13.0–17.0)
Immature Granulocytes: 5 %
Lymphocytes Relative: 27 %
Lymphs Abs: 2.8 10*3/uL (ref 0.7–4.0)
MCH: 31.3 pg (ref 26.0–34.0)
MCHC: 33.3 g/dL (ref 30.0–36.0)
MCV: 93.9 fL (ref 80.0–100.0)
Monocytes Absolute: 1 10*3/uL (ref 0.1–1.0)
Monocytes Relative: 10 %
Neutro Abs: 5.8 10*3/uL (ref 1.7–7.7)
Neutrophils Relative %: 56 %
Platelets: 159 10*3/uL (ref 150–400)
RBC: 4.28 MIL/uL (ref 4.22–5.81)
RDW: 14 % (ref 11.5–15.5)
WBC: 10.3 10*3/uL (ref 4.0–10.5)
nRBC: 0.2 % (ref 0.0–0.2)

## 2024-01-06 LAB — BASIC METABOLIC PANEL WITH GFR
Anion gap: 9 (ref 5–15)
BUN: 28 mg/dL — ABNORMAL HIGH (ref 6–20)
CO2: 27 mmol/L (ref 22–32)
Calcium: 8.4 mg/dL — ABNORMAL LOW (ref 8.9–10.3)
Chloride: 100 mmol/L (ref 98–111)
Creatinine, Ser: 1 mg/dL (ref 0.61–1.24)
GFR, Estimated: >60 mL/min (ref >60)
Glucose, Bld: 104 mg/dL — ABNORMAL HIGH (ref 70–99)
Potassium: 3.7 mmol/L (ref 3.5–5.1)
Sodium: 136 mmol/L (ref 135–145)

## 2024-01-06 LAB — GLUCOSE, CAPILLARY
Glucose-Capillary: 102 mg/dL — ABNORMAL HIGH (ref 70–99)
Glucose-Capillary: 104 mg/dL — ABNORMAL HIGH (ref 70–99)
Glucose-Capillary: 112 mg/dL — ABNORMAL HIGH (ref 70–99)
Glucose-Capillary: 113 mg/dL — ABNORMAL HIGH (ref 70–99)
Glucose-Capillary: 142 mg/dL — ABNORMAL HIGH (ref 70–99)
Glucose-Capillary: 144 mg/dL — ABNORMAL HIGH (ref 70–99)
Glucose-Capillary: 181 mg/dL — ABNORMAL HIGH (ref 70–99)

## 2024-01-06 MED ORDER — CEFUROXIME AXETIL 250 MG PO TABS
500.0000 mg | ORAL_TABLET | Freq: Two times a day (BID) | ORAL | Status: DC
  Administered 2024-01-06 – 2024-01-07 (×2): 500 mg via ORAL
  Filled 2024-01-06 (×3): qty 2

## 2024-01-06 MED ORDER — MORPHINE SULFATE (PF) 2 MG/ML IV SOLN: 2.0000 mg | INTRAVENOUS | Status: DC

## 2024-01-06 MED ORDER — HYDROCODONE-ACETAMINOPHEN 5-325 MG PO TABS
1.0000 | ORAL_TABLET | Freq: Four times a day (QID) | ORAL | Status: DC | PRN
  Administered 2024-01-06: 1 via ORAL
  Filled 2024-01-06: qty 1

## 2024-01-06 MED ORDER — AZITHROMYCIN 250 MG PO TABS
250.0000 mg | ORAL_TABLET | Freq: Every day | ORAL | Status: DC
  Administered 2024-01-06 – 2024-01-07 (×2): 250 mg via ORAL
  Filled 2024-01-06 (×2): qty 1

## 2024-01-06 NOTE — Progress Notes (Signed)
 Mobility Specialist Progress Note:   01/06/24 1405  Mobility  Activity Ambulated with assistance in hallway  Level of Assistance Contact guard assist, steadying assist  Assistive Device Front wheel walker  Distance Ambulated (ft) 200 ft  Activity Response Tolerated well  Mobility Referral Yes  Mobility visit 1 Mobility  Mobility Specialist Start Time (ACUTE ONLY) 1405  Mobility Specialist Stop Time (ACUTE ONLY) 1425  Mobility Specialist Time Calculation (min) (ACUTE ONLY) 20 min   Pt agreeable to session. No c/o and stated they were feeling better than yesterday. During walk, O2 desat to 88% w/ slight increase in SOB; increased to 1L which made the pt stable. Left pt in chair vitals stable and all needs met.   Pre Mobility: Recliner SpO2 93% RA During Mobility:Declined to 88% RA; increased 1L [place] SpO2 91% Post Mobility:Recliner SpO2 95% RA  Therisa Rana Mobility Specialist Please contact via Special educational needs teacher or  Rehab office at [phone]

## 2024-01-06 NOTE — Plan of Care (Signed)
   Problem: Education: Goal: Ability to describe self-care measures that may prevent or decrease complications (Diabetes Survival Skills Education) will improve Outcome: Progressing Goal: Individualized Educational Video(s) Outcome: Progressing

## 2024-01-06 NOTE — TOC CM/SW Note (Addendum)
 Transition of Care [facility]) - Inpatient Brief Assessment   Patient Details  Name: Patrick Baxter MRN: [id number] Date of Birth: 1970/09/25  Transition of Care [facility]) CM/SW Contact:    Waddell Barnie Rama, RN Phone Number: 01/06/2024, 11:56 AM   Clinical Narrative: From home alone, has PCP and insurance on file, states has no HH services in place at this time, has a cane  at home.  States a friend , Majorie Seip  will transport them home at [employer] and  Dad is support system, states gets medications from [facility] on [address].  Pta self ambulatory with cane.  He states he eats between a no sodium and then sometimes a sodium diet (processed foods), he will purchase himself a scale to weigh daily at home.  Per pt/ot eval rec HHPT, HHOT,  and rolling walker and bsc.  NCM offered choice, he has no preference for agency, and states he does not need a bsc.  NCM made referral to Jermaine with [employer] for the bariatric rolling walker , this will be brought up to patient's room.  NCM made referral to [facility] with [facility] for HHPT, HHOT.  Awaiting to hear back to see if he can take referral.  Per Darleene, can not take referral, NCM made referral to [facility] with [facility]. Awaiting to hear back.  Brandi with [facility] is able to take referral for HHPT, HHOT.  Soc will begin 24 to 48 hrs post dc.     Transition of Care Asessment: Insurance and Status: Insurance coverage has been reviewed Patient has primary care physician: Yes Home environment has been reviewed: home alone Prior level of function:: indep Prior/Current Home Services: Current home services (cane) Social Drivers of Health Review: SDOH reviewed no interventions necessary Readmission risk has been reviewed: Yes Transition of care needs: transition of care needs identified, TOC will continue to follow

## 2024-01-06 NOTE — Progress Notes (Signed)
 PT Cancellation Note  Patient Details Name: Sameul Tagle MRN: [id number] DOB: 1970/08/29   Cancelled Treatment:    Reason Eval/Treat Not Completed: Other (comment). Attempted a couple of times and busy with other providers. Currently amb in hall with OT. Will follow up tomorrow.    Rodgers ORN [facility] 01/06/2024, 3:29 PM Rodgers Opal PT Acute [employer] [phone]

## 2024-01-06 NOTE — Progress Notes (Signed)
 Occupational Therapy Treatment Patient Details Name: Patrick Baxter MRN: [id number] DOB: 05/08/1971 Today's Date: 01/06/2024   History of present illness 53 yo male adm 01/02/24 with back pain, SOB, hypoxia, CAP, UTI. PMhx; obesity, OSA, DM, HTN, HLD   OT comments  Pt progressing well towards goals. Session focused on education for ADLs. Reviewed DME and AE to assist with ADLs. Provided and reviewed energy conservation handout with pt. Pt verbalzied understanding of all education provided. Mobilized in the hallway 100+ ft, maintaining O2 levels on RA. Pt continues to be limited by decreased strength and activity tolerance. Continue to recommend HHOT to optimize independence levels. Will continue to follow acutely.       If plan is discharge home, recommend the following:  A little help with walking and/or transfers;A lot of help with bathing/dressing/bathroom;Assistance with cooking/housework;Assist for transportation;Help with stairs or ramp for entrance   Equipment Recommendations  BSC/3in1    Recommendations for Other Services      Precautions / Restrictions Precautions Precaution/Restrictions Comments: watch sats Restrictions Weight Bearing Restrictions Per Provider Order: No       Mobility Bed Mobility Overal bed mobility: Needs Assistance Bed Mobility: Supine to Sit     Supine to sit: Contact guard     General bed mobility comments: use of rail and increased time    Transfers Overall transfer level: Needs assistance Equipment used: Rolling walker (2 wheels) Transfers: Sit to/from Stand, Bed to chair/wheelchair/BSC Sit to Stand: Contact guard assist     Step pivot transfers: Supervision     General transfer comment: CGA, slow to stand, supervision once in standing     Balance Overall balance assessment: Mild deficits observed, not formally tested       ADL either performed or assessed with clinical judgement   ADL Overall ADL's : Needs  assistance/impaired       Lower Body Dressing: Supervision/safety;Sit to/from stand Lower Body Dressing Details (indicate cue type and reason): Pt uses sock aid at base, educated on use of reacher to assist             Functional mobility during ADLs: Supervision/safety;Rolling walker (2 wheels) General ADL Comments: Educated on use of compensatory strategies and AE to assist in ADLs    Extremity/Trunk Assessment Upper Extremity Assessment Upper Extremity Assessment: Generalized weakness   Lower Extremity Assessment Lower Extremity Assessment: Defer to PT evaluation        Vision   Vision Assessment?: No apparent visual deficits   Perception Perception Perception: Not tested   Praxis Praxis Praxis: Not tested   Communication Communication Communication: No apparent difficulties   Cognition Arousal: Alert Behavior During Therapy: WFL for tasks assessed/performed Cognition: No apparent impairments       Following commands: Intact        Cueing   Cueing Techniques: Verbal cues        General Comments VSS on RA, O2 levels 92% and above Access Code: [email or web address]  URL: [email or web address]  Date: 01/06/2024  Prepared by: Adrianne Savers    Patient Education  - Understanding Energy Conservation  - Energy Conservation During Daily Tasks    Pertinent Vitals/ Pain       Pain Assessment Pain Assessment: Faces Faces Pain Scale: Hurts even more Pain Location: back Pain Descriptors / Indicators: Discomfort Pain Intervention(s): Monitored during session   Frequency  Min 2X/week        Progress Toward Goals  OT Goals(current goals can now be found in the care plan section)  Progress  towards OT goals: Progressing toward goals  Acute Rehab OT Goals Patient Stated Goal: To return to work OT Goal Formulation: With patient Time For Goal Achievement: 01/18/24 Potential to Achieve Goals: Good ADL Goals Pt Will Perform Upper Body Dressing: with  supervision;sitting Pt Will Perform Lower Body Dressing: with supervision;sit to/from stand;sitting/lateral leans Pt Will Transfer to Toilet: with supervision;ambulating;regular height toilet Additional ADL Goal #1: pt will verbalize x3 energy conservation strategies in prep for ADLs  Plan         AM-PAC OT 6 Clicks Daily Activity     Outcome Measure   Help from another person eating meals?: None Help from another person taking care of personal grooming?: A Little Help from another person toileting, which includes using toliet, bedpan, or urinal?: A Little Help from another person bathing (including washing, rinsing, drying)?: A Little Help from another person to put on and taking off regular upper body clothing?: A Little Help from another person to put on and taking off regular lower body clothing?: A Little 6 Click Score: 19    End of Session Equipment Utilized During Treatment: Gait belt;Rolling walker (2 wheels);Oxygen  OT Visit Diagnosis: Unsteadiness on feet (R26.81);Other abnormalities of gait and mobility (R26.89);Muscle weakness (generalized) (M62.81)   Activity Tolerance Patient tolerated treatment well   Patient Left in chair;with call bell/phone within reach   Nurse Communication Mobility status        Time: 1515-1540 OT Time Calculation (min): 25 min  Charges: OT General Charges $OT Visit: 1 Visit OT Treatments $Self Care/Home Management : 23-37 mins  Adrianne BROCKS, OT  Acute Rehabilitation Services Office [phone] Secure chat preferred   Adrianne GORMAN Savers 01/06/2024, 4:27 PM

## 2024-01-06 NOTE — Progress Notes (Signed)
 Patient will self administer CPAP when ready for bed using hospital provided machine and FFM. CPAP 10 cmH20, 21% FiO2. Machine is within patient's reach and patient is familiar with equipment and procedure.

## 2024-01-06 NOTE — Progress Notes (Addendum)
 PROGRESS NOTE  Patrick Baxter FMW:[id number] DOB: 07-02-1971 DOA: 01/02/2024 PCP: Bulah Alm RAMAN, PA-C  HPI/Recap of past 24 hours: 48/M with 53/M with a history of obesity, DM, HTN, OSA on CPAP who presented for worsening back pain and some shortness of breath and has been ongoing for the past few days.  Patient had a tele visit for back pain, radiating into hips, and he was prescribed prednisone , flexeril , and hydrocodone . Due to worsening pain, EMS was called, noted he was saturating at 68% on room air, placed on NRB and achieved saturations of 90%. In the ED, patient was placed on heated high flow nasal cannula, PCCM consulted for admission further management.  Patient was able to be weaned off to about 6 L of nasal cannula and further stabilized.  [facility] assumed care on 01/04/2024.  Subjective -Reports ongoing issues with his lower back pain near his sacrum -Breathing better, used CPAP last night -Prior MD notes reviewed    Assessment/Plan:  Acute respiratory failure with hypoxia Multifocal pneumonia -Improving, treated with broad-spectrum antibiotics as well as steroids -Respiratory virus panel negative, blood cultures negative, sputum cultures pending, CTA chest negative for PE -Repeat x-ray improving -Will transition antibiotics to oral cefdinir -Switched from Solu-Medrol  to prednisone  yesterday -Increase activity, wean O2  E. coli UTI Urine culture grew greater than 100,000 and E. coli CT abdomen/pelvis, with bilateral nonobstructing renal calculi, otherwise unremarkable -abx as above - Advised follow-up with urology given recurrent recurrent UTIs  Elevated troponin 137--100, flat trend, no ACS Echo with EF of 55 to 60%, no regional wall motion abnormality, interventricular septum is flattened in systole consistent with right ventricular pressure overload   DM type 2 Last A1c on 01/02/2024 was 6.3 CBGs uncontrolled likely 2/2 steroid use Continue Semglee, CBG stable  today  Hypertension BP stable Start PTA Toprol  50 mg daily, continue to hold lisinopril  20 mg daily  Hypothyroidism Continue Synthroid    Back pain Supportive care PT/OT -Needs to lose weight, check x-ray   Gout Continue Allopurinol     Allergic rhinitis Continue Singular    Rash under pannus Present on admission Continue nystatin  powder    Severe OSA Continue with inpatient CPAP with oxygen   Morbid obesity Lifestyle modification advised - Reports being on oral GLP-1 agonist    Estimated body mass index is 61.84 kg/m as calculated from the following:   Height as of this encounter: 5' 8.5 (1.74 m).   Weight as of this encounter: 187.2 kg.     Code Status: Full  Family Communication: None at bedside  Disposition Plan: Home in 1 to 2 days      Consultants: PCCM Cardiology  DVT prophylaxis: Heparin  [place]   Objective: Vitals:   01/05/24 1929 01/05/24 2359 01/06/24 0411 01/06/24 0730  BP: 116/82 130/64 106/73 113/65  Pulse: 71 63 65 63  Resp: 18 18 18 19   Temp: 98.2 F (36.8 C) 98.7 F (37.1 C) 98 F (36.7 C) 97.6 F (36.4 C)  TempSrc: Oral Axillary Oral Oral  SpO2: 98% 100% 92% 98%  Weight:   (!) 187.2 kg   Height:        Intake/Output Summary (Last 24 hours) at 01/06/2024 1123 Last data filed at 01/06/2024 1010 Gross per 24 hour  Intake 480 ml  Output 3750 ml  Net -3270 ml   Filed Weights   01/04/24 0500 01/05/24 0058 01/06/24 0411  Weight: (!) 190.3 kg (!) 190 kg (!) 187.2 kg    Exam: Gen: Awake, Alert, Oriented  X 3, morbidly obese HEENT: Bil to assess JVD Lungs: Few scattered rhonchi CVS: S1S2/RRR Abd: soft, Non tender, non distended, BS present Extremities: No edema Skin: no new rashes on exposed skin     Data Reviewed: CBC: Recent Labs  Lab 01/02/24 1355 01/02/24 1545 01/02/24 1705 01/03/24 0307 01/04/24 0435 01/05/24 0223 01/06/24 0228  WBC 12.9*  --   --  9.0 9.1 8.9 10.3  NEUTROABS 11.3*  --   --   --   --  7.0  5.8  HGB 14.2   < > 13.6 13.6 12.9* 13.0 13.4  HCT 41.4   < > 40.0 40.1 39.0 39.0 40.2  MCV 91.6  --   --  93.9 94.4 93.3 93.9  PLT 125*  --   --  112* 125* 145* 159   < > = values in this interval not displayed.   Basic Metabolic Panel: Recent Labs  Lab 01/02/24 1355 01/02/24 1545 01/02/24 1705 01/03/24 0307 01/04/24 0435 01/05/24 0223 01/06/24 0228  NA 137   < > 137 136 137 137 136  K 4.1   < > 4.1 4.2 4.3 4.1 3.7  CL 104  --   --  108 108 105 100  CO2 19*  --   --  20* 21* 23 27  GLUCOSE 198*  --   --  259* 221* 147* 104*  BUN 21*  --   --  25* 32* 30* 28*  CREATININE 1.09  --   --  0.91 0.98 0.91 1.00  CALCIUM  8.3*  --   --  8.3* 8.2* 8.2* 8.4*  MG  --   --   --  2.5*  --   --   --   PHOS  --   --   --  3.1  --   --   --    < > = values in this interval not displayed.   GFR: Estimated Creatinine Clearance: 139.3 mL/min (by C-G formula based on SCr of 1 mg/dL). Liver Function Tests: Recent Labs  Lab 01/02/24 1355  AST 40  ALT 35  ALKPHOS 68  BILITOT 0.9  PROT 6.6  ALBUMIN 2.5*   No results for input(s): LIPASE, AMYLASE in the last 168 hours. No results for input(s): AMMONIA in the last 168 hours. Coagulation Profile: Recent Labs  Lab 01/02/24 1959  INR 1.3*   Cardiac Enzymes: No results for input(s): CKTOTAL, CKMB, CKMBINDEX, TROPONINI in the last 168 hours. BNP (last 3 results) No results for input(s): PROBNP in the last 8760 hours. HbA1C: No results for input(s): HGBA1C in the last 72 hours.  CBG: Recent Labs  Lab 01/05/24 2007 01/06/24 0004 01/06/24 0412 01/06/24 0749 01/06/24 1058  GLUCAP 122* 102* 104* 113* 181*   Lipid Profile: No results for input(s): CHOL, HDL, LDLCALC, TRIG, CHOLHDL, LDLDIRECT in the last 72 hours. Thyroid Function Tests: No results for input(s): TSH, T4TOTAL, FREET4, T3FREE, THYROIDAB in the last 72 hours. Anemia Panel: No results for input(s): VITAMINB12, FOLATE,  FERRITIN, TIBC, IRON, RETICCTPCT in the last 72 hours. Urine analysis:    Component Value Date/Time   COLORURINE AMBER (A) 01/02/2024 1405   APPEARANCEUR CLOUDY (A) 01/02/2024 1405   APPEARANCEUR CANCELED 12/11/2023 1004   LABSPEC 1.025 01/02/2024 1405   LABSPEC 1.015 12/11/2023 0958   PHURINE 5.0 01/02/2024 1405   GLUCOSEU NEGATIVE 01/02/2024 1405   HGBUR LARGE (A) 01/02/2024 1405   BILIRUBINUR NEGATIVE 01/02/2024 1405   BILIRUBINUR CANCELED 12/11/2023 1004   BILIRUBINUR negative  12/11/2023 0958   KETONESUR NEGATIVE 01/02/2024 1405   PROTEINUR 100 (A) 01/02/2024 1405   UROBILINOGEN negative (A) 10/28/2016 1044   NITRITE NEGATIVE 01/02/2024 1405   LEUKOCYTESUR LARGE (A) 01/02/2024 1405   Sepsis Labs: @LABRCNTIP (procalcitonin:4,lacticidven:4)  ) Recent Results (from the past 240 hours)  Resp panel by RT-PCR (RSV, Flu A&B, Covid) Anterior Nasal Swab     Status: None   Collection Time: 01/02/24  1:55 PM   Specimen: Anterior Nasal Swab  Result Value Ref Range Status   SARS Coronavirus 2 by RT PCR NEGATIVE NEGATIVE Final   Influenza A by PCR NEGATIVE NEGATIVE Final   Influenza B by PCR NEGATIVE NEGATIVE Final    Comment: (NOTE) The Xpert Xpress SARS-CoV-2/FLU/RSV plus assay is intended as an aid in the diagnosis of influenza from Nasopharyngeal swab specimens and should not be used as a sole basis for treatment. Nasal washings and aspirates are unacceptable for Xpert Xpress SARS-CoV-2/FLU/RSV testing.  Fact Sheet for Patients: BloggerCourse.com  Fact Sheet for Healthcare Providers: SeriousBroker.it  This test is not yet approved or cleared by the [place]  FDA and has been authorized for detection and/or diagnosis of SARS-CoV-2 by FDA under an Emergency Use Authorization (EUA). This EUA will remain in effect (meaning this test can be used) for the duration of the COVID-19 declaration under Section 564(b)(1) of  the Act, 21 U.S.C. section 360bbb-3(b)(1), unless the authorization is terminated or revoked.     Resp Syncytial Virus by PCR NEGATIVE NEGATIVE Final    Comment: (NOTE) Fact Sheet for Patients: BloggerCourse.com  Fact Sheet for Healthcare Providers: SeriousBroker.it  This test is not yet approved or cleared by the [place]  FDA and has been authorized for detection and/or diagnosis of SARS-CoV-2 by FDA under an Emergency Use Authorization (EUA). This EUA will remain in effect (meaning this test can be used) for the duration of the COVID-19 declaration under Section 564(b)(1) of the Act, 21 U.S.C. section 360bbb-3(b)(1), unless the authorization is terminated or revoked.  Performed at [facility], [address], [place], [place] [address]   Culture, blood (Routine x 2)     Status: None (Preliminary result)   Collection Time: 01/02/24  1:57 PM   Specimen: BLOOD RIGHT HAND  Result Value Ref Range Status   Specimen Description BLOOD RIGHT HAND  Final   Special Requests   Final    BOTTLES DRAWN AEROBIC AND ANAEROBIC Blood Culture adequate volume   Culture   Final    NO GROWTH 4 DAYS Performed at [facility], [address], [place], [place] [address]    Report Status PENDING  Incomplete  Culture, blood (Routine x 2)     Status: None (Preliminary result)   Collection Time: 01/02/24  2:02 PM   Specimen: BLOOD RIGHT HAND  Result Value Ref Range Status   Specimen Description BLOOD RIGHT HAND  Final   Special Requests   Final    BOTTLES DRAWN AEROBIC AND ANAEROBIC Blood Culture results may not be optimal due to an inadequate volume of blood received in culture bottles   Culture   Final    NO GROWTH 4 DAYS Performed at [facility], [address] [place], [place] [address]    Report Status PENDING  Incomplete  Urine Culture     Status: Abnormal   Collection Time: 01/02/24  2:05 PM   Specimen: Urine,  Random  Result Value Ref Range Status   Specimen Description URINE, RANDOM  Final  Special Requests NONE Reflexed from [id number]  Final   Culture (A)  Final    >=100,000 COLONIES/mL ESCHERICHIA COLI Two isolates with different morphologies were identified as the same organism.The most resistant organism was reported. Performed at [facility], [address] [place], [place] [address]    Report Status 01/05/2024 FINAL  Final   Organism ID, Bacteria ESCHERICHIA COLI (A)  Final      Susceptibility   Escherichia coli - MIC*    AMPICILLIN >=32 RESISTANT Resistant     CEFAZOLIN <=4 SENSITIVE Sensitive     CEFEPIME <=0.12 SENSITIVE Sensitive     CEFTRIAXONE  <=0.25 SENSITIVE Sensitive     CIPROFLOXACIN  >=4 RESISTANT Resistant     GENTAMICIN <=1 SENSITIVE Sensitive     IMIPENEM <=0.25 SENSITIVE Sensitive     NITROFURANTOIN <=16 SENSITIVE Sensitive     TRIMETH/SULFA >=320 RESISTANT Resistant     AMPICILLIN/SULBACTAM 16 INTERMEDIATE Intermediate     PIP/TAZO <=4 SENSITIVE Sensitive ug/mL    * >=100,000 COLONIES/mL ESCHERICHIA COLI  Respiratory (~20 pathogens) panel by PCR     Status: None   Collection Time: 01/02/24  5:30 PM   Specimen: Nasopharyngeal Swab; Respiratory  Result Value Ref Range Status   Adenovirus NOT DETECTED NOT DETECTED Final   Coronavirus 229E NOT DETECTED NOT DETECTED Final    Comment: (NOTE) The Coronavirus on the Respiratory Panel, DOES NOT test for the novel  Coronavirus (2019 nCoV)    Coronavirus HKU1 NOT DETECTED NOT DETECTED Final   Coronavirus NL63 NOT DETECTED NOT DETECTED Final   Coronavirus OC43 NOT DETECTED NOT DETECTED Final   Metapneumovirus NOT DETECTED NOT DETECTED Final   Rhinovirus / Enterovirus NOT DETECTED NOT DETECTED Final   Influenza A NOT DETECTED NOT DETECTED Final   Influenza B NOT DETECTED NOT DETECTED Final   Parainfluenza Virus 1 NOT DETECTED NOT DETECTED Final   Parainfluenza Virus 2 NOT DETECTED NOT DETECTED Final    Parainfluenza Virus 3 NOT DETECTED NOT DETECTED Final   Parainfluenza Virus 4 NOT DETECTED NOT DETECTED Final   Respiratory Syncytial Virus NOT DETECTED NOT DETECTED Final   Bordetella pertussis NOT DETECTED NOT DETECTED Final   Bordetella Parapertussis NOT DETECTED NOT DETECTED Final   Chlamydophila pneumoniae NOT DETECTED NOT DETECTED Final   Mycoplasma pneumoniae NOT DETECTED NOT DETECTED Final    Comment: Performed at [facility], [address], [place], [place] [address]  SARS Coronavirus 2 by RT PCR (hospital order, performed in [facility] hospital lab) *cepheid single result test* Anterior Nasal Swab     Status: None   Collection Time: 01/02/24  5:30 PM   Specimen: Anterior Nasal Swab  Result Value Ref Range Status   SARS Coronavirus 2 by RT PCR NEGATIVE NEGATIVE Final    Comment: Performed at [facility], [address], [place], [place] [address]  MRSA Next Gen by PCR, Nasal     Status: Abnormal   Collection Time: 01/02/24  5:30 PM   Specimen: Nasal Mucosa; Nasal Swab  Result Value Ref Range Status   MRSA by PCR Next Gen DETECTED (A) NOT DETECTED Final    Comment: RESULT CALLED TO, READ BACK BY AND VERIFIED WITH: RN VINA DEL 2106 [phone] FCP (NOTE) The GeneXpert MRSA Assay (FDA approved for NASAL specimens only), is one component of a comprehensive MRSA colonization surveillance program. It is not intended to diagnose MRSA infection nor to guide or monitor treatment for MRSA infections. Test performance is not FDA  approved in patients less than 21 years old. Performed at [facility], [address], [place], [place] [address]   Expectorated Sputum Assessment w Gram Stain, Rflx to Resp Cult     Status: None   Collection Time: 01/02/24  6:21 PM   Specimen: Sputum  Result Value Ref Range Status   Specimen Description SPUTUM  Final   Special Requests NONE  Final   Sputum evaluation   Final    Sputum specimen not acceptable for testing.  Please recollect.    NOTIFIED RN VELMA MANILA ON 01/04/24 @ 1546 BY DRT Performed at [facility], [address], [place], [place] [address]    Report Status 01/04/2024 FINAL  Final      Studies: No results found.   Scheduled Meds:  allopurinol   300 mg Oral Daily   Chlorhexidine Gluconate Cloth  6 each Topical Daily   FLUoxetine   40 mg Oral Daily   furosemide  40 mg Intravenous Daily   heparin   5,000 Units Subcutaneous Q8H   insulin  aspart  0-20 Units Subcutaneous Q4H   insulin  glargine-yfgn  10 Units Subcutaneous Daily   levothyroxine   25 mcg Oral Q0600   lidocaine   2 patch Transdermal Q24H   linezolid  600 mg Oral Q12H   loratadine   10 mg Oral Daily   metoprolol  succinate  50 mg Oral Daily   montelukast   10 mg Oral QHS   mupirocin ointment   Nasal BID   nystatin    Topical BID   predniSONE   40 mg Oral Q breakfast   Followed by   NOREEN ON 01/09/2024] predniSONE   30 mg Oral Q breakfast   Followed by   NOREEN ON 01/12/2024] predniSONE   20 mg Oral Q breakfast   Followed by   NOREEN ON 01/15/2024] predniSONE   10 mg Oral Q breakfast   Followed by   NOREEN ON 01/18/2024] predniSONE   5 mg Oral Q breakfast    Continuous Infusions:  azithromycin  500 mg (01/05/24 1644)   cefTRIAXone  (ROCEPHIN )  IV 2 g (01/06/24 0900)     LOS: 4 days     Sigurd Pac, MD [facility]  If 7PM-7AM, please contact night-coverage [email or web address] 01/06/2024, 11:23 AM

## 2024-01-07 ENCOUNTER — Other Ambulatory Visit (HOSPITAL_COMMUNITY): Payer: Self-pay

## 2024-01-07 DIAGNOSIS — J9601 Acute respiratory failure with hypoxia: Secondary | ICD-10-CM | POA: Diagnosis not present

## 2024-01-07 LAB — CULTURE, BLOOD (ROUTINE X 2)
Culture: NO GROWTH
Culture: NO GROWTH
Special Requests: ADEQUATE

## 2024-01-07 LAB — CBC
HCT: 42 % (ref 39.0–52.0)
Hemoglobin: 14.2 g/dL (ref 13.0–17.0)
MCH: 31.1 pg (ref 26.0–34.0)
MCHC: 33.8 g/dL (ref 30.0–36.0)
MCV: 92.1 fL (ref 80.0–100.0)
Platelets: 188 10*3/uL (ref 150–400)
RBC: 4.56 MIL/uL (ref 4.22–5.81)
RDW: 13.8 % (ref 11.5–15.5)
WBC: 11.3 10*3/uL — ABNORMAL HIGH (ref 4.0–10.5)
nRBC: 0 % (ref 0.0–0.2)

## 2024-01-07 LAB — BASIC METABOLIC PANEL WITH GFR
Anion gap: 7 (ref 5–15)
BUN: 26 mg/dL — ABNORMAL HIGH (ref 6–20)
CO2: 28 mmol/L (ref 22–32)
Calcium: 8.4 mg/dL — ABNORMAL LOW (ref 8.9–10.3)
Chloride: 100 mmol/L (ref 98–111)
Creatinine, Ser: 0.94 mg/dL (ref 0.61–1.24)
GFR, Estimated: >60 mL/min (ref >60)
Glucose, Bld: 105 mg/dL — ABNORMAL HIGH (ref 70–99)
Potassium: 4 mmol/L (ref 3.5–5.1)
Sodium: 135 mmol/L (ref 135–145)

## 2024-01-07 LAB — GLUCOSE, CAPILLARY
Glucose-Capillary: 229 mg/dL — ABNORMAL HIGH (ref 70–99)
Glucose-Capillary: 121 mg/dL — ABNORMAL HIGH (ref 70–99)
Glucose-Capillary: 142 mg/dL — ABNORMAL HIGH (ref 70–99)
Glucose-Capillary: 160 mg/dL — ABNORMAL HIGH (ref 70–99)

## 2024-01-07 MED ORDER — COLCHICINE 0.6 MG PO TABS: 0.6000 mg | ORAL_TABLET | Freq: Every day | ORAL | Status: AC | PRN

## 2024-01-07 MED ORDER — CEFUROXIME AXETIL 500 MG PO TABS: 500.0000 mg | ORAL_TABLET | Freq: Two times a day (BID) | ORAL | 0 refills | Status: AC

## 2024-01-07 MED ORDER — PREDNISONE 10 MG PO TABS: ORAL_TABLET | ORAL | 0 refills | Status: DC

## 2024-01-07 MED ORDER — MECLIZINE HCL 25 MG PO TABS: 25.0000 mg | ORAL_TABLET | Freq: Two times a day (BID) | ORAL | Status: DC | PRN

## 2024-01-07 MED ORDER — POLYETHYLENE GLYCOL 3350 17 G PO PACK: 17.0000 g | PACK | Freq: Every day | ORAL | 0 refills | Status: DC | PRN

## 2024-01-07 NOTE — Progress Notes (Signed)
 DISCHARGE NOTE HOME Patrick Baxter to be discharged Home per MD order. Discussed prescriptions and follow up appointments with the patient. Prescriptions given to patient; medication list explained in detail. Patient verbalized understanding.  Skin clean, dry and intact without evidence of skin break down, no evidence of skin tears noted. IV catheter discontinued intact. Site without signs and symptoms of complications. Dressing and pressure applied. Pt denies pain at the site currently. No complaints noted.  Patient free of lines, drains, and wounds.   An After Visit Summary (AVS) was printed and given to the patient. Taken to Discharge lounge to wait for ride Patient will be  escorted via wheelchair, and discharged home via private auto.  [name], RN

## 2024-01-07 NOTE — Progress Notes (Signed)
 Physical Therapy Treatment Patient Details Name: Patrick Baxter MRN: [id number] DOB: 1971-05-26 Today's Date: 01/07/2024   History of Present Illness 53 yo male adm 01/02/24 with back pain, SOB, hypoxia, CAP, UTI. PMhx; obesity, OSA, DM, HTN, HLD    PT Comments  Pt received in chair after RN reviewed DC paperwork, pt agreeable to therapy session for stair training and discussion on self-monitoring for activity tolerance and activity pacing. Pt needing heavy minA to light modA for stability when ascending/descending steps with only single UE support or when ascending with weaker leg. Pt needing only CGA with bil rail support ascending/descending with improved body mechanics. Pt continues to benefit from PT services to progress toward functional mobility goals, continue to recommend HHPT to progress activity tolerance, work on strategies for energy conservation and reduced back pain which seems to contribute to his decreased activity level.     If plan is discharge home, recommend the following: A little help with walking and/or transfers;A little help with bathing/dressing/bathroom;Assistance with cooking/housework   Can travel by Media planner walker (2 wheels) (bariatric; recommend he obtain portable pulse ox on his own (likely not covered DME))    Recommendations for Other Services       Precautions / Restrictions Precautions Precautions: None Recall of Precautions/Restrictions: Intact Precaution/Restrictions Comments: watch sats Restrictions Weight Bearing Restrictions Per Provider Order: No     Mobility  Bed Mobility Overal bed mobility: Needs Assistance             General bed mobility comments: pt received/remains in chair    Transfers Overall transfer level: Needs assistance Equipment used: Rolling walker (2 wheels) Transfers: Sit to/from Stand, Bed to chair/wheelchair/BSC Sit to Stand: Supervision            General transfer comment: from chair<>bari RW x2 reps    Ambulation/Gait Ambulation/Gait assistance: Contact guard assist Gait Distance (Feet): 5 Feet Assistive device: Rolling walker (2 wheels) Gait Pattern/deviations: Decreased stride length       General Gait Details: distance limited due to emphasis mainly on stair negotiation/safety and activity pacing, pt is to discharge imminently so defer hallway distance gait trial   Stairs Stairs: Yes Stairs assistance: Min assist, Contact guard assist Stair Management: Two rails, One rail Left, Step to pattern, Forwards, Backwards Number of Stairs: 7 General stair comments: 7 platform step x6 reps with bil rails and CGA for safety when ascending with stronger leg/descending with weaker leg. MinA via HHA when only using single rail to ascend/descend same height step x2 reps and when ascending with weaker leg for practice/strengthening with significant LOB   Wheelchair Mobility     Tilt Bed    Modified Rankin (Stroke Patients Only)       Balance Overall balance assessment: Mild deficits observed, not formally tested                                          Communication Communication Communication: No apparent difficulties  Cognition Arousal: Alert Behavior During Therapy: WFL for tasks assessed/performed   PT - Cognitive impairments: No apparent impairments                         Following commands: Intact      Cueing Cueing Techniques: Verbal cues  Exercises  General Comments General comments (skin integrity, edema, etc.): SpO2 92% and above on RA with exertional tasks; Per MD pt SpO2 goal is 94% and greater, discussed with pt PTA recommendation to obtain portable pulse oximeter for home so he can track his HR/SpO2 to ensure he is within a safe level and so he can notify his PCP if exercise tolerance worsens or if hypoxia increases. Pt instructed on VO28max (220-age) formula for max HR  with exercise as well as self-monitoring and PRN rest breaks, pt receptive      Pertinent Vitals/Pain Pain Assessment Pain Assessment: Faces Faces Pain Scale: Hurts little more Pain Location: back and R knee Pain Descriptors / Indicators: Discomfort, Grimacing Pain Intervention(s): Limited activity within patient's tolerance, Monitored during session, Repositioned    Home Living                          Prior Function            PT Goals (current goals can now be found in the care plan section) Acute Rehab PT Goals Patient Stated Goal: return home and to work PT Goal Formulation: With patient Time For Goal Achievement: 01/18/24 Progress towards PT goals: Progressing toward goals    Frequency    Min 2X/week      PT Plan      Co-evaluation              AM-PAC PT 6 Clicks Mobility   Outcome Measure  Help needed turning from your back to your side while in a flat bed without using bedrails?: A Little Help needed moving from lying on your back to sitting on the side of a flat bed without using bedrails?: A Little Help needed moving to and from a bed to a chair (including a wheelchair)?: A Little Help needed standing up from a chair using your arms (e.g., wheelchair or bedside chair)?: A Little Help needed to walk in hospital room?: A Little Help needed climbing 3-5 steps with a railing? : A Lot (a lot with only single rail) 6 Click Score: 17    End of Session Equipment Utilized During Treatment: Gait belt Activity Tolerance: Patient tolerated treatment well Patient left: in chair;with call bell/phone within reach Nurse Communication: Mobility status;Other (comment) (pt ready to get dressed if NT can assist) PT Visit Diagnosis: Other abnormalities of gait and mobility (R26.89)     Time: 1300-1309 PT Time Calculation (min) (ACUTE ONLY): 9 min  Charges:    $Gait Training: 8-22 mins PT General Charges $$ ACUTE PT VISIT: 1 Visit                      Patrick Baxter P., PTA Acute Rehabilitation Services Secure Chat Preferred 9a-5:30pm Office: [phone]    Patrick Baxter 01/07/2024, 1:59 PM

## 2024-01-07 NOTE — Progress Notes (Signed)
   Patient Saturations on Room Air at Rest = 94%  Patient Saturations on [employer] while Ambulating = 93%

## 2024-01-07 NOTE — Telephone Encounter (Signed)
 Nothing available with Alva.

## 2024-01-07 NOTE — Discharge Summary (Signed)
 Physician Discharge Summary  Desean Heemstra FMW:[id number] DOB: 1970-08-23 DOA: 01/02/2024  PCP: Bulah Alm RAMAN, PA-C  Admit date: 01/02/2024 Discharge date: 01/07/2024  Admitted From: Home Disposition: Home  Recommendations for Outpatient Follow-up:  Follow up with PCP in 1 week with repeat CBC/BMP Follow up in ED if symptoms worsen or new appear   Home Health: No Equipment/Devices: None  Discharge Condition: Stable CODE STATUS: Full Diet recommendation: Heart healthy  Brief/Interim Summary: 53/M with a history of obesity, DM, HTN, OSA on CPAP who presented for worsening back pain and some shortness of breath.  On presentation, EMS found him saturating at 68% on room air; he was placed on nonrebreather.  In the ED, patient was placed on heated high flow nasal cannula.  He was admitted by PCCM and started on IV antibiotics and steroids for multifocal pneumonia.  He was subsequently weaned off to 6 L of nasal cannula and transferred to [facility] service from 01/04/2024 onwards.  He has then been transitioned to oral antibiotics and prednisone .  He is currently on room air.  He feels much better and feels comfortable home today.  He will be discharged home today with close outpatient follow-up with PCP.  Discharge Diagnoses:   Acute respiratory failure with hypoxia Multifocal pneumonia -Improving, treated with broad-spectrum antibiotics as well as steroids -Respiratory virus panel negative, blood cultures negative, sputum cultures pending, CTA chest negative for PE -Repeat x-ray improving -Switched from Solu-Medrol  to prednisone  already and pulmonary is recommending tapering prednisone  with outpatient follow-up with pulmonary.  Pulmonary has already signed off. -Currently on oral antibiotics.  Respiratory status has much improved and is currently on room air.  Feels okay to go home today.  Discharge home on oral Ceftin to complete course.    E. coli UTI Urine culture grew greater than  100,000 and E. coli CT abdomen/pelvis, with bilateral nonobstructing renal calculi, otherwise unremarkable - Discharge on oral Ceftin as above: Complete total of one 1 week course of antibiotic therapy. - Advised follow-up with urology given recurrent recurrent UTIs   Elevated troponin 137--100, flat trend, no ACS Echo with EF of 55 to 60%, no regional wall motion abnormality, interventricular septum is flattened in systole consistent with right ventricular pressure overload -No further workup needed.   DM type 2 Last A1c on 01/02/2024 was 6.3 CBGs uncontrolled likely 2/2 steroid use -Carb modified diet.  Resume home regimen.   Hypertension -BP stable -Continue Toprol .  Continue to hold lisinopril  20 mg daily till reevaluation by PCP   Hypothyroidism -Continue Synthroid    Back pain -Improving.  -Will need home health PT/OT -Needs to lose weight.  Lumbar and sacral spine x-ray unremarkable.  Might need outpatient MRI if pain does not get better.  Continue muscle relaxants and analgesics as needed.   Gout Continue Allopurinol     Allergic rhinitis Continue Singular    Rash under pannus Present on admission Continue nystatin  powder    Severe OSA Continue with inpatient CPAP with oxygen    Morbid obesity Lifestyle modification advised.  Outpatient follow-up. - Reports being on oral GLP-1 agonist     Discharge Instructions  Discharge Instructions     Diet - low sodium heart healthy   Complete by: As directed    Diet Carb Modified   Complete by: As directed    Increase activity slowly   Complete by: As directed       Allergies as of 01/07/2024       Reactions   Atorvastatin  Other (  See Comments)   myalgia   Pravastatin  Sodium Other (See Comments)   myalgia        Medication List     STOP taking these medications    FreeStyle Libre 3 Sensor Misc   HYDROcodone -acetaminophen  5-325 MG tablet Commonly known as: NORCO/VICODIN   ketoconazole  200 MG  tablet Commonly known as: NIZORAL    lisinopril  20 MG tablet Commonly known as: ZESTRIL        TAKE these medications    albuterol  108 (90 Base) MCG/ACT inhaler Commonly known as: VENTOLIN  HFA Inhale 2 puffs into the lungs every 6 (six) hours as needed for wheezing or shortness of breath.   allopurinol  300 MG tablet Commonly known as: ZYLOPRIM  TAKE 1 TABLET(300 MG) BY MOUTH DAILY   Calcium -Magnesium-Zinc 167-83-8 MG Tabs Take 1 tablet by mouth daily.   cefUROXime 500 MG tablet Commonly known as: CEFTIN Take 1 tablet (500 mg total) by mouth 2 (two) times daily with a meal for 2 days.   cetirizine 10 MG tablet Commonly known as: ZYRTEC Take 10 mg by mouth daily.   cholecalciferol 25 MCG (1000 UNIT) tablet Commonly known as: VITAMIN D3 Take 1 tablet (1,000 Units total) by mouth daily.   clotrimazole -betamethasone  cream Commonly known as: LOTRISONE  Apply 1 Application topically daily.   CO Q 10 PO Take 1 tablet by mouth daily at 6 (six) AM.   colchicine  0.6 MG tablet Take 1 tablet (0.6 mg total) by mouth daily as needed (for gout). What changed: See the new instructions.   cyclobenzaprine  10 MG tablet Commonly known as: FLEXERIL  Take 1 tablet (10 mg total) by mouth 3 (three) times daily as needed for muscle spasms.   FLUoxetine  40 MG capsule Commonly known as: PROZAC  TAKE 1 CAPSULE(40 MG) BY MOUTH DAILY   levothyroxine  25 MCG tablet Commonly known as: SYNTHROID  TAKE 1 TABLET BY MOUTH EVERY DAY BEFORE BREAKFAST   meclizine  25 MG tablet Commonly known as: ANTIVERT  Take 1 tablet (25 mg total) by mouth 2 (two) times daily as needed for dizziness.   metoprolol  succinate 50 MG 24 hr tablet Commonly known as: TOPROL -XL TAKE 1 TABLET BY MOUTH IMMEDIATLEY WITH OR IMMEDIATELY FOLLOWING A MEAL   montelukast  10 MG tablet Commonly known as: SINGULAIR  TAKE 1 TABLET(10 MG) BY MOUTH AT BEDTIME   MUCINEX PO Take 1 tablet by mouth every 4 (four) hours as needed (for  congestion).   multivitamin with minerals tablet Take 1 tablet by mouth daily.   naproxen  250 MG tablet Commonly known as: NAPROSYN  Take 250 mg by mouth 3 (three) times daily as needed for mild pain (pain score 1-3).   nitroGLYCERIN  0.4 MG SL tablet Commonly known as: NITROSTAT  Place 1 tablet (0.4 mg total) under the tongue every 5 (five) minutes as needed for chest pain.   nystatin  powder Commonly known as: MYCOSTATIN /NYSTOP  Apply 1 Application topically 2 (two) times daily.   phenylephrine 10 MG Tabs tablet Commonly known as: SUDAFED PE Take 10 mg by mouth every 4 (four) hours as needed.   polyethylene glycol 17 g packet Commonly known as: MIRALAX / GLYCOLAX Take 17 g by mouth daily as needed for moderate constipation.   predniSONE  10 MG tablet Commonly known as: DELTASONE  40mg  daily for 1 day, then 30 milligram daily for 3 days then 20 mg daily for 3 days then 10 mg daily for 3 days then 5 mg daily for 3 days then stop. Start taking on: January 08, 2024 What changed:  medication strength how much  to take how to take this when to take this additional instructions   PROBEC-T PO Take 1 tablet by mouth daily.   Rybelsus  3 MG Tabs Generic drug: Semaglutide  Take 3 mg by mouth daily.   Salmon Oil-1000 200 MG Caps Take 1 capsule by mouth daily.   sodium chloride  0.65 % Soln nasal spray Commonly known as: OCEAN Place 1 spray into both nostrils as needed for congestion.               Durable Medical Equipment  (From admission, onward)           Start     Ordered   01/06/24 1146  For home use only DME Walker rolling  Once       Comments: Bariatric  Question Answer Comment  Walker: With 5 Inch Wheels   Patient needs a walker to treat with the following condition Weakness      01/06/24 1145            Follow-up Information     [facility] Follow up.   Why: bariatric rolling walker Contact information: [phone]        Bulah Alm RAMAN, PA-C Follow up on 01/18/2024.   Specialty: Family Medicine Why: 11:35 for hospital follow up Contact information: [address] [place] [address] [phone]         [facility] Follow up.   Specialty: Home Health Services Why: Agency will call you to set up apt times Contact information: [address] [place] [address] [phone]                Allergies  Allergen Reactions   Atorvastatin  Other (See Comments)    myalgia   Pravastatin  Sodium Other (See Comments)    myalgia    Consultations: PCCM/cardiology   Procedures/Studies: DG Sacrum/Coccyx Result Date: 01/06/2024 CLINICAL DATA:  Lower back pain for 1 week. EXAM: SACRUM AND COCCYX - 2+ VIEW COMPARISON:  None Available. FINDINGS: There is no evidence of fracture or other focal bone lesions. IMPRESSION: Negative. Electronically Signed   By: Lynwood Landy Raddle M.D.   On: 01/06/2024 17:40   DG Lumbar Spine 2-3 Views Result Date: 01/06/2024 CLINICAL DATA:  Lower back pain for 1 week without reported injury. EXAM: LUMBAR SPINE - 2-3 VIEW COMPARISON:  January 02, 2024. FINDINGS: There is no evidence of lumbar spine fracture. Alignment is normal. Moderate degenerative disc disease is noted at T12-L1, L1-2 and L2-3. Mild degenerative disc disease is noted at L3-4 and L4-5. IMPRESSION: Multilevel degenerative changes.  No acute abnormality seen. Electronically Signed   By: Lynwood Landy Raddle M.D.   On: 01/06/2024 17:39   DG CHEST PORT 1 VIEW Result Date: 01/05/2024 CLINICAL DATA:  Dyspnea. EXAM: PORTABLE CHEST 1 VIEW COMPARISON:  January 02, 2024. FINDINGS: Stable cardiomediastinal silhouette. Left lung is clear. Right upper lobe opacity noted on prior exam is significantly smaller suggesting improving pneumonia. Bony thorax is unremarkable. IMPRESSION: Significantly decreased right upper lobe opacity most consistent with improving pneumonia. Continued radiographic follow-up is recommended to ensure  resolution. Electronically Signed   By: Lynwood Landy Raddle M.D.   On: 01/05/2024 12:17   ECHOCARDIOGRAM COMPLETE Result Date: 01/04/2024    ECHOCARDIOGRAM REPORT   Patient Name:   JAYMARION TROMBLY Date of Exam: 01/04/2024 Medical Rec #:  [id number]     Height:       68.5 in Accession #:    [id number]  Weight:       419.5 lb Date of Birth:  06-Jun-1971    BSA:          2.815 m Patient Age:    52 years      BP:           87/64 mmHg Patient Gender: M             HR:           86 bpm. Exam Location:  Inpatient Procedure: 2D Echo, Cardiac Doppler, Color Doppler and Intracardiac            Opacification Agent (Both Spectral and Color Flow Doppler were            utilized during procedure). Indications:    Dyspnea  History:        Patient has prior history of Echocardiogram examinations, most                 recent 09/29/2017. Risk Factors:Hypertension, Diabetes, Sleep                 Apnea and Former Smoker.  Sonographer:    Koleen Popper RDCS Referring Phys: [id number] LEBRON PARAS [facility]  Sonographer Comments: Patient is obese. IMPRESSIONS  1. Left ventricular ejection fraction, by estimation, is 55 to 60%. The left ventricle has normal function. The left ventricle has no regional wall motion abnormalities. Left ventricular diastolic parameters were normal. There is the interventricular septum is flattened in systole, consistent with right ventricular pressure overload.  2. Right ventricular systolic function is normal. The right ventricular size is normal. Tricuspid regurgitation signal is inadequate for assessing PA pressure.  3. The mitral valve is normal in structure. No evidence of mitral valve regurgitation. No evidence of mitral stenosis.  4. The aortic valve is tricuspid. Aortic valve regurgitation is not visualized. Aortic valve sclerosis is present, with no evidence of aortic valve stenosis.  5. The inferior vena cava is normal in size with greater than 50% respiratory variability, suggesting right atrial pressure  of 3 mmHg. FINDINGS  Left Ventricle: Left ventricular ejection fraction, by estimation, is 55 to 60%. The left ventricle has normal function. The left ventricle has no regional wall motion abnormalities. Definity contrast agent was given IV to delineate the left ventricular  endocardial borders. The left ventricular internal cavity size was normal in size. There is no left ventricular hypertrophy. The interventricular septum is flattened in systole, consistent with right ventricular pressure overload. Left ventricular diastolic parameters were normal. Right Ventricle: The right ventricular size is normal. No increase in right ventricular wall thickness. Right ventricular systolic function is normal. Tricuspid regurgitation signal is inadequate for assessing PA pressure. Left Atrium: Left atrial size was normal in size. Right Atrium: Right atrial size was normal in size. Pericardium: There is no evidence of pericardial effusion. Mitral Valve: The mitral valve is normal in structure. No evidence of mitral valve regurgitation. No evidence of mitral valve stenosis. Tricuspid Valve: The tricuspid valve is normal in structure. Tricuspid valve regurgitation is not demonstrated. No evidence of tricuspid stenosis. Aortic Valve: The aortic valve is tricuspid. Aortic valve regurgitation is not visualized. Aortic valve sclerosis is present, with no evidence of aortic valve stenosis. Pulmonic Valve: The pulmonic valve was normal in structure. Pulmonic valve regurgitation is not visualized. No evidence of pulmonic stenosis. Aorta: The aortic root is normal in size and structure. Venous: The inferior vena cava is normal in size with greater than 50% respiratory variability, suggesting  right atrial pressure of 3 mmHg. IAS/Shunts: No atrial level shunt detected by color flow Doppler.  LEFT VENTRICLE PLAX 2D LVIDd:         5.00 cm   Diastology LVIDs:         3.20 cm   LV e' medial:    6.96 cm/s LV PW:         1.20 cm   LV E/e'  medial:  8.6 LV IVS:        1.40 cm   LV e' lateral:   11.30 cm/s LVOT diam:     2.30 cm   LV E/e' lateral: 5.3 LV SV:         109 LV SV Index:   39 LVOT Area:     4.15 cm  RIGHT VENTRICLE             IVC RV S prime:     17.00 cm/s  IVC diam: 2.00 cm TAPSE (M-mode): 2.6 cm LEFT ATRIUM             Index LA diam:        4.70 cm 1.67 cm/m LA Vol (A2C):   58.5 ml 20.78 ml/m LA Vol (A4C):   69.6 ml 24.72 ml/m LA Biplane Vol: 66.7 ml 23.69 ml/m  AORTIC VALVE LVOT Vmax:   142.00 cm/s LVOT Vmean:  98.500 cm/s LVOT VTI:    0.262 m  AORTA Ao Root diam: 3.40 cm Ao Asc diam:  3.10 cm MITRAL VALVE MV Area (PHT): 4.49 cm    SHUNTS MV Decel Time: 169 msec    Systemic VTI:  0.26 m MV E velocity: 60.20 cm/s  Systemic Diam: 2.30 cm MV A velocity: 81.60 cm/s MV E/A ratio:  0.74 Morene Brownie Electronically signed by Morene Brownie Signature Date/Time: 01/04/2024/3:33:57 PM    Final    CT L-SPINE NO CHARGE Result Date: 01/02/2024 CLINICAL DATA:  Low back pain EXAM: CT Lumbar Spine with contrast TECHNIQUE: Technique: Multiplanar CT images of the lumbar spine were reconstructed from contemporary CT of the Abdomen and Pelvis. RADIATION DOSE REDUCTION: This exam was performed according to the departmental dose-optimization program which includes automated exposure control, adjustment of the mA and/or kV according to patient size and/or use of iterative reconstruction technique. CONTRAST:  No additional COMPARISON:  MRI from 08/05/2020 FINDINGS: Segmentation: 5 lumbar type vertebral bodies are well visualized. Alignment: Mild grade 1 anterolisthesis of L4 on L5 of a degenerative nature. This is stable in appearance from the prior MRI. Vertebrae: No compression deformities are noted. Anterior osteophytic change is noted extending from T12 to L5. Extensive facet hypertrophic changes are noted. Paraspinal and other soft tissues: Within normal limits. Disc levels: Osteophytic changes are noted at T12-L1 causing central impingement  upon the thecal sac. Mild disc bulging and facet hypertrophic changes with ligamentous hypertrophy is noted at L1-2 and to a greater degree at L2-3 with severe central canal stenosis at L2-3. Disc bulging and ligamentous hypertrophy is seen at L3-4 and L4-5 with central canal stenosis. This is accentuated at L4-5 by the anterolisthesis. Mild disc bulging is noted at L5-S1. IMPRESSION: Extensive degenerative changes throughout the lumbar spine which have increased in the interval from the MRI. No compression deformity is seen. Electronically Signed   By: Oneil Devonshire M.D.   On: 01/02/2024 21:49   CT Angio Chest PE W/Cm &/Or Wo Cm Addendum Date: 01/02/2024 ADDENDUM REPORT: 01/02/2024 17:11 ADDENDUM: Findings conveyed to nurse practitioner Abigail. Additional consideration would be lobar  pneumonia with superimposed pulmonary edema. Electronically Signed   By: Jackquline Boxer M.D.   On: 01/02/2024 17:11   Result Date: 01/02/2024 CLINICAL DATA:  Short of breath EXAM: CT ANGIOGRAPHY CHEST CT ABDOMEN AND PELVIS WITH CONTRAST TECHNIQUE: Multidetector CT imaging of the chest was performed using the standard protocol during bolus administration of intravenous contrast. Multiplanar CT image reconstructions and MIPs were obtained to evaluate the vascular anatomy. Multidetector CT imaging of the abdomen and pelvis was performed using the standard protocol during bolus administration of intravenous contrast. RADIATION DOSE REDUCTION: This exam was performed according to the departmental dose-optimization program which includes automated exposure control, adjustment of the mA and/or kV according to patient size and/or use of iterative reconstruction technique. CONTRAST:  OMNIPAQUE  IOHEXOL  350 MG/ML SOLN COMPARISON:  None Available. FINDINGS: CTA CHEST FINDINGS Cardiovascular: No filling defects within the pulmonary arteries to suggest acute pulmonary embolism. No significant vascular findings. Normal heart size. No  pericardial effusion. Coronary artery calcification and aortic atherosclerotic calcification. Mediastinum/Nodes: No axillary or supraclavicular adenopathy. No mediastinal or hilar adenopathy. No pericardial fluid. Esophagus normal. Lungs/Pleura: There is dense bilateral airspace disease. Confluent airspace disease in the RIGHT upper lobe with air bronchograms. Small RIGHT effusion. Musculoskeletal: No aggressive osseous lesion. Review of the MIP images confirms the above findings. CT ABDOMEN and PELVIS FINDINGS Hepatobiliary: No focal hepatic lesion. Normal gallbladder. No biliary duct dilatation. Common bile duct is normal. Pancreas: Pancreas is normal. No ductal dilatation. No pancreatic inflammation. Spleen: Normal spleen Adrenals/urinary tract: Adrenal glands normal. Bilateral small renal calculi. Ureters bladder normal. Stomach/Bowel: Stomach, small bowel, appendix, and cecum are normal. The colon and rectosigmoid colon are normal. Vascular/Lymphatic: Abdominal aorta is normal caliber. No periportal or retroperitoneal adenopathy. No pelvic adenopathy. Reproductive: Unremarkable Other: No free fluid. Musculoskeletal: No aggressive osseous lesion. Review of the MIP images confirms the above findings. IMPRESSION: CHEST: 1. No evidence acute pulmonary embolism. 2. Dense RIGHT upper lobe consolidation. Multifocal severe airspace disease within LEFT and RIGHT lung involving upper and lower lobes. Differential would include multifocal pneumonia versus atypical pneumonia versus in elation exposure or ARDS. 3. Small RIGHT effusion. PELVIS: 1. No acute findings in the abdomen pelvis. 2. Bilateral nonobstructing renal calculi. 3.  Aortic Atherosclerosis (ICD10-I70.0). Electronically Signed: By: Jackquline Boxer M.D. On: 01/02/2024 17:05   CT ABDOMEN PELVIS W CONTRAST Addendum Date: 01/02/2024 ADDENDUM REPORT: 01/02/2024 17:11 ADDENDUM: Findings conveyed to nurse practitioner Abigail. Additional consideration would be  lobar pneumonia with superimposed pulmonary edema. Electronically Signed   By: Jackquline Boxer M.D.   On: 01/02/2024 17:11   Result Date: 01/02/2024 CLINICAL DATA:  Short of breath EXAM: CT ANGIOGRAPHY CHEST CT ABDOMEN AND PELVIS WITH CONTRAST TECHNIQUE: Multidetector CT imaging of the chest was performed using the standard protocol during bolus administration of intravenous contrast. Multiplanar CT image reconstructions and MIPs were obtained to evaluate the vascular anatomy. Multidetector CT imaging of the abdomen and pelvis was performed using the standard protocol during bolus administration of intravenous contrast. RADIATION DOSE REDUCTION: This exam was performed according to the departmental dose-optimization program which includes automated exposure control, adjustment of the mA and/or kV according to patient size and/or use of iterative reconstruction technique. CONTRAST:  OMNIPAQUE  IOHEXOL  350 MG/ML SOLN COMPARISON:  None Available. FINDINGS: CTA CHEST FINDINGS Cardiovascular: No filling defects within the pulmonary arteries to suggest acute pulmonary embolism. No significant vascular findings. Normal heart size. No pericardial effusion. Coronary artery calcification and aortic atherosclerotic calcification. Mediastinum/Nodes: No axillary or supraclavicular adenopathy.  No mediastinal or hilar adenopathy. No pericardial fluid. Esophagus normal. Lungs/Pleura: There is dense bilateral airspace disease. Confluent airspace disease in the RIGHT upper lobe with air bronchograms. Small RIGHT effusion. Musculoskeletal: No aggressive osseous lesion. Review of the MIP images confirms the above findings. CT ABDOMEN and PELVIS FINDINGS Hepatobiliary: No focal hepatic lesion. Normal gallbladder. No biliary duct dilatation. Common bile duct is normal. Pancreas: Pancreas is normal. No ductal dilatation. No pancreatic inflammation. Spleen: Normal spleen Adrenals/urinary tract: Adrenal glands normal. Bilateral  small renal calculi. Ureters bladder normal. Stomach/Bowel: Stomach, small bowel, appendix, and cecum are normal. The colon and rectosigmoid colon are normal. Vascular/Lymphatic: Abdominal aorta is normal caliber. No periportal or retroperitoneal adenopathy. No pelvic adenopathy. Reproductive: Unremarkable Other: No free fluid. Musculoskeletal: No aggressive osseous lesion. Review of the MIP images confirms the above findings. IMPRESSION: CHEST: 1. No evidence acute pulmonary embolism. 2. Dense RIGHT upper lobe consolidation. Multifocal severe airspace disease within LEFT and RIGHT lung involving upper and lower lobes. Differential would include multifocal pneumonia versus atypical pneumonia versus in elation exposure or ARDS. 3. Small RIGHT effusion. PELVIS: 1. No acute findings in the abdomen pelvis. 2. Bilateral nonobstructing renal calculi. 3.  Aortic Atherosclerosis (ICD10-I70.0). Electronically Signed: By: Jackquline Boxer M.D. On: 01/02/2024 17:05   DG Chest 1 View Result Date: 01/02/2024 CLINICAL DATA:  Shortness of breath. EXAM: CHEST  1 VIEW COMPARISON:  09/28/2014 FINDINGS: Dense airspace consolidation in the right mid and upper lung zone. More diffusely nodular appearing airspace opacity in the left mid and lower lung zones. True lung nodules can not be excluded. No pleural fluid. Interval mildly enlarged cardiac silhouette. Moderate thoracic spine degenerative changes. IMPRESSION: 1. Dense airspace consolidation in the right mid and upper lung zone and more diffusely nodular appearing airspace opacity in the left mid and lower lung zones. Findings are most consistent with multifocal pneumonia. Followup PA and lateral chest X-ray is recommended in 3-4 weeks following trial of antibiotic therapy to ensure resolution and exclude underlying malignancy. 2. Interval mild cardiomegaly. Electronically Signed   By: Elspeth Bathe M.D.   On: 01/02/2024 14:54      Subjective: Patient seen and examined at  bedside.  Felt slightly dizzy this morning but feels okay to go home today.  No fever, vomiting, chest pain reported.  Still having intermittent back pain.  Discharge Exam: Vitals:   01/07/24 0437 01/07/24 0815  BP: 122/69 (!) 111/50  Pulse:  78  Resp: 18 18  Temp: 97.8 F (36.6 C) 97.6 F (36.4 C)  SpO2: 94% 95%    General: Pt is alert, awake, not in acute distress.  On room air. Cardiovascular: rate controlled, S1/S2 + Respiratory: bilateral decreased breath sounds at bases with scattered crackles Abdominal: Soft, morbidly obese, NT, ND, bowel sounds + Extremities: Trace lower extremity edema; no cyanosis    The results of significant diagnostics from this hospitalization (including imaging, microbiology, ancillary and laboratory) are listed below for reference.     Microbiology: Recent Results (from the past 240 hours)  Resp panel by RT-PCR (RSV, Flu A&B, Covid) Anterior Nasal Swab     Status: None   Collection Time: 01/02/24  1:55 PM   Specimen: Anterior Nasal Swab  Result Value Ref Range Status   SARS Coronavirus 2 by RT PCR NEGATIVE NEGATIVE Final   Influenza A by PCR NEGATIVE NEGATIVE Final   Influenza B by PCR NEGATIVE NEGATIVE Final    Comment: (NOTE) The Xpert Xpress SARS-CoV-2/FLU/RSV plus assay is intended as  an aid in the diagnosis of influenza from Nasopharyngeal swab specimens and should not be used as a sole basis for treatment. Nasal washings and aspirates are unacceptable for Xpert Xpress SARS-CoV-2/FLU/RSV testing.  Fact Sheet for Patients: BloggerCourse.com  Fact Sheet for Healthcare Providers: SeriousBroker.it  This test is not yet approved or cleared by the [place]  FDA and has been authorized for detection and/or diagnosis of SARS-CoV-2 by FDA under an Emergency Use Authorization (EUA). This EUA will remain in effect (meaning this test can be used) for the duration of the COVID-19  declaration under Section 564(b)(1) of the Act, 21 U.S.C. section 360bbb-3(b)(1), unless the authorization is terminated or revoked.     Resp Syncytial Virus by PCR NEGATIVE NEGATIVE Final    Comment: (NOTE) Fact Sheet for Patients: BloggerCourse.com  Fact Sheet for Healthcare Providers: SeriousBroker.it  This test is not yet approved or cleared by the [place]  FDA and has been authorized for detection and/or diagnosis of SARS-CoV-2 by FDA under an Emergency Use Authorization (EUA). This EUA will remain in effect (meaning this test can be used) for the duration of the COVID-19 declaration under Section 564(b)(1) of the Act, 21 U.S.C. section 360bbb-3(b)(1), unless the authorization is terminated or revoked.  Performed at [facility], [address] [place], [place] [address]   Culture, blood (Routine x 2)     Status: None   Collection Time: 01/02/24  1:57 PM   Specimen: BLOOD RIGHT HAND  Result Value Ref Range Status   Specimen Description BLOOD RIGHT HAND  Final   Special Requests   Final    BOTTLES DRAWN AEROBIC AND ANAEROBIC Blood Culture adequate volume   Culture   Final    NO GROWTH 5 DAYS Performed at [facility], [address] [place], [place] [address]    Report Status 01/07/2024 FINAL  Final  Culture, blood (Routine x 2)     Status: None   Collection Time: 01/02/24  2:02 PM   Specimen: BLOOD RIGHT HAND  Result Value Ref Range Status   Specimen Description BLOOD RIGHT HAND  Final   Special Requests   Final    BOTTLES DRAWN AEROBIC AND ANAEROBIC Blood Culture results may not be optimal due to an inadequate volume of blood received in culture bottles   Culture   Final    NO GROWTH 5 DAYS Performed at [facility], [address], [place], [place] [address]    Report Status 01/07/2024 FINAL  Final  Urine Culture     Status: Abnormal   Collection Time: 01/02/24  2:05 PM   Specimen:  Urine, Random  Result Value Ref Range Status   Specimen Description URINE, RANDOM  Final   Special Requests NONE Reflexed from [id number]  Final   Culture (A)  Final    >=100,000 COLONIES/mL ESCHERICHIA COLI Two isolates with different morphologies were identified as the same organism.The most resistant organism was reported. Performed at [facility], [address] [place], [place] [address]    Report Status 01/05/2024 FINAL  Final   Organism ID, Bacteria ESCHERICHIA COLI (A)  Final      Susceptibility   Escherichia coli - MIC*    AMPICILLIN >=32 RESISTANT Resistant     CEFAZOLIN <=4 SENSITIVE Sensitive     CEFEPIME <=0.12 SENSITIVE Sensitive     CEFTRIAXONE  <=0.25 SENSITIVE Sensitive     CIPROFLOXACIN  >=4 RESISTANT Resistant     GENTAMICIN <=1 SENSITIVE Sensitive  IMIPENEM <=0.25 SENSITIVE Sensitive     NITROFURANTOIN <=16 SENSITIVE Sensitive     TRIMETH/SULFA >=320 RESISTANT Resistant     AMPICILLIN/SULBACTAM 16 INTERMEDIATE Intermediate     PIP/TAZO <=4 SENSITIVE Sensitive ug/mL    * >=100,000 COLONIES/mL ESCHERICHIA COLI  Respiratory (~20 pathogens) panel by PCR     Status: None   Collection Time: 01/02/24  5:30 PM   Specimen: Nasopharyngeal Swab; Respiratory  Result Value Ref Range Status   Adenovirus NOT DETECTED NOT DETECTED Final   Coronavirus 229E NOT DETECTED NOT DETECTED Final    Comment: (NOTE) The Coronavirus on the Respiratory Panel, DOES NOT test for the novel  Coronavirus (2019 nCoV)    Coronavirus HKU1 NOT DETECTED NOT DETECTED Final   Coronavirus NL63 NOT DETECTED NOT DETECTED Final   Coronavirus OC43 NOT DETECTED NOT DETECTED Final   Metapneumovirus NOT DETECTED NOT DETECTED Final   Rhinovirus / Enterovirus NOT DETECTED NOT DETECTED Final   Influenza A NOT DETECTED NOT DETECTED Final   Influenza B NOT DETECTED NOT DETECTED Final   Parainfluenza Virus 1 NOT DETECTED NOT DETECTED Final   Parainfluenza Virus 2 NOT DETECTED NOT DETECTED Final    Parainfluenza Virus 3 NOT DETECTED NOT DETECTED Final   Parainfluenza Virus 4 NOT DETECTED NOT DETECTED Final   Respiratory Syncytial Virus NOT DETECTED NOT DETECTED Final   Bordetella pertussis NOT DETECTED NOT DETECTED Final   Bordetella Parapertussis NOT DETECTED NOT DETECTED Final   Chlamydophila pneumoniae NOT DETECTED NOT DETECTED Final   Mycoplasma pneumoniae NOT DETECTED NOT DETECTED Final    Comment: Performed at [facility], [address] [place], [place] [address]  SARS Coronavirus 2 by RT PCR (hospital order, performed in [facility] hospital lab) *cepheid single result test* Anterior Nasal Swab     Status: None   Collection Time: 01/02/24  5:30 PM   Specimen: Anterior Nasal Swab  Result Value Ref Range Status   SARS Coronavirus 2 by RT PCR NEGATIVE NEGATIVE Final    Comment: Performed at [facility], [address] [place], [place] [address]  MRSA Next Gen by PCR, Nasal     Status: Abnormal   Collection Time: 01/02/24  5:30 PM   Specimen: Nasal Mucosa; Nasal Swab  Result Value Ref Range Status   MRSA by PCR Next Gen DETECTED (A) NOT DETECTED Final    Comment: RESULT CALLED TO, READ BACK BY AND VERIFIED WITH: RN VINA DEL 2106 [phone] FCP (NOTE) The GeneXpert MRSA Assay (FDA approved for NASAL specimens only), is one component of a comprehensive MRSA colonization surveillance program. It is not intended to diagnose MRSA infection nor to guide or monitor treatment for MRSA infections. Test performance is not FDA approved in patients less than 5 years old. Performed at [facility], [address] [place], [place] [address]   Expectorated Sputum Assessment w Gram Stain, Rflx to Resp Cult     Status: None   Collection Time: 01/02/24  6:21 PM   Specimen: Sputum  Result Value Ref Range Status   Specimen Description SPUTUM  Final   Special Requests NONE  Final   Sputum evaluation   Final    Sputum specimen not acceptable for testing.  Please recollect.    NOTIFIED RN VELMA MANILA ON 01/04/24 @ 1546 BY DRT Performed at [facility], [address], [place], [place] [address]    Report Status 01/04/2024 FINAL  Final     Labs: BNP (last  3 results) Recent Labs    01/02/24 1959  BNP 347.3*   Basic Metabolic Panel: Recent Labs  Lab 01/03/24 0307 01/04/24 0435 01/05/24 0223 01/06/24 0228 01/07/24 0248  NA 136 137 137 136 135  K 4.2 4.3 4.1 3.7 4.0  CL 108 108 105 100 100  CO2 20* 21* 23 27 28   GLUCOSE 259* 221* 147* 104* 105*  BUN 25* 32* 30* 28* 26*  CREATININE 0.91 0.98 0.91 1.00 0.94  CALCIUM  8.3* 8.2* 8.2* 8.4* 8.4*  MG 2.5*  --   --   --   --   PHOS 3.1  --   --   --   --    Liver Function Tests: Recent Labs  Lab 01/02/24 1355  AST 40  ALT 35  ALKPHOS 68  BILITOT 0.9  PROT 6.6  ALBUMIN 2.5*   No results for input(s): LIPASE, AMYLASE in the last 168 hours. No results for input(s): AMMONIA in the last 168 hours. CBC: Recent Labs  Lab 01/02/24 1355 01/02/24 1545 01/03/24 0307 01/04/24 0435 01/05/24 0223 01/06/24 0228 01/07/24 0248  WBC 12.9*  --  9.0 9.1 8.9 10.3 11.3*  NEUTROABS 11.3*  --   --   --  7.0 5.8  --   HGB 14.2   < > 13.6 12.9* 13.0 13.4 14.2  HCT 41.4   < > 40.1 39.0 39.0 40.2 42.0  MCV 91.6  --  93.9 94.4 93.3 93.9 92.1  PLT 125*  --  112* 125* 145* 159 188   < > = values in this interval not displayed.   Cardiac Enzymes: No results for input(s): CKTOTAL, CKMB, CKMBINDEX, TROPONINI in the last 168 hours. BNP: Invalid input(s): POCBNP CBG: Recent Labs  Lab 01/06/24 1550 01/06/24 2032 01/06/24 2315 01/07/24 0434 01/07/24 0814  GLUCAP 142* 144* 112* 121* 142*   D-Dimer No results for input(s): DDIMER in the last 72 hours. Hgb A1c No results for input(s): HGBA1C in the last 72 hours. Lipid Profile No results for input(s): CHOL, HDL, LDLCALC, TRIG, CHOLHDL, LDLDIRECT in the last 72 hours. Thyroid function studies No results for input(s):  TSH, T4TOTAL, T3FREE, THYROIDAB in the last 72 hours.  Invalid input(s): FREET3 Anemia work up No results for input(s): VITAMINB12, FOLATE, FERRITIN, TIBC, IRON, RETICCTPCT in the last 72 hours. Urinalysis    Component Value Date/Time   COLORURINE AMBER (A) 01/02/2024 1405   APPEARANCEUR CLOUDY (A) 01/02/2024 1405   APPEARANCEUR CANCELED 12/11/2023 1004   LABSPEC 1.025 01/02/2024 1405   LABSPEC 1.015 12/11/2023 0958   PHURINE 5.0 01/02/2024 1405   GLUCOSEU NEGATIVE 01/02/2024 1405   HGBUR LARGE (A) 01/02/2024 1405   BILIRUBINUR NEGATIVE 01/02/2024 1405   BILIRUBINUR CANCELED 12/11/2023 1004   BILIRUBINUR negative 12/11/2023 0958   KETONESUR NEGATIVE 01/02/2024 1405   PROTEINUR 100 (A) 01/02/2024 1405   UROBILINOGEN negative (A) 10/28/2016 1044   NITRITE NEGATIVE 01/02/2024 1405   LEUKOCYTESUR LARGE (A) 01/02/2024 1405   Sepsis Labs Recent Labs  Lab 01/04/24 0435 01/05/24 0223 01/06/24 0228 01/07/24 0248  WBC 9.1 8.9 10.3 11.3*   Microbiology Recent Results (from the past 240 hours)  Resp panel by RT-PCR (RSV, Flu A&B, Covid) Anterior Nasal Swab     Status: None   Collection Time: 01/02/24  1:55 PM   Specimen: Anterior Nasal Swab  Result Value Ref Range Status   SARS Coronavirus 2 by RT PCR NEGATIVE NEGATIVE Final   Influenza A by PCR NEGATIVE NEGATIVE Final   Influenza B  by PCR NEGATIVE NEGATIVE Final    Comment: (NOTE) The Xpert Xpress SARS-CoV-2/FLU/RSV plus assay is intended as an aid in the diagnosis of influenza from Nasopharyngeal swab specimens and should not be used as a sole basis for treatment. Nasal washings and aspirates are unacceptable for Xpert Xpress SARS-CoV-2/FLU/RSV testing.  Fact Sheet for Patients: BloggerCourse.com  Fact Sheet for Healthcare Providers: SeriousBroker.it  This test is not yet approved or cleared by the [place]  FDA and has been authorized for  detection and/or diagnosis of SARS-CoV-2 by FDA under an Emergency Use Authorization (EUA). This EUA will remain in effect (meaning this test can be used) for the duration of the COVID-19 declaration under Section 564(b)(1) of the Act, 21 U.S.C. section 360bbb-3(b)(1), unless the authorization is terminated or revoked.     Resp Syncytial Virus by PCR NEGATIVE NEGATIVE Final    Comment: (NOTE) Fact Sheet for Patients: BloggerCourse.com  Fact Sheet for Healthcare Providers: SeriousBroker.it  This test is not yet approved or cleared by the [place]  FDA and has been authorized for detection and/or diagnosis of SARS-CoV-2 by FDA under an Emergency Use Authorization (EUA). This EUA will remain in effect (meaning this test can be used) for the duration of the COVID-19 declaration under Section 564(b)(1) of the Act, 21 U.S.C. section 360bbb-3(b)(1), unless the authorization is terminated or revoked.  Performed at [facility], [address] [place], [place] [address]   Culture, blood (Routine x 2)     Status: None   Collection Time: 01/02/24  1:57 PM   Specimen: BLOOD RIGHT HAND  Result Value Ref Range Status   Specimen Description BLOOD RIGHT HAND  Final   Special Requests   Final    BOTTLES DRAWN AEROBIC AND ANAEROBIC Blood Culture adequate volume   Culture   Final    NO GROWTH 5 DAYS Performed at [facility], [address] [place], [place] [address]    Report Status 01/07/2024 FINAL  Final  Culture, blood (Routine x 2)     Status: None   Collection Time: 01/02/24  2:02 PM   Specimen: BLOOD RIGHT HAND  Result Value Ref Range Status   Specimen Description BLOOD RIGHT HAND  Final   Special Requests   Final    BOTTLES DRAWN AEROBIC AND ANAEROBIC Blood Culture results may not be optimal due to an inadequate volume of blood received in culture bottles   Culture   Final    NO GROWTH 5 DAYS Performed at [facility], [address] [place], [place] [address]    Report Status 01/07/2024 FINAL  Final  Urine Culture     Status: Abnormal   Collection Time: 01/02/24  2:05 PM   Specimen: Urine, Random  Result Value Ref Range Status   Specimen Description URINE, RANDOM  Final   Special Requests NONE Reflexed from [id number]  Final   Culture (A)  Final    >=100,000 COLONIES/mL ESCHERICHIA COLI Two isolates with different morphologies were identified as the same organism.The most resistant organism was reported. Performed at [facility], [address] [place], [place] [address]    Report Status 01/05/2024 FINAL  Final   Organism ID, Bacteria ESCHERICHIA COLI (A)  Final      Susceptibility   Escherichia coli - MIC*    AMPICILLIN >=32 RESISTANT Resistant     CEFAZOLIN <=4 SENSITIVE Sensitive     CEFEPIME <=0.12 SENSITIVE Sensitive     CEFTRIAXONE  <=0.25 SENSITIVE  Sensitive     CIPROFLOXACIN  >=4 RESISTANT Resistant     GENTAMICIN <=1 SENSITIVE Sensitive     IMIPENEM <=0.25 SENSITIVE Sensitive     NITROFURANTOIN <=16 SENSITIVE Sensitive     TRIMETH/SULFA >=320 RESISTANT Resistant     AMPICILLIN/SULBACTAM 16 INTERMEDIATE Intermediate     PIP/TAZO <=4 SENSITIVE Sensitive ug/mL    * >=100,000 COLONIES/mL ESCHERICHIA COLI  Respiratory (~20 pathogens) panel by PCR     Status: None   Collection Time: 01/02/24  5:30 PM   Specimen: Nasopharyngeal Swab; Respiratory  Result Value Ref Range Status   Adenovirus NOT DETECTED NOT DETECTED Final   Coronavirus 229E NOT DETECTED NOT DETECTED Final    Comment: (NOTE) The Coronavirus on the Respiratory Panel, DOES NOT test for the novel  Coronavirus (2019 nCoV)    Coronavirus HKU1 NOT DETECTED NOT DETECTED Final   Coronavirus NL63 NOT DETECTED NOT DETECTED Final   Coronavirus OC43 NOT DETECTED NOT DETECTED Final   Metapneumovirus NOT DETECTED NOT DETECTED Final   Rhinovirus / Enterovirus NOT DETECTED NOT DETECTED Final   Influenza A NOT DETECTED  NOT DETECTED Final   Influenza B NOT DETECTED NOT DETECTED Final   Parainfluenza Virus 1 NOT DETECTED NOT DETECTED Final   Parainfluenza Virus 2 NOT DETECTED NOT DETECTED Final   Parainfluenza Virus 3 NOT DETECTED NOT DETECTED Final   Parainfluenza Virus 4 NOT DETECTED NOT DETECTED Final   Respiratory Syncytial Virus NOT DETECTED NOT DETECTED Final   Bordetella pertussis NOT DETECTED NOT DETECTED Final   Bordetella Parapertussis NOT DETECTED NOT DETECTED Final   Chlamydophila pneumoniae NOT DETECTED NOT DETECTED Final   Mycoplasma pneumoniae NOT DETECTED NOT DETECTED Final    Comment: Performed at [facility], [address] [place], [place] [address]  SARS Coronavirus 2 by RT PCR (hospital order, performed in [facility] hospital lab) *cepheid single result test* Anterior Nasal Swab     Status: None   Collection Time: 01/02/24  5:30 PM   Specimen: Anterior Nasal Swab  Result Value Ref Range Status   SARS Coronavirus 2 by RT PCR NEGATIVE NEGATIVE Final    Comment: Performed at [facility], [address] [place], [place] [address]  MRSA Next Gen by PCR, Nasal     Status: Abnormal   Collection Time: 01/02/24  5:30 PM   Specimen: Nasal Mucosa; Nasal Swab  Result Value Ref Range Status   MRSA by PCR Next Gen DETECTED (A) NOT DETECTED Final    Comment: RESULT CALLED TO, READ BACK BY AND VERIFIED WITH: RN VINA DEL 2106 [phone] FCP (NOTE) The GeneXpert MRSA Assay (FDA approved for NASAL specimens only), is one component of a comprehensive MRSA colonization surveillance program. It is not intended to diagnose MRSA infection nor to guide or monitor treatment for MRSA infections. Test performance is not FDA approved in patients less than 89 years old. Performed at [facility], [address], [place], [place] [address]   Expectorated Sputum Assessment w Gram Stain, Rflx to Resp Cult     Status: None   Collection Time: 01/02/24  6:21 PM   Specimen: Sputum  Result Value  Ref Range Status   Specimen Description SPUTUM  Final   Special Requests NONE  Final   Sputum evaluation   Final    Sputum specimen not acceptable for testing.  Please recollect.   NOTIFIED RN VELMA MANILA ON 01/04/24 @ 1546 BY DRT Performed at [facility], [address] [place], [place] [address]    Report Status 01/04/2024 FINAL  Final     Time coordinating discharge: 35 minutes  SIGNED:   Sophie Mao, MD  [facility] 01/07/2024, 11:49 AM

## 2024-01-07 NOTE — TOC Transition Note (Signed)
 Transition of Care [facility]) - Discharge Note   Patient Details  Name: Patrick Baxter MRN: [id number] Date of Birth: 1970-07-21  [facility]) CM/SW Contact:  Waddell Barnie Rama, RN Phone Number: 01/07/2024, 12:43 PM   Clinical Narrative:    For dc today, per saturations he does not need home oxygen,  he has transport home. The walker is in the room.           Patient Goals and CMS Choice            Discharge Placement                       Discharge Plan and Services Additional resources added to the After Visit Summary for                                       Social Drivers of Health (SDOH) Interventions SDOH Screenings   Food Insecurity: No Food Insecurity (01/05/2024)  Housing: Low Risk  (01/05/2024)  Transportation Needs: No Transportation Needs (01/05/2024)  Utilities: Not At Risk (01/05/2024)  Alcohol Screen: Low Risk  (01/01/2024)  Depression (PHQ2-9): Medium Risk (12/11/2023)  Financial Resource Strain: Low Risk  (01/01/2024)  Physical Activity: Insufficiently Active (01/01/2024)  Social Connections: Socially Isolated (01/01/2024)  Stress: Stress Concern Present (01/01/2024)  Tobacco Use: Medium Risk (01/02/2024)     Readmission Risk Interventions    01/06/2024   11:48 AM  Readmission Risk Prevention Plan  Transportation Screening Complete  PCP or Specialist Appt within 5-7 Days Complete  Home Care Screening Complete  Medication Review (RN CM) Complete

## 2024-01-07 NOTE — Progress Notes (Signed)
 DISCHARGE NOTE HOME Patrick Baxter to be discharged Home per MD order. Discussed prescriptions and follow up appointments with the patient. Prescriptions given to patient; medication list explained in detail. Patient verbalized understanding.  Skin clean, dry and intact without evidence of skin break down, no evidence of skin tears noted. IV catheter discontinued intact. Site without signs and symptoms of complications. Dressing and pressure applied. Pt denies pain at the site currently. No complaints noted.  Patient free of lines, drains, and wounds.   An After Visit Summary (AVS) was printed and given to the patient. Patient escorted via wheelchair, and discharged home via private auto.  [name], RN

## 2024-01-08 ENCOUNTER — Telehealth: Payer: Self-pay

## 2024-01-08 ENCOUNTER — Ambulatory Visit: Admitting: Nurse Practitioner

## 2024-01-08 NOTE — Transitions of Care (Post Inpatient/ED Visit) (Signed)
   01/08/2024  Name: Patrick Baxter MRN: [id number] DOB: Jul 15, 1970  Today's TOC FU Call Status: Today's TOC FU Call Status:: Unsuccessful Call (1st Attempt) Unsuccessful Call (1st Attempt) Date: 01/08/24  Attempted to reach the patient regarding the most recent Inpatient/ED visit.  Follow Up Plan: Additional outreach attempts will be made to reach the patient to complete the Transitions of Care (Post Inpatient/ED visit) call.    Bing Edison MSN, RN RN Case Sales executive Health  [facility] Office Hours M-F [phone] Direct Dial: [phone] Main Phone [phone]  Fax: [phone] [email or web address]

## 2024-01-11 ENCOUNTER — Telehealth: Payer: Self-pay

## 2024-01-11 DIAGNOSIS — M545 Low back pain, unspecified: Secondary | ICD-10-CM | POA: Diagnosis not present

## 2024-01-11 DIAGNOSIS — M4696 Unspecified inflammatory spondylopathy, lumbar region: Secondary | ICD-10-CM | POA: Diagnosis not present

## 2024-01-11 NOTE — Transitions of Care (Post Inpatient/ED Visit) (Signed)
   01/11/2024  Name: Patrick Baxter MRN: [id number] DOB: Apr 09, 1971  Today's TOC FU Call Status: Today's TOC FU Call Status:: Unsuccessful Call (2nd Attempt) Unsuccessful Call (2nd Attempt) Date: 01/11/24  Attempted to reach the patient regarding the most recent Inpatient/ED visit.  Follow Up Plan: Additional outreach attempts will be made to reach the patient to complete the Transitions of Care (Post Inpatient/ED visit) call.    Bing Edison MSN, RN RN Case Sales executive Health  [facility] Office Hours M-F [phone] Direct Dial: [phone] Main Phone [phone]  Fax: [phone] [email or web address]

## 2024-01-12 ENCOUNTER — Telehealth: Payer: Self-pay

## 2024-01-12 NOTE — Transitions of Care (Post Inpatient/ED Visit) (Signed)
   01/12/2024  Name: Patrick Baxter MRN: [id number] DOB: 1971-05-16  Today's TOC FU Call Status: Today's TOC FU Call Status:: Unsuccessful Call (3rd Attempt) Unsuccessful Call (3rd Attempt) Date: 01/12/24  Attempted to reach the patient regarding the most recent Inpatient/ED visit.  Follow Up Plan: No further outreach attempts will be made at this time. We have been unable to contact the patient.   Bing Edison MSN, RN RN Case Sales executive Health  [facility] Office Hours M-F [phone] Direct Dial: [phone] Main Phone [phone]  Fax: [phone] [email or web address]

## 2024-01-13 NOTE — Telephone Encounter (Signed)
 Please advise, Dr. Alva, if PT can be seen by APP or another provider.

## 2024-01-13 NOTE — Telephone Encounter (Signed)
 LM for PT. Have appt 7/30 w/Nurse Hope but will have to ask manager for an overide.

## 2024-01-14 ENCOUNTER — Telehealth: Payer: Self-pay | Admitting: Internal Medicine

## 2024-01-14 DIAGNOSIS — M4316 Spondylolisthesis, lumbar region: Secondary | ICD-10-CM | POA: Diagnosis not present

## 2024-01-14 DIAGNOSIS — F419 Anxiety disorder, unspecified: Secondary | ICD-10-CM | POA: Diagnosis not present

## 2024-01-14 DIAGNOSIS — K219 Gastro-esophageal reflux disease without esophagitis: Secondary | ICD-10-CM | POA: Diagnosis not present

## 2024-01-14 DIAGNOSIS — M1A9XX0 Chronic gout, unspecified, without tophus (tophi): Secondary | ICD-10-CM | POA: Diagnosis not present

## 2024-01-14 DIAGNOSIS — J9601 Acute respiratory failure with hypoxia: Secondary | ICD-10-CM | POA: Diagnosis not present

## 2024-01-14 DIAGNOSIS — J188 Other pneumonia, unspecified organism: Secondary | ICD-10-CM | POA: Diagnosis not present

## 2024-01-14 DIAGNOSIS — Z6841 Body Mass Index (BMI) 40.0 and over, adult: Secondary | ICD-10-CM | POA: Diagnosis not present

## 2024-01-14 DIAGNOSIS — F32A Depression, unspecified: Secondary | ICD-10-CM | POA: Diagnosis not present

## 2024-01-14 DIAGNOSIS — I10 Essential (primary) hypertension: Secondary | ICD-10-CM | POA: Diagnosis not present

## 2024-01-14 DIAGNOSIS — M25561 Pain in right knee: Secondary | ICD-10-CM | POA: Diagnosis not present

## 2024-01-14 DIAGNOSIS — E559 Vitamin D deficiency, unspecified: Secondary | ICD-10-CM | POA: Diagnosis not present

## 2024-01-14 DIAGNOSIS — N390 Urinary tract infection, site not specified: Secondary | ICD-10-CM | POA: Diagnosis not present

## 2024-01-14 DIAGNOSIS — G4733 Obstructive sleep apnea (adult) (pediatric): Secondary | ICD-10-CM | POA: Diagnosis not present

## 2024-01-14 DIAGNOSIS — J309 Allergic rhinitis, unspecified: Secondary | ICD-10-CM | POA: Diagnosis not present

## 2024-01-14 DIAGNOSIS — E039 Hypothyroidism, unspecified: Secondary | ICD-10-CM | POA: Diagnosis not present

## 2024-01-14 DIAGNOSIS — G8929 Other chronic pain: Secondary | ICD-10-CM | POA: Diagnosis not present

## 2024-01-14 DIAGNOSIS — E538 Deficiency of other specified B group vitamins: Secondary | ICD-10-CM | POA: Diagnosis not present

## 2024-01-14 DIAGNOSIS — E119 Type 2 diabetes mellitus without complications: Secondary | ICD-10-CM | POA: Diagnosis not present

## 2024-01-14 DIAGNOSIS — M5136 Other intervertebral disc degeneration, lumbar region with discogenic back pain only: Secondary | ICD-10-CM | POA: Diagnosis not present

## 2024-01-14 DIAGNOSIS — B9620 Unspecified Escherichia coli [E. coli] as the cause of diseases classified elsewhere: Secondary | ICD-10-CM | POA: Diagnosis not present

## 2024-01-14 DIAGNOSIS — G5603 Carpal tunnel syndrome, bilateral upper limbs: Secondary | ICD-10-CM | POA: Diagnosis not present

## 2024-01-14 DIAGNOSIS — E785 Hyperlipidemia, unspecified: Secondary | ICD-10-CM | POA: Diagnosis not present

## 2024-01-14 DIAGNOSIS — M25562 Pain in left knee: Secondary | ICD-10-CM | POA: Diagnosis not present

## 2024-01-14 DIAGNOSIS — M47816 Spondylosis without myelopathy or radiculopathy, lumbar region: Secondary | ICD-10-CM | POA: Diagnosis not present

## 2024-01-14 NOTE — Telephone Encounter (Signed)
 Leonor can you review and advise? Thanks.,

## 2024-01-14 NOTE — Telephone Encounter (Signed)
 Copied from CRM [id number]. Topic: Clinical - Home Health Verbal Orders >> Jan 14, 2024 12:23 PM Antwanette L wrote: Caller/Agency: Comer from [facility] Callback Number: [phone] Service Requested: Physical Therapy Frequency: 2x a week for the 1 week and 3x a week for the second week  Any new concerns about the patient? No. Comer is requesting a weekend visit  with patient >> Jan 14, 2024 12:27 PM Antwanette L wrote: 2x a week for the first week and 3x a week for the second week

## 2024-01-14 NOTE — Telephone Encounter (Signed)
 Called and left voicemail on katherine phone for PT orders

## 2024-01-18 ENCOUNTER — Ambulatory Visit (INDEPENDENT_AMBULATORY_CARE_PROVIDER_SITE_OTHER): Payer: Self-pay | Admitting: Medical

## 2024-01-18 VITALS — BP 124/72 | HR 85 | Temp 97.9°F | Ht 68.0 in | Wt >= 6400 oz

## 2024-01-18 DIAGNOSIS — R5383 Other fatigue: Secondary | ICD-10-CM | POA: Insufficient documentation

## 2024-01-18 DIAGNOSIS — R531 Weakness: Secondary | ICD-10-CM

## 2024-01-18 DIAGNOSIS — J9601 Acute respiratory failure with hypoxia: Secondary | ICD-10-CM | POA: Diagnosis not present

## 2024-01-18 DIAGNOSIS — J189 Pneumonia, unspecified organism: Secondary | ICD-10-CM | POA: Diagnosis not present

## 2024-01-18 MED ORDER — MUPIROCIN 2 % EX OINT: 1.0000 | TOPICAL_OINTMENT | Freq: Three times a day (TID) | CUTANEOUS | 0 refills | Status: DC

## 2024-01-18 NOTE — Progress Notes (Signed)
 Subjective: Chief Complaint  Patient presents with   Hospitalization Follow-up    Hospital follow-up from double pneumonia and back pain, still having some SOB, has appt with Pulmonology with 7/30. Job is not ADA and has steps at work, wants to know when he needs to return to work   Here for hospital follow up   Admit date: 01/02/2024 Discharge date: 01/07/2024  Discharge diagnoses: Primary: Acute respiratory failure with hypoxia, multifocal pneumonia  Secondary: E coli UTI Elevated troponin Diabetes type 2 HTN Hypothyroidism Back pain Gout Allergic rhinitis Rash Severe OSA Morbi obesity   Brief/Interim Summary: 48/M with a history of obesity, DM, HTN, OSA on CPAP who presented for worsening back pain and some shortness of breath.  On presentation, EMS found him saturating at 68% on room air; he was placed on nonrebreather.  In the ED, patient was placed on heated high flow nasal cannula.  He was admitted by PCCM and started on IV antibiotics and steroids for multifocal pneumonia.  He was subsequently weaned off to 6 L of nasal cannula and transferred to [facility] service from 01/04/2024 onwards.  He has then been transitioned to oral antibiotics and prednisone .  He is currently on room air.  He feels much better and feels comfortable home today.  He will be discharged home today with close outpatient follow-up with PCP.  Feeling overall better, but still weak.   Needs work accommodations.  Doesn't feel read to go back to work walking a lot.   Needs to rest, limit walking.    Using walker currently.  Not on oxygen.   Would like to use handicap space at work temporarily.  There are a lot of hills and steps at work.    From hospital, home health is coming out and recommends some short term PT and OT given ongoing back pain, weakness.   No other aggravating or relieving factors. No other complaint.   Past Medical History:  Diagnosis Date   Allergy    Anxiety    Back pain     Bilateral chronic knee pain    Chronic headaches    Depression    Dermatographism    Dry skin    Fatigue    Frequent urination    Gout    Hx of echocardiogram 09/07/2019   normal LV function, EF 60-65%, no wall motion abnormality; Dr. Michele Richardson   Hyperlipidemia    Hypertension    Joint pain    Knee pain    Obesity    Pre-diabetes    Sleep apnea    Stress    Wears glasses    Current Outpatient Medications on File Prior to Visit  Medication Sig Dispense Refill   albuterol  (VENTOLIN  HFA) 108 (90 Base) MCG/ACT inhaler Inhale 2 puffs into the lungs every 6 (six) hours as needed for wheezing or shortness of breath. 8 g 2   allopurinol  (ZYLOPRIM ) 300 MG tablet TAKE 1 TABLET(300 MG) BY MOUTH DAILY 90 tablet 0   B Complex-C (PROBEC-T PO) Take 1 tablet by mouth daily.     Calcium -Magnesium-Zinc 167-83-8 MG TABS Take 1 tablet by mouth daily.     cetirizine (ZYRTEC) 10 MG tablet Take 10 mg by mouth daily.     cholecalciferol (VITAMIN D3) 25 MCG (1000 UNIT) tablet Take 1 tablet (1,000 Units total) by mouth daily. 90 tablet 3   Coenzyme Q10 (CO Q 10 PO) Take 1 tablet by mouth daily at 6 (six) AM.     colchicine   0.6 MG tablet Take 1 tablet (0.6 mg total) by mouth daily as needed (for gout).     cyclobenzaprine  (FLEXERIL ) 10 MG tablet Take 1 tablet (10 mg total) by mouth 3 (three) times daily as needed for muscle spasms. 30 tablet 3   FLUoxetine  (PROZAC ) 40 MG capsule TAKE 1 CAPSULE(40 MG) BY MOUTH DAILY 90 capsule 0   guaiFENesin (MUCINEX PO) Take 1 tablet by mouth every 4 (four) hours as needed (for congestion).     levothyroxine  (SYNTHROID ) 25 MCG tablet TAKE 1 TABLET BY MOUTH EVERY DAY BEFORE BREAKFAST 90 tablet 3   meclizine  (ANTIVERT ) 25 MG tablet Take 1 tablet (25 mg total) by mouth 2 (two) times daily as needed for dizziness.     metoprolol  succinate (TOPROL -XL) 50 MG 24 hr tablet TAKE 1 TABLET BY MOUTH IMMEDIATLEY WITH OR IMMEDIATELY FOLLOWING A MEAL 30 tablet 5   montelukast   (SINGULAIR ) 10 MG tablet TAKE 1 TABLET(10 MG) BY MOUTH AT BEDTIME 90 tablet 3   Multiple Vitamins-Minerals (MULTIVITAMIN WITH MINERALS) tablet Take 1 tablet by mouth daily. 90 tablet 3   naproxen  (NAPROSYN ) 250 MG tablet Take 250 mg by mouth 3 (three) times daily as needed for mild pain (pain score 1-3).     nitroGLYCERIN  (NITROSTAT ) 0.4 MG SL tablet Place 1 tablet (0.4 mg total) under the tongue every 5 (five) minutes as needed for chest pain. 30 tablet 1   nystatin  (MYCOSTATIN /NYSTOP ) powder Apply 1 Application topically 2 (two) times daily. 30 g 1   Omega-3 Fatty Acids (SALMON OIL-1000) 200 MG CAPS Take 1 capsule by mouth daily.     phenylephrine (SUDAFED PE) 10 MG TABS tablet Take 10 mg by mouth every 4 (four) hours as needed.     predniSONE  (DELTASONE ) 10 MG tablet 40mg  daily for 1 day, then 30 milligram daily for 3 days then 20 mg daily for 3 days then 10 mg daily for 3 days then 5 mg daily for 3 days then stop. 24 tablet 0   Semaglutide  (RYBELSUS ) 7 MG TABS Take 1 tablet by mouth daily.     sodium chloride  (OCEAN) 0.65 % SOLN nasal spray Place 1 spray into both nostrils as needed for congestion.     clotrimazole -betamethasone  (LOTRISONE ) cream Apply 1 Application topically daily. 30 g 0   polyethylene glycol (MIRALAX  / GLYCOLAX ) 17 g packet Take 17 g by mouth daily as needed for moderate constipation. (Patient not taking: Reported on 01/18/2024) 14 each 0   No current facility-administered medications on file prior to visit.   ROS as in subjective    Objective: BP 124/72   Pulse 85   Temp 97.9 F (36.6 C)   Ht 5' 8 (1.727 m)   Wt (!) 415 lb (188.2 kg)   SpO2 98%   BMI 63.10 kg/m   Wt Readings from Last 3 Encounters:  01/18/24 (!) 415 lb (188.2 kg)  01/07/24 (!) 405 lb 3.3 oz (183.8 kg)  01/01/24 (!) 420 lb (190.5 kg)    Gen: seated with walker, NAD Lungs seem clear today, no dullness, no rhonchi, no wheezes Heart rrr, normal s1, s2 No significant given  edema   Assessment: Encounter Diagnoses  Name Primary?   Acute respiratory failure with hypoxia (HCC) Yes   Multifocal pneumonia    Fatigue, unspecified type    Weakness      Plan: I reviewed his recent hospitalization records, discharge summary, labs, imaging.  CT chest imaging showed dense right upper lobe consolidation, multifocal severe airspace  disease involving both lungs, differential including multifocal pneumonia versus atypical pneumonia versus ARDS.    Home health nursing, PT and OT coming out this week.  He is significantly improved but still fairly weak.  Work note given today including restrictions.  Will have him go part-time to work the next 2 weeks and then recheck.  He can use a walker and advised handicap parking for the next 2 weeks.  Apparently he already has a handicap placard  Activity as tolerated  He has already completed antibiotics and lungs are clear today.  He will use another week of mupirocin  intranasal due to recent MRSA culture positive  Continue other routine medicines as usual  Labs below per hospital discharge records recommendation  Langford [name] was seen today for hospitalization follow-up.  Diagnoses and all orders for this visit:  Acute respiratory failure with hypoxia (HCC) -     Comprehensive metabolic panel with GFR -     CBC with Differential/Platelet  Multifocal pneumonia -     Comprehensive metabolic panel with GFR -     CBC with Differential/Platelet  Fatigue, unspecified type -     Comprehensive metabolic panel with GFR -     CBC with Differential/Platelet  Weakness -     Comprehensive metabolic panel with GFR -     CBC with Differential/Platelet  Other orders -     mupirocin  ointment (BACTROBAN ) 2 %; Place 1 Application into the nose 3 (three) times daily.    F/u pending labs, 2week follow up

## 2024-01-18 NOTE — Telephone Encounter (Signed)
 Patient needs to fill his portion out as well. Advised him to come by here to get this filled out

## 2024-01-19 ENCOUNTER — Telehealth: Payer: Self-pay | Admitting: Primary Care

## 2024-01-19 ENCOUNTER — Ambulatory Visit: Payer: Self-pay | Admitting: Medical

## 2024-01-19 DIAGNOSIS — E538 Deficiency of other specified B group vitamins: Secondary | ICD-10-CM | POA: Diagnosis not present

## 2024-01-19 DIAGNOSIS — I10 Essential (primary) hypertension: Secondary | ICD-10-CM | POA: Diagnosis not present

## 2024-01-19 DIAGNOSIS — M1A9XX0 Chronic gout, unspecified, without tophus (tophi): Secondary | ICD-10-CM | POA: Diagnosis not present

## 2024-01-19 DIAGNOSIS — M5136 Other intervertebral disc degeneration, lumbar region with discogenic back pain only: Secondary | ICD-10-CM | POA: Diagnosis not present

## 2024-01-19 DIAGNOSIS — M25561 Pain in right knee: Secondary | ICD-10-CM | POA: Diagnosis not present

## 2024-01-19 DIAGNOSIS — E559 Vitamin D deficiency, unspecified: Secondary | ICD-10-CM | POA: Diagnosis not present

## 2024-01-19 DIAGNOSIS — G4733 Obstructive sleep apnea (adult) (pediatric): Secondary | ICD-10-CM | POA: Diagnosis not present

## 2024-01-19 DIAGNOSIS — Z6841 Body Mass Index (BMI) 40.0 and over, adult: Secondary | ICD-10-CM | POA: Diagnosis not present

## 2024-01-19 DIAGNOSIS — E785 Hyperlipidemia, unspecified: Secondary | ICD-10-CM | POA: Diagnosis not present

## 2024-01-19 DIAGNOSIS — G5603 Carpal tunnel syndrome, bilateral upper limbs: Secondary | ICD-10-CM | POA: Diagnosis not present

## 2024-01-19 DIAGNOSIS — J309 Allergic rhinitis, unspecified: Secondary | ICD-10-CM | POA: Diagnosis not present

## 2024-01-19 DIAGNOSIS — J9601 Acute respiratory failure with hypoxia: Secondary | ICD-10-CM | POA: Diagnosis not present

## 2024-01-19 DIAGNOSIS — M25562 Pain in left knee: Secondary | ICD-10-CM | POA: Diagnosis not present

## 2024-01-19 DIAGNOSIS — N390 Urinary tract infection, site not specified: Secondary | ICD-10-CM | POA: Diagnosis not present

## 2024-01-19 DIAGNOSIS — J188 Other pneumonia, unspecified organism: Secondary | ICD-10-CM | POA: Diagnosis not present

## 2024-01-19 DIAGNOSIS — K219 Gastro-esophageal reflux disease without esophagitis: Secondary | ICD-10-CM | POA: Diagnosis not present

## 2024-01-19 DIAGNOSIS — E119 Type 2 diabetes mellitus without complications: Secondary | ICD-10-CM | POA: Diagnosis not present

## 2024-01-19 DIAGNOSIS — F419 Anxiety disorder, unspecified: Secondary | ICD-10-CM | POA: Diagnosis not present

## 2024-01-19 DIAGNOSIS — B9620 Unspecified Escherichia coli [E. coli] as the cause of diseases classified elsewhere: Secondary | ICD-10-CM | POA: Diagnosis not present

## 2024-01-19 DIAGNOSIS — M4316 Spondylolisthesis, lumbar region: Secondary | ICD-10-CM | POA: Diagnosis not present

## 2024-01-19 DIAGNOSIS — G8929 Other chronic pain: Secondary | ICD-10-CM | POA: Diagnosis not present

## 2024-01-19 DIAGNOSIS — E039 Hypothyroidism, unspecified: Secondary | ICD-10-CM | POA: Diagnosis not present

## 2024-01-19 DIAGNOSIS — F32A Depression, unspecified: Secondary | ICD-10-CM | POA: Diagnosis not present

## 2024-01-19 LAB — CBC WITH DIFFERENTIAL/PLATELET
Basophils Absolute: 0 x10E3/uL (ref 0.0–0.2)
Basos: 0 %
EOS (ABSOLUTE): 0.1 x10E3/uL (ref 0.0–0.4)
Eos: 1 %
Hematocrit: 42.4 % (ref 37.5–51.0)
Hemoglobin: 13.9 g/dL (ref 13.0–17.7)
Immature Grans (Abs): 0.1 x10E3/uL (ref 0.0–0.1)
Immature Granulocytes: 1 %
Lymphocytes Absolute: 1.2 x10E3/uL (ref 0.7–3.1)
Lymphs: 13 %
MCH: 31.6 pg (ref 26.6–33.0)
MCHC: 32.8 g/dL (ref 31.5–35.7)
MCV: 96 fL (ref 79–97)
Monocytes Absolute: 0.7 x10E3/uL (ref 0.1–0.9)
Monocytes: 7 %
Neutrophils Absolute: 7.4 x10E3/uL — ABNORMAL HIGH (ref 1.4–7.0)
Neutrophils: 78 %
Platelets: 154 x10E3/uL (ref 150–450)
RBC: 4.4 x10E6/uL (ref 4.14–5.80)
RDW: 13.5 % (ref 11.6–15.4)
WBC: 9.6 x10E3/uL (ref 3.4–10.8)

## 2024-01-19 LAB — COMPREHENSIVE METABOLIC PANEL WITH GFR
ALT: 59 IU/L — ABNORMAL HIGH (ref 0–44)
AST: 31 IU/L (ref 0–40)
Albumin: 3.7 g/dL — ABNORMAL LOW (ref 3.8–4.9)
Alkaline Phosphatase: 100 IU/L (ref 44–121)
BUN/Creatinine Ratio: 22 — ABNORMAL HIGH (ref 9–20)
BUN: 20 mg/dL (ref 6–24)
Bilirubin Total: 0.4 mg/dL (ref 0.0–1.2)
CO2: 22 mmol/L (ref 20–29)
Calcium: 9.1 mg/dL (ref 8.7–10.2)
Chloride: 99 mmol/L (ref 96–106)
Creatinine, Ser: 0.92 mg/dL (ref 0.76–1.27)
Globulin, Total: 2.7 g/dL (ref 1.5–4.5)
Glucose: 175 mg/dL — ABNORMAL HIGH (ref 70–99)
Potassium: 4.3 mmol/L (ref 3.5–5.2)
Sodium: 138 mmol/L (ref 134–144)
Total Protein: 6.4 g/dL (ref 6.0–8.5)
eGFR: 99 mL/min/1.73 (ref >59)

## 2024-01-19 NOTE — Telephone Encounter (Signed)
 See last signed encounter. Is it OK to use [facility]'s Same Day slot for this PT who Dr. Jude wanted seen? The date is 7/30 @ 1:30. Thanks.

## 2024-01-19 NOTE — Progress Notes (Signed)
 Results sent through MyChart

## 2024-01-20 DIAGNOSIS — E559 Vitamin D deficiency, unspecified: Secondary | ICD-10-CM | POA: Diagnosis not present

## 2024-01-20 DIAGNOSIS — K219 Gastro-esophageal reflux disease without esophagitis: Secondary | ICD-10-CM | POA: Diagnosis not present

## 2024-01-20 DIAGNOSIS — M4316 Spondylolisthesis, lumbar region: Secondary | ICD-10-CM | POA: Diagnosis not present

## 2024-01-20 DIAGNOSIS — E039 Hypothyroidism, unspecified: Secondary | ICD-10-CM | POA: Diagnosis not present

## 2024-01-20 DIAGNOSIS — J188 Other pneumonia, unspecified organism: Secondary | ICD-10-CM | POA: Diagnosis not present

## 2024-01-20 DIAGNOSIS — B9620 Unspecified Escherichia coli [E. coli] as the cause of diseases classified elsewhere: Secondary | ICD-10-CM | POA: Diagnosis not present

## 2024-01-20 DIAGNOSIS — N390 Urinary tract infection, site not specified: Secondary | ICD-10-CM | POA: Diagnosis not present

## 2024-01-20 DIAGNOSIS — J309 Allergic rhinitis, unspecified: Secondary | ICD-10-CM | POA: Diagnosis not present

## 2024-01-20 DIAGNOSIS — E538 Deficiency of other specified B group vitamins: Secondary | ICD-10-CM | POA: Diagnosis not present

## 2024-01-20 DIAGNOSIS — M1A9XX0 Chronic gout, unspecified, without tophus (tophi): Secondary | ICD-10-CM | POA: Diagnosis not present

## 2024-01-20 DIAGNOSIS — M25562 Pain in left knee: Secondary | ICD-10-CM | POA: Diagnosis not present

## 2024-01-20 DIAGNOSIS — J9601 Acute respiratory failure with hypoxia: Secondary | ICD-10-CM | POA: Diagnosis not present

## 2024-01-20 DIAGNOSIS — E119 Type 2 diabetes mellitus without complications: Secondary | ICD-10-CM | POA: Diagnosis not present

## 2024-01-20 DIAGNOSIS — G8929 Other chronic pain: Secondary | ICD-10-CM | POA: Diagnosis not present

## 2024-01-20 DIAGNOSIS — Z6841 Body Mass Index (BMI) 40.0 and over, adult: Secondary | ICD-10-CM | POA: Diagnosis not present

## 2024-01-20 DIAGNOSIS — F32A Depression, unspecified: Secondary | ICD-10-CM | POA: Diagnosis not present

## 2024-01-20 DIAGNOSIS — G5603 Carpal tunnel syndrome, bilateral upper limbs: Secondary | ICD-10-CM | POA: Diagnosis not present

## 2024-01-20 DIAGNOSIS — I10 Essential (primary) hypertension: Secondary | ICD-10-CM | POA: Diagnosis not present

## 2024-01-20 DIAGNOSIS — M25561 Pain in right knee: Secondary | ICD-10-CM | POA: Diagnosis not present

## 2024-01-20 DIAGNOSIS — G4733 Obstructive sleep apnea (adult) (pediatric): Secondary | ICD-10-CM | POA: Diagnosis not present

## 2024-01-20 DIAGNOSIS — M5136 Other intervertebral disc degeneration, lumbar region with discogenic back pain only: Secondary | ICD-10-CM | POA: Diagnosis not present

## 2024-01-20 DIAGNOSIS — E785 Hyperlipidemia, unspecified: Secondary | ICD-10-CM | POA: Diagnosis not present

## 2024-01-20 DIAGNOSIS — F419 Anxiety disorder, unspecified: Secondary | ICD-10-CM | POA: Diagnosis not present

## 2024-01-20 NOTE — Telephone Encounter (Signed)
 Asking Con to [facility] appt. 7/30 @ 1:30 in Ms. Walsh's open space.

## 2024-01-21 NOTE — Telephone Encounter (Addendum)
 Attempted to call patient for scheduling of HFU. No answer and left VM at this time.  Per E. Hope, NP CMA if available patient may schedule in 07/30 slot at 1:30pm. (As prev noted)   With review of the patients current address there is availability at this time with K. Cobb, NP at our [facility] location as well within the requested timeframe for HFU. This is subject to change due to scheduling demands.

## 2024-01-21 NOTE — Telephone Encounter (Signed)
This should be fine 

## 2024-01-22 DIAGNOSIS — E559 Vitamin D deficiency, unspecified: Secondary | ICD-10-CM | POA: Diagnosis not present

## 2024-01-22 DIAGNOSIS — M5136 Other intervertebral disc degeneration, lumbar region with discogenic back pain only: Secondary | ICD-10-CM | POA: Diagnosis not present

## 2024-01-22 DIAGNOSIS — F419 Anxiety disorder, unspecified: Secondary | ICD-10-CM | POA: Diagnosis not present

## 2024-01-22 DIAGNOSIS — Z6841 Body Mass Index (BMI) 40.0 and over, adult: Secondary | ICD-10-CM | POA: Diagnosis not present

## 2024-01-22 DIAGNOSIS — E119 Type 2 diabetes mellitus without complications: Secondary | ICD-10-CM | POA: Diagnosis not present

## 2024-01-22 DIAGNOSIS — E039 Hypothyroidism, unspecified: Secondary | ICD-10-CM | POA: Diagnosis not present

## 2024-01-22 DIAGNOSIS — M545 Low back pain, unspecified: Secondary | ICD-10-CM | POA: Diagnosis not present

## 2024-01-22 DIAGNOSIS — M25561 Pain in right knee: Secondary | ICD-10-CM | POA: Diagnosis not present

## 2024-01-22 DIAGNOSIS — B9620 Unspecified Escherichia coli [E. coli] as the cause of diseases classified elsewhere: Secondary | ICD-10-CM | POA: Diagnosis not present

## 2024-01-22 DIAGNOSIS — J188 Other pneumonia, unspecified organism: Secondary | ICD-10-CM | POA: Diagnosis not present

## 2024-01-22 DIAGNOSIS — J9601 Acute respiratory failure with hypoxia: Secondary | ICD-10-CM | POA: Diagnosis not present

## 2024-01-22 DIAGNOSIS — G8929 Other chronic pain: Secondary | ICD-10-CM | POA: Diagnosis not present

## 2024-01-22 DIAGNOSIS — E785 Hyperlipidemia, unspecified: Secondary | ICD-10-CM | POA: Diagnosis not present

## 2024-01-22 DIAGNOSIS — G5603 Carpal tunnel syndrome, bilateral upper limbs: Secondary | ICD-10-CM | POA: Diagnosis not present

## 2024-01-22 DIAGNOSIS — N390 Urinary tract infection, site not specified: Secondary | ICD-10-CM | POA: Diagnosis not present

## 2024-01-22 DIAGNOSIS — M1A9XX0 Chronic gout, unspecified, without tophus (tophi): Secondary | ICD-10-CM | POA: Diagnosis not present

## 2024-01-22 DIAGNOSIS — M25562 Pain in left knee: Secondary | ICD-10-CM | POA: Diagnosis not present

## 2024-01-22 DIAGNOSIS — G4733 Obstructive sleep apnea (adult) (pediatric): Secondary | ICD-10-CM | POA: Diagnosis not present

## 2024-01-22 DIAGNOSIS — F32A Depression, unspecified: Secondary | ICD-10-CM | POA: Diagnosis not present

## 2024-01-22 DIAGNOSIS — J309 Allergic rhinitis, unspecified: Secondary | ICD-10-CM | POA: Diagnosis not present

## 2024-01-22 DIAGNOSIS — I10 Essential (primary) hypertension: Secondary | ICD-10-CM | POA: Diagnosis not present

## 2024-01-22 DIAGNOSIS — K219 Gastro-esophageal reflux disease without esophagitis: Secondary | ICD-10-CM | POA: Diagnosis not present

## 2024-01-22 DIAGNOSIS — M4316 Spondylolisthesis, lumbar region: Secondary | ICD-10-CM | POA: Diagnosis not present

## 2024-01-22 DIAGNOSIS — E538 Deficiency of other specified B group vitamins: Secondary | ICD-10-CM | POA: Diagnosis not present

## 2024-02-01 ENCOUNTER — Encounter: Payer: Self-pay | Admitting: Medical

## 2024-02-01 ENCOUNTER — Ambulatory Visit (INDEPENDENT_AMBULATORY_CARE_PROVIDER_SITE_OTHER): Admitting: Medical

## 2024-02-01 VITALS — BP 122/70 | HR 94 | Wt >= 6400 oz

## 2024-02-01 DIAGNOSIS — M4316 Spondylolisthesis, lumbar region: Secondary | ICD-10-CM | POA: Diagnosis not present

## 2024-02-01 DIAGNOSIS — R531 Weakness: Secondary | ICD-10-CM

## 2024-02-01 DIAGNOSIS — Z9289 Personal history of other medical treatment: Secondary | ICD-10-CM | POA: Diagnosis not present

## 2024-02-01 DIAGNOSIS — R262 Difficulty in walking, not elsewhere classified: Secondary | ICD-10-CM

## 2024-02-01 DIAGNOSIS — Z22322 Carrier or suspected carrier of Methicillin resistant Staphylococcus aureus: Secondary | ICD-10-CM

## 2024-02-01 MED ORDER — MUPIROCIN 2 % EX OINT: 1.0000 | TOPICAL_OINTMENT | Freq: Three times a day (TID) | CUTANEOUS | 0 refills | Status: DC

## 2024-02-01 NOTE — Progress Notes (Signed)
 I went ahead and put in a urgent referral for PT for [facility] and I see it was mentioned but I only see a referral done for pain medication for low back pain.

## 2024-02-01 NOTE — Addendum Note (Signed)
 Addended by: VICCI HUSBAND A on: 02/01/2024 02:12 PM   Modules accepted: Orders

## 2024-02-01 NOTE — Progress Notes (Signed)
 Subjective: Chief Complaint  Patient presents with   Follow-up    2 week Follow-up, breathing is fine now, still having issues with back, having numbness in hands and feet with some tingling.    Here for hospital follow up.  I saw him recently for hospital follow-up.  Since his hospital follow-up he is breathing better now.  He still feels weak though.  We had work restrictions last visit for part-time hours and use a walker.  He is ready to go back to full-time but still feels like he needs to have his walker on hand.  If he has to stand from longer periods he needs a walker.  He is still not back to his baseline.  He also has chronic back problems and leg problems.  He sees orthopedics.  They have referred him to physical therapy including hydrotherapy but he has not heard back from them.  The referral was placed 2 to 3 weeks ago.  Discussed his last viist and MRSA + nasal culture.  He never picked up the mupriocin  Was taken off lisinpil in hopital.  He was not sure if he needed to restart this or not.  Compliant with other medicines as usual.  After his last visit.  Home health physical therapy and Occupational Therapy were going to be coming out to the house but since he went back to work those services discontinued a week and a half ago.   Admit date: 01/02/2024 Discharge date: 01/07/2024  Discharge diagnoses: Primary: Acute respiratory failure with hypoxia, multifocal pneumonia  Secondary: E coli UTI Elevated troponin Diabetes type 2 HTN Hypothyroidism Back pain Gout Allergic rhinitis Rash Severe OSA Morbi obesity  No other aggravating or relieving factors. No other complaint.   Past Medical History:  Diagnosis Date   Allergy    Anxiety    Back pain    Bilateral chronic knee pain    Chronic headaches    Depression    Dermatographism    Dry skin    Fatigue    Frequent urination    Gout    Hx of echocardiogram 09/07/2019   normal LV function, EF 60-65%, no  wall motion abnormality; Dr. Michele Richardson   Hyperlipidemia    Hypertension    Joint pain    Knee pain    Obesity    Pre-diabetes    Sleep apnea    Stress    Wears glasses    Current Outpatient Medications on File Prior to Visit  Medication Sig Dispense Refill   albuterol  (VENTOLIN  HFA) 108 (90 Base) MCG/ACT inhaler Inhale 2 puffs into the lungs every 6 (six) hours as needed for wheezing or shortness of breath. 8 g 2   allopurinol  (ZYLOPRIM ) 300 MG tablet TAKE 1 TABLET(300 MG) BY MOUTH DAILY 90 tablet 0   B Complex-C (PROBEC-T PO) Take 1 tablet by mouth daily.     Calcium -Magnesium-Zinc 167-83-8 MG TABS Take 1 tablet by mouth daily.     cetirizine (ZYRTEC) 10 MG tablet Take 10 mg by mouth daily.     cholecalciferol (VITAMIN D3) 25 MCG (1000 UNIT) tablet Take 1 tablet (1,000 Units total) by mouth daily. 90 tablet 3   clotrimazole -betamethasone  (LOTRISONE ) cream Apply 1 Application topically daily. 30 g 0   Coenzyme Q10 (CO Q 10 PO) Take 1 tablet by mouth daily at 6 (six) AM.     colchicine  0.6 MG tablet Take 1 tablet (0.6 mg total) by mouth daily as needed (for gout).  cyclobenzaprine  (FLEXERIL ) 10 MG tablet Take 1 tablet (10 mg total) by mouth 3 (three) times daily as needed for muscle spasms. 30 tablet 3   FLUoxetine  (PROZAC ) 40 MG capsule TAKE 1 CAPSULE(40 MG) BY MOUTH DAILY 90 capsule 0   levothyroxine  (SYNTHROID ) 25 MCG tablet TAKE 1 TABLET BY MOUTH EVERY DAY BEFORE BREAKFAST 90 tablet 3   meclizine  (ANTIVERT ) 25 MG tablet Take 1 tablet (25 mg total) by mouth 2 (two) times daily as needed for dizziness.     metoprolol  succinate (TOPROL -XL) 50 MG 24 hr tablet TAKE 1 TABLET BY MOUTH IMMEDIATLEY WITH OR IMMEDIATELY FOLLOWING A MEAL 30 tablet 5   montelukast  (SINGULAIR ) 10 MG tablet TAKE 1 TABLET(10 MG) BY MOUTH AT BEDTIME 90 tablet 3   Multiple Vitamins-Minerals (MULTIVITAMIN WITH MINERALS) tablet Take 1 tablet by mouth daily. 90 tablet 3   mupirocin  ointment (BACTROBAN ) 2 % Place 1  Application into the nose 3 (three) times daily. 22 g 0   naproxen  (NAPROSYN ) 250 MG tablet Take 250 mg by mouth 3 (three) times daily as needed for mild pain (pain score 1-3).     nystatin  (MYCOSTATIN /NYSTOP ) powder Apply 1 Application topically 2 (two) times daily. 30 g 1   Omega-3 Fatty Acids (SALMON OIL-1000) 200 MG CAPS Take 1 capsule by mouth daily.     phenylephrine (SUDAFED PE) 10 MG TABS tablet Take 10 mg by mouth every 4 (four) hours as needed.     Semaglutide  (RYBELSUS ) 7 MG TABS Take 1 tablet by mouth daily.     sodium chloride  (OCEAN) 0.65 % SOLN nasal spray Place 1 spray into both nostrils as needed for congestion.     guaiFENesin (MUCINEX PO) Take 1 tablet by mouth every 4 (four) hours as needed (for congestion). (Patient not taking: Reported on 02/01/2024)     nitroGLYCERIN  (NITROSTAT ) 0.4 MG SL tablet Place 1 tablet (0.4 mg total) under the tongue every 5 (five) minutes as needed for chest pain. 30 tablet 1   predniSONE  (DELTASONE ) 10 MG tablet 40mg  daily for 1 day, then 30 milligram daily for 3 days then 20 mg daily for 3 days then 10 mg daily for 3 days then 5 mg daily for 3 days then stop. 24 tablet 0   No current facility-administered medications on file prior to visit.   ROS as in subjective    Objective: BP 122/70   Pulse 94   Wt (!) 426 lb 12.8 oz (193.6 kg)   SpO2 98%   BMI 64.89 kg/m   Wt Readings from Last 3 Encounters:  02/01/24 (!) 426 lb 12.8 oz (193.6 kg)  01/18/24 (!) 415 lb (188.2 kg)  01/07/24 (!) 405 lb 3.3 oz (183.8 kg)    Gen: seated with walker, NAD Lungs seem clear today, no dullness, no rhonchi, no wheezes Heart rrr, normal s1, s2 No significant given edema    Assessment: Encounter Diagnoses  Name Primary?   Weakness Yes   Spondylolisthesis of lumbar region    MRSA nasal colonization    History of recent hospitalization    Ambulatory dysfunction       Plan: I will change his work restrictions to going back full-time now but  with light duty and use of walker.  Restrictions will remain in place from 02/01/2024 through 03/03/2024.  We will check on the status of his referral to physical therapy with hydrotherapy at [facility] location  Lungs clear today.  Vital signs stable.  Continue to hold off on lisinopril  for  the time being to avoid kidney injury and since blood pressure is okay currently   I reviewed his recent hospitalization records regarding respiratory failure, multifocal pneumonia, discharge summary, labs, imaging.  CT chest imaging showed dense right upper lobe consolidation, multifocal severe airspace disease involving both lungs, differential including multifocal pneumonia versus atypical pneumonia versus ARDS.    Advise he go and pick up the mupirocin  and do the intranasal therapy for a week for positive MRSA culture.  Continue other medicines as usual  [facility] Baxter was seen today for follow-up.  Diagnoses and all orders for this visit:  Weakness  Spondylolisthesis of lumbar region  MRSA nasal colonization  History of recent hospitalization  Ambulatory dysfunction    F/u 1 month followup

## 2024-02-02 DIAGNOSIS — N390 Urinary tract infection, site not specified: Secondary | ICD-10-CM

## 2024-02-02 DIAGNOSIS — E039 Hypothyroidism, unspecified: Secondary | ICD-10-CM

## 2024-02-02 DIAGNOSIS — J188 Other pneumonia, unspecified organism: Secondary | ICD-10-CM | POA: Diagnosis not present

## 2024-02-02 DIAGNOSIS — B9620 Unspecified Escherichia coli [E. coli] as the cause of diseases classified elsewhere: Secondary | ICD-10-CM

## 2024-02-02 DIAGNOSIS — E119 Type 2 diabetes mellitus without complications: Secondary | ICD-10-CM | POA: Diagnosis not present

## 2024-02-02 DIAGNOSIS — G4733 Obstructive sleep apnea (adult) (pediatric): Secondary | ICD-10-CM

## 2024-02-02 DIAGNOSIS — J9601 Acute respiratory failure with hypoxia: Secondary | ICD-10-CM | POA: Diagnosis not present

## 2024-02-02 DIAGNOSIS — Z6841 Body Mass Index (BMI) 40.0 and over, adult: Secondary | ICD-10-CM

## 2024-02-02 DIAGNOSIS — I10 Essential (primary) hypertension: Secondary | ICD-10-CM

## 2024-02-02 DIAGNOSIS — E538 Deficiency of other specified B group vitamins: Secondary | ICD-10-CM

## 2024-02-02 DIAGNOSIS — E559 Vitamin D deficiency, unspecified: Secondary | ICD-10-CM

## 2024-02-02 NOTE — Telephone Encounter (Signed)
 Apt made NFN

## 2024-02-10 ENCOUNTER — Encounter: Payer: Self-pay | Admitting: Primary Care

## 2024-02-10 ENCOUNTER — Ambulatory Visit (INDEPENDENT_AMBULATORY_CARE_PROVIDER_SITE_OTHER): Payer: Self-pay | Admitting: Primary Care

## 2024-02-10 ENCOUNTER — Ambulatory Visit (INDEPENDENT_AMBULATORY_CARE_PROVIDER_SITE_OTHER)

## 2024-02-10 ENCOUNTER — Ambulatory Visit: Payer: Self-pay | Admitting: Primary Care

## 2024-02-10 VITALS — BP 169/79 | HR 94 | Ht 68.0 in | Wt >= 6400 oz

## 2024-02-10 DIAGNOSIS — G4730 Sleep apnea, unspecified: Secondary | ICD-10-CM | POA: Diagnosis not present

## 2024-02-10 DIAGNOSIS — Z87891 Personal history of nicotine dependence: Secondary | ICD-10-CM

## 2024-02-10 DIAGNOSIS — Z87440 Personal history of urinary (tract) infections: Secondary | ICD-10-CM

## 2024-02-10 DIAGNOSIS — J189 Pneumonia, unspecified organism: Secondary | ICD-10-CM

## 2024-02-10 NOTE — Progress Notes (Signed)
ATC LMTCB  

## 2024-02-10 NOTE — Progress Notes (Signed)
 Please let patient know CXR showed pneumonia has resolved

## 2024-02-10 NOTE — Progress Notes (Signed)
 @Patient  ID: Patrick Baxter, male    DOB: January 10, 1971, 53 y.o.   MRN: [id number]  Chief Complaint  Patient presents with   Follow-up    Pneumonia and OSA f/u    Referring provider: Bulah Alm RAMAN, PA-C  HPI: 53 year old male, former smoker. PMH significant for HTN, OSA, multifocal pneumonia, hypothyroidism, type 2 diabetes, degenerative disc disease, UTI, gout.   02/10/2024 Discussed the use of AI scribe software for clinical note transcription with the patient, who gave verbal consent to proceed.  History of Present Illness   Patrick Baxter is a 53 year old male who presents for follow-up after hospitalization for multifocal pneumonia.  He was hospitalized from June 21st to June 26th for multifocal pneumonia, during which he experienced significant shortness of breath and had an oxygen saturation of 68% on room air. He was treated with IV antibiotics and steroids, and was weaned off six liters of oxygen before being discharged on oral cefdinir and prednisone , which he completed. He feels better overall but continues to experience back pain. No fever, cough, or colored mucus production. Occasional shortness of breath.  He has a history of recurrent urinary tract infections (UTIs), which he describes as an ongoing issue. He experiences urinary symptoms including cloudy, discolored, and very smelly urine, but denies pain or burning. He recalls a previous episode where the infection reached his kidneys. He has not yet scheduled a follow-up with his urologist.  He experiences persistent back pain, which initially started in the lower back and moved to the lower middle back. Currently, the pain is more localized to his hips and is described as 'waves of pain' that feel more like nerve pain. A CT scan of his abdomen showed bilateral non-obstructing renal stones, but no acute findings or evidence of pyelonephritis. He has a follow-up appointment with [facility] in a couple of weeks.  He  has a history of sleep apnea and uses a CPAP machine, which he has had for at least seven years. He feels tired despite CPAP use and notes that the machine does not seem to be using water from the reservoir. He has been unable to sleep in his bed due to back pain. He has not lost significant weight recently and reports compliance with CPAP therapy.      Allergies  Allergen Reactions   Atorvastatin  Other (See Comments)    myalgia   Pravastatin  Sodium Other (See Comments)    myalgia    Immunization History  Administered Date(s) Administered   Influenza,inj,Quad PF,6+ Mos 04/20/2014, 09/20/2015, 06/24/2016, 04/14/2017, 04/23/2021, 04/01/2022   Moderna Covid-19 Vaccine Bivalent Booster 70yrs & up 05/23/2021   Moderna Sars-Covid-2 Vaccination 06/12/2020   PFIZER(Purple Top)SARS-COV-2 Vaccination 09/28/2019, 10/19/2019   Pneumococcal Polysaccharide-23 11/26/2021   Tdap 10/28/2016   Zoster Recombinant(Shingrix ) 12/01/2022, 02/04/2023    Past Medical History:  Diagnosis Date   Allergy    Anxiety    Back pain    Bilateral chronic knee pain    Chronic headaches    Depression    Dermatographism    Dry skin    Fatigue    Frequent urination    Gout    Hx of echocardiogram 09/07/2019   normal LV function, EF 60-65%, no wall motion abnormality; Dr. Michele Richardson   Hyperlipidemia    Hypertension    Joint pain    Knee pain    Obesity    Pre-diabetes    Sleep apnea    Stress    Wears  glasses     Tobacco History: Social History   Tobacco Use  Smoking Status Former   Types: Cigarettes  Smokeless Tobacco Never  Tobacco Comments   socially prior, not daily smoker   Counseling given: Not Answered Tobacco comments: socially prior, not daily smoker   Outpatient Medications Prior to Visit  Medication Sig Dispense Refill   albuterol  (VENTOLIN  HFA) 108 (90 Base) MCG/ACT inhaler Inhale 2 puffs into the lungs every 6 (six) hours as needed for wheezing or shortness of breath. 8 g 2    allopurinol  (ZYLOPRIM ) 300 MG tablet TAKE 1 TABLET(300 MG) BY MOUTH DAILY 90 tablet 0   B Complex-C (PROBEC-T PO) Take 1 tablet by mouth daily.     Calcium -Magnesium-Zinc 167-83-8 MG TABS Take 1 tablet by mouth daily.     cetirizine (ZYRTEC) 10 MG tablet Take 10 mg by mouth daily.     cholecalciferol (VITAMIN D3) 25 MCG (1000 UNIT) tablet Take 1 tablet (1,000 Units total) by mouth daily. 90 tablet 3   clotrimazole -betamethasone  (LOTRISONE ) cream Apply 1 Application topically daily. 30 g 0   Coenzyme Q10 (CO Q 10 PO) Take 1 tablet by mouth daily at 6 (six) AM.     colchicine  0.6 MG tablet Take 1 tablet (0.6 mg total) by mouth daily as needed (for gout).     cyclobenzaprine  (FLEXERIL ) 10 MG tablet Take 1 tablet (10 mg total) by mouth 3 (three) times daily as needed for muscle spasms. 30 tablet 3   FLUoxetine  (PROZAC ) 40 MG capsule TAKE 1 CAPSULE(40 MG) BY MOUTH DAILY 90 capsule 0   guaiFENesin (MUCINEX PO) Take 1 tablet by mouth every 4 (four) hours as needed (for congestion). (Patient not taking: Reported on 02/01/2024)     levothyroxine  (SYNTHROID ) 25 MCG tablet TAKE 1 TABLET BY MOUTH EVERY DAY BEFORE BREAKFAST 90 tablet 3   meclizine  (ANTIVERT ) 25 MG tablet Take 1 tablet (25 mg total) by mouth 2 (two) times daily as needed for dizziness.     metoprolol  succinate (TOPROL -XL) 50 MG 24 hr tablet TAKE 1 TABLET BY MOUTH IMMEDIATLEY WITH OR IMMEDIATELY FOLLOWING A MEAL 30 tablet 5   montelukast  (SINGULAIR ) 10 MG tablet TAKE 1 TABLET(10 MG) BY MOUTH AT BEDTIME 90 tablet 3   Multiple Vitamins-Minerals (MULTIVITAMIN WITH MINERALS) tablet Take 1 tablet by mouth daily. 90 tablet 3   mupirocin  ointment (BACTROBAN ) 2 % Place 1 Application into the nose 3 (three) times daily. 22 g 0   naproxen  (NAPROSYN ) 250 MG tablet Take 250 mg by mouth 3 (three) times daily as needed for mild pain (pain score 1-3).     nitroGLYCERIN  (NITROSTAT ) 0.4 MG SL tablet Place 1 tablet (0.4 mg total) under the tongue every 5 (five)  minutes as needed for chest pain. 30 tablet 1   nystatin  (MYCOSTATIN /NYSTOP ) powder Apply 1 Application topically 2 (two) times daily. 30 g 1   Omega-3 Fatty Acids (SALMON OIL-1000) 200 MG CAPS Take 1 capsule by mouth daily.     phenylephrine (SUDAFED PE) 10 MG TABS tablet Take 10 mg by mouth every 4 (four) hours as needed.     predniSONE  (DELTASONE ) 10 MG tablet 40mg  daily for 1 day, then 30 milligram daily for 3 days then 20 mg daily for 3 days then 10 mg daily for 3 days then 5 mg daily for 3 days then stop. 24 tablet 0   Semaglutide  (RYBELSUS ) 7 MG TABS Take 1 tablet by mouth daily.     sodium chloride  (OCEAN) 0.65 %  SOLN nasal spray Place 1 spray into both nostrils as needed for congestion.     No facility-administered medications prior to visit.   Review of Systems  Review of Systems  Constitutional: Negative.   HENT: Negative.    Respiratory:  Negative for cough, shortness of breath and wheezing.    Physical Exam  There were no vitals taken for this visit. Physical Exam Constitutional:      General: He is not in acute distress.    Appearance: Normal appearance. He is obese. He is not ill-appearing.  HENT:     Head: Normocephalic and atraumatic.  Cardiovascular:     Rate and Rhythm: Normal rate and regular rhythm.  Pulmonary:     Effort: Pulmonary effort is normal.     Breath sounds: Normal breath sounds. No wheezing or rhonchi.  Musculoskeletal:        General: Normal range of motion.  Skin:    General: Skin is warm and dry.  Neurological:     General: No focal deficit present.     Mental Status: He is alert and oriented to person, place, and time. Mental status is at baseline.  Psychiatric:        Mood and Affect: Mood normal.        Behavior: Behavior normal.        Thought Content: Thought content normal.        Judgment: Judgment normal.      Lab Results:  CBC    Component Value Date/Time   WBC 9.6 01/18/2024 1236   WBC 11.3 (H) 01/07/2024 0248   RBC  4.40 01/18/2024 1236   RBC 4.56 01/07/2024 0248   HGB 13.9 01/18/2024 1236   HCT 42.4 01/18/2024 1236   PLT 154 01/18/2024 1236   MCV 96 01/18/2024 1236   MCH 31.6 01/18/2024 1236   MCH 31.1 01/07/2024 0248   MCHC 32.8 01/18/2024 1236   MCHC 33.8 01/07/2024 0248   RDW 13.5 01/18/2024 1236   LYMPHSABS 1.2 01/18/2024 1236   MONOABS 1.0 01/06/2024 0228   EOSABS 0.1 01/18/2024 1236   BASOSABS 0.0 01/18/2024 1236    BMET    Component Value Date/Time   NA 138 01/18/2024 1236   K 4.3 01/18/2024 1236   CL 99 01/18/2024 1236   CO2 22 01/18/2024 1236   GLUCOSE 175 (H) 01/18/2024 1236   GLUCOSE 105 (H) 01/07/2024 0248   BUN 20 01/18/2024 1236   CREATININE 0.92 01/18/2024 1236   CREATININE 0.92 09/06/2019 1051   CALCIUM  9.1 01/18/2024 1236   GFRNONAA >60 01/07/2024 0248   GFRNONAA 97 09/06/2019 1051   GFRAA 109 09/05/2020 1514   GFRAA 112 09/06/2019 1051    BNP    Component Value Date/Time   BNP 347.3 (H) 01/02/2024 1959    ProBNP No results found for: PROBNP  Imaging: No results found.   Assessment & Plan:   1. Multifocal pneumonia (Primary) - DG Chest 2 View; Future - CBC with Differential  2. Hx of recurrent urinary tract infection - CBC with Differential - Urine Culture; Future  3. Sleep apnea, unspecified type  Assessment and Plan    Recurrent urinary tract infection with bilateral non-obstructing renal stones Chronic urinary tract infection with symptoms of cloudy, discolored, and malodorous urine. Bilateral non-obstructing renal stones identified on previous CT scan. Back pain may be related to UTI or renal stones. - Order urine culture to assess for infection - Order blood work to check white blood cell count and  kidney function - Encourage follow-up with urologist for further management  Chronic back pain - Encourage follow-up with orthopedic specialist for further evaluation  Obstructive sleep apnea on CPAP therapy Obstructive sleep apnea  managed with CPAP therapy. Issues with current CPAP machine include overheating and lack of water usage. The machine is approximately seven years old, indicating need for replacement. Discussion about using father's CPAP machine as an alternative. Compliance with CPAP therapy needs assessment. If compliance is confirmed, a new machine may not require a repeat sleep study unless for insurance purposes. If residual apneas are present, an in-lab titration study may be necessary. - Obtain compliance report from [employer]  - Consider using father's CPAP machine after setting it to current settings - Evaluate need for new CPAP machine based on compliance report - Consider in-lab titration study if compliance report indicates residual apneas or inadequate pressure settings  Multifocal pneumonia, recently resolved Multifocal pneumonia treated with IV and oral antibiotics, symptoms have clinically resolved. - Order chest x-ray to confirm resolution of pneumonia   Almarie LELON Ferrari, NP 02/10/2024

## 2024-02-10 NOTE — Patient Instructions (Addendum)
  VISIT SUMMARY: You came in today for a follow-up after your recent hospitalization for multifocal pneumonia. You have been experiencing back pain, occasional shortness of breath, and symptoms of a urinary tract infection. We also discussed your ongoing issues with sleep apnea and your CPAP machine.  YOUR PLAN:  -CHRONIC URINARY TRACT INFECTION WITH BILATERAL NON-OBSTRUCTING RENAL STONES: You have a chronic urinary tract infection, which means you have a persistent infection in your urinary system. This is accompanied by non-obstructing kidney stones that are not currently blocking your urine flow. We will order a urine culture to check for infection and blood work to assess your white blood cell count and kidney function. Please follow up with your urologist for further management.  -CHRONIC BACK PAIN: Your chronic back pain, which has recently changed in location and character, may be related to your urinary tract infection or kidney stones. We will order blood work and a urine culture to check for any infection or kidney involvement. Please follow up with your orthopedic specialist for further evaluation.  -OBSTRUCTIVE SLEEP APNEA ON CPAP THERAPY: Obstructive sleep apnea is a condition where your breathing stops and starts during sleep. You are currently using a CPAP machine, but it seems to be malfunctioning. We will obtain a compliance report from your CPAP machine to see if you are using it correctly. You may consider using your father's CPAP machine after setting it to your current settings. Depending on the compliance report, you might need a new CPAP machine or an in-lab titration study to adjust the pressure settings.  -MULTIFOCAL PNEUMONIA, RECENTLY RESOLVED: Multifocal pneumonia is an infection in multiple areas of your lungs. You were treated with antibiotics and steroids, and it has now resolved. We will order a chest x-ray to confirm that the pneumonia has completely  cleared.  INSTRUCTIONS: Please follow up with your urologist for your chronic urinary tract infection and kidney stones. Also, follow up with your orthopedic specialist for your chronic back pain. We will obtain a compliance report from your CPAP machine to evaluate your sleep apnea treatment. Additionally, we will order a chest x-ray to confirm the resolution of your pneumonia.  Follow-up 3 months with Beth NP / OSA on CPAP

## 2024-02-11 ENCOUNTER — Telehealth: Payer: Self-pay

## 2024-02-11 NOTE — Telephone Encounter (Signed)
 Copied from CRM [id number]. Topic: Clinical - Lab/Test Results >> Feb 10, 2024  4:54 PM Patrick Baxter wrote: Reason for CRM: PT RETURNING CALL PROVIDED LAB RESULTS PER NOT FOR CHEST XR   LM- okay DPR - Lab result are not available  to us .  NFN-

## 2024-02-11 NOTE — Progress Notes (Signed)
 ATC X3. LMTCB. I will send a message via Mychart then completing note. NFN

## 2024-02-11 NOTE — Telephone Encounter (Signed)
 Pt had called me back. I informed pt of Beth's note and pt verbalized understanding. Pt also stated that his work scheduled was changed and he needed to reschedule his lab appt for tomorrow. I have rescheduled this for 02-17-24 at 8:30 due to pt's request of a Monday or Wednesday morning. NFN

## 2024-02-12 ENCOUNTER — Other Ambulatory Visit

## 2024-02-17 ENCOUNTER — Other Ambulatory Visit

## 2024-02-24 DIAGNOSIS — M5459 Other low back pain: Secondary | ICD-10-CM | POA: Diagnosis not present

## 2024-03-02 ENCOUNTER — Ambulatory Visit: Payer: Self-pay | Admitting: Medical

## 2024-03-02 ENCOUNTER — Encounter: Payer: Self-pay | Admitting: Medical

## 2024-03-02 VITALS — BP 152/76 | HR 94 | Ht 68.0 in | Wt >= 6400 oz

## 2024-03-02 DIAGNOSIS — R609 Edema, unspecified: Secondary | ICD-10-CM | POA: Diagnosis not present

## 2024-03-02 DIAGNOSIS — M1000 Idiopathic gout, unspecified site: Secondary | ICD-10-CM

## 2024-03-02 DIAGNOSIS — E039 Hypothyroidism, unspecified: Secondary | ICD-10-CM

## 2024-03-02 DIAGNOSIS — I10 Essential (primary) hypertension: Secondary | ICD-10-CM

## 2024-03-02 DIAGNOSIS — G8929 Other chronic pain: Secondary | ICD-10-CM

## 2024-03-02 DIAGNOSIS — M545 Low back pain, unspecified: Secondary | ICD-10-CM

## 2024-03-02 DIAGNOSIS — E1165 Type 2 diabetes mellitus with hyperglycemia: Secondary | ICD-10-CM

## 2024-03-02 MED ORDER — FUROSEMIDE 20 MG PO TABS: 20.0000 mg | ORAL_TABLET | Freq: Every day | ORAL | 0 refills | Status: DC

## 2024-03-02 NOTE — Progress Notes (Signed)
 Subjective:  Patrick Baxter is a 53 y.o. male who presents for Chief Complaint  Patient presents with   Follow-up    Numbness, swelling and tingling in both legs and feet     Here for numbness, swelling and tingling in legs and feet.  Has recently seen pain management, surgery, ortho as well recently.    He saw Dr. Burnetta orthopedic surgeon at [facility] as well as Dr. Gifford pain management at [facility] recently.  He has new MRI lumbar spine ordered for concern for possible stenosis as potential cause of his leg pain, numbness and tingling.   His other specialists advised he f/u with us  PCP about swelling.  HTN - compliant with Toprol , but he recently started back on his lisinopril .  Swelling has improved some.   Gets numb in feet in general, some in the lower legs.  One of his other doctors felt it wasn't diabetic neuropathy.   He wondered about gout or diabetes causing the swelling.  He is mostly sedentary, and has had to recently sleep in recliner as he has bad back pain if lying on his bed flat.    No compression hose, no chest pain, no dyspnea or SOB  No other aggravating or relieving factors.    No other c/o.  Past Medical History:  Diagnosis Date   Allergy    Anxiety    Back pain    Bilateral chronic knee pain    Chronic headaches    Depression    Dermatographism    Dry skin    Fatigue    Frequent urination    Gout    Hx of echocardiogram 09/07/2019   normal LV function, EF 60-65%, no wall motion abnormality; Dr. Michele Richardson   Hyperlipidemia    Hypertension    Joint pain    Knee pain    Obesity    Pre-diabetes    Sleep apnea    Stress    Wears glasses    Current Outpatient Medications on File Prior to Visit  Medication Sig Dispense Refill   allopurinol  (ZYLOPRIM ) 300 MG tablet TAKE 1 TABLET(300 MG) BY MOUTH DAILY 90 tablet 0   B Complex-C (PROBEC-T PO) Take 1 tablet by mouth daily.     Calcium -Magnesium-Zinc 167-83-8 MG TABS Take 1 tablet by mouth  daily.     cetirizine (ZYRTEC) 10 MG tablet Take 10 mg by mouth daily.     cholecalciferol (VITAMIN D3) 25 MCG (1000 UNIT) tablet Take 1 tablet (1,000 Units total) by mouth daily. 90 tablet 3   clotrimazole -betamethasone  (LOTRISONE ) cream Apply 1 Application topically daily. 30 g 0   Coenzyme Q10 (CO Q 10 PO) Take 1 tablet by mouth daily at 6 (six) AM.     colchicine  0.6 MG tablet Take 1 tablet (0.6 mg total) by mouth daily as needed (for gout).     cyclobenzaprine  (FLEXERIL ) 10 MG tablet Take 1 tablet (10 mg total) by mouth 3 (three) times daily as needed for muscle spasms. 30 tablet 3   FLUoxetine  (PROZAC ) 40 MG capsule TAKE 1 CAPSULE(40 MG) BY MOUTH DAILY 90 capsule 0   guaiFENesin (MUCINEX PO) Take 1 tablet by mouth every 4 (four) hours as needed (for congestion).     levothyroxine  (SYNTHROID ) 25 MCG tablet TAKE 1 TABLET BY MOUTH EVERY DAY BEFORE BREAKFAST 90 tablet 3   meclizine  (ANTIVERT ) 25 MG tablet Take 1 tablet (25 mg total) by mouth 2 (two) times daily as needed for dizziness.  metoprolol  succinate (TOPROL -XL) 50 MG 24 hr tablet TAKE 1 TABLET BY MOUTH IMMEDIATLEY WITH OR IMMEDIATELY FOLLOWING A MEAL 30 tablet 5   montelukast  (SINGULAIR ) 10 MG tablet TAKE 1 TABLET(10 MG) BY MOUTH AT BEDTIME 90 tablet 3   Multiple Vitamins-Minerals (MULTIVITAMIN WITH MINERALS) tablet Take 1 tablet by mouth daily. 90 tablet 3   mupirocin  ointment (BACTROBAN ) 2 % Place 1 Application into the nose 3 (three) times daily. 22 g 0   naproxen  (NAPROSYN ) 250 MG tablet Take 250 mg by mouth 3 (three) times daily as needed for mild pain (pain score 1-3).     nystatin  (MYCOSTATIN /NYSTOP ) powder Apply 1 Application topically 2 (two) times daily. 30 g 1   Omega-3 Fatty Acids (SALMON OIL-1000) 200 MG CAPS Take 1 capsule by mouth daily.     phenylephrine (SUDAFED PE) 10 MG TABS tablet Take 10 mg by mouth every 4 (four) hours as needed.     Semaglutide  (RYBELSUS ) 7 MG TABS Take 1 tablet by mouth daily.     sodium  chloride (OCEAN) 0.65 % SOLN nasal spray Place 1 spray into both nostrils as needed for congestion.     albuterol  (VENTOLIN  HFA) 108 (90 Base) MCG/ACT inhaler Inhale 2 puffs into the lungs every 6 (six) hours as needed for wheezing or shortness of breath. (Patient not taking: Reported on 03/02/2024) 8 g 2   nitroGLYCERIN  (NITROSTAT ) 0.4 MG SL tablet Place 1 tablet (0.4 mg total) under the tongue every 5 (five) minutes as needed for chest pain. (Patient not taking: Reported on 03/02/2024) 30 tablet 1   predniSONE  (DELTASONE ) 10 MG tablet 40mg  daily for 1 day, then 30 milligram daily for 3 days then 20 mg daily for 3 days then 10 mg daily for 3 days then 5 mg daily for 3 days then stop. (Patient not taking: Reported on 03/02/2024) 24 tablet 0   No current facility-administered medications on file prior to visit.    The following portions of the patient's history were reviewed and updated as appropriate: allergies, current medications, past family history, past medical history, past social history, past surgical history and problem list.  ROS Otherwise as in subjective above    Objective: BP (!) 152/76 (BP Location: Right Arm, Patient Position: Sitting)   Pulse 94   Ht 5' 8 (1.727 m)   Wt (!) 426 lb (193.2 kg)   SpO2 96%   BMI 64.77 kg/m   General appearance: alert, no distress, well developed, well nourished Neck: supple, no lymphadenopathy, no thyromegaly, no masses, no JVD Heart: RRR, normal S1, S2, no murmurs Lungs: CTA bilaterally, no wheezes, rhonchi, or rales Pulses: 2+ radial pulses, 2+ pedal pulses, normal cap refill Ext: 1+ nonpitting  bilat LE edema   Assessment: Encounter Diagnoses  Name Primary?   Edema, unspecified type Yes   Type 2 diabetes mellitus with hyperglycemia, without long-term current use of insulin  (HCC)    Hypothyroidism, unspecified type    Essential hypertension    Chronic bilateral low back pain without sciatica    Morbid obesity (HCC)    Idiopathic  gout, unspecified chronicity, unspecified site      Plan: We discussed possible causes of swelling in the legs as well as numbness tingling and pain in her legs.  He is seeing orthopedics and pain management at [facility].  He has an MRI pending as Dr. Gifford is worried about stenosis causing some of his leg pain and numbness and tingling.  Regarding edema though, he has potential factors  for this.  He is mostly sedentary, he has not been doing a whole lot since his pneumonia a few months ago.  So dependent edema could be a factor.  I would not doubt chronic venous insufficiency as well.  I recommended regular exercise and compression hose.  He has not been compression hose before  Looking back in retrospect back in June he had a hospitalization his BNP was slightly elevated, albumin slightly low.  We will recheck those labs today.  Begin Lasix  20 mg once daily.  He recently started back on his lisinopril  and continues on Toprol  for blood pressure which is not controlled today.  I reassured that his gout is not the cause of his swelling.  His last uric acid a few months ago was normal.  Most recent hemoglobin A1c at goal.  Last thyroid  labs normal back in May 2025  Lamonte [name] was seen today for follow-up.  Diagnoses and all orders for this visit:  Edema, unspecified type -     Brain natriuretic peptide -     Hepatic Function Panel  Type 2 diabetes mellitus with hyperglycemia, without long-term current use of insulin  (HCC)  Hypothyroidism, unspecified type  Essential hypertension  Chronic bilateral low back pain without sciatica  Morbid obesity (HCC)  Idiopathic gout, unspecified chronicity, unspecified site  Other orders -     furosemide  (LASIX ) 20 MG tablet; Take 1 tablet (20 mg total) by mouth daily.    Follow up: Pending labs

## 2024-03-03 ENCOUNTER — Ambulatory Visit: Payer: Self-pay | Admitting: Medical

## 2024-03-03 LAB — HEPATIC FUNCTION PANEL
ALT: 15 IU/L (ref 0–44)
AST: 23 IU/L (ref 0–40)
Albumin: 3.8 g/dL (ref 3.8–4.9)
Alkaline Phosphatase: 87 IU/L (ref 44–121)
Bilirubin Total: 0.5 mg/dL (ref 0.0–1.2)
Bilirubin, Direct: 0.19 mg/dL (ref 0.00–0.40)
Total Protein: 6.8 g/dL (ref 6.0–8.5)

## 2024-03-03 LAB — BRAIN NATRIURETIC PEPTIDE: BNP: 12.8 pg/mL (ref 0.0–100.0)

## 2024-03-03 NOTE — Progress Notes (Signed)
 Results through MyChart

## 2024-03-03 NOTE — Progress Notes (Signed)
 Results seen in MyChart.

## 2024-03-04 NOTE — Progress Notes (Signed)
 Results through MyChart

## 2024-03-07 ENCOUNTER — Other Ambulatory Visit: Payer: Self-pay | Admitting: Medical

## 2024-03-09 ENCOUNTER — Other Ambulatory Visit: Payer: Self-pay | Admitting: Medical

## 2024-03-13 ENCOUNTER — Other Ambulatory Visit: Payer: Self-pay | Admitting: Medical

## 2024-03-13 DIAGNOSIS — I10 Essential (primary) hypertension: Secondary | ICD-10-CM

## 2024-03-13 DIAGNOSIS — M545 Low back pain, unspecified: Secondary | ICD-10-CM | POA: Diagnosis not present

## 2024-03-21 ENCOUNTER — Other Ambulatory Visit: Payer: Self-pay | Admitting: Medical

## 2024-03-21 NOTE — Telephone Encounter (Signed)
 Was sent in on 03/07/24

## 2024-04-05 DIAGNOSIS — M4807 Spinal stenosis, lumbosacral region: Secondary | ICD-10-CM | POA: Diagnosis not present

## 2024-04-05 DIAGNOSIS — M47819 Spondylosis without myelopathy or radiculopathy, site unspecified: Secondary | ICD-10-CM | POA: Diagnosis not present

## 2024-04-06 ENCOUNTER — Other Ambulatory Visit: Payer: Self-pay

## 2024-04-06 ENCOUNTER — Encounter (HOSPITAL_BASED_OUTPATIENT_CLINIC_OR_DEPARTMENT_OTHER): Payer: Self-pay | Admitting: Physical Therapy

## 2024-04-06 ENCOUNTER — Ambulatory Visit (HOSPITAL_BASED_OUTPATIENT_CLINIC_OR_DEPARTMENT_OTHER): Payer: Self-pay | Attending: Medical | Admitting: Physical Therapy

## 2024-04-06 DIAGNOSIS — M6281 Muscle weakness (generalized): Secondary | ICD-10-CM | POA: Diagnosis not present

## 2024-04-06 DIAGNOSIS — M4316 Spondylolisthesis, lumbar region: Secondary | ICD-10-CM | POA: Insufficient documentation

## 2024-04-06 DIAGNOSIS — M5459 Other low back pain: Secondary | ICD-10-CM | POA: Insufficient documentation

## 2024-04-06 DIAGNOSIS — R531 Weakness: Secondary | ICD-10-CM | POA: Diagnosis not present

## 2024-04-06 DIAGNOSIS — R262 Difficulty in walking, not elsewhere classified: Secondary | ICD-10-CM | POA: Insufficient documentation

## 2024-04-06 NOTE — Therapy (Signed)
 OUTPATIENT PHYSICAL THERAPY THORACOLUMBAR EVALUATION   Patient Name: Patrick Baxter MRN: [id number] DOB:Apr 01, 1971, 53 y.o., male Today's Date: 04/06/2024  END OF SESSION:  PT End of Session - 04/06/24 1237     Visit Number 1    Date for Recertification  06/03/24    PT Start Time 0719    PT Stop Time 0758    PT Time Calculation (min) 39 min    Activity Tolerance Patient tolerated treatment well    Behavior During Therapy [facility] for tasks assessed/performed          Past Medical History:  Diagnosis Date   Allergy    Anxiety    Back pain    Bilateral chronic knee pain    Chronic headaches    Depression    Dermatographism    Dry skin    Fatigue    Frequent urination    Gout    Hx of echocardiogram 09/07/2019   normal LV function, EF 60-65%, no wall motion abnormality; Dr. Michele Richardson   Hyperlipidemia    Hypertension    Joint pain    Knee pain    Obesity    Pre-diabetes    Sleep apnea    Stress    Wears glasses    Past Surgical History:  Procedure Laterality Date   IR INJECT/THERA/INC NEEDLE/CATH/PLC EPI/LUMB/SAC [facility]  09/14/2020   IR INJECT/THERA/INC NEEDLE/CATH/PLC EPI/LUMB/SAC W/IMG  09/14/2020   KNEE SURGERY  11/2006   right knee arthroscopy and meniscal repair, Dr. Rendell   Patient Active Problem List   Diagnosis Date Noted   Edema 03/02/2024   Morbid obesity (HCC) 03/02/2024   Fatigue 01/18/2024   Multifocal pneumonia 01/03/2024   Acute respiratory failure with hypoxia (HCC) 01/02/2024   Statin-induced myositis 12/11/2023   Tinea cruris 12/11/2023   Dysuria 12/11/2023   Chronic gout without tophus 12/11/2023   Screening for hematuria or proteinuria 12/11/2023   Medication management 12/01/2022   Influenza-like illness 07/16/2022   Neck discomfort 04/01/2022   BMI 60.0-69.9, adult (HCC) 05/13/2021   Type 2 diabetes mellitus with hyperglycemia, without long-term current use of insulin  (HCC) 04/23/2021   Caregiver burden 04/23/2021   Colon cancer screening  10/22/2020   Screening for prostate cancer 10/22/2020   Hypothyroidism 09/05/2020   B12 deficiency 11/17/2017   Vitamin D  deficiency 11/17/2017   Knee pain, bilateral 08/27/2017   OSA (obstructive sleep apnea) 08/27/2017   Vaccine counseling 10/28/2016   Closed compression fracture of thoracic vertebra (HCC) 08/12/2016   Spondylolisthesis of lumbar region 08/12/2016   DDD (degenerative disc disease), lumbar 08/12/2016   Bilateral carpal tunnel syndrome 06/24/2016   Acute bilateral low back pain with bilateral sciatica 05/05/2016   Chronic bilateral low back pain without sciatica 05/05/2016   Encounter for health maintenance examination in adult 09/20/2015   Screen for STD (sexually transmitted disease) 09/20/2015   Rhinitis, allergic 09/20/2015   Family history of premature CAD 09/20/2015   Anxiety and depression 04/20/2014   Essential hypertension 04/20/2014   Primary gout 04/20/2014    PCP: Bulah Alm RAMAN, PA-C   REFERRING PROVIDER: Bulah Alm RAMAN, PA-C   REFERRING DIAG:  R53.1 (ICD-10-CM) - Weakness  M43.16 (ICD-10-CM) - Spondylolisthesis of lumbar region  R26.2 (ICD-10-CM) - Ambulatory dysfunction    Rationale for Evaluation and Treatment: Rehabilitation  THERAPY DIAG:  Other low back pain  Muscle weakness (generalized)  ONSET DATE: 3 month exacerbation  SUBJECTIVE:  SUBJECTIVE STATEMENT: LBP on and off for 15 yrs.  Recently had a pneumonia and pain increased.  Saw dr Bonner yesterday and he states I have stenosis.  Was unable to explain why the pain has gotten worse.  Prior dull ache now sharp. I was able to push through up until now. Can't lie on my back now sleeping in relax. Have had some PT in past, had shots helped ok. Had DN.  PERTINENT HISTORY:  ARF w/hypoxia and pnuem  6/25 Spondylolisthesis of lumbar region  R knee oa  PAIN:  Are you having pain? Yes: NPRS scale: current 4/10; walking to setting 6/10; least 2/10 Pain location: low lumbar spine with radiating pain into buttock and hip Pain description: ache with sharp pain Aggravating factors: sitting, standing, walking Relieving factors: Aleve , gabapentin  PRECAUTIONS: None  RED FLAGS: None   WEIGHT BEARING RESTRICTIONS: No  FALLS:  Has patient fallen in last 6 months? No  LIVING ENVIRONMENT: Lives with: lives alone Lives in: House/apartment Stairs: Yes: External: 5 steps; on right going up Has following equipment at home: None  OCCUPATION: librarian  PLOF: Independent  PATIENT GOALS: reduce pain, get back to some functional level, kick start the weight loss  NEXT MD VISIT: 6 weeks OBJECTIVE:  Note: Objective measures were completed at Evaluation unless otherwise noted.  DIAGNOSTIC FINDINGS:  9/25 lumbar spine IMAGING:  MRI of the lumbar spine performed on 03/13/2024 demonstrates significant multilevel lumbar degenerative disc disease, most severe at L1-L2 and L2-L3, with associated Modic endplate changes. Multilevel central canal stenosis is noted, most pronounced from L1-L2 through L4-L5, with additional stenosis present at L5-S1. Multilevel facet arthropathy is also observed.   MRI lumbar spine 6/25: There is no evidence of lumbar spine fracture. Alignment is normal. Moderate degenerative disc disease is noted at T12-L1, L1-2 and L2-3. Mild degenerative disc disease is noted at L3-4 and L4-5.  PATIENT SURVEYS:  ODI   COGNITION: Overall cognitive status: Within functional limits for tasks assessed     SENSATION: WFL   POSTURE: No Significant postural limitations  PALPATION: Due to body habitus  LUMBAR ROM:   AROM eval  Flexion full  Extension full  Right lateral flexion 75% limited P!  Left lateral flexion 50% limited P!  Right rotation   Left rotation     (Blank rows = not tested)  LOWER EXTREMITY ROM:     wfl  LOWER EXTREMITY MMT:    HD lbs Right eval Left eval  Hip flexion 33.4 46.8  Hip extension    Hip abduction    Hip adduction    Hip internal rotation    Hip external rotation    Knee flexion    Knee extension    Ankle dorsiflexion    Ankle plantarflexion    Ankle inversion    Ankle eversion     (Blank rows = not tested)  LUMBAR SPECIAL TESTS:  Slump test: Negative  FUNCTIONAL TESTS:  Timed up and go (TUG): 16.84  GAIT: Distance walked: 400 ft Assistive device utilized: None Level of assistance: Complete Independence Comments: off loading left with initiation of gait (oa right), shortened step length. Pt reports leaning forward with prolonged amb  TREATMENT  Eval Self care:Posture and body mechanic instruction.  Use of AD; transfers; sleeping positions  PATIENT EDUCATION:  Education details: Discussed eval findings, rehab rationale, aquatic program progression/POC and pools in area. Patient is in agreement  Person educated: Patient Education method: Explanation Education comprehension: verbalized understanding  HOME EXERCISE PROGRAM: tba  ASSESSMENT:  CLINICAL IMPRESSION: Patient is a 53 y.o. m who was seen today for physical therapy evaluation and treatment for LBP.  Recent MRI indicates DDD and stenosis throughout lumbar spine. He has had a recent exacerbation after a prolonged illness a few months ago.  He is familiar with water exercises as he has completed in past while losing weight. Patient presents with pain limited deficits in LB and hips in ROM, endurance, activity tolerance, gait, balance, and functional mobility. Patient is having to modify and restrict ADL's as indicated by subjective information, objective measures and outcome measure score . He will benefit from  skilled PT intervention to improve all deficits and return pt to PLOF. Plan to incorporate land and aquatic setting for optimal outcome.    OBJECTIVE IMPAIRMENTS: Abnormal gait, decreased activity tolerance, decreased endurance, decreased mobility, difficulty walking, decreased ROM, decreased strength, increased muscle spasms, obesity, and pain.   ACTIVITY LIMITATIONS: carrying, lifting, bending, sitting, standing, squatting, sleeping, stairs, transfers, bed mobility, and locomotion level  PARTICIPATION LIMITATIONS: meal prep, cleaning, shopping, community activity, and yard work  PERSONAL FACTORS: Fitness, Past/current experiences, and 1 comorbidity: morbid obesity is also affecting patient's functional outcome.   REHAB POTENTIAL: Good  CLINICAL DECISION MAKING: Stable/uncomplicated  EVALUATION COMPLEXITY: Low   GOALS: Goals reviewed with patient? Yes  SHORT TERM GOALS: Target date: 10/25  Pt will tolerate full aquatic sessions consistently without increase in pain and with improving function to demonstrate good toleration and effectiveness of intervention.  Baseline: Goal status: INITIAL  2.  Pt will report compliance and indep with initial HEP Baseline:  Goal status: INITIAL  3.  Pt will consider gaining pool access for use of the properties of water for chronic condition maintaining mobility and minimizing pain.  Baseline:  Goal status: INITIAL   LONG TERM GOALS: Target date: 06/03/24  Pt to improve on ODI by 15% to demonstrate statistically significant Improvement in function. (MCID 13-15%) Baseline: 26/50=52% Goal status: INITIAL  2.  Pt will report walking through grocery store without limitation to pain Baseline: unable Goal status: INITIAL  3.  Pt will report decrease in pain by at least 50% for improved toleration to activity/quality of life and to demonstrate improved management of pain. Baseline:  Goal status: INITIAL  4.  Pt will improve strength in  right hip to with in 5lbs of contralateral side to demonstrate improved overall physical function Baseline:  Goal status: INITIAL  5.  Pt will report return to regular exercise regimen for improved health Baseline:  Goal status: INITIAL  6.  Pt will be indep with final HEP's (land and aquatic as appropriate) for continued management of condition Baseline:  Goal status: INITIAL  PLAN:  PT FREQUENCY: 1-2x/week  PT DURATION: 8 weeks extended due to conflict in scheduling.  PLANNED INTERVENTIONS: 97164- PT Re-evaluation, 97750- Physical Performance Testing, 97110-Therapeutic exercises, 97530- Therapeutic activity, [id number]- Neuromuscular re-education, 97535- Self Care, 02859- Manual therapy, [phone]- Gait training, [phone]- Aquatic Therapy, [phone] (1-2 muscles), 20561 (3+ muscles)- Dry Needling, Patient/Family education, Balance training, Stair training, Taping, Joint mobilization, DME instructions, Cryotherapy, and Moist heat.  PLAN FOR NEXT SESSION: aquatic: strengthening of core and LE; balance retraining; HEP; pool access Land HEP for LB and LE stretching and strengthening.   Patrick Baxter) Patrick Baxter MPT  04/06/24 12:44 PM [facility] [address] [place], [place], [address] Phone: [phone]   Fax:  [phone]

## 2024-04-19 DIAGNOSIS — M170 Bilateral primary osteoarthritis of knee: Secondary | ICD-10-CM | POA: Diagnosis not present

## 2024-04-27 ENCOUNTER — Ambulatory Visit (HOSPITAL_BASED_OUTPATIENT_CLINIC_OR_DEPARTMENT_OTHER): Admitting: Physical Therapy

## 2024-05-02 ENCOUNTER — Ambulatory Visit: Payer: Self-pay

## 2024-05-02 ENCOUNTER — Ambulatory Visit (INDEPENDENT_AMBULATORY_CARE_PROVIDER_SITE_OTHER): Admitting: Family Medicine

## 2024-05-02 ENCOUNTER — Encounter: Payer: Self-pay | Admitting: Family Medicine

## 2024-05-02 ENCOUNTER — Ambulatory Visit: Payer: Self-pay | Admitting: Medical

## 2024-05-02 VITALS — BP 120/72 | HR 72 | Temp 99.5°F | Ht 68.0 in | Wt >= 6400 oz

## 2024-05-02 DIAGNOSIS — N390 Urinary tract infection, site not specified: Secondary | ICD-10-CM | POA: Diagnosis not present

## 2024-05-02 DIAGNOSIS — R52 Pain, unspecified: Secondary | ICD-10-CM | POA: Diagnosis not present

## 2024-05-02 DIAGNOSIS — E1165 Type 2 diabetes mellitus with hyperglycemia: Secondary | ICD-10-CM

## 2024-05-02 DIAGNOSIS — R6883 Chills (without fever): Secondary | ICD-10-CM | POA: Diagnosis not present

## 2024-05-02 DIAGNOSIS — R509 Fever, unspecified: Secondary | ICD-10-CM

## 2024-05-02 LAB — POCT URINALYSIS DIP (PROADVANTAGE DEVICE)
Bilirubin, UA: NEGATIVE
Glucose, UA: NEGATIVE mg/dL
Ketones, POC UA: NEGATIVE mg/dL
Nitrite, UA: NEGATIVE
Protein Ur, POC: 30 mg/dL — AB
Specific Gravity, Urine: 1.01
Urobilinogen, Ur: 0.2
pH, UA: 7 (ref 5.0–8.0)

## 2024-05-02 LAB — POCT INFLUENZA A/B
Influenza A, POC: NEGATIVE
Influenza B, POC: NEGATIVE

## 2024-05-02 LAB — POC COVID19 BINAXNOW: SARS Coronavirus 2 Ag: NEGATIVE

## 2024-05-02 MED ORDER — CEPHALEXIN 500 MG PO CAPS: 500.0000 mg | ORAL_CAPSULE | Freq: Four times a day (QID) | ORAL | 0 refills | Status: DC

## 2024-05-02 NOTE — Progress Notes (Signed)
 I think this was suppose to go to Dr. Randol as she saw him today correct?

## 2024-05-02 NOTE — Progress Notes (Signed)
 Chief Complaint  Patient presents with   other    Started Friday Fever, chills, body aches-Started feeling better Sunday morning then worse Sunday night- Temp of 101.6 this morning, 99.5 now after tylenol - Wants A1C checked today however has an appt with Ludie next Friday, however told him I would ask you.   10/17 he started with fatigue, thought it was due to a busy/rough week at work.  He had very slight body aches Friday night. Saturday, as the day progressed, he developed chills, body aches.  He didn't check temp then, felt feverish. He felt slight tightness in the chest, but no congestion. Only slight cough. Yesterday he felt better until mid-afternoon--achiness and fatigue recurred, alternating between feeling hot and chilled. Today he finally took his temperature, and noted fever of 101.6.  He feels weak. He is sweating a lot. He had headaches over the weekend, has a slight headache now.  He has been only taking Airborne, tylenol  and drinking Propel (diluted).  He had knee injections bilaterally a couple of weeks ago.  Noted some increased urinary frequency since those shots. Hasn't used his [employer] since he started taking Airborne (said not to use together). He had some temporary improvement in his knee pain from the injection. Denies any increased pain or redness to the knees. He tore a piece of his left great toenail off the other day (Thurs/Fri last week).  Denies purulent drainage, redness/swelling.  Using neosporin.  He denies dysuria, does note some ongoing frequency. There is a mild odor. He has had some chronic issues with infections, has been prescribed ABX from urologist, PCP, endo.  He has no other complaints.    PMH, PSH, SH reviewed  DM, gout, hypothyroid, obesity Lab Results  Component Value Date   HGBA1C 6.3 (H) 01/02/2024    Outpatient Encounter Medications as of 05/02/2024  Medication Sig Note   acetaminophen  (TYLENOL ) 325 MG tablet Take 650 mg by  mouth as needed for mild pain (pain score 1-3) or fever.    allopurinol  (ZYLOPRIM ) 300 MG tablet TAKE 1 TABLET(300 MG) BY MOUTH DAILY    B Complex-C (PROBEC-T PO) Take 1 tablet by mouth daily.    Calcium -Magnesium-Zinc 167-83-8 MG TABS Take 1 tablet by mouth daily.    cholecalciferol (VITAMIN D3) 25 MCG (1000 UNIT) tablet Take 1 tablet (1,000 Units total) by mouth daily.    colchicine  0.6 MG tablet Take 1 tablet (0.6 mg total) by mouth daily as needed (for gout). 05/02/2024: Can't remember the time he took it.   cyclobenzaprine  (FLEXERIL ) 10 MG tablet Take 1 tablet (10 mg total) by mouth 3 (three) times daily as needed for muscle spasms. 05/02/2024: Last taken last month   FLUoxetine  (PROZAC ) 40 MG capsule TAKE 1 CAPSULE(40 MG) BY MOUTH DAILY    guaiFENesin (MUCINEX PO) Take 1 tablet by mouth every 4 (four) hours as needed (for congestion). 05/02/2024: Last dose 2 months ago.   levothyroxine  (SYNTHROID ) 25 MCG tablet TAKE 1 TABLET BY MOUTH EVERY DAY BEFORE BREAKFAST    lisinopril  (ZESTRIL ) 20 MG tablet TAKE 1 TABLET(20 MG) BY MOUTH DAILY    meclizine  (ANTIVERT ) 25 MG tablet Take 1 tablet (25 mg total) by mouth 2 (two) times daily as needed for dizziness. 05/02/2024: Last dose 2 months ago   metoprolol  succinate (TOPROL -XL) 50 MG 24 hr tablet TAKE 1 TABLET BY MOUTH IMMEDIATLEY WITH OR IMMEDIATELY FOLLOWING A MEAL    montelukast  (SINGULAIR ) 10 MG tablet TAKE 1 TABLET(10 MG) BY MOUTH AT  BEDTIME    Multiple Vitamins-Minerals (MULTIVITAMIN WITH MINERALS) tablet Take 1 tablet by mouth daily.    naproxen  (NAPROSYN ) 250 MG tablet Take 250 mg by mouth 3 (three) times daily as needed for mild pain (pain score 1-3).    nitroGLYCERIN  (NITROSTAT ) 0.4 MG SL tablet Place 1 tablet (0.4 mg total) under the tongue every 5 (five) minutes as needed for chest pain. 11/30/2023: PRN   nystatin  (MYCOSTATIN /NYSTOP ) powder Apply 1 Application topically 2 (two) times daily.    Omega-3 Fatty Acids (SALMON OIL-1000) 200 MG CAPS  Take 1 capsule by mouth daily.    phenylephrine (SUDAFED PE) 10 MG TABS tablet Take 10 mg by mouth every 4 (four) hours as needed. 05/02/2024: Last dose 2 months ago   Semaglutide  (RYBELSUS ) 7 MG TABS Take 1 tablet by mouth daily.    sodium chloride  (OCEAN) 0.65 % SOLN nasal spray Place 1 spray into both nostrils as needed for congestion. 05/02/2024: Last used 2 months ago   [DISCONTINUED] furosemide  (LASIX ) 20 MG tablet Take 1 tablet (20 mg total) by mouth daily.    albuterol  (VENTOLIN  HFA) 108 (90 Base) MCG/ACT inhaler Inhale 2 puffs into the lungs every 6 (six) hours as needed for wheezing or shortness of breath. (Patient not taking: Reported on 05/02/2024) 05/02/2024: He has never used.   cetirizine (ZYRTEC) 10 MG tablet Take 10 mg by mouth daily. (Patient not taking: Reported on 05/02/2024) 05/02/2024: Last taken April or May   clotrimazole -betamethasone  (LOTRISONE ) cream Apply 1 Application topically daily. (Patient not taking: Reported on 05/02/2024) 05/02/2024: Last used in June   Coenzyme Q10 (CO Q 10 PO) Take 1 tablet by mouth daily at 6 (six) AM. (Patient not taking: Reported on 05/02/2024) 05/02/2024: Last dose 2 months ago   mupirocin  ointment (BACTROBAN ) 2 % Place 1 Application into the nose 3 (three) times daily. (Patient not taking: Reported on 05/02/2024) 05/02/2024: Last used 2 months ago   [DISCONTINUED] predniSONE  (DELTASONE ) 10 MG tablet 40mg  daily for 1 day, then 30 milligram daily for 3 days then 20 mg daily for 3 days then 10 mg daily for 3 days then 5 mg daily for 3 days then stop. (Patient not taking: Reported on 03/02/2024)    No facility-administered encounter medications on file as of 05/02/2024.   Allergies  Allergen Reactions   Atorvastatin  Other (See Comments)    myalgia   Pravastatin  Sodium Other (See Comments)    myalgia    ROS: Fever, chills, headache, myalgias per HPI.   No n/v/d No bleeding, bruising, rash (just L great toenail injury). Very slight cough,  nonproductive. No DOE, just fatigue. Denies chest pain, just slight tightness, comes/goes. Denies heartburn. Urinary symptoms per HPI +weight loss, reports intentional. See HPI   PHYSICAL EXAM:  BP 120/72   Pulse 72   Temp 99.5 F (37.5 C)   Ht 5' 8 (1.727 m)   Wt (!) 409 lb 3.2 oz (185.6 kg)   SpO2 96%   BMI 62.22 kg/m   Wt Readings from Last 3 Encounters:  05/02/24 (!) 409 lb 3.2 oz (185.6 kg)  03/02/24 (!) 426 lb (193.2 kg)  02/10/24 (!) 427 lb (193.7 kg)   Pleasant male, somewhat diaphoretic, in no distress. Doesn't sound congested, no coughing or throat-clearing during visit. HEENT: conjunctiva and sclera are clear, EOMI. TM's and EAC's normal. Nasal mucosa is normal, only mildly edematous on the left, no drainage. Sinuses nontender. OP is clear, no erythema or lesions Neck: no lymphadenopathy Heart: regular rate  and rhythm, no murmur Lungs: clear bilaterally Back: no spinal or CVA tenderness Abdomen: soft, obese.  No organomegaly or mass. Nontender throughout. Extremities: no edema, 2+ pulse.  L lateral great toenail--scabbed. No soft tissue swelling, purulent drainage.  Minimal area of erythema at the lateral aspect of the toe (without swelling), nontender. Knees could not be evaluated (wearing jeans), nontender to palpation, no asymmetry noted Psych: normal mood, affect, hygiene and grooming Neuro: alert and oriented, cranial nerves grossly intact, normal gait. Skin: no visible rashes (limited exam), diaphoretic. Toenail per above, skin is normal.   COVID, influenza A&B all negative Urine: SG 1.010, moderate blood, negative nitr, moderate leuks  Sent for culture and micro   ASSESSMENT/PLAN:  Fever, unspecified fever cause - Ddx reviewed--poss viral infection (not flu or COVID), vs related to poss UTI. No other symptoms/findings - Plan: POC COVID-19 BinaxNow, Influenza A/B, POCT Urinalysis DIP (Proadvantage Device)  Urinary tract infection without hematuria,  site unspecified - patient with urinary frequency, abnormal urinalysis, h/o UTI's, DM and fevers. Cover for UTI with keflex given prior resistance patterns - Plan: cephALEXin (KEFLEX) 500 MG capsule, Urine Culture, Urinalysis, microscopic only  Type 2 diabetes mellitus with hyperglycemia, without long-term current use of insulin  (HCC) - controlled per last A1c.  Likely had some elevated sugars related to steroid injections 2 weeks ago. To monitor sugars at home  Chills - Plan: POC COVID-19 BinaxNow, Influenza A/B  Body aches - Plan: POC COVID-19 BinaxNow, Influenza A/B   Chart reviewed--last urine culture--E.coli 12/2023 Resistant to amp, intermed to amp/sulbactam, resistant to cipro  and trimeth/sulfa Sensitive to cephalosporins and nitrofurantoin.  Treat with keflex. Await culture results. Pt has f/u scheduled next week already with PCP--to have urine rechecked then. F/u sooner prn if other ongoing/new/worsening sx develop.    Please drink plenty of fluids to stay well hydrated. Continue to use Tylenol  to keep the fever down. Get rest, and listen to your body. Please follow-up if you have persistent fever, or if you develop new symptoms (cough, shortness of breath, pain anywhere, etc). We may need to do additional evaluation if your symptoms don't improve over the next week, as would be expected if this was from a virus. I don't see any evidence of any infection on your exam today. We are checking your urine today--this showed a lot of red and white cells. We are starting you on an antibiotic to cover for infection. We are sending the urine off for culture and microscopic evaluation. If this does show an infection, you will need to follow up with your PCP and/or urologist. If the culture does NOT show an infection, we will tell you to stop the antbiotics.

## 2024-05-02 NOTE — Patient Instructions (Addendum)
 Please drink plenty of fluids to stay well hydrated. Continue to use Tylenol  to keep the fever down. Get rest, and listen to your body. Please follow-up if you have persistent fever, or if you develop new symptoms (cough, shortness of breath, pain anywhere, etc). We may need to do additional evaluation if your symptoms don't improve over the next week, as would be expected if this was from a virus. I don't see any evidence of any infection on your exam today. We are checking your urine today--this showed a lot of red and white cells. We are starting you on an antibiotic to cover for infection. We are sending the urine off for culture and microscopic evaluation. If this does show an infection, you will need to follow up with your PCP and/or urologist. If the culture does NOT show an infection, we will tell you to stop the antbiotics.   Monitor your sugars at home.  Any elevation related to the knee injections should have been short-lived.

## 2024-05-02 NOTE — Telephone Encounter (Signed)
 FYI Only or Action Required?: FYI only for provider.  Patient was last seen in primary care on 03/02/2024 by Bulah Alm RAMAN, PA-C.  Called Nurse Triage reporting Fever.  Symptoms began several days ago.  Interventions attempted: OTC medications: Tylenol , airbourne .  Symptoms are: unchanged.  Triage Disposition: See HCP Within 4 Hours (Or PCP Triage)  Patient/caregiver understands and will follow disposition?: Yes  Appt. Scheduled for 10/20**             Copied from CRM #[id number]. Topic: Clinical - Red Word Triage >> May 02, 2024  8:04 AM Amy B wrote: Red Word that prompted transfer to Nurse Triage: Fever, body aches, chills Reason for Disposition  [1] Fever > 100 F (37.8 C) AND [2] diabetes mellitus or weak immune system (e.g., HIV positive, cancer chemo, splenectomy, organ transplant, chronic steroids)  Answer Assessment - Initial Assessment Questions 1. TEMPERATURE: What is the most recent temperature?  How was it measured?      101.3   2. ONSET: When did the fever start?       Last Friday   3. CHILLS: Do you have chills? If yes: How bad are they?  (e.g., none, mild, moderate, severe)     Mild intermittently    4. OTHER SYMPTOMS: Do you have any other symptoms besides the fever?  (e.g., abdomen pain, cough, diarrhea, earache, headache, sore throat, urination pain)     Headache   5. CAUSE: If there are no symptoms, ask: What do you think is causing the fever?      Unsure   6. CONTACTS: Does anyone else in the family have an infection?     No   7. TREATMENT: What have you done so far to treat this fever? (e.g., OTC fever medicines)     Tylenol , airbourne    8. IMMUNOCOMPROMISE: Do you have any of the following: diabetes, HIV positive, splenectomy, cancer chemotherapy, chronic steroid treatment, transplant patient, etc.?   DM 2   Patient has not taken a Covid test. Appt. Scheduled for 10/20.  Protocols used: [facility]

## 2024-05-03 LAB — URINALYSIS, MICROSCOPIC ONLY
Casts: NONE SEEN /LPF
Epithelial Cells (non renal): NONE SEEN /HPF (ref 0–10)
RBC, Urine: >30 /HPF — AB (ref 0–2)
WBC, UA: >30 /HPF — AB (ref 0–5)

## 2024-05-04 ENCOUNTER — Ambulatory Visit (HOSPITAL_BASED_OUTPATIENT_CLINIC_OR_DEPARTMENT_OTHER): Admitting: Physical Therapy

## 2024-05-05 LAB — URINE CULTURE

## 2024-05-06 NOTE — Progress Notes (Signed)
 Results thru my chart

## 2024-05-08 ENCOUNTER — Other Ambulatory Visit: Payer: Self-pay | Admitting: Family Medicine

## 2024-05-08 DIAGNOSIS — N390 Urinary tract infection, site not specified: Secondary | ICD-10-CM

## 2024-05-09 ENCOUNTER — Telehealth (HOSPITAL_BASED_OUTPATIENT_CLINIC_OR_DEPARTMENT_OTHER): Payer: Self-pay | Admitting: Physical Therapy

## 2024-05-09 ENCOUNTER — Ambulatory Visit (HOSPITAL_BASED_OUTPATIENT_CLINIC_OR_DEPARTMENT_OTHER): Attending: Medical | Admitting: Physical Therapy

## 2024-05-09 DIAGNOSIS — M5459 Other low back pain: Secondary | ICD-10-CM | POA: Insufficient documentation

## 2024-05-09 DIAGNOSIS — R531 Weakness: Secondary | ICD-10-CM | POA: Insufficient documentation

## 2024-05-09 DIAGNOSIS — R262 Difficulty in walking, not elsewhere classified: Secondary | ICD-10-CM | POA: Insufficient documentation

## 2024-05-09 DIAGNOSIS — M6281 Muscle weakness (generalized): Secondary | ICD-10-CM | POA: Insufficient documentation

## 2024-05-09 DIAGNOSIS — M4316 Spondylolisthesis, lumbar region: Secondary | ICD-10-CM | POA: Insufficient documentation

## 2024-05-09 NOTE — Telephone Encounter (Signed)
 Patient did not show for [facility] appointment.  Called and left a HIPAA compliant voicemail regarding his missed appointment and requested he return phone call to confirm next upcoming appointment at [facility], [phone].   Per attendance policy: If patient has 2 occasions of late cancellation (less than 24 hr notice) or no-show, patient will be allowed to schedule one appointment at a time.  After the 3rd incident of late cancellation or no-show, patient will be discharged and require a new referral from physician to resume treatment.   Delon Aquas, PTA 05/09/24 8:25 AM [facility] [address] [place], [place], [address] Phone: [phone]   Fax:  [phone]

## 2024-05-11 ENCOUNTER — Ambulatory Visit (HOSPITAL_BASED_OUTPATIENT_CLINIC_OR_DEPARTMENT_OTHER): Admitting: Physical Therapy

## 2024-05-13 ENCOUNTER — Ambulatory Visit: Admitting: Medical

## 2024-05-13 ENCOUNTER — Telehealth: Payer: Self-pay

## 2024-05-13 VITALS — BP 124/70 | HR 85 | Wt >= 6400 oz

## 2024-05-13 DIAGNOSIS — E039 Hypothyroidism, unspecified: Secondary | ICD-10-CM | POA: Diagnosis not present

## 2024-05-13 DIAGNOSIS — R300 Dysuria: Secondary | ICD-10-CM | POA: Diagnosis not present

## 2024-05-13 DIAGNOSIS — E1165 Type 2 diabetes mellitus with hyperglycemia: Secondary | ICD-10-CM

## 2024-05-13 DIAGNOSIS — M1A9XX0 Chronic gout, unspecified, without tophus (tophi): Secondary | ICD-10-CM

## 2024-05-13 DIAGNOSIS — M109 Gout, unspecified: Secondary | ICD-10-CM | POA: Diagnosis not present

## 2024-05-13 DIAGNOSIS — E559 Vitamin D deficiency, unspecified: Secondary | ICD-10-CM | POA: Diagnosis not present

## 2024-05-13 DIAGNOSIS — E119 Type 2 diabetes mellitus without complications: Secondary | ICD-10-CM | POA: Diagnosis not present

## 2024-05-13 DIAGNOSIS — N390 Urinary tract infection, site not specified: Secondary | ICD-10-CM | POA: Diagnosis not present

## 2024-05-13 DIAGNOSIS — Z113 Encounter for screening for infections with a predominantly sexual mode of transmission: Secondary | ICD-10-CM | POA: Diagnosis not present

## 2024-05-13 DIAGNOSIS — I700 Atherosclerosis of aorta: Secondary | ICD-10-CM

## 2024-05-13 DIAGNOSIS — I10 Essential (primary) hypertension: Secondary | ICD-10-CM

## 2024-05-13 DIAGNOSIS — Z1211 Encounter for screening for malignant neoplasm of colon: Secondary | ICD-10-CM

## 2024-05-13 DIAGNOSIS — T466X5A Adverse effect of antihyperlipidemic and antiarteriosclerotic drugs, initial encounter: Secondary | ICD-10-CM

## 2024-05-13 DIAGNOSIS — M609 Myositis, unspecified: Secondary | ICD-10-CM

## 2024-05-13 DIAGNOSIS — Z23 Encounter for immunization: Secondary | ICD-10-CM

## 2024-05-13 MED ORDER — FENOFIBRATE 145 MG PO TABS: 145.0000 mg | ORAL_TABLET | Freq: Every day | ORAL | 0 refills | Status: DC

## 2024-05-13 NOTE — Telephone Encounter (Signed)
 Pt is scheduled to see BW on Monday, 05-16-24 for CPAP f/u. I can not find pt in [facility] and pt does not have a DME listed. The lov does state that pt was using his fathers machine.   I wanted to speak top pt to see if he was still using his dad's machine or where he was in the treatment process.

## 2024-05-13 NOTE — Telephone Encounter (Signed)
 Pt returned call. Pt states he is using hs own and will bring the machine and he is not sure if he has an SD card. NFN

## 2024-05-13 NOTE — Progress Notes (Signed)
 Subjective:  Patrick Baxter is a 53 y.o. male who presents for Chief Complaint  Patient presents with   Follow-up    3 month follow-up, wants to recheck urine from last weeks visit with Dr. Randol Rankin Patrick Baxter is a 53 year old male presents for a medication check.  He has been experiencing recurrent urinary tract infections over the past year, having completed several courses of antibiotics, including a recent course of Keflex finished on Sunday. Despite treatment, symptoms persist, including slight discoloration and odor in urine, though without burning or pain. He experienced a fever of 102.8F last week. A history of small kidney stones was noted on a scan in June.  He has a history of diabetes, with his last A1c in June being 6.3%. He was previously on Ozempic  but did not tolerate it well and is currently on Rybelsus . He prefers Mounjaro, which he finds more effective. He has not been checking his blood sugars regularly due to potential interference from supplements like Airborne.  His LDL cholesterol has been running around 124 mg/dL, and he has known cholesterol plaque in his aorta. He is currently not taking any medication for cholesterol but continues to use fish oil. He has allergies to some statins.  He has not had a recent eye exam and has a Cologuard kit at home for colon cancer screening, which he has not yet completed. He had COVID in the spring and has not had a recent COVID vaccine.  He mentions a change in work schedule affecting his ability to schedule medical appointments. 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  No other c/o.  Past Medical History:  Diagnosis Date   Allergy    Anxiety    Back pain    Bilateral chronic knee pain    Chronic headaches    Depression    Dermatographism    Dry skin    Fatigue    Frequent urination    Gout    Hx of echocardiogram 09/07/2019   normal LV function, EF 60-65%, no wall motion  abnormality; Dr. Michele Richardson   Hyperlipidemia    Hypertension    Joint pain    Knee pain    Obesity    Pre-diabetes    Sleep apnea    Stress    Wears glasses    Current Outpatient Medications on File Prior to Visit  Medication Sig Dispense Refill   acetaminophen  (TYLENOL ) 325 MG tablet Take 650 mg by mouth as needed for mild pain (pain score 1-3) or fever.     albuterol  (VENTOLIN  HFA) 108 (90 Base) MCG/ACT inhaler Inhale 2 puffs into the lungs every 6 (six) hours as needed for wheezing or shortness of breath. 8 g 2   allopurinol  (ZYLOPRIM ) 300 MG tablet TAKE 1 TABLET(300 MG) BY MOUTH DAILY 90 tablet 1   B Complex-C (PROBEC-T PO) Take 1 tablet by mouth daily.     Calcium -Magnesium-Zinc 167-83-8 MG TABS Take 1 tablet by mouth daily.     cholecalciferol (VITAMIN D3) 25 MCG (1000 UNIT) tablet Take 1 tablet (1,000 Units total) by mouth daily. 90 tablet 3   clotrimazole -betamethasone  (LOTRISONE ) cream Apply 1 Application topically daily. 30 g 0   colchicine  0.6 MG tablet Take 1 tablet (0.6 mg total) by mouth daily as needed (for gout).     cyclobenzaprine  (FLEXERIL ) 10 MG tablet Take 1 tablet (10 mg total) by mouth 3 (three) times  daily as needed for muscle spasms. 30 tablet 3   FLUoxetine  (PROZAC ) 40 MG capsule TAKE 1 CAPSULE(40 MG) BY MOUTH DAILY 90 capsule 0   guaiFENesin (MUCINEX PO) Take 1 tablet by mouth every 4 (four) hours as needed (for congestion).     levothyroxine  (SYNTHROID ) 25 MCG tablet TAKE 1 TABLET BY MOUTH EVERY DAY BEFORE BREAKFAST 90 tablet 3   lisinopril  (ZESTRIL ) 20 MG tablet TAKE 1 TABLET(20 MG) BY MOUTH DAILY 90 tablet 1   meclizine  (ANTIVERT ) 25 MG tablet Take 1 tablet (25 mg total) by mouth 2 (two) times daily as needed for dizziness.     metoprolol  succinate (TOPROL -XL) 50 MG 24 hr tablet TAKE 1 TABLET BY MOUTH IMMEDIATLEY WITH OR IMMEDIATELY FOLLOWING A MEAL 30 tablet 5   montelukast  (SINGULAIR ) 10 MG tablet TAKE 1 TABLET(10 MG) BY MOUTH AT BEDTIME 90 tablet 3    Multiple Vitamins-Minerals (MULTIVITAMIN WITH MINERALS) tablet Take 1 tablet by mouth daily. 90 tablet 3   naproxen  (NAPROSYN ) 250 MG tablet Take 250 mg by mouth 3 (three) times daily as needed for mild pain (pain score 1-3).     nitroGLYCERIN  (NITROSTAT ) 0.4 MG SL tablet Place 1 tablet (0.4 mg total) under the tongue every 5 (five) minutes as needed for chest pain. 30 tablet 1   Omega-3 Fatty Acids (SALMON OIL-1000) 200 MG CAPS Take 1 capsule by mouth daily.     phenylephrine (SUDAFED PE) 10 MG TABS tablet Take 10 mg by mouth every 4 (four) hours as needed.     Semaglutide  (RYBELSUS ) 7 MG TABS Take 1 tablet by mouth daily.     sodium chloride  (OCEAN) 0.65 % SOLN nasal spray Place 1 spray into both nostrils as needed for congestion.     cetirizine (ZYRTEC) 10 MG tablet Take 10 mg by mouth daily. (Patient not taking: Reported on 05/13/2024)     Coenzyme Q10 (CO Q 10 PO) Take 1 tablet by mouth daily at 6 (six) AM. (Patient not taking: Reported on 05/13/2024)     mupirocin  ointment (BACTROBAN ) 2 % Place 1 Application into the nose 3 (three) times daily. (Patient not taking: Reported on 05/02/2024) 22 g 0   nystatin  (MYCOSTATIN /NYSTOP ) powder Apply 1 Application topically 2 (two) times daily. (Patient not taking: Reported on 05/13/2024) 30 g 1   No current facility-administered medications on file prior to visit.    The following portions of the patient's history were reviewed and updated as appropriate: allergies, current medications, past family history, past medical history, past social history, past surgical history and problem list.  ROS Otherwise as in subjective above     Objective: BP 124/70   Pulse 85   Wt (!) 408 lb 6.4 oz (185.2 kg)   SpO2 97%   BMI 62.10 kg/m   General appearance: alert, no distress, well developed, well nourished Heart: RRR, normal S1, S2, no murmurs Lungs: CTA bilaterally, no wheezes, rhonchi, or rales Pulses: 2+ radial pulses, 2+ pedal pulses, normal cap  refill Ext: no edema     Assessment: Encounter Diagnoses  Name Primary?   Recurrent UTI Yes   Dysuria    Type 2 diabetes mellitus with hyperglycemia, without long-term current use of insulin  (HCC)    Need for hepatitis vaccination    Essential hypertension    Hypothyroidism, unspecified type    Chronic gout without tophus, unspecified cause, unspecified site    Colon cancer screening    Need for influenza vaccination    Need for pneumococcal 20-valent  conjugate vaccination    Aortic atherosclerosis      Plan: Recurrent UTI symptoms, recurrent he several times this year - I recommend you go back to urology for follow-up -You had a abdominal pelvis CT back in June 2025 that showed nonobstructing bilateral kidney stones that could be a nidus for infection -Labs today including STD screening for chlamydia, gonorrhea and mycoplasma to rule out other sources of infection  Diabetes -Follow-up endocrinology today -Ask about changing Rybelsus  to Mounjaro which may work more effectively for weight loss and diabetes -You are due for yearly diabetic eye exam.  Please schedule your eye exam soon  Hypothyroidism -Managed by endocrinology, follow-up with them today as planned  Hypertension -Continue lisinopril  20 mg daily -Continue Toprol -XL 50 mg daily  Screening for colon cancer -Please complete your Cologuard kit this week and turn it in  Hyperlipidemia and aortic atherosclerosis -Start trial of fenofibrate  145 mg daily -You have been unable to tolerate statins -You report that you do not want to do an injectable such as Repatha Recommendations for improving lipids:  Foods TO AVOID or limit - fried foods, high sugar foods, white bread, enriched flour, fast food, red meat, large amounts of cheese, processed foods such as little debbie cakes, cookies, pies, donuts, for example  Foods TO INCLUDE in the diet - whole grains such as whole grain pasta, whole grain bread, barley,  steel cut oatmeal (not instant oatmeal), avocado, fish, green leafy vegetables, nuts, increased fiber in diet, and using olive oil in small amounts for cooking or as salad dressing vinaigrette.    Chronic gout -Continue allopurinol  300 mg daily for maintenance  Counseled on the pneumococcal vaccine.  Pneumococcal vaccine Prevnar 20 given after consent obtained.  Counseled on the influenza virus vaccine.   Influenza vaccine given after consent obtained.    Patrick Baxter was seen today for follow-up.  Diagnoses and all orders for this visit:  Recurrent UTI -     CBC -     Comprehensive metabolic panel with GFR -     Urinalysis, Routine w reflex microscopic  Dysuria -     CBC -     Comprehensive metabolic panel with GFR -     Urinalysis, Routine w reflex microscopic  Type 2 diabetes mellitus with hyperglycemia, without long-term current use of insulin  (HCC) -     Comprehensive metabolic panel with GFR  Need for hepatitis vaccination  Essential hypertension  Hypothyroidism, unspecified type  Chronic gout without tophus, unspecified cause, unspecified site  Colon cancer screening  Need for influenza vaccination  Need for pneumococcal 20-valent conjugate vaccination  Aortic atherosclerosis  Other orders -     fenofibrate  (TRICOR ) 145 MG tablet; Take 1 tablet (145 mg total) by mouth daily.    Follow up: pending labs

## 2024-05-14 LAB — COMPREHENSIVE METABOLIC PANEL WITH GFR
ALT: 37 IU/L (ref 0–44)
AST: 36 IU/L (ref 0–40)
Albumin: 3.7 g/dL — ABNORMAL LOW (ref 3.8–4.9)
Alkaline Phosphatase: 114 IU/L (ref 47–123)
BUN/Creatinine Ratio: 24 — ABNORMAL HIGH (ref 9–20)
BUN: 19 mg/dL (ref 6–24)
Bilirubin Total: 0.4 mg/dL (ref 0.0–1.2)
CO2: 25 mmol/L (ref 20–29)
Calcium: 9 mg/dL (ref 8.7–10.2)
Chloride: 100 mmol/L (ref 96–106)
Creatinine, Ser: 0.78 mg/dL (ref 0.76–1.27)
Globulin, Total: 3.1 g/dL (ref 1.5–4.5)
Glucose: 109 mg/dL — ABNORMAL HIGH (ref 70–99)
Potassium: 4.1 mmol/L (ref 3.5–5.2)
Sodium: 138 mmol/L (ref 134–144)
Total Protein: 6.8 g/dL (ref 6.0–8.5)
eGFR: 105 mL/min/1.73 (ref >59)

## 2024-05-14 LAB — CBC
Hematocrit: 41.1 % (ref 37.5–51.0)
Hemoglobin: 13.5 g/dL (ref 13.0–17.7)
MCH: 30.3 pg (ref 26.6–33.0)
MCHC: 32.8 g/dL (ref 31.5–35.7)
MCV: 92 fL (ref 79–97)
Platelets: 177 x10E3/uL (ref 150–450)
RBC: 4.45 x10E6/uL (ref 4.14–5.80)
RDW: 13.1 % (ref 11.6–15.4)
WBC: 5.8 x10E3/uL (ref 3.4–10.8)

## 2024-05-14 LAB — URINALYSIS, ROUTINE W REFLEX MICROSCOPIC
Bilirubin, UA: NEGATIVE
Glucose, UA: NEGATIVE
Ketones, UA: NEGATIVE
Nitrite, UA: POSITIVE — AB
Specific Gravity, UA: 1.023 (ref 1.005–1.030)
Urobilinogen, Ur: 1 mg/dL (ref 0.2–1.0)
pH, UA: 5.5 (ref 5.0–7.5)

## 2024-05-14 LAB — MICROSCOPIC EXAMINATION
Casts: NONE SEEN /LPF
Epithelial Cells (non renal): NONE SEEN /HPF (ref 0–10)
WBC, UA: >30 /HPF — AB (ref 0–5)

## 2024-05-15 ENCOUNTER — Other Ambulatory Visit: Payer: Self-pay | Admitting: Medical

## 2024-05-15 ENCOUNTER — Ambulatory Visit: Payer: Self-pay | Admitting: Medical

## 2024-05-15 MED ORDER — NITROFURANTOIN MONOHYD MACRO 100 MG PO CAPS: 100.0000 mg | ORAL_CAPSULE | Freq: Two times a day (BID) | ORAL | 0 refills | Status: DC

## 2024-05-15 NOTE — Progress Notes (Signed)
 Results thru my chart

## 2024-05-16 ENCOUNTER — Ambulatory Visit: Admitting: Primary Care

## 2024-05-16 ENCOUNTER — Ambulatory Visit (HOSPITAL_BASED_OUTPATIENT_CLINIC_OR_DEPARTMENT_OTHER): Admitting: Physical Therapy

## 2024-05-16 LAB — CHLAMYDIA/GONOCOCCUS/TRICHOMONAS, NAA
Chlamydia by NAA: NEGATIVE
Gonococcus by NAA: NEGATIVE
Trich vag by NAA: NEGATIVE

## 2024-05-16 NOTE — Progress Notes (Signed)
 Please call patient about lab results from last week.  Make sure he started the Macrobid antibiotic as he is still showing urinary infection.  Make sure he calls to schedule follow-up with urology.  You are negative for chlamydia, gonorrhea and trichomonas.  Still pending mycoplasma lab

## 2024-05-16 NOTE — Addendum Note (Signed)
 Addended by: VICCI HUSBAND A on: 05/16/2024 04:13 PM   Modules accepted: Orders

## 2024-05-18 ENCOUNTER — Ambulatory Visit (HOSPITAL_BASED_OUTPATIENT_CLINIC_OR_DEPARTMENT_OTHER): Admitting: Physical Therapy

## 2024-05-18 DIAGNOSIS — N390 Urinary tract infection, site not specified: Secondary | ICD-10-CM

## 2024-05-18 DIAGNOSIS — R300 Dysuria: Secondary | ICD-10-CM

## 2024-05-20 DIAGNOSIS — N3020 Other chronic cystitis without hematuria: Secondary | ICD-10-CM | POA: Diagnosis not present

## 2024-05-20 DIAGNOSIS — R3915 Urgency of urination: Secondary | ICD-10-CM | POA: Diagnosis not present

## 2024-05-20 DIAGNOSIS — N3021 Other chronic cystitis with hematuria: Secondary | ICD-10-CM | POA: Diagnosis not present

## 2024-05-22 LAB — MYCOPLASMA / UREAPLASMA CULTURE
Mycoplasma hominis Culture: NEGATIVE
Ureaplasma urealyticum: NEGATIVE

## 2024-05-23 ENCOUNTER — Ambulatory Visit (HOSPITAL_BASED_OUTPATIENT_CLINIC_OR_DEPARTMENT_OTHER): Admitting: Physical Therapy

## 2024-05-23 NOTE — Progress Notes (Signed)
 Results through MyChart

## 2024-05-25 ENCOUNTER — Ambulatory Visit (HOSPITAL_BASED_OUTPATIENT_CLINIC_OR_DEPARTMENT_OTHER): Admitting: Physical Therapy

## 2024-05-30 ENCOUNTER — Ambulatory Visit (HOSPITAL_BASED_OUTPATIENT_CLINIC_OR_DEPARTMENT_OTHER): Admitting: Physical Therapy

## 2024-06-01 ENCOUNTER — Ambulatory Visit (HOSPITAL_BASED_OUTPATIENT_CLINIC_OR_DEPARTMENT_OTHER): Admitting: Physical Therapy

## 2024-06-06 ENCOUNTER — Ambulatory Visit (HOSPITAL_BASED_OUTPATIENT_CLINIC_OR_DEPARTMENT_OTHER): Admitting: Physical Therapy

## 2024-06-08 ENCOUNTER — Ambulatory Visit (HOSPITAL_BASED_OUTPATIENT_CLINIC_OR_DEPARTMENT_OTHER): Admitting: Physical Therapy

## 2024-06-13 ENCOUNTER — Encounter (HOSPITAL_BASED_OUTPATIENT_CLINIC_OR_DEPARTMENT_OTHER): Admitting: Physical Therapy

## 2024-06-14 DIAGNOSIS — N390 Urinary tract infection, site not specified: Secondary | ICD-10-CM

## 2024-06-14 DIAGNOSIS — R300 Dysuria: Secondary | ICD-10-CM

## 2024-06-15 ENCOUNTER — Ambulatory Visit: Admitting: Family Medicine

## 2024-06-15 ENCOUNTER — Encounter (HOSPITAL_BASED_OUTPATIENT_CLINIC_OR_DEPARTMENT_OTHER): Admitting: Physical Therapy

## 2024-06-15 ENCOUNTER — Ambulatory Visit: Payer: Self-pay

## 2024-06-15 NOTE — Progress Notes (Unsigned)
 No chief complaint on file.   Patient called yesterday with complaints of chills, body aches, fever and weakness.  Per triage notes: 'He suspects food poising last night, but is not too sure. He reports a fever of 102.9 this morning around 2:00 am. 101.8 was obtanined during the call. He stated that may not be accurate, due to his thermometer. He mentioned a headache as well. Patient stated he has reoccurring UTI's that may not be fully treated, and this has been ongoing for a while. These symptoms began yesterday. No respiratory distress noted or chest pain. He is taking Flonase for his stuffy nose, and Tylenol  for the fever. He is also seeking a new urology referral, as he is not happy with current provider. He is not currently on antibiotics for the UTI. Patient stated he needs to be seen by Urology, but cannot get an appointment with them, and someone from the office called over the their office and was able to get him in urgently, as they never answer the phone or are able to get him in for a reasonable appointment timeframe. Patient was advised he should be seen today; an appointment was offered but patient did not wish to be seen by Dr. LaLonde, and refused. He was able to be scheduled with Dr. Randol for 12/4. Patient agrees with plan of care, and will call back if anything changes, or if symptoms worsen.'  Fever noted 2 days ago.    He was last seen by me 10/20 with f/c along with urinary symptoms.  Culture showed infection with E.coli, sensitive to the Keflex  that had been prescribed. He had persistent infection, ABX changed the following week, and has referred to urology by PCP. No records from [facility] available in chart.     PMH, PSH, SH reviewed  DM, gout, hypothyroid, obesity, recurrent UTI. Under care of endo, last a1c 6.2% in 04/2024.  ROS: Fever, chills, headache, myalgias per HPI.    ***ENTER ANY URI SX?  No n/v/d No bleeding, bruising, rash  No DOE, just fatigue. Denies  chest pain, just slight tightness, comes/goes. Denies heartburn. Urinary symptoms per HPI  ***UPDATE See HPI   PHYSICAL EXAM:  There were no vitals taken for this visit.  Wt Readings from Last 3 Encounters:  05/13/24 (!) 408 lb 6.4 oz (185.2 kg)  05/02/24 (!) 409 lb 3.2 oz (185.6 kg)  03/02/24 (!) 426 lb (193.2 kg)   Pleasant male, somewhat diaphoretic, in no distress. Doesn't sound congested, no coughing or throat-clearing during visit. HEENT: conjunctiva and sclera are clear, EOMI. TM's and EAC's normal. Nasal mucosa is normal, only mildly edematous on the left, no drainage. Sinuses nontender. OP is clear, no erythema or lesions Neck: no lymphadenopathy Heart: regular rate and rhythm, no murmur Lungs: clear bilaterally Back: no spinal or CVA tenderness Abdomen: soft, obese.  No organomegaly or mass. Nontender throughout. Extremities: no edema, 2+ pulse.  Psych: normal mood, affect, hygiene and grooming Neuro: alert and oriented, cranial nerves grossly intact, normal gait. Skin: no visible rashes (limited exam), diaphoretic.   ***UPDATE ALL   COVID, influenza A&B   Urine:    ASSESSMENT/PLAN:  Needs urine dip Flu and covid if fever and any body aches, URI or GI sx  Looks like Ludie put in referral to [facility] yesterday  Has no f/u with PCP (Shane)--schedule physical whenever due

## 2024-06-15 NOTE — Telephone Encounter (Signed)
 FYI Only or Action Required?: FYI only for provider: appointment scheduled on 12/4.  Patient was last seen in primary care on 05/13/2024 by Bulah Alm RAMAN, PA-C.  Called Nurse Triage reporting Fever.  Symptoms began yesterday.  Interventions attempted: OTC medications: Flonase, Tylenol .  Symptoms are: unchanged.  Triage Disposition: See HCP Within 4 Hours (Or PCP Triage)  Patient/caregiver understands and will follow disposition?: Yes       Copied from CRM #[id number]. Topic: Clinical - Red Word Triage >> Jun 15, 2024  8:38 AM Roselie BROCKS wrote: [facility] that prompted transfer to Nurse Triage: Patient states he was diagnosed with a UTI few weeks ago, and now he is having extreme chills ,body aches and really high fever, and very weak. Reason for Disposition  Nursing judgment or information in reference  Answer Assessment - Initial Assessment Questions 1. REASON FOR CALL: What is your main concern right now?   Patient called in with complaints of chills, body aches, fever and weakness. He suspects food poising last night, but is not too sure. He reports a fever of 102.9 this morning around 2:00 am. 101.8 was obtanined during the call. He stated that may not be accurate, due to his thermometer. He mentioned a headache as well. Patient stated he has reoccurring UTI's that may not be fully treated, and this has been ongoing for a while. These symptoms began yesterday. No respiratory distress noted or chest pain. He is taking Flonase for his stuffy nose, and Tylenol  for the fever. He is also seeking a new urology referral, as he is not happy with current provider. He is not currently on antibiotics for the UTI. Patient stated he needs to be seen by Urology, but cannot get an appointment with them, and someone from the office called over the their office and was able to get him in urgently, as they never answer the phone or are able to get him in for a reasonable appointment timeframe.  Patient was advised he should be seen today; an appointment was offered but patient did not wish to be seen by Dr. LaLonde, and refused. He was able to be scheduled with Dr. Willey for 12/4. Patient agrees with plan of care, and will call back if anything changes, or if symptoms worsen.  Answer Assessment - Initial Assessment Questions 1. TEMPERATURE: What is the most recent temperature?  How was it measured?       2. ONSET: When did the fever start?       3. CHILLS: Do you have chills? If yes: How bad are they?  (e.g., none, mild, moderate, severe)      4. OTHER SYMPTOMS: Do you have any other symptoms besides the fever?  (e.g., abdomen pain, cough, diarrhea, earache, headache, sore throat, urination pain)      5. CAUSE: If there are no symptoms, ask: What do you think is causing the fever?       6. CONTACTS: Does anyone else in the family have an infection?     *No Answer* 7. TREATMENT: What have you done so far to treat this fever? (e.g., OTC fever medicines)  Protocols used: Fever-A-AH, No Guideline Available-A-AH

## 2024-06-16 ENCOUNTER — Ambulatory Visit: Admitting: Family Medicine

## 2024-06-16 ENCOUNTER — Encounter: Payer: Self-pay | Admitting: Family Medicine

## 2024-06-16 VITALS — BP 120/60 | HR 88 | Temp 97.5°F | Ht 68.0 in | Wt >= 6400 oz

## 2024-06-16 DIAGNOSIS — R52 Pain, unspecified: Secondary | ICD-10-CM | POA: Diagnosis not present

## 2024-06-16 DIAGNOSIS — N390 Urinary tract infection, site not specified: Secondary | ICD-10-CM | POA: Diagnosis not present

## 2024-06-16 DIAGNOSIS — R509 Fever, unspecified: Secondary | ICD-10-CM | POA: Diagnosis not present

## 2024-06-16 DIAGNOSIS — R197 Diarrhea, unspecified: Secondary | ICD-10-CM | POA: Diagnosis not present

## 2024-06-16 DIAGNOSIS — R6883 Chills (without fever): Secondary | ICD-10-CM

## 2024-06-16 LAB — POCT INFLUENZA A/B
Influenza A, POC: NEGATIVE
Influenza B, POC: NEGATIVE

## 2024-06-16 LAB — POCT URINALYSIS DIP (PROADVANTAGE DEVICE)
Bilirubin, UA: NEGATIVE
Glucose, UA: NEGATIVE mg/dL
Ketones, POC UA: NEGATIVE mg/dL
Nitrite, UA: NEGATIVE
Protein Ur, POC: 100 mg/dL — AB
Specific Gravity, Urine: 1.01
Urobilinogen, Ur: 0.2
pH, UA: 6 (ref 5.0–8.0)

## 2024-06-16 LAB — POC COVID19 BINAXNOW: SARS Coronavirus 2 Ag: NEGATIVE

## 2024-06-16 MED ORDER — NITROFURANTOIN MONOHYD MACRO 100 MG PO CAPS: 100.0000 mg | ORAL_CAPSULE | Freq: Two times a day (BID) | ORAL | 0 refills | Status: DC

## 2024-06-16 NOTE — Patient Instructions (Signed)
 Stay well hydrated. Avoid any dairy, and consider taking a probiotic to help get your gut flora back to normal. If you have persistent diarrhea (especially with any fever, abdominal pain), then stool studies are needed, including testing for C. Diff (you are at higher risk due to all of the recent antibiotics). Continue to eat a bland diet (BRAT--bananas, rice, applesauce and toast).  We sent in an antibiotic to cover a possible recurrent urinary tract infection. If you are truly feeling better, with no recurrent fever, you may wait to see the lab results tomorrow before starting the antibiotic  I will recommend starting it if the WBC is elevated. If labs are all normal and you are feeling better, we can wait to see what the culture shows.  Follow up with urology--the referral was placed yesterday.

## 2024-06-17 ENCOUNTER — Ambulatory Visit: Payer: Self-pay | Admitting: Family Medicine

## 2024-06-17 LAB — COMPREHENSIVE METABOLIC PANEL WITH GFR
ALT: 17 IU/L (ref 0–44)
AST: 21 IU/L (ref 0–40)
Albumin: 3.3 g/dL — AB (ref 3.8–4.9)
Alkaline Phosphatase: 102 IU/L (ref 47–123)
BUN/Creatinine Ratio: 15 (ref 9–20)
BUN: 26 mg/dL — AB (ref 6–24)
Bilirubin Total: 0.9 mg/dL (ref 0.0–1.2)
CO2: 21 mmol/L (ref 20–29)
Calcium: 9 mg/dL (ref 8.7–10.2)
Chloride: 95 mmol/L — AB (ref 96–106)
Creatinine, Ser: 1.76 mg/dL — AB (ref 0.76–1.27)
Globulin, Total: 3.1 g/dL (ref 1.5–4.5)
Glucose: 161 mg/dL — AB (ref 70–99)
Potassium: 3.9 mmol/L (ref 3.5–5.2)
Sodium: 132 mmol/L — AB (ref 134–144)
Total Protein: 6.4 g/dL (ref 6.0–8.5)
eGFR: 45 mL/min/1.73 — AB (ref >59)

## 2024-06-17 LAB — CBC WITH DIFFERENTIAL/PLATELET
Basophils Absolute: 0 x10E3/uL (ref 0.0–0.2)
Basos: 0 %
EOS (ABSOLUTE): 0 x10E3/uL (ref 0.0–0.4)
Eos: 0 %
Hematocrit: 39.5 % (ref 37.5–51.0)
Hemoglobin: 13.1 g/dL (ref 13.0–17.7)
Immature Grans (Abs): 0.1 x10E3/uL (ref 0.0–0.1)
Immature Granulocytes: 1 %
Lymphocytes Absolute: 0.8 x10E3/uL (ref 0.7–3.1)
Lymphs: 6 %
MCH: 29.7 pg (ref 26.6–33.0)
MCHC: 33.2 g/dL (ref 31.5–35.7)
MCV: 90 fL (ref 79–97)
Monocytes Absolute: 0.7 x10E3/uL (ref 0.1–0.9)
Monocytes: 5 %
Neutrophils Absolute: 13.1 x10E3/uL — ABNORMAL HIGH (ref 1.4–7.0)
Neutrophils: 88 %
Platelets: 133 x10E3/uL — ABNORMAL LOW (ref 150–450)
RBC: 4.41 x10E6/uL (ref 4.14–5.80)
RDW: 13.5 % (ref 11.6–15.4)
WBC: 14.8 x10E3/uL — ABNORMAL HIGH (ref 3.4–10.8)

## 2024-06-19 LAB — URINE CULTURE

## 2024-06-20 ENCOUNTER — Ambulatory Visit: Payer: Self-pay

## 2024-06-20 ENCOUNTER — Ambulatory Visit: Admitting: Family Medicine

## 2024-06-20 ENCOUNTER — Encounter (HOSPITAL_BASED_OUTPATIENT_CLINIC_OR_DEPARTMENT_OTHER): Admitting: Physical Therapy

## 2024-06-20 NOTE — Telephone Encounter (Signed)
 Attempt # 3 to reach patient to triage symptoms. Left VM to call back. Routing to office to reach out.

## 2024-06-20 NOTE — Telephone Encounter (Signed)
 1st attempt at calling patient back--no answer--left a voicemail for patient to call back                    Copied from CRM #[id number]. Topic: Clinical - Red Word Triage >> Jun 20, 2024  8:31 AM Amy B wrote: Red Word that prompted transfer to Nurse Triage: Sore throat, fever comes and goes, nausea

## 2024-06-21 ENCOUNTER — Ambulatory Visit: Payer: Self-pay

## 2024-06-21 ENCOUNTER — Ambulatory Visit: Admitting: Medical

## 2024-06-21 VITALS — BP 126/80 | HR 84 | Temp 97.7°F | Wt >= 6400 oz

## 2024-06-21 DIAGNOSIS — R7989 Other specified abnormal findings of blood chemistry: Secondary | ICD-10-CM

## 2024-06-21 DIAGNOSIS — D72829 Elevated white blood cell count, unspecified: Secondary | ICD-10-CM | POA: Diagnosis not present

## 2024-06-21 DIAGNOSIS — N390 Urinary tract infection, site not specified: Secondary | ICD-10-CM | POA: Diagnosis not present

## 2024-06-21 DIAGNOSIS — E1165 Type 2 diabetes mellitus with hyperglycemia: Secondary | ICD-10-CM | POA: Diagnosis not present

## 2024-06-21 LAB — CBC
Hematocrit: 37.3 % — ABNORMAL LOW (ref 37.5–51.0)
Hemoglobin: 12.1 g/dL — ABNORMAL LOW (ref 13.0–17.7)
MCH: 29.6 pg (ref 26.6–33.0)
MCHC: 32.4 g/dL (ref 31.5–35.7)
MCV: 91 fL (ref 79–97)
Platelets: 183 x10E3/uL (ref 150–450)
RBC: 4.09 x10E6/uL — ABNORMAL LOW (ref 4.14–5.80)
RDW: 13.9 % (ref 11.6–15.4)
WBC: 6.5 x10E3/uL (ref 3.4–10.8)

## 2024-06-21 NOTE — Telephone Encounter (Signed)
 FYI Only or Action Required?: FYI only for provider: appointment scheduled on 06/21/24.  Patient was last seen in primary care on 06/16/2024 by Randol Dawes, MD.  Called Nurse Triage reporting Sore Throat.  Symptoms began several days ago.  Interventions attempted: Nothing.  Symptoms are: unchanged.  Triage Disposition: See Physician Within 24 Hours  Patient/caregiver understands and will follow disposition?: Yes     Received call yesterday to call clinic. Reports only sore throat and fever, n/v/d.  Not painful irritated Reason for Disposition  SEVERE throat pain (e.g., excruciating)  Answer Assessment - Initial Assessment Questions Patient already has scheudled appt 06/21/24.  Advised keep appt and ED/911 if symptoms worsen. Patient verbalized understanding.  1. ONSET: When did the throat start hurting? (Hours or days ago)      Last week 2. SEVERITY: How bad is the sore throat? (Scale 1-10; mild, moderate or severe)     2/10; feels swollen 3. STREP EXPOSURE: Has there been any exposure to strep within the past week? If Yes, ask: What type of contact occurred?      no 4.  VIRAL SYMPTOMS: Are there any symptoms of a cold, such as a runny nose, cough, hoarse voice or red eyes?      Denies; stuffy nose 5. FEVER: Do you have a fever? If Yes, ask: What is your temperature, how was it measured, and when did it start?     Denies fever, chills, n/v/d 6. PUS ON THE TONSILS: Is there pus on the tonsils in the back of your throat?     no 7. OTHER SYMPTOMS: Do you have any other symptoms? (e.g., difficulty breathing, headache, rash)     Denies diff breathing, drooling, HA, dizziness  Protocols used: Sore Throat-A-AH

## 2024-06-21 NOTE — Progress Notes (Signed)
 Subjective:  Patrick Baxter is a 53 y.o. male who presents for Chief Complaint  Patient presents with   Follow-up    Follow-up and Dr. Randol wanted to repeat a Bmet     Patrick Baxter [name] is a 53 year old male with recurrent urinary tract infections who presents for follow-up after recent treatment.  He was seen last week here for this.    He has a history of recurrent urinary tract infections. Recent lab work showed a creatinine level of 1.76 and a GFR of 45. A urine culture was positive for E. coli, and he was prescribed Macrobid . His blood count revealed an elevated white count of 14,800 and elevated neutrophils. He reports a 60-70% improvement in symptoms but still experiences weakness and lightheadedness, which he attributes to reduced food intake. He is attempting to stay hydrated by drinking water and electrolyte drinks like Gatorade and Powerade.  He is still taking the Macrobid .  He also notes having testicle disc for an feeling hot and burning last week but that resolved.  He consulted a urologist on May 20, 2024, who suggested a bladder scan and noted incomplete bladder emptying. He was prescribed Flomax, which has somewhat alleviated leakage and nocturnal episodes, though minor daytime leakage persists. He was previously prescribed tadalafil for leakage but has not been taking it due to pharmacy issues. He is not currently on tadalafil.  He reports a persistent dry cough and some throat swelling and inflammation, which he suspects might be allergy-related. He also mentions difficulty staying hydrated, especially at night, due to nasal congestion leading to mouth breathing. He has been increasing his water intake and using cranberry D-mannose supplements.  He does not regularly check his blood sugar levels. He has a history of small kidney stones noted in a scan from June and a lung infection earlier in the year. His last A1c was 6.2 in October with endocrinology.  No recent new  sexual contacts. No pain or burning during urination but notes a sensation of pressure after urination. He experiences nasal congestion and dry mouth at night.  No other aggravating or relieving factors.    No other c/o.  Past Medical History:  Diagnosis Date   Allergy    Anxiety    Back pain    Bilateral chronic knee pain    Chronic headaches    Depression    Dermatographism    Dry skin    Fatigue    Frequent urination    Gout    Hx of echocardiogram 09/07/2019   normal LV function, EF 60-65%, no wall motion abnormality; Dr. Michele Richardson   Hyperlipidemia    Hypertension    Joint pain    Knee pain    Obesity    Pre-diabetes    Sleep apnea    Stress    Wears glasses    Current Outpatient Medications on File Prior to Visit  Medication Sig Dispense Refill   acetaminophen  (TYLENOL ) 500 MG tablet Take 1,500 mg by mouth every 6 (six) hours as needed.     albuterol  (VENTOLIN  HFA) 108 (90 Base) MCG/ACT inhaler Inhale 2 puffs into the lungs every 6 (six) hours as needed for wheezing or shortness of breath. 8 g 2   allopurinol  (ZYLOPRIM ) 300 MG tablet TAKE 1 TABLET(300 MG) BY MOUTH DAILY 90 tablet 1   B Complex-C (PROBEC-T PO) Take 1 tablet by mouth daily.     Calcium -Magnesium-Zinc 167-83-8 MG TABS Take 1 tablet by mouth daily.  cetirizine (ZYRTEC) 10 MG tablet Take 10 mg by mouth daily.     cholecalciferol (VITAMIN D3) 25 MCG (1000 UNIT) tablet Take 1 tablet (1,000 Units total) by mouth daily. 90 tablet 3   clotrimazole -betamethasone  (LOTRISONE ) cream Apply 1 Application topically daily. 30 g 0   Coenzyme Q10 (CO Q 10 PO) Take 1 tablet by mouth daily at 6 (six) AM.     colchicine  0.6 MG tablet Take 1 tablet (0.6 mg total) by mouth daily as needed (for gout).     cyclobenzaprine  (FLEXERIL ) 10 MG tablet Take 1 tablet (10 mg total) by mouth 3 (three) times daily as needed for muscle spasms. 30 tablet 3   fenofibrate  (TRICOR ) 145 MG tablet Take 1 tablet (145 mg total) by mouth  daily. 90 tablet 0   FLUoxetine  (PROZAC ) 40 MG capsule TAKE 1 CAPSULE(40 MG) BY MOUTH DAILY 90 capsule 0   fluticasone (FLONASE) 50 MCG/ACT nasal spray Place 2 sprays into both nostrils daily.     levothyroxine  (SYNTHROID ) 25 MCG tablet TAKE 1 TABLET BY MOUTH EVERY DAY BEFORE BREAKFAST 90 tablet 3   lisinopril  (ZESTRIL ) 20 MG tablet TAKE 1 TABLET(20 MG) BY MOUTH DAILY 90 tablet 1   meclizine  (ANTIVERT ) 25 MG tablet Take 1 tablet (25 mg total) by mouth 2 (two) times daily as needed for dizziness.     metoprolol  succinate (TOPROL -XL) 50 MG 24 hr tablet TAKE 1 TABLET BY MOUTH IMMEDIATLEY WITH OR IMMEDIATELY FOLLOWING A MEAL 30 tablet 5   montelukast  (SINGULAIR ) 10 MG tablet TAKE 1 TABLET(10 MG) BY MOUTH AT BEDTIME 90 tablet 3   Multiple Vitamins-Minerals (MULTIVITAMIN WITH MINERALS) tablet Take 1 tablet by mouth daily. 90 tablet 3   mupirocin  ointment (BACTROBAN ) 2 % Place 1 Application into the nose 3 (three) times daily. 22 g 0   naproxen  (NAPROSYN ) 250 MG tablet Take 250 mg by mouth 3 (three) times daily as needed for mild pain (pain score 1-3).     nitrofurantoin , macrocrystal-monohydrate, (MACROBID ) 100 MG capsule Take 1 capsule (100 mg total) by mouth 2 (two) times daily. 14 capsule 0   nitroGLYCERIN  (NITROSTAT ) 0.4 MG SL tablet Place 1 tablet (0.4 mg total) under the tongue every 5 (five) minutes as needed for chest pain. 30 tablet 1   nystatin  (MYCOSTATIN /NYSTOP ) powder Apply 1 Application topically 2 (two) times daily. 30 g 1   Omega-3 Fatty Acids (SALMON OIL-1000) 200 MG CAPS Take 1 capsule by mouth daily.     Semaglutide  (RYBELSUS ) 14 MG TABS Take 1 tablet by mouth daily.     sodium chloride  (OCEAN) 0.65 % SOLN nasal spray Place 1 spray into both nostrils as needed for congestion.     tamsulosin (FLOMAX) 0.4 MG CAPS capsule Take 0.4 mg by mouth daily.     No current facility-administered medications on file prior to visit.     The following portions of the patient's history were  reviewed and updated as appropriate: allergies, current medications, past family history, past medical history, past social history, past surgical history and problem list.  ROS Otherwise as in subjective above     Objective: BP 126/80   Pulse 84   Temp 97.7 F (36.5 C)   Wt (!) 406 lb 12.8 oz (184.5 kg)   BMI 61.85 kg/m   Wt Readings from Last 3 Encounters:  06/21/24 (!) 406 lb 12.8 oz (184.5 kg)  06/16/24 (!) 406 lb 6.4 oz (184.3 kg)  05/13/24 (!) 408 lb 6.4 oz (185.2 kg)  BP Readings from Last 3 Encounters:  06/21/24 126/80  06/16/24 120/60  05/13/24 124/70   General appearance: alert, no distress, well developed, well nourished HEENT: normocephalic, sclerae anicteric, conjunctiva pink and moist, TMs pearly, nares patent, no discharge or erythema, pharynx normal Oral cavity: somewhat MM, no lesions Neck: supple, no lymphadenopathy, no thyromegaly, no masses Heart: RRR, normal S1, S2, no murmurs Lungs: CTA bilaterally, no wheezes, rhonchi, or rales Abdomen: +bs, soft, non tender, non distended, no masses, no hepatomegaly, no splenomegaly Pulses: 2+ radial pulses, 2+ pedal pulses, normal cap refill Ext: no edema   Assessment: Encounter Diagnoses  Name Primary?   Recurrent UTI (urinary tract infection) Yes   Type 2 diabetes mellitus with hyperglycemia, without long-term current use of insulin  (HCC)    Elevated serum creatinine    Leukocytosis, unspecified type      Plan: Recurrent urinary tract infection with incomplete bladder emptying Recurrent UTIs with E. coli. Incomplete bladder emptying addressed with Flomax. Urologist recommended cranberry tablets which he is doing. - Continue Macrobid  as prescribed. - Continue Flomax (tamsulosin) 0.4 mg daily. - Follow up with urologist soon as planned - Consider bladder scan if symptoms persist. - Use cranberry tablet supplement as recommended.  Type 2 diabetes mellitus A1c at 6.2% in 04/2024 per endocrinology  notes, Dr. Braulio. Follow-up with specialist in April. - Continue current diabetes management plan. - Follow up with diabetes specialist in April.  Elevated serum creatinine Creatinine at 1.76 mg/dL, GFR 45 fO/fpw/8.26 m2.  -discontinue Lisinopril  the next 3 days as he appears a little dry still - Recheck kidney markers today. - Encouraged hydration with at least 80 ounces of water daily.  Sore throat, congestion -increase water intake -can use salt water gargles, mucinex OTC    Trevaughn [name] was seen today for follow-up.  Diagnoses and all orders for this visit:  Recurrent UTI (urinary tract infection) -     CBC  Type 2 diabetes mellitus with hyperglycemia, without long-term current use of insulin  (HCC) -     Basic metabolic panel with GFR -     CBC  Elevated serum creatinine -     Microalbumin/Creatinine Ratio, Urine -     Basic metabolic panel with GFR  Leukocytosis, unspecified type -     CBC    Follow up: pending labs

## 2024-06-22 ENCOUNTER — Ambulatory Visit: Payer: Self-pay | Admitting: Medical

## 2024-06-22 LAB — MICROALBUMIN / CREATININE URINE RATIO
Creatinine, Urine: 79.6 mg/dL
Microalb/Creat Ratio: 261 mg/g{creat} — ABNORMAL HIGH (ref 0–29)
Microalbumin, Urine: 207.5 ug/mL

## 2024-06-22 LAB — BASIC METABOLIC PANEL WITH GFR
BUN/Creatinine Ratio: 13 (ref 9–20)
BUN: 12 mg/dL (ref 6–24)
CO2: 24 mmol/L (ref 20–29)
Calcium: 9.1 mg/dL (ref 8.7–10.2)
Chloride: 100 mmol/L (ref 96–106)
Creatinine, Ser: 0.92 mg/dL (ref 0.76–1.27)
Glucose: 120 mg/dL — ABNORMAL HIGH (ref 70–99)
Potassium: 4.6 mmol/L (ref 3.5–5.2)
Sodium: 136 mmol/L (ref 134–144)
eGFR: 98 mL/min/1.73 (ref >59)

## 2024-06-22 NOTE — Progress Notes (Signed)
 Results through MyChart

## 2024-06-24 ENCOUNTER — Other Ambulatory Visit: Payer: Self-pay | Admitting: Medical

## 2024-06-24 MED ORDER — CEPHALEXIN 500 MG PO CAPS: 500.0000 mg | ORAL_CAPSULE | Freq: Three times a day (TID) | ORAL | 0 refills | Status: DC

## 2024-06-26 ENCOUNTER — Emergency Department (HOSPITAL_COMMUNITY): Admitting: Anesthesiology

## 2024-06-26 ENCOUNTER — Emergency Department (HOSPITAL_BASED_OUTPATIENT_CLINIC_OR_DEPARTMENT_OTHER)

## 2024-06-26 ENCOUNTER — Emergency Department (HOSPITAL_COMMUNITY)

## 2024-06-26 ENCOUNTER — Observation Stay (HOSPITAL_BASED_OUTPATIENT_CLINIC_OR_DEPARTMENT_OTHER)
Admission: EM | Admit: 2024-06-26 | Discharge: 2024-06-28 | DRG: 854 | Disposition: A | Attending: Internal Medicine | Admitting: Internal Medicine

## 2024-06-26 ENCOUNTER — Encounter (HOSPITAL_COMMUNITY): Admission: EM | Disposition: A | Payer: Self-pay | Source: Home / Self Care | Attending: Internal Medicine

## 2024-06-26 DIAGNOSIS — N200 Calculus of kidney: Secondary | ICD-10-CM | POA: Diagnosis present

## 2024-06-26 DIAGNOSIS — Z79890 Hormone replacement therapy (postmenopausal): Secondary | ICD-10-CM | POA: Diagnosis not present

## 2024-06-26 DIAGNOSIS — N390 Urinary tract infection, site not specified: Secondary | ICD-10-CM | POA: Diagnosis not present

## 2024-06-26 DIAGNOSIS — Z66 Do not resuscitate: Secondary | ICD-10-CM | POA: Diagnosis present

## 2024-06-26 DIAGNOSIS — B9620 Unspecified Escherichia coli [E. coli] as the cause of diseases classified elsewhere: Secondary | ICD-10-CM | POA: Diagnosis present

## 2024-06-26 DIAGNOSIS — Z743 Need for continuous supervision: Secondary | ICD-10-CM | POA: Diagnosis not present

## 2024-06-26 DIAGNOSIS — G4733 Obstructive sleep apnea (adult) (pediatric): Secondary | ICD-10-CM | POA: Diagnosis present

## 2024-06-26 DIAGNOSIS — N136 Pyonephrosis: Secondary | ICD-10-CM | POA: Diagnosis present

## 2024-06-26 DIAGNOSIS — I2510 Atherosclerotic heart disease of native coronary artery without angina pectoris: Secondary | ICD-10-CM | POA: Diagnosis present

## 2024-06-26 DIAGNOSIS — E66813 Obesity, class 3: Secondary | ICD-10-CM | POA: Diagnosis present

## 2024-06-26 DIAGNOSIS — M5441 Lumbago with sciatica, right side: Secondary | ICD-10-CM

## 2024-06-26 DIAGNOSIS — N132 Hydronephrosis with renal and ureteral calculous obstruction: Secondary | ICD-10-CM | POA: Diagnosis not present

## 2024-06-26 DIAGNOSIS — Z833 Family history of diabetes mellitus: Secondary | ICD-10-CM | POA: Diagnosis not present

## 2024-06-26 DIAGNOSIS — E119 Type 2 diabetes mellitus without complications: Secondary | ICD-10-CM | POA: Diagnosis present

## 2024-06-26 DIAGNOSIS — Z888 Allergy status to other drugs, medicaments and biological substances status: Secondary | ICD-10-CM | POA: Diagnosis not present

## 2024-06-26 DIAGNOSIS — Z1152 Encounter for screening for COVID-19: Secondary | ICD-10-CM | POA: Diagnosis not present

## 2024-06-26 DIAGNOSIS — D649 Anemia, unspecified: Secondary | ICD-10-CM | POA: Diagnosis present

## 2024-06-26 DIAGNOSIS — Z79899 Other long term (current) drug therapy: Secondary | ICD-10-CM | POA: Diagnosis not present

## 2024-06-26 DIAGNOSIS — Z87440 Personal history of urinary (tract) infections: Secondary | ICD-10-CM | POA: Diagnosis not present

## 2024-06-26 DIAGNOSIS — Z8249 Family history of ischemic heart disease and other diseases of the circulatory system: Secondary | ICD-10-CM | POA: Diagnosis not present

## 2024-06-26 DIAGNOSIS — Z87891 Personal history of nicotine dependence: Secondary | ICD-10-CM | POA: Diagnosis not present

## 2024-06-26 DIAGNOSIS — A419 Sepsis, unspecified organism: Secondary | ICD-10-CM

## 2024-06-26 DIAGNOSIS — R162 Hepatomegaly with splenomegaly, not elsewhere classified: Secondary | ICD-10-CM | POA: Diagnosis not present

## 2024-06-26 DIAGNOSIS — R531 Weakness: Secondary | ICD-10-CM | POA: Diagnosis not present

## 2024-06-26 DIAGNOSIS — R590 Localized enlarged lymph nodes: Secondary | ICD-10-CM | POA: Diagnosis not present

## 2024-06-26 DIAGNOSIS — N179 Acute kidney failure, unspecified: Secondary | ICD-10-CM | POA: Diagnosis not present

## 2024-06-26 DIAGNOSIS — N201 Calculus of ureter: Secondary | ICD-10-CM | POA: Diagnosis not present

## 2024-06-26 DIAGNOSIS — E039 Hypothyroidism, unspecified: Secondary | ICD-10-CM | POA: Diagnosis present

## 2024-06-26 DIAGNOSIS — I10 Essential (primary) hypertension: Secondary | ICD-10-CM | POA: Diagnosis present

## 2024-06-26 DIAGNOSIS — F32A Depression, unspecified: Secondary | ICD-10-CM | POA: Diagnosis present

## 2024-06-26 DIAGNOSIS — E872 Acidosis, unspecified: Secondary | ICD-10-CM | POA: Diagnosis present

## 2024-06-26 DIAGNOSIS — Z6841 Body Mass Index (BMI) 40.0 and over, adult: Secondary | ICD-10-CM | POA: Diagnosis not present

## 2024-06-26 DIAGNOSIS — E785 Hyperlipidemia, unspecified: Secondary | ICD-10-CM | POA: Diagnosis present

## 2024-06-26 DIAGNOSIS — M109 Gout, unspecified: Secondary | ICD-10-CM | POA: Diagnosis present

## 2024-06-26 DIAGNOSIS — N3001 Acute cystitis with hematuria: Secondary | ICD-10-CM | POA: Diagnosis present

## 2024-06-26 DIAGNOSIS — Z960 Presence of urogenital implants: Secondary | ICD-10-CM | POA: Diagnosis not present

## 2024-06-26 DIAGNOSIS — Z0389 Encounter for observation for other suspected diseases and conditions ruled out: Secondary | ICD-10-CM | POA: Diagnosis not present

## 2024-06-26 LAB — CBC WITH DIFFERENTIAL/PLATELET
Abs Immature Granulocytes: 0.1 K/uL — ABNORMAL HIGH (ref 0.00–0.07)
Basophils Absolute: 0.1 K/uL (ref 0.0–0.1)
Basophils Relative: 0 %
Eosinophils Absolute: 0.1 K/uL (ref 0.0–0.5)
Eosinophils Relative: 1 %
HCT: 34.6 % — ABNORMAL LOW (ref 39.0–52.0)
Hemoglobin: 11.3 g/dL — ABNORMAL LOW (ref 13.0–17.0)
Immature Granulocytes: 1 %
Lymphocytes Relative: 9 %
Lymphs Abs: 1.3 K/uL (ref 0.7–4.0)
MCH: 29.9 pg (ref 26.0–34.0)
MCHC: 32.7 g/dL (ref 30.0–36.0)
MCV: 91.5 fL (ref 80.0–100.0)
Monocytes Absolute: 1.3 K/uL — ABNORMAL HIGH (ref 0.1–1.0)
Monocytes Relative: 9 %
Neutro Abs: 11.1 K/uL — ABNORMAL HIGH (ref 1.7–7.7)
Neutrophils Relative %: 80 %
Platelets: 202 K/uL (ref 150–400)
RBC: 3.78 MIL/uL — ABNORMAL LOW (ref 4.22–5.81)
RDW: 15.1 % (ref 11.5–15.5)
WBC: 13.9 K/uL — ABNORMAL HIGH (ref 4.0–10.5)
nRBC: 0 % (ref 0.0–0.2)

## 2024-06-26 LAB — COMPREHENSIVE METABOLIC PANEL WITH GFR
ALT: 22 U/L (ref 0–44)
AST: 32 U/L (ref 15–41)
Albumin: 3 g/dL — ABNORMAL LOW (ref 3.5–5.0)
Alkaline Phosphatase: 153 U/L — ABNORMAL HIGH (ref 38–126)
Anion gap: 12 (ref 5–15)
BUN: 21 mg/dL — ABNORMAL HIGH (ref 6–20)
CO2: 25 mmol/L (ref 22–32)
Calcium: 9.5 mg/dL (ref 8.9–10.3)
Chloride: 99 mmol/L (ref 98–111)
Creatinine, Ser: 1.22 mg/dL (ref 0.61–1.24)
GFR, Estimated: >60 mL/min (ref >60)
Glucose, Bld: 127 mg/dL — ABNORMAL HIGH (ref 70–99)
Potassium: 4.5 mmol/L (ref 3.5–5.1)
Sodium: 136 mmol/L (ref 135–145)
Total Bilirubin: 0.6 mg/dL (ref 0.0–1.2)
Total Protein: 7.5 g/dL (ref 6.5–8.1)

## 2024-06-26 LAB — URINALYSIS, W/ REFLEX TO CULTURE (INFECTION SUSPECTED)
Bilirubin Urine: NEGATIVE
Glucose, UA: NEGATIVE mg/dL
Nitrite: POSITIVE — AB
Protein, ur: 100 mg/dL — AB
RBC / HPF: >50 RBC/hpf (ref 0–5)
Specific Gravity, Urine: 1.024 (ref 1.005–1.030)
WBC, UA: >50 WBC/hpf (ref 0–5)
pH: 6 (ref 5.0–8.0)

## 2024-06-26 LAB — LACTIC ACID, PLASMA
Lactic Acid, Venous: 2.7 mmol/L (ref 0.5–1.9)
Lactic Acid, Venous: 1.9 mmol/L (ref 0.5–1.9)

## 2024-06-26 LAB — RESP PANEL BY RT-PCR (RSV, FLU A&B, COVID)  RVPGX2
Influenza A by PCR: NEGATIVE
Influenza B by PCR: NEGATIVE
Resp Syncytial Virus by PCR: NEGATIVE
SARS Coronavirus 2 by RT PCR: NEGATIVE

## 2024-06-26 SURGERY — CYSTOURETEROSCOPY, WITH RETROGRADE PYELOGRAM AND STENT INSERTION
Anesthesia: General | Laterality: Bilateral

## 2024-06-26 MED ORDER — SUCCINYLCHOLINE CHLORIDE 200 MG/10ML IV SOSY
PREFILLED_SYRINGE | INTRAVENOUS | Status: AC
  Filled 2024-06-26: qty 10

## 2024-06-26 MED ORDER — MEPERIDINE HCL 25 MG/ML IJ SOLN: 6.2500 mg | INTRAMUSCULAR | Status: DC | PRN

## 2024-06-26 MED ORDER — OXYCODONE HCL 5 MG PO TABS: 5.0000 mg | ORAL_TABLET | Freq: Once | ORAL | Status: DC | PRN

## 2024-06-26 MED ORDER — ONDANSETRON HCL 4 MG/2ML IJ SOLN: 4.0000 mg | Freq: Once | INTRAMUSCULAR | Status: DC | PRN

## 2024-06-26 MED ORDER — ROCURONIUM BROMIDE 10 MG/ML (PF) SYRINGE
PREFILLED_SYRINGE | INTRAVENOUS | Status: AC
  Filled 2024-06-26: qty 10

## 2024-06-26 MED ORDER — OXYCODONE HCL 5 MG/5ML PO SOLN: 5.0000 mg | Freq: Once | ORAL | Status: DC | PRN

## 2024-06-26 MED ORDER — PROPOFOL 500 MG/50ML IV EMUL
INTRAVENOUS | Status: DC | PRN
  Administered 2024-06-26: 200 mg via INTRAVENOUS

## 2024-06-26 MED ORDER — STERILE WATER FOR IRRIGATION IR SOLN
Status: DC | PRN
  Administered 2024-06-26: 3000 mL via INTRAVESICAL

## 2024-06-26 MED ORDER — PHENYLEPHRINE 80 MCG/ML (10ML) SYRINGE FOR IV PUSH (FOR BLOOD PRESSURE SUPPORT)
PREFILLED_SYRINGE | INTRAVENOUS | Status: AC
  Filled 2024-06-26: qty 10

## 2024-06-26 MED ORDER — LACTATED RINGERS IV BOLUS
1000.0000 mL | Freq: Once | INTRAVENOUS | Status: AC
  Administered 2024-06-26: 1000 mL via INTRAVENOUS

## 2024-06-26 MED ORDER — FENTANYL CITRATE (PF) 50 MCG/ML IJ SOSY: 25.0000 ug | PREFILLED_SYRINGE | INTRAMUSCULAR | Status: DC | PRN

## 2024-06-26 MED ORDER — ONDANSETRON HCL 4 MG/2ML IJ SOLN
INTRAMUSCULAR | Status: AC
  Filled 2024-06-26: qty 2

## 2024-06-26 MED ORDER — ONDANSETRON HCL 4 MG/2ML IJ SOLN
INTRAMUSCULAR | Status: DC | PRN
  Administered 2024-06-26: 4 mg via INTRAVENOUS

## 2024-06-26 MED ORDER — SUCCINYLCHOLINE CHLORIDE 200 MG/10ML IV SOSY
PREFILLED_SYRINGE | INTRAVENOUS | Status: DC | PRN
  Administered 2024-06-26: 200 mg via INTRAVENOUS

## 2024-06-26 MED ORDER — DEXMEDETOMIDINE HCL IN NACL 80 MCG/20ML IV SOLN
INTRAVENOUS | Status: AC
  Filled 2024-06-26: qty 20

## 2024-06-26 MED ORDER — ACETAMINOPHEN 10 MG/ML IV SOLN
INTRAVENOUS | Status: DC | PRN
  Administered 2024-06-26: 1000 mg via INTRAVENOUS

## 2024-06-26 MED ORDER — FENTANYL CITRATE (PF) 100 MCG/2ML IJ SOLN
INTRAMUSCULAR | Status: DC | PRN
  Administered 2024-06-26: 100 ug via INTRAVENOUS

## 2024-06-26 MED ORDER — ACETAMINOPHEN 10 MG/ML IV SOLN
INTRAVENOUS | Status: AC
  Filled 2024-06-26: qty 100

## 2024-06-26 MED ORDER — DEXAMETHASONE SOD PHOSPHATE PF 10 MG/ML IJ SOLN
INTRAMUSCULAR | Status: DC | PRN
  Administered 2024-06-26: 4 mg via INTRAVENOUS

## 2024-06-26 MED ORDER — FENTANYL CITRATE (PF) 100 MCG/2ML IJ SOLN
INTRAMUSCULAR | Status: AC
  Filled 2024-06-26: qty 2

## 2024-06-26 MED ORDER — VASOPRESSIN 20 UNIT/ML IV SOLN
INTRAVENOUS | Status: DC | PRN
  Administered 2024-06-26 (×3): 1 [IU] via INTRAVENOUS

## 2024-06-26 MED ORDER — PHENYLEPHRINE 80 MCG/ML (10ML) SYRINGE FOR IV PUSH (FOR BLOOD PRESSURE SUPPORT)
PREFILLED_SYRINGE | INTRAVENOUS | Status: DC | PRN
  Administered 2024-06-26: 80 ug via INTRAVENOUS
  Administered 2024-06-26: 40 ug via INTRAVENOUS

## 2024-06-26 MED ORDER — IOHEXOL 300 MG/ML  SOLN
INTRAMUSCULAR | Status: DC | PRN
  Administered 2024-06-26: 50 mL via URETHRAL

## 2024-06-26 MED ORDER — DEXMEDETOMIDINE HCL IN NACL 80 MCG/20ML IV SOLN
INTRAVENOUS | Status: DC | PRN
  Administered 2024-06-26 (×6): 4 ug via INTRAVENOUS

## 2024-06-26 MED ORDER — SODIUM CHLORIDE 0.9 % IV SOLN: INTRAVENOUS | Status: DC | PRN

## 2024-06-26 MED ORDER — LACTATED RINGERS IV SOLN: INTRAVENOUS | Status: AC

## 2024-06-26 MED ORDER — IOHEXOL 300 MG/ML  SOLN
125.0000 mL | Freq: Once | INTRAMUSCULAR | Status: AC | PRN
  Administered 2024-06-26: 125 mL via INTRAVENOUS

## 2024-06-26 MED ORDER — ROCURONIUM BROMIDE 100 MG/10ML IV SOLN
INTRAVENOUS | Status: DC | PRN
  Administered 2024-06-26: 5 mg via INTRAVENOUS

## 2024-06-26 MED ORDER — SODIUM CHLORIDE 0.9 % IV SOLN
2.0000 g | Freq: Once | INTRAVENOUS | Status: AC
  Administered 2024-06-26: 2 g via INTRAVENOUS
  Filled 2024-06-26: qty 20

## 2024-06-26 MED ORDER — VASOPRESSIN 20 UNIT/ML IV SOLN
INTRAVENOUS | Status: AC
  Filled 2024-06-26: qty 1

## 2024-06-26 MED ORDER — LIDOCAINE HCL (PF) 2 % IJ SOLN
INTRAMUSCULAR | Status: AC
  Filled 2024-06-26: qty 5

## 2024-06-26 SURGICAL SUPPLY — 21 items
BAG COUNTER SPONGE SURGICOUNT (BAG) IMPLANT
BAG URO CATCHER STRL LF (MISCELLANEOUS) ×1 IMPLANT
BASKET ZERO TIP NITINOL 2.4FR (BASKET) IMPLANT
CATH URETL OPEN END 6FR 70 (CATHETERS) IMPLANT
CLOTH BEACON ORANGE TIMEOUT ST (SAFETY) ×1 IMPLANT
GLOVE BIOGEL PI IND STRL 7.5 (GLOVE) ×1 IMPLANT
GOWN STRL REUS W/ TWL XL LVL3 (GOWN DISPOSABLE) ×1 IMPLANT
GUIDEWIRE STR DUAL SENSOR (WIRE) ×1 IMPLANT
GUIDEWIRE ZIPWRE .038 STRAIGHT (WIRE) IMPLANT
IV NS 1000ML BAXH (IV SOLUTION) ×1 IMPLANT
KIT TURNOVER KIT A (KITS) ×1 IMPLANT
LASER FIB FLEXIVA PULSE ID 365 (Laser) IMPLANT
MANIFOLD NEPTUNE II (INSTRUMENTS) ×1 IMPLANT
PACK CYSTO (CUSTOM PROCEDURE TRAY) ×1 IMPLANT
SHEATH NAVIGATOR HD 12/14X36 (SHEATH) IMPLANT
STENT PERCUFLEX 4.8FRX26 (STENTS) IMPLANT
STENT URET 6FRX26 CONTOUR (STENTS) IMPLANT
TRACTIP FLEXIVA PULS ID 200XHI (Laser) IMPLANT
TRAY FOLEY MTR SLVR 16FR STAT (SET/KITS/TRAYS/PACK) IMPLANT
TUBING CONNECTING 10 (TUBING) ×1 IMPLANT
TUBING UROLOGY SET (TUBING) ×1 IMPLANT

## 2024-06-26 NOTE — Op Note (Signed)
 Date of procedure: 06/26/2024  Preoperative diagnosis:  Right proximal ureteral stone Left proximal ureteral stone Sepsis from urinary source  Postoperative diagnosis:  Same  Procedure: Cystoscopy Right ureteral stent placement Left ureteral stent placement Left retrograde pyelogram with intraoperative interpretation  Surgeon: Redell Burnet, MD  Anesthesia: General  Complications: None  Intraoperative findings:  Normal-appearing bladder, ureteral orifices orthotopic bilaterally Retained contrast on the right side from prior CT Bilateral ureteral stent placement  EBL: None  Specimens: None  Drains: Bilateral 4.8 French by 26 cm ureteral stents  Indication: Patrick Baxter is a 53 y.o. patient with fevers and chills, concern for sepsis from urinary source with CT concerning for bilateral proximal ureteral stones.  After reviewing the management options for treatment, they elected to proceed with the above surgical procedure(s). We have discussed the potential benefits and risks of the procedure, side effects of the proposed treatment, the likelihood of the patient achieving the goals of the procedure, and any potential problems that might occur during the procedure or recuperation. Informed consent has been obtained.  Description of procedure:  The patient was taken to the operating room and general anesthesia was induced. SCDs were placed for DVT prophylaxis.. The patient was placed in the dorsal lithotomy position, prepped and draped in the usual sterile fashion, and preoperative antibiotics(ceftriaxone  in ER) were administered. A preoperative time-out was performed.   A 21 French rigid cystoscope was used to intubate the urethra and a normal-appearing urethra was followed proximally to the bladder.  Prostate was small to moderate in size.  Thorough cystoscopy showed no suspicious lesions and mild bladder erythema consistent with infection.  Ureteral orifices were orthotopic  bilaterally.  A sensor wire advanced easily into the right ureteral orifice and passed up to the kidney under fluoroscopic vision.  There was retained contrast from the recent CT in the right kidney showing moderate hydronephrosis.  The sensor wire advanced alongside the stone into the upper pole.  I attempted to advance a 6 French by 26 cm ureteral stent but met significant resistance at the proximal ureteral stone and was unable to advance the stent alongside the stone.  I changed to a 4.8 French by 26 cm ureteral stent and was able to advance this with an excellent curl in the renal pelvis as well as in the bladder.  Purulent urine drained through the side ports of the stent.  I turned my attention to the left side and a sensor wire advanced up into the left kidney, there was no retained contrast noted on the left side.  5 French access catheter was advanced over the wire up into the kidney, and a retrograde pyelogram showed no significant hydronephrosis.  The wire was replaced and a 4.8 French by 26 cm ureteral stent was placed uneventfully, there was a shepherd's hook in the upper pole.  A 16 French Foley passed easily in the bladder with return of clear fluid, and 10 mL were placed in the balloon, catheter was connected to drainage  Disposition: Stable to PACU  Plan: Will need follow-up outpatient ureteroscopy, laser lithotripsy, stent placement in 3 to 4 weeks for definitive management of bilateral stones, will coordinate at [facility]  Redell Burnet, MD

## 2024-06-26 NOTE — Consult Note (Signed)
 Urology Consult   I have been asked to see the patient by Dr. Jerrol, for evaluation and management of sepsis from urinary source, right proximal ureteral stone, possible left proximal ureteral stone.  Chief Complaint: fever  HPI:  Patrick Baxter is a 53 y.o. with PMH notable for morbid obesity BMI of 62, recurrent UTI, CAD, sleep apnea who presents with fever, chills, body aches.  He denies any flank pain or dysuria, no gross hematuria.  Reportedly has had at least a year of recurrent urinary infections with primary symptoms of fever/chills/aches.  CT abdomen and pelvis notable for 5 mm right proximal ureteral stone with hydronephrosis, possible 2 mm left proximal ureteral stone without significant hydronephrosis.  Urinalysis grossly infected, leukocytosis to 14K, creatinine 1.22, eGFR greater than 60, lactate elevated 2.7.  He denies any chest pain or shortness of breath.  PMH: Past Medical History:  Diagnosis Date   Allergy    Anxiety    Back pain    Bilateral chronic knee pain    Chronic headaches    Depression    Dermatographism    Dry skin    Fatigue    Frequent urination    Gout    Hx of echocardiogram 09/07/2019   normal LV function, EF 60-65%, no wall motion abnormality; Dr. Michele Richardson   Hyperlipidemia    Hypertension    Joint pain    Knee pain    Obesity    Pre-diabetes    Sleep apnea    Stress    Wears glasses     Surgical History: Past Surgical History:  Procedure Laterality Date   IR INJECT/THERA/INC NEEDLE/CATH/PLC EPI/LUMB/SAC [facility]  09/14/2020   IR INJECT/THERA/INC NEEDLE/CATH/PLC EPI/LUMB/SAC [facility]  09/14/2020   KNEE SURGERY  11/2006   right knee arthroscopy and meniscal repair, Dr. Rendell      Allergies: Allergies[1]  Family History: Family History  Problem Relation Age of Onset   Alzheimer's disease Mother    Heart disease Mother        25, angioplasty, CAD   Cancer Mother        breast   Stroke Mother    Hypertension Mother     Hyperlipidemia Mother    Heart disease Father        angina   Arthritis Father    Cancer Maternal Grandmother        leukemia   Diabetes Paternal Grandmother     Social History:  reports that he has quit smoking. His smoking use included cigarettes. He has never used smokeless tobacco. He reports current alcohol use of about 3.0 standard drinks of alcohol per week. He reports that he does not use drugs.  Physical Exam: BP 114/60   Pulse 94   Temp 98.3 F (36.8 C) (Oral)   Resp 18   SpO2 99%    Constitutional:  Alert and oriented, No acute distress. Cardiovascular: Regular rate and rhythm Respiratory: Clear to auscultation bilaterally GI: Abdomen is soft, nontender, nondistended, no abdominal masses   Laboratory Data: Reviewed, see HPI  Pertinent Imaging: I have personally reviewed the CT abdomen and pelvis showing a 5 mm right proximal ureteral stone with hydronephrosis, possible 2 mm left proximal ureteral stone without hydronephrosis.  Assessment & Plan:   53 year old male with morbid obesity and BMI of 62, CAD, recurrent UTI, sleep apnea who presents with right proximal ureteral stone, possible left proximal ureteral stone, and sepsis from urinary source.  We discussed the need for drainage  in the setting of an infected and obstructed system.  A ureteral stent is a small plastic tube that is placed cystoscopically with one end in the kidney and the other end in the bladder that allows the infection from the kidney to drain, and relieves pain from the obstructing stone.  We discussed the risks at length including bleeding, infection, sepsis, death, ureteral injury, and stent related symptoms including urgency/frequency/dysuria/flank pain/gross hematuria.  There is a low, but not 0, risk of inability to pass the ureteral stent alongside the stone from below which would require percutaneous nephrostomy tube by interventional radiology.  Finally, we discussed possible prolonged  hospitalization and recovery, possible temporary Foley catheter placement, and 10 to 14-day course of antibiotics.  We reviewed the need for a follow-up procedure for definitive management of their stone when the infection has been treated in 2 to 3 weeks with either ureteroscopy/laser lithotripsy.  Recommendations:  - OR tonight for cystoscopy and bilateral ureteral stent placement - Continue antibiotics, hospitalist admission - Will need outpatient bilateral ureteroscopy/laser lithotripsy/stent change in 2 to 4 weeks after infection treated  [name], MD  Total time spent on the floor was 45 minutes, with greater than 50% spent in counseling and coordination of care with the patient regarding bilateral ureteral stones, sepsis for urinary source, need for ureteral stent placement with delayed management of stones after infection treated  [facility] [address] [place], [place] [address] [phone]      [1]  Allergies Allergen Reactions   Atorvastatin  Other (See Comments)    myalgia   Pravastatin  Sodium Other (See Comments)    myalgia

## 2024-06-26 NOTE — Interval H&P Note (Signed)
 History and Physical Interval Note:  06/26/2024 9:49 PM  Patrick Baxter  has presented today for surgery, with the diagnosis of sepsis.  The various methods of treatment have been discussed with the patient and family. After consideration of risks, benefits and other options for treatment, the patient has consented to  Procedures: CYSTOURETEROSCOPY, WITH RETROGRADE PYELOGRAM AND STENT INSERTION (Bilateral) as a surgical intervention.  The patient's history has been reviewed, patient examined, no change in status, stable for surgery.  I have reviewed the patient's chart and labs.  Questions were answered to the patient's satisfaction.     [name]

## 2024-06-26 NOTE — Sepsis Progress Note (Signed)
 Elink following for sepsis protocol.

## 2024-06-26 NOTE — ED Provider Notes (Addendum)
 [place] EMERGENCY DEPARTMENT AT [facility] Provider Note   CSN: [id number] Arrival date & time: 06/26/24  1625     Patient presents with: Fever   Patrick Baxter is a 53 y.o. male.    Fever    53 year old male with medical history significant for HTN, anxiety, depression, CAD, DM2, obesity, recurrent UTIs treated with antibiotics who presents to the emergency department with a chief complaint of chills.  He has been treated by his PCP for UTI multiple times.  Denied any flank pain or dysuria.  He has been having generalized chills and bodyaches with low-grade fevers at home.  He been placed on Flomax  by his PCP for presumed stone.  States that he has been on antibiotics multiple times over the past year for UTIs and keeps having recurrent UTIs.  He denies any pain at the tip of his penis, abnormal penile discharge, no pain about the perineum.  Prior to Admission medications  Medication Sig Start Date End Date Taking? Authorizing Provider  acetaminophen  (TYLENOL ) 500 MG tablet Take 1,500 mg by mouth every 6 (six) hours as needed.    [provider]  albuterol  (VENTOLIN  HFA) 108 (90 Base) MCG/ACT inhaler Inhale 2 puffs into the lungs every 6 (six) hours as needed for wheezing or shortness of breath. 12/01/22   Tysinger, Alm RAMAN, PA-C  allopurinol  (ZYLOPRIM ) 300 MG tablet TAKE 1 TABLET(300 MG) BY MOUTH DAILY 03/09/24   Tysinger, Alm RAMAN, PA-C  B Complex-C (PROBEC-T PO) Take 1 tablet by mouth daily.    [provider]  Calcium -Magnesium-Zinc [phone] MG TABS Take 1 tablet by mouth daily.    [provider]  cephALEXin  (KEFLEX ) 500 MG capsule Take 1 capsule (500 mg total) by mouth 3 (three) times daily. 06/24/24   Tysinger, Alm RAMAN, PA-C  cetirizine (ZYRTEC) 10 MG tablet Take 10 mg by mouth daily.    [provider]  cholecalciferol (VITAMIN D3) 25 MCG (1000 UNIT) tablet Take 1 tablet (1,000 Units total) by mouth daily. 12/01/22   Tysinger, Alm RAMAN,  PA-C  clotrimazole -betamethasone  (LOTRISONE ) cream Apply 1 Application topically daily. 12/11/23   Tysinger, Alm RAMAN, PA-C  Coenzyme Q10 (CO Q 10 PO) Take 1 tablet by mouth daily at 6 (six) AM.    [provider]  colchicine  0.6 MG tablet Take 1 tablet (0.6 mg total) by mouth daily as needed (for gout). 01/07/24   Cheryle Page, MD  cyclobenzaprine  (FLEXERIL ) 10 MG tablet Take 1 tablet (10 mg total) by mouth 3 (three) times daily as needed for muscle spasms. 01/01/24   Early, Sara E, NP  fenofibrate  (TRICOR ) 145 MG tablet Take 1 tablet (145 mg total) by mouth daily. 05/13/24 05/13/25  Tysinger, Alm RAMAN, PA-C  FLUoxetine  (PROZAC ) 40 MG capsule TAKE 1 CAPSULE(40 MG) BY MOUTH DAILY 03/07/24   Tysinger, Alm RAMAN, PA-C  fluticasone [facility]) 50 MCG/ACT nasal spray Place 2 sprays into both nostrils daily.    [provider]  levothyroxine  (SYNTHROID ) 25 MCG tablet TAKE 1 TABLET BY MOUTH EVERY DAY BEFORE BREAKFAST 12/14/23   Tysinger, Alm RAMAN, PA-C  lisinopril  (ZESTRIL ) 20 MG tablet TAKE 1 TABLET(20 MG) BY MOUTH DAILY 03/15/24   Tysinger, Alm RAMAN, PA-C  meclizine  (ANTIVERT ) 25 MG tablet Take 1 tablet (25 mg total) by mouth 2 (two) times daily as needed for dizziness. 01/07/24   Cheryle Page, MD  metoprolol  succinate (TOPROL -XL) 50 MG 24 hr tablet TAKE 1 TABLET BY MOUTH IMMEDIATELY WITH OR IMMEDIATELY FOLLOWING A MEAL  06/24/24   Tysinger, Alm RAMAN, PA-C  montelukast  (SINGULAIR ) 10 MG tablet TAKE 1 TABLET(10 MG) BY MOUTH AT BEDTIME 01/04/24   Tysinger, Alm RAMAN, PA-C  Multiple Vitamins-Minerals (MULTIVITAMIN WITH MINERALS) tablet Take 1 tablet by mouth daily. 12/01/22   Tysinger, Alm RAMAN, PA-C  mupirocin  ointment (BACTROBAN ) 2 % Place 1 Application into the nose 3 (three) times daily. 02/01/24   Tysinger, Alm RAMAN, PA-C  naproxen  (NAPROSYN ) 250 MG tablet Take 250 mg by mouth 3 (three) times daily as needed for mild pain (pain score 1-3).    [provider]  nitrofurantoin ,  macrocrystal-monohydrate, (MACROBID ) 100 MG capsule Take 1 capsule (100 mg total) by mouth 2 (two) times daily. 06/16/24   Randol Dawes, MD  nitroGLYCERIN  (NITROSTAT ) 0.4 MG SL tablet Place 1 tablet (0.4 mg total) under the tongue every 5 (five) minutes as needed for chest pain. 08/30/19 06/21/24  Tolia, Sunit, DO  nystatin  (MYCOSTATIN /NYSTOP ) powder Apply 1 Application topically 2 (two) times daily. 12/11/23   Tysinger, Alm RAMAN, PA-C  Omega-3 Fatty Acids (SALMON OIL-1000) 200 MG CAPS Take 1 capsule by mouth daily.    [provider]  Semaglutide  (RYBELSUS ) 14 MG TABS Take 1 tablet by mouth daily.    [provider]  sodium chloride  (OCEAN) 0.65 % SOLN nasal spray Place 1 spray into both nostrils as needed for congestion.    [provider]  tamsulosin  (FLOMAX ) 0.4 MG CAPS capsule Take 0.4 mg by mouth daily.    [provider]    Allergies: Atorvastatin  and Pravastatin  sodium    Review of Systems  Constitutional:  Positive for fever.  All other systems reviewed and are negative.   Updated Vital Signs BP (!) 100/51 (BP Location: Right Wrist)   Pulse 93   Temp 99.7 F (37.6 C)   Resp 12   SpO2 98%   Physical Exam Vitals and nursing note reviewed. Exam conducted with a chaperone present.  Constitutional:      General: He is not in acute distress.    Appearance: He is well-developed. He is obese.  HENT:     Head: Normocephalic and atraumatic.  Eyes:     Conjunctiva/sclera: Conjunctivae normal.  Cardiovascular:     Rate and Rhythm: Regular rhythm. Tachycardia present.  Pulmonary:     Effort: Pulmonary effort is normal. No respiratory distress.     Breath sounds: Normal breath sounds.  Abdominal:     Palpations: Abdomen is soft.     Tenderness: There is no abdominal tenderness.  Genitourinary:    Comments: No evidence for Fournier's gangrene, rectal exam performed with slightly boggy and tender prostate Musculoskeletal:        General: No swelling.      Cervical back: Neck supple.  Skin:    General: Skin is warm and dry.     Capillary Refill: Capillary refill takes less than 2 seconds.  Neurological:     Mental Status: He is alert.  Psychiatric:        Mood and Affect: Mood normal.     (all labs ordered are listed, but only abnormal results are displayed) Labs Reviewed  LACTIC ACID, PLASMA - Abnormal; Notable for the following components:      Result Value   Lactic Acid, Venous 2.7 (*)    All other components within normal limits  COMPREHENSIVE METABOLIC PANEL WITH GFR - Abnormal; Notable for the following components:   Glucose, Bld 127 (*)    BUN 21 (*)  Albumin 3.0 (*)    Alkaline Phosphatase 153 (*)    All other components within normal limits  CBC WITH DIFFERENTIAL/PLATELET - Abnormal; Notable for the following components:   WBC 13.9 (*)    RBC 3.78 (*)    Hemoglobin 11.3 (*)    HCT 34.6 (*)    Neutro Abs 11.1 (*)    Monocytes Absolute 1.3 (*)    Abs Immature Granulocytes 0.10 (*)    All other components within normal limits  URINALYSIS, W/ REFLEX TO CULTURE (INFECTION SUSPECTED) - Abnormal; Notable for the following components:   APPearance CLOUDY (*)    Hgb urine dipstick LARGE (*)    Ketones, ur TRACE (*)    Protein, ur 100 (*)    Nitrite POSITIVE (*)    Leukocytes,Ua LARGE (*)    Bacteria, UA MANY (*)    Non Squamous Epithelial 0-5 (*)    All other components within normal limits  RESP PANEL BY RT-PCR (RSV, FLU A&B, COVID)  RVPGX2  CULTURE, BLOOD (ROUTINE X 2)  CULTURE, BLOOD (ROUTINE X 2)  URINE CULTURE  LACTIC ACID, PLASMA    EKG: None  Radiology: DG C-Arm 1-60 Min-No Report Result Date: 06/26/2024 Fluoroscopy was utilized by the requesting physician.  No radiographic interpretation.   CT ABDOMEN PELVIS W CONTRAST Result Date: 06/26/2024 EXAM: CT ABDOMEN AND PELVIS WITH CONTRAST 06/26/2024 07:32:20 PM TECHNIQUE: CT of the abdomen and pelvis was performed with the administration of 125 mL of  iohexol  (OMNIPAQUE ) 300 MG/ML solution. Multiplanar reformatted images are provided for review. Automated exposure control, iterative reconstruction, and/or weight-based adjustment of the mA/kV was utilized to reduce the radiation dose to as low as reasonably achievable. COMPARISON: CT abdomen and pelvis 01/02/2024. CLINICAL HISTORY: Sepsis. FINDINGS: LOWER CHEST: No acute abnormality. LIVER: The liver is moderately enlarged, unchanged. GALLBLADDER AND BILE DUCTS: Gallbladder is unremarkable. No biliary ductal dilatation. SPLEEN: The spleen is moderately enlarged, unchanged. PANCREAS: No acute abnormality. ADRENAL GLANDS: No acute abnormality. KIDNEYS, URETERS AND BLADDER: There is a 5 mm calculus in the proximal right ureter. There is mild right-sided hydronephrosis. There are additional punctate nonobstructing right renal calculi. There is a questionable 2 mm calculus in the proximal left ureter. There is no left-sided hydronephrosis. There is mild nonspecific bilateral perinephric fat stranding. Urinary bladder is unremarkable. GI AND BOWEL: Stomach demonstrates no acute abnormality. The appendix appears normal. There is no bowel obstruction. PERITONEUM AND RETROPERITONEUM: No ascites. No free air. VASCULATURE: Aorta is normal in caliber. LYMPH NODES: There are enlarged periportal lymph nodes measuring up to 12 mm short axis which have slightly increased in size. REPRODUCTIVE ORGANS: No acute abnormality. BONES AND SOFT TISSUES: Severe degenerative changes of the spine. No acute osseous abnormality. No focal soft tissue abnormality. IMPRESSION: 1. 5 mm calculus in the proximal right ureter with mild right-sided hydronephrosis. 2. Questionable 2 mm calculus in the proximal left ureter without left-sided hydronephrosis. 3. Mild nonspecific bilateral perinephric fat stranding. 4. Moderately enlarged liver and spleen, unchanged. 5. Enlarged periportal lymph nodes measuring up to 12 mm short axis, slightly increased  in size. Electronically signed by: Greig Pique MD 06/26/2024 07:48 PM EST RP Workstation: [id number]   DG Chest Port 1 View Result Date: 06/26/2024 CLINICAL DATA:  Questionable sepsis - evaluate for abnormality EXAM: PORTABLE CHEST 1 VIEW COMPARISON:  February 10, 2024 FINDINGS: Evaluation is limited by underpenetration. The cardiomediastinal silhouette is unchanged in contour. No pleural effusion. No pneumothorax. No acute pleuroparenchymal abnormality. IMPRESSION: No acute cardiopulmonary abnormality.  Electronically Signed   By: Corean Salter M.D.   On: 06/26/2024 18:16     .Critical Care  Performed by: Jerrol Agent, MD Authorized by: Jerrol Agent, MD   Critical care provider statement:    Critical care time (minutes):  65   Critical care was necessary to treat or prevent imminent or life-threatening deterioration of the following conditions:  Sepsis   Critical care was time spent personally by me on the following activities:  Development of treatment plan with patient or surrogate, discussions with consultants, evaluation of patient's response to treatment, examination of patient, ordering and review of laboratory studies, ordering and review of radiographic studies, ordering and performing treatments and interventions, pulse oximetry, re-evaluation of patient's condition and review of old charts   Care discussed with: admitting provider      Medications Ordered in the ED  lactated ringers  infusion ( Intravenous New Bag/Given 06/26/24 2026)  oxyCODONE  (Oxy IR/ROXICODONE ) immediate release tablet 5 mg (has no administration in time range)    Or  oxyCODONE  (ROXICODONE ) 5 MG/5ML solution 5 mg (has no administration in time range)  fentaNYL  (SUBLIMAZE ) injection 25-50 mcg (has no administration in time range)  meperidine  (DEMEROL ) injection 6.25-12.5 mg (has no administration in time range)  ondansetron  (ZOFRAN ) injection 4 mg (has no administration in time range)  lactated ringers   bolus 1,000 mL ( Intravenous Stopped 06/26/24 1855)  cefTRIAXone  (ROCEPHIN ) 2 g in sodium chloride  0.9 % 100 mL IVPB (0 g Intravenous Stopped 06/26/24 1926)  iohexol  (OMNIPAQUE ) 300 MG/ML solution 125 mL (125 mLs Intravenous Contrast Given 06/26/24 1933)  acetaminophen  (OFIRMEV ) 10 MG/ML IV (  Override pull for Anesthesia 06/26/24 2222)    Clinical Course as of 06/26/24 2322  Sun Jun 26, 2024  1847 Nitrite(!): POSITIVE [JL]  1847 Leukocytes,Ua(!): LARGE [JL]  1847 WBC, UA: >50 [JL]  1847 Bacteria, UA(!): MANY [JL]  1848 WBC(!): 13.9 [JL]  1848 Pulse Rate(!): 108 [JL]    Clinical Course User Index [JL] Jerrol Agent, MD                                 Medical Decision Making Amount and/or Complexity of Data Reviewed Labs: ordered. Decision-making details documented in ED Course. Radiology: ordered.  Risk Prescription drug management. Decision regarding hospitalization.     53 year old male with medical history significant for HTN, anxiety, depression, CAD, DM2, obesity, recurrent UTIs treated with antibiotics who presents to the emergency department with a chief complaint of chills.  He has been treated by his PCP for UTI multiple times.  Denied any flank pain or dysuria.  He has been having generalized chills and bodyaches with low-grade fevers at home.  He been placed on Flomax  by his PCP for presumed stone.  States that he has been on antibiotics multiple times over the past year for UTIs and keeps having recurrent UTIs.  He denies any pain at the tip of his penis, abnormal penile discharge, no pain about the perineum.  On arrival, the patient was afebrile, tachycardic heart rate 108, not tachypneic, hemodynamically stable, saturating 1 room air.  On exam the patient had a boggy and tender prostate, consider prostatitis, considered UTI, consider developing sepsis considered viral syndrome.  IV access was obtained the patient was administered IV fluid bolus.  Septic workup  initiated to include blood cultures.  Urinalysis was grossly positive for UTI with positive nitrites, large leukocytes, greater than 50 WBCs and  many bacteria present.  Lactic acid was initially elevated at 2.7 but improved to 1.9 following initial fluid resuscitation.  The patient was found to have a leukocytosis to 13.9, CMP without significant AKI with creatinine of 1.22.  Patient was administered IV Rocephin .  A CT abdomen pelvis was performed: IMPRESSION:  1. 5 mm calculus in the proximal right ureter with mild right-sided  hydronephrosis.  2. Questionable 2 mm calculus in the proximal left ureter without left-sided  hydronephrosis.  3. Mild nonspecific bilateral perinephric fat stranding.  4. Moderately enlarged liver and spleen, unchanged.  5. Enlarged periportal lymph nodes measuring up to 12 mm short axis, slightly  increased in size.   Patient found to have sepsis in the setting of a 5 mm calculus in the proximal right ureter and a 2 mm calculus in the proximal left ureter.  Urology was consulted, spoke with Dr. Francisca who recommended medicine admission.  Plan will be to take the patient to the OR tonight for ureteroscopy and likely stent placement.  Patient updated regarding the plan of care.  Hospitalist medicine consulted for admission, Dr. Kayla her accepting.  Prior to admission the patient was reassessed and was found to be hemodynamically stable, appears to be volume resuscitated, heart rate improved on exam and lactic acid has improved.     Final diagnoses:  Kidney stone  Sepsis, due to unspecified organism, unspecified whether acute organ dysfunction present [facility])  Acute cystitis with hematuria    ED Discharge Orders     None          Jerrol Agent, MD 06/26/24 2322    Jerrol Agent, MD 06/26/24 2322

## 2024-06-26 NOTE — Anesthesia Procedure Notes (Addendum)
 Procedure Name: Intubation Date/Time: 06/26/2024 10:05 PM  Performed by: Obadiah Reyes BROCKS, CRNAPre-anesthesia Checklist: Patient identified, Emergency Drugs available, Suction available and Patient being monitored Patient Re-evaluated:Patient Re-evaluated prior to induction Oxygen Delivery Method: Circle System Utilized Preoxygenation: Pre-oxygenation with 100% oxygen Induction Type: IV induction, Rapid sequence and Cricoid Pressure applied Ventilation: Mask ventilation without difficulty Laryngoscope Size: Miller and 2 Grade View: Grade II Tube type: Oral Tube size: 7.5 mm Number of attempts: 1 Airway Equipment and Method: Stylet and Oral airway Placement Confirmation: ETT inserted through vocal cords under direct vision, positive ETCO2 and breath sounds checked- equal and bilateral Secured at: 23 cm Tube secured with: Tape Dental Injury: Teeth and Oropharynx as per pre-operative assessment

## 2024-06-26 NOTE — Transfer of Care (Signed)
 Immediate Anesthesia Transfer of Care Note  Patient: Patrick Baxter  Procedure(s) Performed: ERNESTA, WITH RETROGRADE PYELOGRAM AND STENT INSERTION (Bilateral)  Patient Location: PACU  Anesthesia Type:General  Level of Consciousness: awake, alert , and oriented  Airway & Oxygen Therapy: Patient Spontanous Breathing and Patient connected to face mask oxygen  Post-op Assessment: Report given to RN and Post -op Vital signs reviewed and stable  Post vital signs: Reviewed and stable  Last Vitals:  Vitals Value Taken Time  BP    Temp    Pulse 100 06/26/24 22:53  Resp 21 06/26/24 22:53  SpO2 98 % 06/26/24 22:53  Vitals shown include unfiled device data.  Last Pain:  Vitals:   06/26/24 2115  TempSrc:   PainSc: 3          Complications: No notable events documented.

## 2024-06-26 NOTE — H&P (View-Only) (Signed)
 Urology Consult   I have been asked to see the patient by Dr. Jerrol, for evaluation and management of sepsis from urinary source, right proximal ureteral stone, possible left proximal ureteral stone.  Chief Complaint: fever  HPI:  Patrick Baxter is a 53 y.o. with PMH notable for morbid obesity BMI of 62, recurrent UTI, CAD, sleep apnea who presents with fever, chills, body aches.  He denies any flank pain or dysuria, no gross hematuria.  Reportedly has had at least a year of recurrent urinary infections with primary symptoms of fever/chills/aches.  CT abdomen and pelvis notable for 5 mm right proximal ureteral stone with hydronephrosis, possible 2 mm left proximal ureteral stone without significant hydronephrosis.  Urinalysis grossly infected, leukocytosis to 14K, creatinine 1.22, eGFR greater than 60, lactate elevated 2.7.  He denies any chest pain or shortness of breath.  PMH: Past Medical History:  Diagnosis Date   Allergy    Anxiety    Back pain    Bilateral chronic knee pain    Chronic headaches    Depression    Dermatographism    Dry skin    Fatigue    Frequent urination    Gout    Hx of echocardiogram 09/07/2019   normal LV function, EF 60-65%, no wall motion abnormality; Dr. Michele Richardson   Hyperlipidemia    Hypertension    Joint pain    Knee pain    Obesity    Pre-diabetes    Sleep apnea    Stress    Wears glasses     Surgical History: Past Surgical History:  Procedure Laterality Date   IR INJECT/THERA/INC NEEDLE/CATH/PLC EPI/LUMB/SAC [facility]  09/14/2020   IR INJECT/THERA/INC NEEDLE/CATH/PLC EPI/LUMB/SAC W/IMG  09/14/2020   KNEE SURGERY  11/2006   right knee arthroscopy and meniscal repair, Dr. Rendell      Allergies: Allergies[1]  Family History: Family History  Problem Relation Age of Onset   Alzheimer's disease Mother    Heart disease Mother        25, angioplasty, CAD   Cancer Mother        breast   Stroke Mother    Hypertension Mother     Hyperlipidemia Mother    Heart disease Father        angina   Arthritis Father    Cancer Maternal Grandmother        leukemia   Diabetes Paternal Grandmother     Social History:  reports that he has quit smoking. His smoking use included cigarettes. He has never used smokeless tobacco. He reports current alcohol use of about 3.0 standard drinks of alcohol per week. He reports that he does not use drugs.  Physical Exam: BP 114/60   Pulse 94   Temp 98.3 F (36.8 C) (Oral)   Resp 18   SpO2 99%    Constitutional:  Alert and oriented, No acute distress. Cardiovascular: Regular rate and rhythm Respiratory: Clear to auscultation bilaterally GI: Abdomen is soft, nontender, nondistended, no abdominal masses   Laboratory Data: Reviewed, see HPI  Pertinent Imaging: I have personally reviewed the CT abdomen and pelvis showing a 5 mm right proximal ureteral stone with hydronephrosis, possible 2 mm left proximal ureteral stone without hydronephrosis.  Assessment & Plan:   53 year old male with morbid obesity and BMI of 62, CAD, recurrent UTI, sleep apnea who presents with right proximal ureteral stone, possible left proximal ureteral stone, and sepsis from urinary source.  We discussed the need for drainage  in the setting of an infected and obstructed system.  A ureteral stent is a small plastic tube that is placed cystoscopically with one end in the kidney and the other end in the bladder that allows the infection from the kidney to drain, and relieves pain from the obstructing stone.  We discussed the risks at length including bleeding, infection, sepsis, death, ureteral injury, and stent related symptoms including urgency/frequency/dysuria/flank pain/gross hematuria.  There is a low, but not 0, risk of inability to pass the ureteral stent alongside the stone from below which would require percutaneous nephrostomy tube by interventional radiology.  Finally, we discussed possible prolonged  hospitalization and recovery, possible temporary Foley catheter placement, and 10 to 14-day course of antibiotics.  We reviewed the need for a follow-up procedure for definitive management of their stone when the infection has been treated in 2 to 3 weeks with either ureteroscopy/laser lithotripsy.  Recommendations:  - OR tonight for cystoscopy and bilateral ureteral stent placement - Continue antibiotics, hospitalist admission - Will need outpatient bilateral ureteroscopy/laser lithotripsy/stent change in 2 to 4 weeks after infection treated  [name], MD  Total time spent on the floor was 45 minutes, with greater than 50% spent in counseling and coordination of care with the patient regarding bilateral ureteral stones, sepsis for urinary source, need for ureteral stent placement with delayed management of stones after infection treated  [facility] [address] [place], [place] [address] [phone]      [1]  Allergies Allergen Reactions   Atorvastatin  Other (See Comments)    myalgia   Pravastatin  Sodium Other (See Comments)    myalgia

## 2024-06-26 NOTE — Progress Notes (Signed)
 Hospitalist Transfer Note:    Nursing staff, Please call [facility] Admits & Consults System-Wide number on Amion [phone]) as soon as patient's arrival, so appropriate admitting provider can evaluate the pt.   Transferring facility: [facility] Requesting provider: Dr. Jerrol (EDP at [facility]) Reason for transfer: admission for further evaluation and management of potential infected right ureteral stone.     53 year old male with history of recurrent urinary tract infections, who presented to [facility] ED complaining of  fever.   Vital signs in the ED were notable for the following: Temperature max 98.9; heart rates in the 90s to low 100s; systolic blood pressures in the low 100s to 130s.  Labs were notable for CBC notable for white blood cell count 13,900, urinalysis showed greater than 50 white blood cells, many bacteria, large leukocyte esterase and was positive for nitrites.  Blood cultures x 2 and urine culture were collected prior to initiation of IV antibiotics.  Initial lactate 2.7, with repeat trending down to 1.9.  Imaging notable for CT abdomen/pelvis with contrast, with findings that included a 5 mm right proximal ureteral stone with hydronephrosis.   EDP d/w on-call urology, Dr. Francisca, who requests hospitalist admission to [facility], where urology will consult and are planning for ureteral stent placement overnight.   Subsequently, I accepted this patient for transfer for observation to a med-tele bed at [facility] for further work-up and management of the above.       Eva Pore, DO Hospitalist

## 2024-06-26 NOTE — ED Notes (Signed)
 Report given to [facility] @ PACU

## 2024-06-26 NOTE — Anesthesia Preprocedure Evaluation (Addendum)
 Anesthesia Evaluation  Patient identified by MRN, date of birth, ID band Patient awake    Reviewed: Allergy & Precautions, H&P , NPO status , Patient's Chart, lab work & pertinent test results  Airway Mallampati: II  TM Distance: >3 FB Neck ROM: Full    Dental no notable dental hx. (+) Teeth Intact, Dental Advisory Given, Poor Dentition   Pulmonary sleep apnea , pneumonia, former smoker   Pulmonary exam normal breath sounds clear to auscultation       Cardiovascular Exercise Tolerance: Good hypertension, Pt. on medications and Pt. on home beta blockers Normal cardiovascular exam Rhythm:Regular Rate:Normal  ECHO 25   1. Left ventricular ejection fraction, by estimation, is 55 to 60%. The  left ventricle has normal function. The left ventricle has no regional  wall motion abnormalities. Left ventricular diastolic parameters were  normal. There is the interventricular  septum is flattened in systole, consistent with right ventricular pressure  overload.   2. Right ventricular systolic function is normal. The right ventricular  size is normal. Tricuspid regurgitation signal is inadequate for assessing  PA pressure.   3. The mitral valve is normal in structure. No evidence of mitral valve  regurgitation. No evidence of mitral stenosis.   4. The aortic valve is tricuspid. Aortic valve regurgitation is not  visualized. Aortic valve sclerosis is present, with no evidence of aortic  valve stenosis.   5. The inferior vena cava is normal in size with greater than 50%  respiratory variability, suggesting right atrial pressure of 3 mmHg.     Neuro/Psych  Headaches PSYCHIATRIC DISORDERS Anxiety Depression     Neuromuscular disease    GI/Hepatic negative GI ROS,,,  Endo/Other  diabetesHypothyroidism  Class 4 obesity  Renal/GU Renal disease  negative genitourinary   Musculoskeletal negative musculoskeletal ROS (+)    Abdominal    Peds negative pediatric ROS (+)  Hematology  (+) Blood dyscrasia, anemia   Anesthesia Other Findings   Reproductive/Obstetrics negative OB ROS                              Anesthesia Physical Anesthesia Plan  ASA: 3 and emergent  Anesthesia Plan: General   Post-op Pain Management: Ofirmev  IV (intra-op)*   Induction: Intravenous and Cricoid pressure planned  PONV Risk Score and Plan: 2 and Dexamethasone  and Treatment may vary due to age or medical condition  Airway Management Planned: Oral ETT  Additional Equipment: None  Intra-op Plan:   Post-operative Plan: Extubation in OR  Informed Consent: I have reviewed the patients History and Physical, chart, labs and discussed the procedure including the risks, benefits and alternatives for the proposed anesthesia with the patient or authorized representative who has indicated his/her understanding and acceptance.       Plan Discussed with: Anesthesiologist and CRNA  Anesthesia Plan Comments:          Anesthesia Quick Evaluation

## 2024-06-26 NOTE — ED Triage Notes (Signed)
 Reports flu like symptoms since Friday. Reports fevers, body aches, and chills, Recent antibiotic use for recurrent UTI.

## 2024-06-27 ENCOUNTER — Other Ambulatory Visit: Payer: Self-pay

## 2024-06-27 ENCOUNTER — Other Ambulatory Visit: Payer: Self-pay | Admitting: Urology

## 2024-06-27 ENCOUNTER — Encounter (HOSPITAL_COMMUNITY): Payer: Self-pay | Admitting: Internal Medicine

## 2024-06-27 DIAGNOSIS — G4733 Obstructive sleep apnea (adult) (pediatric): Secondary | ICD-10-CM | POA: Diagnosis present

## 2024-06-27 DIAGNOSIS — N179 Acute kidney failure, unspecified: Secondary | ICD-10-CM

## 2024-06-27 DIAGNOSIS — N200 Calculus of kidney: Secondary | ICD-10-CM | POA: Diagnosis present

## 2024-06-27 DIAGNOSIS — N136 Pyonephrosis: Secondary | ICD-10-CM | POA: Diagnosis present

## 2024-06-27 DIAGNOSIS — A419 Sepsis, unspecified organism: Secondary | ICD-10-CM

## 2024-06-27 DIAGNOSIS — E039 Hypothyroidism, unspecified: Secondary | ICD-10-CM | POA: Diagnosis present

## 2024-06-27 DIAGNOSIS — Z66 Do not resuscitate: Secondary | ICD-10-CM | POA: Diagnosis present

## 2024-06-27 DIAGNOSIS — M109 Gout, unspecified: Secondary | ICD-10-CM | POA: Diagnosis present

## 2024-06-27 DIAGNOSIS — E785 Hyperlipidemia, unspecified: Secondary | ICD-10-CM | POA: Diagnosis present

## 2024-06-27 DIAGNOSIS — Z79899 Other long term (current) drug therapy: Secondary | ICD-10-CM | POA: Diagnosis not present

## 2024-06-27 DIAGNOSIS — N390 Urinary tract infection, site not specified: Secondary | ICD-10-CM

## 2024-06-27 DIAGNOSIS — Z833 Family history of diabetes mellitus: Secondary | ICD-10-CM | POA: Diagnosis not present

## 2024-06-27 DIAGNOSIS — E66813 Obesity, class 3: Secondary | ICD-10-CM | POA: Diagnosis present

## 2024-06-27 DIAGNOSIS — F32A Depression, unspecified: Secondary | ICD-10-CM | POA: Diagnosis present

## 2024-06-27 DIAGNOSIS — Z79890 Hormone replacement therapy (postmenopausal): Secondary | ICD-10-CM | POA: Diagnosis not present

## 2024-06-27 DIAGNOSIS — Z6841 Body Mass Index (BMI) 40.0 and over, adult: Secondary | ICD-10-CM | POA: Diagnosis not present

## 2024-06-27 DIAGNOSIS — I2510 Atherosclerotic heart disease of native coronary artery without angina pectoris: Secondary | ICD-10-CM | POA: Diagnosis present

## 2024-06-27 DIAGNOSIS — Z8249 Family history of ischemic heart disease and other diseases of the circulatory system: Secondary | ICD-10-CM | POA: Diagnosis not present

## 2024-06-27 DIAGNOSIS — Z888 Allergy status to other drugs, medicaments and biological substances status: Secondary | ICD-10-CM | POA: Diagnosis not present

## 2024-06-27 DIAGNOSIS — E119 Type 2 diabetes mellitus without complications: Secondary | ICD-10-CM | POA: Diagnosis present

## 2024-06-27 DIAGNOSIS — B9620 Unspecified Escherichia coli [E. coli] as the cause of diseases classified elsewhere: Secondary | ICD-10-CM | POA: Diagnosis present

## 2024-06-27 DIAGNOSIS — Z87891 Personal history of nicotine dependence: Secondary | ICD-10-CM | POA: Diagnosis not present

## 2024-06-27 DIAGNOSIS — I10 Essential (primary) hypertension: Secondary | ICD-10-CM | POA: Diagnosis present

## 2024-06-27 DIAGNOSIS — E872 Acidosis, unspecified: Secondary | ICD-10-CM | POA: Diagnosis present

## 2024-06-27 DIAGNOSIS — Z1152 Encounter for screening for COVID-19: Secondary | ICD-10-CM | POA: Diagnosis not present

## 2024-06-27 DIAGNOSIS — D649 Anemia, unspecified: Secondary | ICD-10-CM | POA: Diagnosis present

## 2024-06-27 LAB — CBC
HCT: 35.5 % — ABNORMAL LOW (ref 39.0–52.0)
Hemoglobin: 11.2 g/dL — ABNORMAL LOW (ref 13.0–17.0)
MCH: 29.5 pg (ref 26.0–34.0)
MCHC: 31.5 g/dL (ref 30.0–36.0)
MCV: 93.4 fL (ref 80.0–100.0)
Platelets: 198 K/uL (ref 150–400)
RBC: 3.8 MIL/uL — ABNORMAL LOW (ref 4.22–5.81)
RDW: 15 % (ref 11.5–15.5)
WBC: 11.1 K/uL — ABNORMAL HIGH (ref 4.0–10.5)
nRBC: 0 % (ref 0.0–0.2)

## 2024-06-27 LAB — BASIC METABOLIC PANEL WITH GFR
Anion gap: 10 (ref 5–15)
BUN: 19 mg/dL (ref 6–20)
CO2: 22 mmol/L (ref 22–32)
Calcium: 9.2 mg/dL (ref 8.9–10.3)
Chloride: 101 mmol/L (ref 98–111)
Creatinine, Ser: 1 mg/dL (ref 0.61–1.24)
GFR, Estimated: >60 mL/min (ref >60)
Glucose, Bld: 184 mg/dL — ABNORMAL HIGH (ref 70–99)
Potassium: 5.1 mmol/L (ref 3.5–5.1)
Sodium: 134 mmol/L — ABNORMAL LOW (ref 135–145)

## 2024-06-27 LAB — GLUCOSE, CAPILLARY
Glucose-Capillary: 162 mg/dL — ABNORMAL HIGH (ref 70–99)
Glucose-Capillary: 205 mg/dL — ABNORMAL HIGH (ref 70–99)
Glucose-Capillary: 199 mg/dL — ABNORMAL HIGH (ref 70–99)
Glucose-Capillary: 198 mg/dL — ABNORMAL HIGH (ref 70–99)

## 2024-06-27 LAB — MRSA NEXT GEN BY PCR, NASAL: MRSA by PCR Next Gen: DETECTED — AB

## 2024-06-27 MED ORDER — FLUOXETINE HCL 20 MG PO CAPS
40.0000 mg | ORAL_CAPSULE | Freq: Every day | ORAL | Status: DC
  Administered 2024-06-27 – 2024-06-28 (×2): 40 mg via ORAL
  Filled 2024-06-27 (×2): qty 2

## 2024-06-27 MED ORDER — TAMSULOSIN HCL 0.4 MG PO CAPS
0.4000 mg | ORAL_CAPSULE | Freq: Every day | ORAL | Status: DC
  Administered 2024-06-27 – 2024-06-28 (×2): 0.4 mg via ORAL
  Filled 2024-06-27 (×2): qty 1

## 2024-06-27 MED ORDER — FENOFIBRATE 160 MG PO TABS
160.0000 mg | ORAL_TABLET | Freq: Every day | ORAL | Status: DC
  Administered 2024-06-27 – 2024-06-28 (×2): 160 mg via ORAL
  Filled 2024-06-27 (×2): qty 1

## 2024-06-27 MED ORDER — ACETAMINOPHEN 650 MG RE SUPP: 650.0000 mg | Freq: Four times a day (QID) | RECTAL | Status: DC | PRN

## 2024-06-27 MED ORDER — INSULIN ASPART 100 UNIT/ML IJ SOLN: 0.0000 [IU] | Freq: Every day | INTRAMUSCULAR | Status: DC

## 2024-06-27 MED ORDER — INSULIN ASPART 100 UNIT/ML IJ SOLN
0.0000 [IU] | Freq: Three times a day (TID) | INTRAMUSCULAR | Status: DC
  Administered 2024-06-27: 12:00:00 3 [IU] via SUBCUTANEOUS
  Administered 2024-06-27 – 2024-06-28 (×3): 2 [IU] via SUBCUTANEOUS
  Filled 2024-06-27 (×3): qty 2
  Filled 2024-06-27: qty 3

## 2024-06-27 MED ORDER — ALLOPURINOL 300 MG PO TABS
300.0000 mg | ORAL_TABLET | Freq: Every day | ORAL | Status: DC
  Administered 2024-06-27 – 2024-06-28 (×2): 300 mg via ORAL
  Filled 2024-06-27 (×2): qty 1

## 2024-06-27 MED ORDER — MONTELUKAST SODIUM 10 MG PO TABS
10.0000 mg | ORAL_TABLET | Freq: Every day | ORAL | Status: DC
  Administered 2024-06-27 (×2): 10 mg via ORAL
  Filled 2024-06-27 (×2): qty 1

## 2024-06-27 MED ORDER — LEVOTHYROXINE SODIUM 25 MCG PO TABS
25.0000 ug | ORAL_TABLET | Freq: Every day | ORAL | Status: DC
  Administered 2024-06-27 – 2024-06-28 (×2): 25 ug via ORAL
  Filled 2024-06-27 (×3): qty 1

## 2024-06-27 MED ORDER — ACETAMINOPHEN 325 MG PO TABS: 650.0000 mg | ORAL_TABLET | Freq: Four times a day (QID) | ORAL | Status: DC | PRN

## 2024-06-27 MED ORDER — SODIUM CHLORIDE 0.9 % IV SOLN
2.0000 g | INTRAVENOUS | Status: DC
  Administered 2024-06-27: 18:00:00 2 g via INTRAVENOUS
  Filled 2024-06-27: qty 20

## 2024-06-27 NOTE — Progress Notes (Signed)
 Same-day admission note.  Admitted early morning hours by nighttime hospitalist.  See H&P with assessment plan, surgical notes that is reviewed.  53 year old gentleman with history of recurrent UTIs, incomplete bladder emptying, type 2 diabetes, hypertension, sleep apnea on CPAP, hypothyroidism, obesity, anxiety and depression presented from home with fever and chills.  At the emergency room temperature 98.3, heart rate 108, blood pressure is stable.  On room air.  WBC count 13.9.  Creatinine 1.2.  COVID influenza and RSV negative.  Lactic acid 2.7-1.9.  UA was abnormal.  CT scan showed 5 mm calculus proximal right ureter with mild right-sided hydronephrosis.  Patient was resuscitated, given IV antibiotics and IV fluids and admitted to the hospital after undergoing cystoscopy and bilateral ureteric stent placement.   Infected right ureteric stone: Clinically improving.  Blood cultures and urine cultures negative so far.  Continue Rocephin .  Continue maintenance IV fluids.  Mobilize. Status post bilateral ureteric stent placement.  Will likely remove Foley catheter before discharge. Mobilize in the hallway.  Anticipate home tomorrow morning if remains stable. Able to resume all his long-term medications including using CPAP at night.  Same-day admit.  No charge visit.

## 2024-06-27 NOTE — Progress Notes (Addendum)
 1 Day Post-Op Subjective: First time meeting Mr. Jeancharles.  He feels much better today.  No acute events overnight.  Discussed the natural history of stone production and treatment at length including infographic's.  All questions answered to his satisfaction.  Objective: Vital signs in last 24 hours: Temp:  [97.5 F (36.4 C)-99.7 F (37.6 C)] 97.7 F (36.5 C) (12/15 1003) Pulse Rate:  [80-108] 89 (12/15 1003) Resp:  [12-20] 18 (12/15 1003) BP: (91-150)/(43-89) 112/56 (12/15 1003) SpO2:  [94 %-100 %] 94 % (12/15 1003) Weight:  [183.3 kg] 183.3 kg (12/15 0012)  Assessment/Plan: # Bilateral ureteral stents # Chronic urinary tract infection # Urinary retention?  It sounds like his lower urinary tract symptoms were multifactorial.  He has been struggling with nocturnal incontinence and frequent urination.  This is improved since starting Flomax  per his reports.    He continues to have UTIs and appears to have one now.  He reports that he did not take the Keflex  he was recently prescribed, suspecting that his ureteral stone symptoms were being caused by the antibiotic.  It is likely that he developed UTIs as a result of the urinary retention but has had a difficult time clearing them because of the ureteral stones.  Hopefully now that he has stents in place and will eventually have definitive stone management, he will be free of infection.  Foley out today.  Afebrile, leukocytosis is nearly resolved and her renal indices are WNL.  Will follow-up with Dr. Quilla with [facility] [place] in the next few weeks for ureteroscopy and laser lithotripsy.  Urology will sign off at this time.  Please feel free to call with questions, concerns, or acute urologic changes.  Intake/Output from previous day: 12/14 0701 - 12/15 0700 In: 1047.8 [I.V.:600; IV Piggyback:447.8] Out: 1200 [Urine:1200]  Intake/Output this shift: Total I/O In: -  Out: 1100 [Urine:1100]  Physical Exam:  General:  Alert and oriented CV: No cyanosis Lungs: equal chest rise Gu: Foley catheter in place draining clear yellow urine  Lab Results: Recent Labs    06/26/24 1804 06/27/24 0505  HGB 11.3* 11.2*  HCT 34.6* 35.5*   BMET Recent Labs    06/26/24 1804 06/27/24 0505  NA 136 134*  K 4.5 5.1  CL 99 101  CO2 25 22  GLUCOSE 127* 184*  BUN 21* 19  CREATININE 1.22 1.00  CALCIUM  9.5 9.2  HGB 11.3* 11.2*  WBC 13.9* 11.1*     Studies/Results: DG C-Arm 1-60 Min-No Report Result Date: 06/26/2024 Fluoroscopy was utilized by the requesting physician.  No radiographic interpretation.   CT ABDOMEN PELVIS W CONTRAST Result Date: 06/26/2024 EXAM: CT ABDOMEN AND PELVIS WITH CONTRAST 06/26/2024 07:32:20 PM TECHNIQUE: CT of the abdomen and pelvis was performed with the administration of 125 mL of iohexol  (OMNIPAQUE ) 300 MG/ML solution. Multiplanar reformatted images are provided for review. Automated exposure control, iterative reconstruction, and/or weight-based adjustment of the mA/kV was utilized to reduce the radiation dose to as low as reasonably achievable. COMPARISON: CT abdomen and pelvis 01/02/2024. CLINICAL HISTORY: Sepsis. FINDINGS: LOWER CHEST: No acute abnormality. LIVER: The liver is moderately enlarged, unchanged. GALLBLADDER AND BILE DUCTS: Gallbladder is unremarkable. No biliary ductal dilatation. SPLEEN: The spleen is moderately enlarged, unchanged. PANCREAS: No acute abnormality. ADRENAL GLANDS: No acute abnormality. KIDNEYS, URETERS AND BLADDER: There is a 5 mm calculus in the proximal right ureter. There is mild right-sided hydronephrosis. There are additional punctate nonobstructing right renal calculi. There is a questionable 2 mm calculus  in the proximal left ureter. There is no left-sided hydronephrosis. There is mild nonspecific bilateral perinephric fat stranding. Urinary bladder is unremarkable. GI AND BOWEL: Stomach demonstrates no acute abnormality. The appendix appears normal.  There is no bowel obstruction. PERITONEUM AND RETROPERITONEUM: No ascites. No free air. VASCULATURE: Aorta is normal in caliber. LYMPH NODES: There are enlarged periportal lymph nodes measuring up to 12 mm short axis which have slightly increased in size. REPRODUCTIVE ORGANS: No acute abnormality. BONES AND SOFT TISSUES: Severe degenerative changes of the spine. No acute osseous abnormality. No focal soft tissue abnormality. IMPRESSION: 1. 5 mm calculus in the proximal right ureter with mild right-sided hydronephrosis. 2. Questionable 2 mm calculus in the proximal left ureter without left-sided hydronephrosis. 3. Mild nonspecific bilateral perinephric fat stranding. 4. Moderately enlarged liver and spleen, unchanged. 5. Enlarged periportal lymph nodes measuring up to 12 mm short axis, slightly increased in size. Electronically signed by: Greig Pique MD 06/26/2024 07:48 PM EST RP Workstation: [id number]   DG Chest Port 1 View Result Date: 06/26/2024 CLINICAL DATA:  Questionable sepsis - evaluate for abnormality EXAM: PORTABLE CHEST 1 VIEW COMPARISON:  February 10, 2024 FINDINGS: Evaluation is limited by underpenetration. The cardiomediastinal silhouette is unchanged in contour. No pleural effusion. No pneumothorax. No acute pleuroparenchymal abnormality. IMPRESSION: No acute cardiopulmonary abnormality. Electronically Signed   By: Corean Salter M.D.   On: 06/26/2024 18:16      LOS: 0 days   Ole Bourdon, NP [facility] Pager: [phone]  06/27/2024, 12:13 PM

## 2024-06-27 NOTE — Progress Notes (Signed)
 Patient has been voiding. Post void bladder scan 20 cc.

## 2024-06-27 NOTE — H&P (Signed)
 History and Physical    Patrick Baxter FMW:[id number] DOB: 1970/11/17 DOA: 06/26/2024  PCP: Bulah Alm RAMAN, PA-C  Patient coming from: [facility] ED  Chief Complaint: Fever, chills  HPI: Patrick Baxter is a 53 y.o. male with medical history significant of recurrent E. coli UTIs with incomplete bladder emptying, type 2 diabetes, hypertension, hyperlipidemia, OSA on CPAP, hypothyroidism, gout, class III obesity, anxiety, depression presented to the ED with complaints of fevers and chills.  Vital signs on arrival: Temperature 98.3 F, pulse 108, respiratory rate 17, blood pressure 138/78, and SpO2 99% on room air.  Labs notable for WBC count 13.9, hemoglobin 11.3 (baseline 13-14), MCV 91.5, creatinine 1.2 (baseline 0.7-0.9), COVID/influenza/RSV PCR negative, lactic acid 2.7> 1.9.  UA cloudy in appearance with positive nitrite, large leukocytes, >50 RBCs, >50 WBCs, and many bacteria.  Urine culture in process.  Blood cultures in process.  Chest x-ray showing no acute cardiopulmonary abnormality.    CT abdomen pelvis with contrast showing: IMPRESSION: 1. 5 mm calculus in the proximal right ureter with mild right-sided hydronephrosis. 2. Questionable 2 mm calculus in the proximal left ureter without left-sided hydronephrosis. 3. Mild nonspecific bilateral perinephric fat stranding. 4. Moderately enlarged liver and spleen, unchanged. 5. Enlarged periportal lymph nodes measuring up to 12 mm short axis, slightly increased in size.  Patient was given ceftriaxone  and 1 L LR bolus in the ED.  He was transferred to [facility] and taken directly to the OR by urology for cystoscopy and bilateral ureteral stent placement.  Urology recommended continuing antibiotics and hospitalist admission.  Recommended outpatient follow-up in 2 to 4 weeks after infection is treated for bilateral ureteroscopy, laser lithotripsy, and stent exchange.  Patient reports history of recurrent UTIs and states he  usually knows he has a urinary tract infection when his urine smells bad.  States his PCP recently treated him with a course of Macrobid  but he did not improve so he was switched to Keflex  3 days ago.  He started having fevers, chills, and nausea which prompted him to go to the ED to be evaluated.  Reports chronic urinary frequency and urgency and mild dysuria.  Denies abdominal or flank pain.  Denies any episodes of vomiting.  Denies chest pain or shortness of breath.  Review of Systems:  Review of Systems  All other systems reviewed and are negative.   Past Medical History:  Diagnosis Date   Allergy    Anxiety    Back pain    Bilateral chronic knee pain    Chronic headaches    Depression    Dermatographism    Dry skin    Fatigue    Frequent urination    Gout    Hx of echocardiogram 09/07/2019   normal LV function, EF 60-65%, no wall motion abnormality; Dr. Michele Richardson   Hyperlipidemia    Hypertension    Joint pain    Knee pain    Obesity    Pre-diabetes    Sleep apnea    Stress    Wears glasses     Past Surgical History:  Procedure Laterality Date   IR INJECT/THERA/INC NEEDLE/CATH/PLC EPI/LUMB/SAC [facility]  09/14/2020   IR INJECT/THERA/INC NEEDLE/CATH/PLC EPI/LUMB/SAC W/IMG  09/14/2020   KNEE SURGERY  11/2006   right knee arthroscopy and meniscal repair, Dr. Rendell     reports that he has quit smoking. His smoking use included cigarettes. He has never used smokeless tobacco. He reports current alcohol use of about 3.0 standard drinks of  alcohol per week. He reports that he does not use drugs.  Allergies[1]  Family History  Problem Relation Age of Onset   Alzheimer's disease Mother    Heart disease Mother        73, angioplasty, CAD   Cancer Mother        breast   Stroke Mother    Hypertension Mother    Hyperlipidemia Mother    Heart disease Father        angina   Arthritis Father    Cancer Maternal Grandmother        leukemia   Diabetes Paternal Grandmother      Prior to Admission medications  Medication Sig Start Date End Date Taking? Authorizing Provider  acetaminophen  (TYLENOL ) 500 MG tablet Take 1,500 mg by mouth every 6 (six) hours as needed.    [provider]  albuterol  (VENTOLIN  HFA) 108 (90 Base) MCG/ACT inhaler Inhale 2 puffs into the lungs every 6 (six) hours as needed for wheezing or shortness of breath. 12/01/22   Tysinger, Alm RAMAN, PA-C  allopurinol  (ZYLOPRIM ) 300 MG tablet TAKE 1 TABLET(300 MG) BY MOUTH DAILY 03/09/24   Tysinger, Alm RAMAN, PA-C  B Complex-C (PROBEC-T PO) Take 1 tablet by mouth daily.    [provider]  Calcium -Magnesium-Zinc [phone] MG TABS Take 1 tablet by mouth daily.    [provider]  cephALEXin  (KEFLEX ) 500 MG capsule Take 1 capsule (500 mg total) by mouth 3 (three) times daily. 06/24/24   Tysinger, Alm RAMAN, PA-C  cetirizine (ZYRTEC) 10 MG tablet Take 10 mg by mouth daily.    [provider]  cholecalciferol (VITAMIN D3) 25 MCG (1000 UNIT) tablet Take 1 tablet (1,000 Units total) by mouth daily. 12/01/22   Tysinger, Alm RAMAN, PA-C  clotrimazole -betamethasone  (LOTRISONE ) cream Apply 1 Application topically daily. 12/11/23   Tysinger, Alm RAMAN, PA-C  Coenzyme Q10 (CO Q 10 PO) Take 1 tablet by mouth daily at 6 (six) AM.    [provider]  colchicine  0.6 MG tablet Take 1 tablet (0.6 mg total) by mouth daily as needed (for gout). 01/07/24   Cheryle Page, MD  cyclobenzaprine  (FLEXERIL ) 10 MG tablet Take 1 tablet (10 mg total) by mouth 3 (three) times daily as needed for muscle spasms. 01/01/24   Early, Sara E, NP  fenofibrate  (TRICOR ) 145 MG tablet Take 1 tablet (145 mg total) by mouth daily. 05/13/24 05/13/25  Tysinger, Alm RAMAN, PA-C  FLUoxetine  (PROZAC ) 40 MG capsule TAKE 1 CAPSULE(40 MG) BY MOUTH DAILY 03/07/24   Tysinger, Alm RAMAN, PA-C  fluticasone [facility]) 50 MCG/ACT nasal spray Place 2 sprays into both nostrils daily.    [provider]  levothyroxine  (SYNTHROID )  25 MCG tablet TAKE 1 TABLET BY MOUTH EVERY DAY BEFORE BREAKFAST 12/14/23   Tysinger, Alm RAMAN, PA-C  lisinopril  (ZESTRIL ) 20 MG tablet TAKE 1 TABLET(20 MG) BY MOUTH DAILY 03/15/24   Tysinger, Alm RAMAN, PA-C  meclizine  (ANTIVERT ) 25 MG tablet Take 1 tablet (25 mg total) by mouth 2 (two) times daily as needed for dizziness. 01/07/24   Cheryle Page, MD  metoprolol  succinate (TOPROL -XL) 50 MG 24 hr tablet TAKE 1 TABLET BY MOUTH IMMEDIATELY WITH OR IMMEDIATELY FOLLOWING A MEAL 06/24/24   Tysinger, Alm RAMAN, PA-C  montelukast  (SINGULAIR ) 10 MG tablet TAKE 1 TABLET(10 MG) BY MOUTH AT BEDTIME 01/04/24   Tysinger, Alm RAMAN, PA-C  Multiple Vitamins-Minerals (MULTIVITAMIN WITH MINERALS) tablet Take 1 tablet by mouth daily. 12/01/22   Tysinger, Alm RAMAN, PA-C  mupirocin  ointment (BACTROBAN ) 2 % Place 1 Application into the nose 3 (three) times daily. 02/01/24   Tysinger, Alm RAMAN, PA-C  naproxen  (NAPROSYN ) 250 MG tablet Take 250 mg by mouth 3 (three) times daily as needed for mild pain (pain score 1-3).    [provider]  nitrofurantoin , macrocrystal-monohydrate, (MACROBID ) 100 MG capsule Take 1 capsule (100 mg total) by mouth 2 (two) times daily. 06/16/24   Randol Dawes, MD  nitroGLYCERIN  (NITROSTAT ) 0.4 MG SL tablet Place 1 tablet (0.4 mg total) under the tongue every 5 (five) minutes as needed for chest pain. 08/30/19 06/21/24  Tolia, Sunit, DO  nystatin  (MYCOSTATIN /NYSTOP ) powder Apply 1 Application topically 2 (two) times daily. 12/11/23   Tysinger, Alm RAMAN, PA-C  Omega-3 Fatty Acids (SALMON OIL-1000) 200 MG CAPS Take 1 capsule by mouth daily.    [provider]  Semaglutide  (RYBELSUS ) 14 MG TABS Take 1 tablet by mouth daily.    [provider]  sodium chloride  (OCEAN) 0.65 % SOLN nasal spray Place 1 spray into both nostrils as needed for congestion.    [provider]  tamsulosin  (FLOMAX ) 0.4 MG CAPS capsule Take 0.4 mg by mouth daily.    [provider]    Physical  Exam: Vitals:   06/26/24 2330 06/26/24 2340 06/27/24 0001 06/27/24 0012  BP: (!) 91/44 (!) 96/44 (!) 97/43   Pulse: 92 92 88   Resp: 20 16    Temp:   98.9 F (37.2 C)   TempSrc:   Oral   SpO2: 97% 98% 97%   Weight:    (!) 183.3 kg  Height:    5' 8 (1.727 m)    Physical Exam Vitals reviewed.  Constitutional:      General: He is not in acute distress. HENT:     Head: Normocephalic and atraumatic.  Eyes:     Extraocular Movements: Extraocular movements intact.  Cardiovascular:     Rate and Rhythm: Normal rate and regular rhythm.     Heart sounds: Normal heart sounds.  Pulmonary:     Effort: Pulmonary effort is normal. No respiratory distress.     Breath sounds: Normal breath sounds.  Abdominal:     General: Bowel sounds are normal.     Palpations: Abdomen is soft.     Tenderness: There is no abdominal tenderness. There is no guarding.  Musculoskeletal:     Cervical back: Normal range of motion.     Right lower leg: No edema.     Left lower leg: No edema.  Skin:    General: Skin is warm and dry.  Neurological:     General: No focal deficit present.     Mental Status: He is alert and oriented to person, place, and time.     Labs on Admission: I have personally reviewed following labs and imaging studies  CBC: Recent Labs  Lab 06/21/24 1259 06/26/24 1804  WBC 6.5 13.9*  NEUTROABS  --  11.1*  HGB 12.1* 11.3*  HCT 37.3* 34.6*  MCV 91 91.5  PLT 183 202   Basic Metabolic Panel: Recent Labs  Lab 06/21/24 1258 06/26/24 1804  NA 136 136  K 4.6 4.5  CL 100 99  CO2 24 25  GLUCOSE 120* 127*  BUN 12 21*  CREATININE 0.92 1.22  CALCIUM  9.1 9.5   GFR: Estimated Creatinine Clearance: 110.7 mL/min (by C-G formula based on SCr of 1.22 mg/dL). Liver Function Tests: Recent Labs  Lab 06/26/24 1804  AST 32  ALT 22  ALKPHOS 153*  BILITOT 0.6  PROT 7.5  ALBUMIN 3.0*   No results for input(s): LIPASE, AMYLASE in the last 168 hours. No results for input(s):  AMMONIA in the last 168 hours. Coagulation Profile: No results for input(s): INR, PROTIME in the last 168 hours. Cardiac Enzymes: No results for input(s): CKTOTAL, CKMB, CKMBINDEX, TROPONINI in the last 168 hours. BNP (last 3 results) No results for input(s): PROBNP in the last 8760 hours. HbA1C: No results for input(s): HGBA1C in the last 72 hours. CBG: No results for input(s): GLUCAP in the last 168 hours. Lipid Profile: No results for input(s): CHOL, HDL, LDLCALC, TRIG, CHOLHDL, LDLDIRECT in the last 72 hours. Thyroid  Function Tests: No results for input(s): TSH, T4TOTAL, FREET4, T3FREE, THYROIDAB in the last 72 hours. Anemia Panel: No results for input(s): VITAMINB12, FOLATE, FERRITIN, TIBC, IRON, RETICCTPCT in the last 72 hours. Urine analysis:    Component Value Date/Time   COLORURINE YELLOW 06/26/2024 1804   APPEARANCEUR CLOUDY (A) 06/26/2024 1804   APPEARANCEUR Cloudy (A) 05/13/2024 1047   LABSPEC 1.024 06/26/2024 1804   LABSPEC 1.010 06/16/2024 1119   PHURINE 6.0 06/26/2024 1804   GLUCOSEU NEGATIVE 06/26/2024 1804   HGBUR LARGE (A) 06/26/2024 1804   BILIRUBINUR NEGATIVE 06/26/2024 1804   BILIRUBINUR negative 06/16/2024 1119   BILIRUBINUR Negative 05/13/2024 1047   KETONESUR TRACE (A) 06/26/2024 1804   PROTEINUR 100 (A) 06/26/2024 1804   UROBILINOGEN negative (A) 10/28/2016 1044   NITRITE POSITIVE (A) 06/26/2024 1804   LEUKOCYTESUR LARGE (A) 06/26/2024 1804    Radiological Exams on Admission: DG C-Arm 1-60 Min-No Report Result Date: 06/26/2024 Fluoroscopy was utilized by the requesting physician.  No radiographic interpretation.   CT ABDOMEN PELVIS W CONTRAST Result Date: 06/26/2024 EXAM: CT ABDOMEN AND PELVIS WITH CONTRAST 06/26/2024 07:32:20 PM TECHNIQUE: CT of the abdomen and pelvis was performed with the administration of 125 mL of iohexol  (OMNIPAQUE ) 300 MG/ML solution. Multiplanar reformatted images are  provided for review. Automated exposure control, iterative reconstruction, and/or weight-based adjustment of the mA/kV was utilized to reduce the radiation dose to as low as reasonably achievable. COMPARISON: CT abdomen and pelvis 01/02/2024. CLINICAL HISTORY: Sepsis. FINDINGS: LOWER CHEST: No acute abnormality. LIVER: The liver is moderately enlarged, unchanged. GALLBLADDER AND BILE DUCTS: Gallbladder is unremarkable. No biliary ductal dilatation. SPLEEN: The spleen is moderately enlarged, unchanged. PANCREAS: No acute abnormality. ADRENAL GLANDS: No acute abnormality. KIDNEYS, URETERS AND BLADDER: There is a 5 mm calculus in the proximal right ureter. There is mild right-sided hydronephrosis. There are additional punctate nonobstructing right renal calculi. There is a questionable 2 mm calculus in the proximal left ureter. There is no left-sided hydronephrosis. There is mild nonspecific bilateral perinephric fat stranding. Urinary bladder is unremarkable. GI AND BOWEL: Stomach demonstrates no acute abnormality. The appendix appears normal. There is no bowel obstruction. PERITONEUM AND RETROPERITONEUM: No ascites. No free air. VASCULATURE: Aorta is normal in caliber. LYMPH NODES: There are enlarged periportal lymph nodes measuring up to 12 mm short axis which have slightly increased in size. REPRODUCTIVE ORGANS: No acute abnormality. BONES AND SOFT TISSUES: Severe degenerative changes of the spine. No acute osseous abnormality. No focal soft tissue abnormality. IMPRESSION: 1. 5 mm calculus in the proximal right ureter with mild right-sided hydronephrosis. 2. Questionable 2 mm calculus in the proximal left ureter without left-sided hydronephrosis. 3. Mild nonspecific bilateral perinephric fat stranding. 4. Moderately enlarged liver and spleen, unchanged. 5. Enlarged periportal lymph nodes measuring up to 12 mm short axis, slightly  increased in size. Electronically signed by: Greig Pique MD 06/26/2024 07:48 PM EST  RP Workstation: [id number]   DG Chest Port 1 View Result Date: 06/26/2024 CLINICAL DATA:  Questionable sepsis - evaluate for abnormality EXAM: PORTABLE CHEST 1 VIEW COMPARISON:  February 10, 2024 FINDINGS: Evaluation is limited by underpenetration. The cardiomediastinal silhouette is unchanged in contour. No pleural effusion. No pneumothorax. No acute pleuroparenchymal abnormality. IMPRESSION: No acute cardiopulmonary abnormality. Electronically Signed   By: Corean Salter M.D.   On: 06/26/2024 18:16    Assessment and Plan  Sepsis secondary to complicated UTI/obstructing ureteral stone History of recurrent E. coli UTIs with incomplete bladder emptying Met sepsis criteria with tachycardia, leukocytosis, and lactic acidosis.  UA cloudy in appearance with positive nitrite, large leukocytes, >50 RBCs, >50 WBCs, and many bacteria.  Failed outpatient antibiotic therapy.  CT showing a 5 mm calculus in the proximal right ureter with mild right-sided hydronephrosis, questionable 2 mm calculus in the proximal left ureter without left-sided hydronephrosis, and mild nonspecific bilateral perinephric fat stranding.  Patient was taken to the OR tonight by urology for cystoscopy and bilateral ureteral stent placement.  Lactic acidosis resolved after 1 L fluid bolus in the ED but blood pressure remains soft with systolic in the 90s.  Continue IV fluid hydration, ceftriaxone , and Flomax .  Follow-up urine and blood cultures.  Trend WBC count.  Urology recommending outpatient follow-up in 2 to 4 weeks after infection is treated for bilateral ureteroscopy, laser lithotripsy, and stent exchange.  Mild AKI Creatinine 1.2, baseline 0.7-0.9.  Continue IV fluid hydration and monitor renal function.  Avoid nephrotoxic agents/hold lisinopril  at this time.  Normocytic anemia Hemoglobin 11.3 (baseline 13-14).  Patient is not endorsing any symptoms of GI bleed.  Monitor H&H.  Type 2 diabetes Hemoglobin A1c 6.3 in June 2025.   Placed on sensitive sliding scale insulin  ACHS.  Hypertension Holding antihypertensives at this time.  Hyperlipidemia Continue fenofibrate .  OSA Continue nightly CPAP.  Hypothyroidism Continue Synthroid .  Gout Continue allopurinol .  Anxiety and depression Continue Prozac .  DVT prophylaxis: SCDs Code Status: DNR pre-arrest interventions desired (discussed with the patient) Level of care: Telemetry bed Admission status: It is my clinical opinion that referral for OBSERVATION is reasonable and necessary in this patient based on the above information provided. The aforementioned taken together are felt to place the patient at high risk for further clinical deterioration. However, it is anticipated that the patient may be medically stable for discharge from the hospital within 24 to 48 hours.  Editha Ram MD [facility]  If 7PM-7AM, please contact night-coverage [email or web address]  06/27/2024, 12:34 AM       [1]  Allergies Allergen Reactions   Atorvastatin  Other (See Comments)    myalgia   Pravastatin  Sodium Other (See Comments)    myalgia

## 2024-06-27 NOTE — Anesthesia Postprocedure Evaluation (Signed)
 Anesthesia Post Note  Patient: Patrick Baxter  Procedure(s) Performed: ERNESTA, WITH RETROGRADE PYELOGRAM AND STENT INSERTION (Bilateral)     Patient location during evaluation: PACU Anesthesia Type: General Level of consciousness: awake and alert Pain management: pain level controlled Vital Signs Assessment: post-procedure vital signs reviewed and stable Respiratory status: spontaneous breathing, nonlabored ventilation, respiratory function stable and patient connected to nasal cannula oxygen Cardiovascular status: blood pressure returned to baseline and stable Postop Assessment: no apparent nausea or vomiting Anesthetic complications: no   No notable events documented.  Last Vitals:  Vitals:   06/27/24 1003 06/27/24 1451  BP: (!) 112/56 114/65  Pulse: 89 86  Resp: 18 16  Temp: 36.5 C 36.8 C  SpO2: 94% 95%    Last Pain:  Vitals:   06/27/24 2016  TempSrc:   PainSc: 0-No pain                 Shetara Launer

## 2024-06-27 NOTE — Progress Notes (Signed)
°   06/27/24 0242  BiPAP/CPAP/SIPAP  $ Non-Invasive Home Ventilator  Initial  $ Face Mask XL Yes  $ Face Mask Large  Yes  BiPAP/CPAP/SIPAP Pt Type Adult  Mask Type Full face mask  Dentures removed? Not applicable  Flow Rate 3 lpm  Patient Home Mask No  Patient Home Tubing No  Auto Titrate Yes  Minimum cmH2O 7 cmH2O  Maximum cmH2O 20 cmH2O  Device Plugged into RED Power Outlet Yes  BiPAP/CPAP /SiPAP Vitals  Resp 12  MEWS Score/Color  MEWS Score 2  MEWS Score Color Yellow

## 2024-06-27 NOTE — Plan of Care (Signed)

## 2024-06-27 NOTE — Plan of Care (Signed)
  Problem: Education: Goal: Knowledge of General Education information will improve Description: Including pain rating scale, medication(s)/side effects and non-pharmacologic comfort measures Outcome: Progressing   Problem: Health Behavior/Discharge Planning: Goal: Ability to manage health-related needs will improve Outcome: Progressing   Problem: Clinical Measurements: Goal: Ability to maintain clinical measurements within normal limits will improve Outcome: Progressing Goal: Will remain free from infection Outcome: Progressing Goal: Diagnostic test results will improve Outcome: Progressing Goal: Respiratory complications will improve Outcome: Progressing Goal: Cardiovascular complication will be avoided Outcome: Progressing   Problem: Activity: Goal: Risk for activity intolerance will decrease Outcome: Progressing   Problem: Nutrition: Goal: Adequate nutrition will be maintained Outcome: Progressing   Problem: Coping: Goal: Level of anxiety will decrease Outcome: Progressing   Problem: Elimination: Goal: Will not experience complications related to urinary retention Outcome: Progressing   Problem: Pain Managment: Goal: General experience of comfort will improve and/or be controlled Outcome: Progressing   Problem: Safety: Goal: Ability to remain free from injury will improve Outcome: Progressing   Problem: Skin Integrity: Goal: Risk for impaired skin integrity will decrease Outcome: Progressing   Problem: Education: Goal: Ability to describe self-care measures that may prevent or decrease complications (Diabetes Survival Skills Education) will improve Outcome: Progressing Goal: Individualized Educational Video(s) Outcome: Progressing   Problem: Coping: Goal: Ability to adjust to condition or change in health will improve Outcome: Progressing   Problem: Fluid Volume: Goal: Ability to maintain a balanced intake and output will improve Outcome: Progressing    Problem: Health Behavior/Discharge Planning: Goal: Ability to identify and utilize available resources and services will improve Outcome: Progressing Goal: Ability to manage health-related needs will improve Outcome: Progressing   Problem: Metabolic: Goal: Ability to maintain appropriate glucose levels will improve Outcome: Progressing   Problem: Nutritional: Goal: Maintenance of adequate nutrition will improve Outcome: Progressing Goal: Progress toward achieving an optimal weight will improve Outcome: Progressing   Problem: Skin Integrity: Goal: Risk for impaired skin integrity will decrease Outcome: Progressing   Problem: Tissue Perfusion: Goal: Adequacy of tissue perfusion will improve Outcome: Progressing

## 2024-06-28 ENCOUNTER — Telehealth: Payer: Self-pay | Admitting: Internal Medicine

## 2024-06-28 LAB — GLUCOSE, CAPILLARY: Glucose-Capillary: 159 mg/dL — ABNORMAL HIGH (ref 70–99)

## 2024-06-28 LAB — URINE CULTURE: Culture: >=100000 — AB

## 2024-06-28 NOTE — Telephone Encounter (Signed)
 Send reply through mychart for patient  Copied from CRM [id number]. Topic: Clinical - Medical Advice >> Jun 28, 2024  4:14 PM Patrick Baxter wrote: Reason for CRM: Patient was discharged today from [facility] due to uti. The er summary did not specify when the patient should return to work. The pt has a follow visit with Dr. Vita on 12/26. The patient is requesting guidance on when he can return to work. Please follow up with the  patient via mychart

## 2024-06-28 NOTE — Plan of Care (Signed)

## 2024-06-28 NOTE — Discharge Summary (Signed)
 Physician Discharge Summary  Patrick Baxter FMW:[id number] DOB: Jun 15, 1971 DOA: 06/26/2024  PCP: Bulah Alm RAMAN, PA-C  Admit date: 06/26/2024 Discharge date: 06/28/2024  Admitted From: Home Disposition: Home  Recommendations for Outpatient Follow-up:  Follow up with PCP in 1-2 weeks Please obtain BMP/CBC in one week Urology to schedule follow-up  Home Health: N/A Equipment/Devices: N/A  Discharge Condition: Stable CODE STATUS: DNR/DNI Diet recommendation: Regular diet  Discharge summary:  53 year old gentleman with history of recurrent UTIs, incomplete bladder emptying, type 2 diabetes, hypertension, sleep apnea on CPAP, hypothyroidism, obesity, anxiety and depression presented from home with fever and chills.  At the emergency room temperature 98.3, heart rate 108, blood pressure is stable.  On room air.  WBC count 13.9.  Creatinine 1.2.  COVID influenza and RSV negative.  Lactic acid 2.7-1.9.  UA was abnormal.  CT scan showed 5 mm calculus proximal right ureter with mild right-sided hydronephrosis.  Patient was resuscitated, given IV antibiotics and IV fluids and admitted to the hospital after undergoing cystoscopy and bilateral ureteric stent placement.  Infected right ureteric stone: Blood cultures negative.  Urine culture with E. coli.  Rocephin  x 2 days in the hospital. Status post bilateral ureteric stent placement.  Foley catheter removed. Urology to schedule outpatient follow-up.  Patient will complete 5 days of Keflex  that he already has at home.  Patient can go back on all his long-term medications, resume home medications on discharge.  Stable for discharge with outpatient follow-up.    Discharge Diagnoses:  Principal Problem:   Complicated UTI (urinary tract infection) Active Problems:   Hypothyroidism   Kidney stone   Sepsis (HCC)   AKI (acute kidney injury)    Discharge Instructions  Discharge Instructions     Diet Carb Modified   Complete by: As  directed    Increase activity slowly   Complete by: As directed       Allergies as of 06/28/2024       Reactions   Atorvastatin  Other (See Comments)   myalgia   Pravastatin  Sodium Other (See Comments)   myalgia        Medication List     TAKE these medications    acetaminophen  500 MG tablet Commonly known as: TYLENOL  Take 1,500 mg by mouth every 6 (six) hours as needed.   albuterol  108 (90 Base) MCG/ACT inhaler Commonly known as: VENTOLIN  HFA Inhale 2 puffs into the lungs every 6 (six) hours as needed for wheezing or shortness of breath.   allopurinol  300 MG tablet Commonly known as: ZYLOPRIM  TAKE 1 TABLET(300 MG) BY MOUTH DAILY   Calcium -Magnesium-Zinc 167-83-8 MG Tabs Take 1 tablet by mouth daily.   cephALEXin  500 MG capsule Commonly known as: KEFLEX  Take 1 capsule (500 mg total) by mouth 3 (three) times daily.   cetirizine 10 MG tablet Commonly known as: ZYRTEC Take 10 mg by mouth daily.   cholecalciferol 25 MCG (1000 UNIT) tablet Commonly known as: VITAMIN D3 Take 1 tablet (1,000 Units total) by mouth daily.   clotrimazole -betamethasone  cream Commonly known as: LOTRISONE  Apply 1 Application topically daily.   CO Q 10 PO Take 1 tablet by mouth daily at 6 (six) AM.   colchicine  0.6 MG tablet Take 1 tablet (0.6 mg total) by mouth daily as needed (for gout).   cyclobenzaprine  10 MG tablet Commonly known as: FLEXERIL  Take 1 tablet (10 mg total) by mouth 3 (three) times daily as needed for muscle spasms.   fenofibrate  145 MG tablet Commonly known as: Tricor   Take 1 tablet (145 mg total) by mouth daily.   FLUoxetine  40 MG capsule Commonly known as: PROZAC  TAKE 1 CAPSULE(40 MG) BY MOUTH DAILY   fluticasone 50 MCG/ACT nasal spray Commonly known as: FLONASE Place 2 sprays into both nostrils daily.   levothyroxine  25 MCG tablet Commonly known as: SYNTHROID  TAKE 1 TABLET BY MOUTH EVERY DAY BEFORE BREAKFAST   lisinopril  20 MG tablet Commonly  known as: ZESTRIL  TAKE 1 TABLET(20 MG) BY MOUTH DAILY   meclizine  25 MG tablet Commonly known as: ANTIVERT  Take 1 tablet (25 mg total) by mouth 2 (two) times daily as needed for dizziness.   metoprolol  succinate 50 MG 24 hr tablet Commonly known as: TOPROL -XL TAKE 1 TABLET BY MOUTH IMMEDIATELY WITH OR IMMEDIATELY FOLLOWING A MEAL   montelukast  10 MG tablet Commonly known as: SINGULAIR  TAKE 1 TABLET(10 MG) BY MOUTH AT BEDTIME   multivitamin with minerals tablet Take 1 tablet by mouth daily.   mupirocin  ointment 2 % Commonly known as: BACTROBAN  Place 1 Application into the nose 3 (three) times daily.   naproxen  250 MG tablet Commonly known as: NAPROSYN  Take 250 mg by mouth 3 (three) times daily as needed for mild pain (pain score 1-3).   nitrofurantoin  (macrocrystal-monohydrate) 100 MG capsule Commonly known as: Macrobid  Take 1 capsule (100 mg total) by mouth 2 (two) times daily.   nitroGLYCERIN  0.4 MG SL tablet Commonly known as: NITROSTAT  Place 1 tablet (0.4 mg total) under the tongue every 5 (five) minutes as needed for chest pain.   nystatin  powder Commonly known as: MYCOSTATIN /NYSTOP  Apply 1 Application topically 2 (two) times daily.   PROBEC-T PO Take 1 tablet by mouth daily.   Rybelsus  14 MG Tabs Generic drug: Semaglutide  Take 1 tablet by mouth daily.   Salmon Oil-1000 200 MG Caps Take 1 capsule by mouth daily.   sodium chloride  0.65 % Soln nasal spray Commonly known as: OCEAN Place 1 spray into both nostrils as needed for congestion.   tamsulosin  0.4 MG Caps capsule Commonly known as: FLOMAX  Take 0.4 mg by mouth daily.        Allergies[1]  Consultations: Urology   Procedures/Studies: DG C-Arm 1-60 Min-No Report Result Date: 06/26/2024 Fluoroscopy was utilized by the requesting physician.  No radiographic interpretation.   CT ABDOMEN PELVIS W CONTRAST Result Date: 06/26/2024 EXAM: CT ABDOMEN AND PELVIS WITH CONTRAST 06/26/2024 07:32:20 PM  TECHNIQUE: CT of the abdomen and pelvis was performed with the administration of 125 mL of iohexol  (OMNIPAQUE ) 300 MG/ML solution. Multiplanar reformatted images are provided for review. Automated exposure control, iterative reconstruction, and/or weight-based adjustment of the mA/kV was utilized to reduce the radiation dose to as low as reasonably achievable. COMPARISON: CT abdomen and pelvis 01/02/2024. CLINICAL HISTORY: Sepsis. FINDINGS: LOWER CHEST: No acute abnormality. LIVER: The liver is moderately enlarged, unchanged. GALLBLADDER AND BILE DUCTS: Gallbladder is unremarkable. No biliary ductal dilatation. SPLEEN: The spleen is moderately enlarged, unchanged. PANCREAS: No acute abnormality. ADRENAL GLANDS: No acute abnormality. KIDNEYS, URETERS AND BLADDER: There is a 5 mm calculus in the proximal right ureter. There is mild right-sided hydronephrosis. There are additional punctate nonobstructing right renal calculi. There is a questionable 2 mm calculus in the proximal left ureter. There is no left-sided hydronephrosis. There is mild nonspecific bilateral perinephric fat stranding. Urinary bladder is unremarkable. GI AND BOWEL: Stomach demonstrates no acute abnormality. The appendix appears normal. There is no bowel obstruction. PERITONEUM AND RETROPERITONEUM: No ascites. No free air. VASCULATURE: Aorta is normal in caliber. LYMPH NODES:  There are enlarged periportal lymph nodes measuring up to 12 mm short axis which have slightly increased in size. REPRODUCTIVE ORGANS: No acute abnormality. BONES AND SOFT TISSUES: Severe degenerative changes of the spine. No acute osseous abnormality. No focal soft tissue abnormality. IMPRESSION: 1. 5 mm calculus in the proximal right ureter with mild right-sided hydronephrosis. 2. Questionable 2 mm calculus in the proximal left ureter without left-sided hydronephrosis. 3. Mild nonspecific bilateral perinephric fat stranding. 4. Moderately enlarged liver and spleen,  unchanged. 5. Enlarged periportal lymph nodes measuring up to 12 mm short axis, slightly increased in size. Electronically signed by: Greig Pique MD 06/26/2024 07:48 PM EST RP Workstation: [id number]   DG Chest Port 1 View Result Date: 06/26/2024 CLINICAL DATA:  Questionable sepsis - evaluate for abnormality EXAM: PORTABLE CHEST 1 VIEW COMPARISON:  February 10, 2024 FINDINGS: Evaluation is limited by underpenetration. The cardiomediastinal silhouette is unchanged in contour. No pleural effusion. No pneumothorax. No acute pleuroparenchymal abnormality. IMPRESSION: No acute cardiopulmonary abnormality. Electronically Signed   By: Corean Salter M.D.   On: 06/26/2024 18:16   (Echo, Carotid, EGD, Colonoscopy, ERCP)    Subjective: Patient seen in the morning rounds.  No overnight events.  Comfortable with plan to go home.   Discharge Exam: Vitals:   06/27/24 2107 06/28/24 0504  BP: (!) 144/69 133/78  Pulse: 89 75  Resp:  18  Temp: 98.7 F (37.1 C) (!) 97.5 F (36.4 C)  SpO2: 97% 96%   Vitals:   06/27/24 1003 06/27/24 1451 06/27/24 2107 06/28/24 0504  BP: (!) 112/56 114/65 (!) 144/69 133/78  Pulse: 89 86 89 75  Resp: 18 16  18   Temp: 97.7 F (36.5 C) 98.2 F (36.8 C) 98.7 F (37.1 C) (!) 97.5 F (36.4 C)  TempSrc: Oral  Oral Oral  SpO2: 94% 95% 97% 96%  Weight:      Height:        General: Pt is alert, awake, not in acute distress Cardiovascular: RRR, S1/S2 +, no rubs, no gallops Respiratory: CTA bilaterally, no wheezing, no rhonchi Abdominal: Soft, NT, ND, bowel sounds + Extremities: no edema, no cyanosis    The results of significant diagnostics from this hospitalization (including imaging, microbiology, ancillary and laboratory) are listed below for reference.     Microbiology: Recent Results (from the past 240 hours)  Resp panel by RT-PCR (RSV, Flu A&B, Covid) Anterior Nasal Swab     Status: None   Collection Time: 06/26/24  4:41 PM   Specimen: Anterior Nasal Swab   Result Value Ref Range Status   SARS Coronavirus 2 by RT PCR NEGATIVE NEGATIVE Final    Comment: (NOTE) SARS-CoV-2 target nucleic acids are NOT DETECTED.  The SARS-CoV-2 RNA is generally detectable in upper respiratory specimens during the acute phase of infection. The lowest concentration of SARS-CoV-2 viral copies this assay can detect is 138 copies/mL. A negative result does not preclude SARS-Cov-2 infection and should not be used as the sole basis for treatment or other patient management decisions. A negative result may occur with  improper specimen collection/handling, submission of specimen other than nasopharyngeal swab, presence of viral mutation(s) within the areas targeted by this assay, and inadequate number of viral copies(<138 copies/mL). A negative result must be combined with clinical observations, patient history, and epidemiological information. The expected result is Negative.  Fact Sheet for Patients:  bloggercourse.com  Fact Sheet for Healthcare Providers:  seriousbroker.it  This test is no t yet approved or cleared by the [place]   FDA and  has been authorized for detection and/or diagnosis of SARS-CoV-2 by FDA under an Emergency Use Authorization (EUA). This EUA will remain  in effect (meaning this test can be used) for the duration of the COVID-19 declaration under Section 564(b)(1) of the Act, 21 U.S.C.section 360bbb-3(b)(1), unless the authorization is terminated  or revoked sooner.       Influenza A by PCR NEGATIVE NEGATIVE Final   Influenza B by PCR NEGATIVE NEGATIVE Final    Comment: (NOTE) The Xpert Xpress SARS-CoV-2/FLU/RSV plus assay is intended as an aid in the diagnosis of influenza from Nasopharyngeal swab specimens and should not be used as a sole basis for treatment. Nasal washings and aspirates are unacceptable for Xpert Xpress SARS-CoV-2/FLU/RSV testing.  Fact Sheet for  Patients: bloggercourse.com  Fact Sheet for Healthcare Providers: seriousbroker.it  This test is not yet approved or cleared by the [place]  FDA and has been authorized for detection and/or diagnosis of SARS-CoV-2 by FDA under an Emergency Use Authorization (EUA). This EUA will remain in effect (meaning this test can be used) for the duration of the COVID-19 declaration under Section 564(b)(1) of the Act, 21 U.S.C. section 360bbb-3(b)(1), unless the authorization is terminated or revoked.     Resp Syncytial Virus by PCR NEGATIVE NEGATIVE Final    Comment: (NOTE) Fact Sheet for Patients: bloggercourse.com  Fact Sheet for Healthcare Providers: seriousbroker.it  This test is not yet approved or cleared by the [place]  FDA and has been authorized for detection and/or diagnosis of SARS-CoV-2 by FDA under an Emergency Use Authorization (EUA). This EUA will remain in effect (meaning this test can be used) for the duration of the COVID-19 declaration under Section 564(b)(1) of the Act, 21 U.S.C. section 360bbb-3(b)(1), unless the authorization is terminated or revoked.  Performed at [facility], [address], [place], [place] [address]   Urine Culture     Status: Abnormal   Collection Time: 06/26/24  6:04 PM   Specimen: Urine, Random  Result Value Ref Range Status   Specimen Description   Final    URINE, RANDOM Performed at [facility], [address] [place], [place] [address]    Special Requests   Final    NONE Reflexed from [id number] Performed at [facility], [address], [place], [place] [address]    Culture >=100,000 COLONIES/mL ESCHERICHIA COLI (A)  Final   Report Status 06/28/2024 FINAL  Final   Organism ID, Bacteria ESCHERICHIA COLI (A)  Final      Susceptibility   Escherichia coli - MIC*     AMPICILLIN >=32 RESISTANT Resistant     CEFAZOLIN (URINE) Value in next row Sensitive      4 SENSITIVEThis is a modified FDA-approved test that has been validated and its performance characteristics determined by the reporting laboratory.  This laboratory is certified under the Clinical Laboratory Improvement Amendments CLIA as qualified to perform high complexity clinical laboratory testing.    CEFEPIME Value in next row Sensitive      4 SENSITIVEThis is a modified FDA-approved test that has been validated and its performance characteristics determined by the reporting laboratory.  This laboratory is certified under the Clinical Laboratory Improvement Amendments CLIA as qualified to perform high complexity clinical laboratory testing.    ERTAPENEM Value in next row Sensitive      4 SENSITIVEThis is a modified FDA-approved test that has been validated and its performance characteristics determined by the reporting laboratory.  This laboratory is certified under  the Clinical Laboratory Improvement Amendments CLIA as qualified to perform high complexity clinical laboratory testing.    CEFTRIAXONE  Value in next row Sensitive      4 SENSITIVEThis is a modified FDA-approved test that has been validated and its performance characteristics determined by the reporting laboratory.  This laboratory is certified under the Clinical Laboratory Improvement Amendments CLIA as qualified to perform high complexity clinical laboratory testing.    CIPROFLOXACIN  Value in next row Resistant      4 SENSITIVEThis is a modified FDA-approved test that has been validated and its performance characteristics determined by the reporting laboratory.  This laboratory is certified under the Clinical Laboratory Improvement Amendments CLIA as qualified to perform high complexity clinical laboratory testing.    GENTAMICIN Value in next row Sensitive      4 SENSITIVEThis is a modified FDA-approved test that has been validated and its  performance characteristics determined by the reporting laboratory.  This laboratory is certified under the Clinical Laboratory Improvement Amendments CLIA as qualified to perform high complexity clinical laboratory testing.    NITROFURANTOIN  Value in next row Sensitive      4 SENSITIVEThis is a modified FDA-approved test that has been validated and its performance characteristics determined by the reporting laboratory.  This laboratory is certified under the Clinical Laboratory Improvement Amendments CLIA as qualified to perform high complexity clinical laboratory testing.    TRIMETH/SULFA Value in next row Resistant      4 SENSITIVEThis is a modified FDA-approved test that has been validated and its performance characteristics determined by the reporting laboratory.  This laboratory is certified under the Clinical Laboratory Improvement Amendments CLIA as qualified to perform high complexity clinical laboratory testing.    AMPICILLIN/SULBACTAM Value in next row Sensitive      4 SENSITIVEThis is a modified FDA-approved test that has been validated and its performance characteristics determined by the reporting laboratory.  This laboratory is certified under the Clinical Laboratory Improvement Amendments CLIA as qualified to perform high complexity clinical laboratory testing.    PIP/TAZO Value in next row Sensitive      <=4 SENSITIVEThis is a modified FDA-approved test that has been validated and its performance characteristics determined by the reporting laboratory.  This laboratory is certified under the Clinical Laboratory Improvement Amendments CLIA as qualified to perform high complexity clinical laboratory testing.    MEROPENEM Value in next row Sensitive      <=4 SENSITIVEThis is a modified FDA-approved test that has been validated and its performance characteristics determined by the reporting laboratory.  This laboratory is certified under the Clinical Laboratory Improvement Amendments CLIA as  qualified to perform high complexity clinical laboratory testing.    * >=100,000 COLONIES/mL ESCHERICHIA COLI  Blood Culture (routine x 2)     Status: None (Preliminary result)   Collection Time: 06/26/24  6:20 PM   Specimen: BLOOD LEFT ARM  Result Value Ref Range Status   Specimen Description   Final    BLOOD LEFT ARM Performed at [facility], [address], [place], [place] [address]    Special Requests   Final    BOTTLES DRAWN AEROBIC AND ANAEROBIC Blood Culture adequate volume Performed at [facility], [address] [place], [place] [address]    Culture   Final    NO GROWTH < 12 HOURS Performed at [facility], [address] [place], [place] [address]    Report Status PENDING  Incomplete  Blood Culture (routine x 2)  Status: None (Preliminary result)   Collection Time: 06/26/24  6:25 PM   Specimen: BLOOD  Result Value Ref Range Status   Specimen Description   Final    BLOOD RIGHT ANTECUBITAL Performed at [facility], [address] [place], [place] [address]    Special Requests   Final    BOTTLES DRAWN AEROBIC AND ANAEROBIC Blood Culture adequate volume Performed at [facility], [address], [place], [place] [address]    Culture   Final    NO GROWTH < 12 HOURS Performed at [facility], [address] [place], [place] [address]    Report Status PENDING  Incomplete  MRSA Next Gen by PCR, Nasal     Status: Abnormal   Collection Time: 06/27/24  1:25 AM   Specimen: Nasal Mucosa; Nasal Swab  Result Value Ref Range Status   MRSA by PCR Next Gen DETECTED (A) NOT DETECTED Final    Comment: CRITICAL RESULT CALLED TO, READ BACK BY AND VERIFIED WITH: LATTA, T. RN AT [phone] 06/27/2024 BY JEREMY C (NOTE) The GeneXpert MRSA Assay (FDA approved for NASAL specimens only), is one component of a comprehensive MRSA colonization surveillance program. It is not intended to diagnose  MRSA infection nor to guide or monitor treatment for MRSA infections. Test performance is not FDA approved in patients less than 26 years old. Performed at [facility], [address] [place], [place] [address]      Labs: BNP (last 3 results) Recent Labs    01/02/24 1959 03/02/24 0906  BNP 347.3* 12.8   Basic Metabolic Panel: Recent Labs  Lab 06/21/24 1258 06/26/24 1804 06/27/24 0505  NA 136 136 134*  K 4.6 4.5 5.1  CL 100 99 101  CO2 24 25 22   GLUCOSE 120* 127* 184*  BUN 12 21* 19  CREATININE 0.92 1.22 1.00  CALCIUM  9.1 9.5 9.2   Liver Function Tests: Recent Labs  Lab 06/26/24 1804  AST 32  ALT 22  ALKPHOS 153*  BILITOT 0.6  PROT 7.5  ALBUMIN 3.0*   No results for input(s): LIPASE, AMYLASE in the last 168 hours. No results for input(s): AMMONIA in the last 168 hours. CBC: Recent Labs  Lab 06/21/24 1259 06/26/24 1804 06/27/24 0505  WBC 6.5 13.9* 11.1*  NEUTROABS  --  11.1*  --   HGB 12.1* 11.3* 11.2*  HCT 37.3* 34.6* 35.5*  MCV 91 91.5 93.4  PLT 183 202 198   Cardiac Enzymes: No results for input(s): CKTOTAL, CKMB, CKMBINDEX, TROPONINI in the last 168 hours. BNP: Invalid input(s): POCBNP CBG: Recent Labs  Lab 06/27/24 0716 06/27/24 1131 06/27/24 1604 06/27/24 2109 06/28/24 0733  GLUCAP 162* 205* 199* 198* 159*   D-Dimer No results for input(s): DDIMER in the last 72 hours. Hgb A1c No results for input(s): HGBA1C in the last 72 hours. Lipid Profile No results for input(s): CHOL, HDL, LDLCALC, TRIG, CHOLHDL, LDLDIRECT in the last 72 hours. Thyroid  function studies No results for input(s): TSH, T4TOTAL, T3FREE, THYROIDAB in the last 72 hours.  Invalid input(s): FREET3 Anemia work up No results for input(s): VITAMINB12, FOLATE, FERRITIN, TIBC, IRON, RETICCTPCT in the last 72 hours. Urinalysis    Component Value Date/Time   COLORURINE YELLOW 06/26/2024 1804    APPEARANCEUR CLOUDY (A) 06/26/2024 1804   APPEARANCEUR Cloudy (A) 05/13/2024 1047   LABSPEC 1.024 06/26/2024 1804   LABSPEC 1.010 06/16/2024 1119   PHURINE 6.0 06/26/2024 1804   GLUCOSEU NEGATIVE 06/26/2024 1804  HGBUR LARGE (A) 06/26/2024 1804   BILIRUBINUR NEGATIVE 06/26/2024 1804   BILIRUBINUR negative 06/16/2024 1119   BILIRUBINUR Negative 05/13/2024 1047   KETONESUR TRACE (A) 06/26/2024 1804   PROTEINUR 100 (A) 06/26/2024 1804   UROBILINOGEN negative (A) 10/28/2016 1044   NITRITE POSITIVE (A) 06/26/2024 1804   LEUKOCYTESUR LARGE (A) 06/26/2024 1804   Sepsis Labs Recent Labs  Lab 06/21/24 1259 06/26/24 1804 06/27/24 0505  WBC 6.5 13.9* 11.1*   Microbiology Recent Results (from the past 240 hours)  Resp panel by RT-PCR (RSV, Flu A&B, Covid) Anterior Nasal Swab     Status: None   Collection Time: 06/26/24  4:41 PM   Specimen: Anterior Nasal Swab  Result Value Ref Range Status   SARS Coronavirus 2 by RT PCR NEGATIVE NEGATIVE Final    Comment: (NOTE) SARS-CoV-2 target nucleic acids are NOT DETECTED.  The SARS-CoV-2 RNA is generally detectable in upper respiratory specimens during the acute phase of infection. The lowest concentration of SARS-CoV-2 viral copies this assay can detect is 138 copies/mL. A negative result does not preclude SARS-Cov-2 infection and should not be used as the sole basis for treatment or other patient management decisions. A negative result may occur with  improper specimen collection/handling, submission of specimen other than nasopharyngeal swab, presence of viral mutation(s) within the areas targeted by this assay, and inadequate number of viral copies(<138 copies/mL). A negative result must be combined with clinical observations, patient history, and epidemiological information. The expected result is Negative.  Fact Sheet for Patients:  bloggercourse.com  Fact Sheet for Healthcare Providers:   seriousbroker.it  This test is no t yet approved or cleared by the [place]  FDA and  has been authorized for detection and/or diagnosis of SARS-CoV-2 by FDA under an Emergency Use Authorization (EUA). This EUA will remain  in effect (meaning this test can be used) for the duration of the COVID-19 declaration under Section 564(b)(1) of the Act, 21 U.S.C.section 360bbb-3(b)(1), unless the authorization is terminated  or revoked sooner.       Influenza A by PCR NEGATIVE NEGATIVE Final   Influenza B by PCR NEGATIVE NEGATIVE Final    Comment: (NOTE) The Xpert Xpress SARS-CoV-2/FLU/RSV plus assay is intended as an aid in the diagnosis of influenza from Nasopharyngeal swab specimens and should not be used as a sole basis for treatment. Nasal washings and aspirates are unacceptable for Xpert Xpress SARS-CoV-2/FLU/RSV testing.  Fact Sheet for Patients: bloggercourse.com  Fact Sheet for Healthcare Providers: seriousbroker.it  This test is not yet approved or cleared by the [place]  FDA and has been authorized for detection and/or diagnosis of SARS-CoV-2 by FDA under an Emergency Use Authorization (EUA). This EUA will remain in effect (meaning this test can be used) for the duration of the COVID-19 declaration under Section 564(b)(1) of the Act, 21 U.S.C. section 360bbb-3(b)(1), unless the authorization is terminated or revoked.     Resp Syncytial Virus by PCR NEGATIVE NEGATIVE Final    Comment: (NOTE) Fact Sheet for Patients: bloggercourse.com  Fact Sheet for Healthcare Providers: seriousbroker.it  This test is not yet approved or cleared by the [place]  FDA and has been authorized for detection and/or diagnosis of SARS-CoV-2 by FDA under an Emergency Use Authorization (EUA). This EUA will remain in effect (meaning this test can be used) for  the duration of the COVID-19 declaration under Section 564(b)(1) of the Act, 21 U.S.C. section 360bbb-3(b)(1), unless the authorization is terminated or revoked.  Performed at [facility],  [address] [place], [place] [address]   Urine Culture     Status: Abnormal   Collection Time: 06/26/24  6:04 PM   Specimen: Urine, Random  Result Value Ref Range Status   Specimen Description   Final    URINE, RANDOM Performed at [facility], [address], [place], [place] [address]    Special Requests   Final    NONE Reflexed from [id number] Performed at [facility], [address], [place], [place] [address]    Culture >=100,000 COLONIES/mL ESCHERICHIA COLI (A)  Final   Report Status 06/28/2024 FINAL  Final   Organism ID, Bacteria ESCHERICHIA COLI (A)  Final      Susceptibility   Escherichia coli - MIC*    AMPICILLIN >=32 RESISTANT Resistant     CEFAZOLIN (URINE) Value in next row Sensitive      4 SENSITIVEThis is a modified FDA-approved test that has been validated and its performance characteristics determined by the reporting laboratory.  This laboratory is certified under the Clinical Laboratory Improvement Amendments CLIA as qualified to perform high complexity clinical laboratory testing.    CEFEPIME Value in next row Sensitive      4 SENSITIVEThis is a modified FDA-approved test that has been validated and its performance characteristics determined by the reporting laboratory.  This laboratory is certified under the Clinical Laboratory Improvement Amendments CLIA as qualified to perform high complexity clinical laboratory testing.    ERTAPENEM Value in next row Sensitive      4 SENSITIVEThis is a modified FDA-approved test that has been validated and its performance characteristics determined by the reporting laboratory.  This laboratory is certified under the Clinical Laboratory Improvement Amendments CLIA as qualified to  perform high complexity clinical laboratory testing.    CEFTRIAXONE  Value in next row Sensitive      4 SENSITIVEThis is a modified FDA-approved test that has been validated and its performance characteristics determined by the reporting laboratory.  This laboratory is certified under the Clinical Laboratory Improvement Amendments CLIA as qualified to perform high complexity clinical laboratory testing.    CIPROFLOXACIN  Value in next row Resistant      4 SENSITIVEThis is a modified FDA-approved test that has been validated and its performance characteristics determined by the reporting laboratory.  This laboratory is certified under the Clinical Laboratory Improvement Amendments CLIA as qualified to perform high complexity clinical laboratory testing.    GENTAMICIN Value in next row Sensitive      4 SENSITIVEThis is a modified FDA-approved test that has been validated and its performance characteristics determined by the reporting laboratory.  This laboratory is certified under the Clinical Laboratory Improvement Amendments CLIA as qualified to perform high complexity clinical laboratory testing.    NITROFURANTOIN  Value in next row Sensitive      4 SENSITIVEThis is a modified FDA-approved test that has been validated and its performance characteristics determined by the reporting laboratory.  This laboratory is certified under the Clinical Laboratory Improvement Amendments CLIA as qualified to perform high complexity clinical laboratory testing.    TRIMETH/SULFA Value in next row Resistant      4 SENSITIVEThis is a modified FDA-approved test that has been validated and its performance characteristics determined by the reporting laboratory.  This laboratory is certified under the Clinical Laboratory Improvement Amendments CLIA as qualified to perform high complexity clinical laboratory testing.    AMPICILLIN/SULBACTAM Value in next row Sensitive      4 SENSITIVEThis is a modified FDA-approved test that has  been validated and its performance characteristics determined by the reporting laboratory.  This laboratory is certified under the Clinical Laboratory Improvement Amendments CLIA as qualified to perform high complexity clinical laboratory testing.    PIP/TAZO Value in next row Sensitive      <=4 SENSITIVEThis is a modified FDA-approved test that has been validated and its performance characteristics determined by the reporting laboratory.  This laboratory is certified under the Clinical Laboratory Improvement Amendments CLIA as qualified to perform high complexity clinical laboratory testing.    MEROPENEM Value in next row Sensitive      <=4 SENSITIVEThis is a modified FDA-approved test that has been validated and its performance characteristics determined by the reporting laboratory.  This laboratory is certified under the Clinical Laboratory Improvement Amendments CLIA as qualified to perform high complexity clinical laboratory testing.    * >=100,000 COLONIES/mL ESCHERICHIA COLI  Blood Culture (routine x 2)     Status: None (Preliminary result)   Collection Time: 06/26/24  6:20 PM   Specimen: BLOOD LEFT ARM  Result Value Ref Range Status   Specimen Description   Final    BLOOD LEFT ARM Performed at [facility], [address], [place], [place] [address]    Special Requests   Final    BOTTLES DRAWN AEROBIC AND ANAEROBIC Blood Culture adequate volume Performed at [facility], [address] [place], [place] [address]    Culture   Final    NO GROWTH < 12 HOURS Performed at [facility], [address] [place], [place] [address]    Report Status PENDING  Incomplete  Blood Culture (routine x 2)     Status: None (Preliminary result)   Collection Time: 06/26/24  6:25 PM   Specimen: BLOOD  Result Value Ref Range Status   Specimen Description   Final    BLOOD RIGHT ANTECUBITAL Performed at [facility], [address],  [place], [place] [address]    Special Requests   Final    BOTTLES DRAWN AEROBIC AND ANAEROBIC Blood Culture adequate volume Performed at [facility], [address], [place], [place] [address]    Culture   Final    NO GROWTH < 12 HOURS Performed at [facility], [address], [place], [place] [address]    Report Status PENDING  Incomplete  MRSA Next Gen by PCR, Nasal     Status: Abnormal   Collection Time: 06/27/24  1:25 AM   Specimen: Nasal Mucosa; Nasal Swab  Result Value Ref Range Status   MRSA by PCR Next Gen DETECTED (A) NOT DETECTED Final    Comment: CRITICAL RESULT CALLED TO, READ BACK BY AND VERIFIED WITH: LATTA, T. RN AT [phone] 06/27/2024 BY JEREMY C (NOTE) The GeneXpert MRSA Assay (FDA approved for NASAL specimens only), is one component of a comprehensive MRSA colonization surveillance program. It is not intended to diagnose MRSA infection nor to guide or monitor treatment for MRSA infections. Test performance is not FDA approved in patients less than 38 years old. Performed at [facility], [address] [place], [place] [address]      Time coordinating discharge: 28 minutes  SIGNED:   Renato Applebaum, MD  [facility] 06/28/2024, 9:33 AM     [1]  Allergies Allergen Reactions   Atorvastatin  Other (See Comments)    myalgia   Pravastatin  Sodium Other (See Comments)    myalgia

## 2024-06-29 ENCOUNTER — Telehealth: Payer: Self-pay

## 2024-06-29 ENCOUNTER — Other Ambulatory Visit: Payer: Self-pay

## 2024-06-29 ENCOUNTER — Other Ambulatory Visit: Payer: Self-pay | Admitting: Medical

## 2024-06-29 DIAGNOSIS — N201 Calculus of ureter: Secondary | ICD-10-CM

## 2024-06-29 MED ORDER — TAMSULOSIN HCL 0.4 MG PO CAPS: 0.4000 mg | ORAL_CAPSULE | Freq: Every day | ORAL | 1 refills | Status: DC

## 2024-06-29 MED ORDER — FLUOXETINE HCL 40 MG PO CAPS: ORAL_CAPSULE | ORAL | 1 refills | Status: AC

## 2024-06-29 MED ORDER — OXYBUTYNIN CHLORIDE ER 10 MG PO TB24: 10.0000 mg | ORAL_TABLET | Freq: Every day | ORAL | 0 refills | Status: DC

## 2024-06-29 NOTE — Progress Notes (Signed)
 Surgical Physician Order Form [facility] [place]  Dr. Redell Burnet, MD  * Scheduling expectation : 2-4 weeks  *Length of Case: 1 hour  *Clearance needed: no  *Anticoagulation Instructions: May continue all anticoagulants  *Aspirin Instructions: Ok to continue Aspirin  *Post-op visit Date/Instructions:  tbd  *Diagnosis: Right Ureteral Stone, left ureteral stone  *Procedure: bilateral  Ureteroscopy w/laser lithotripsy & stent exchange (47643)   Additional orders: N/A  -Admit type: OUTpatient  -Anesthesia: General  -VTE Prophylaxis Standing Order SCD's       Other:   -Standing Lab Orders Per Anesthesia    Lab other: UA&Urine Culture  -Standing Test orders EKG/Chest x-ray per Anesthesia       Test other:   - Medications:  Ancef 2gm IV  -Other orders:  N/A

## 2024-06-29 NOTE — Telephone Encounter (Signed)
 See my chart message

## 2024-06-29 NOTE — Addendum Note (Signed)
 Addended by: RUTHER SETTER A on: 06/29/2024 02:50 PM   Modules accepted: Orders

## 2024-06-29 NOTE — Progress Notes (Signed)
 Spoke with pt. Pt is having pain consistent with ureteral spasms from the stent. Spoke with Dr. Francisca and I have sent over Oxybutynin  and Flomax  to the pharmacy. Patient is aware and verbalized understanding.

## 2024-06-29 NOTE — Telephone Encounter (Signed)
 Per Dr. Francisca, Patient is to be scheduled for Bilateral Ureteroscopy with Laser Lithotripsy and Stent Exchange   Mr. Strick was contacted and possible surgical dates were discussed, Monday January 19th, 2026 was agreed upon for surgery.   Patient was instructed that Dr. Francisca will require them to provide a pre-op UA & CX prior to surgery. This was ordered and scheduled drop off appointment was made for 07/18/2024.    Patient was directed to call [phone] between 1-3pm the day before surgery to find out surgical arrival time.  Instructions were given not to eat or drink from midnight on the night before surgery and have a driver for the day of surgery. On the surgery day patient was instructed to enter through the [facility] entrance of [facility] report the Same Day Surgery desk.   Pre-Admit Testing will be in contact via phone to set up an interview with the anesthesia team to review your history and medications prior to surgery.   Reminder of this information was sent via MyChart to the patient.

## 2024-06-29 NOTE — Transitions of Care (Post Inpatient/ED Visit) (Signed)
 Today's TOC FU Call Status: Today's TOC FU Call Status:: Successful TOC FU Call Completed TOC FU Call Complete Date: 06/29/24  Patient's Name and Date of Birth confirmed. Name, DOB  53 year old gentleman with history of recurrent UTIs, incomplete bladder emptying, type 2 diabetes, hypertension, sleep apnea on CPAP, hypothyroidism, obesity, anxiety and depression presented from home with fever and chills per DC Summary and patient had bilateral ureteric stent placment.   Transition Care Management Follow-up Telephone Call Date of Discharge: 06/28/24 Discharge Facility: [facility]) Type of Discharge: Inpatient Admission Primary Inpatient Discharge Diagnosis:: Bilateral ureteral stent placement How have you been since you were released from the hospital?: Better Any questions or concerns?: No  Items Reviewed: Did you receive and understand the discharge instructions provided?: Yes Medications obtained,verified, and reconciled?: Yes (Medications Reviewed) Any new allergies since your discharge?: No Dietary orders reviewed?: Yes Type of Diet Ordered:: Carb Modified Do you have support at home?: Yes People in Home [RPT]: parent(s) Name of Support/Comfort Primary Source: Herendeen,Joe  Father, Emergency Contact  [phone] (Home Phone)  Medications Reviewed Today: Medications Reviewed Today     Reviewed by Carolee Heron NOVAK, RN (Case Manager) on 06/29/24 at 1055  Med List Status: <None>   Medication Order Taking? Sig Documenting Provider Last Dose Status Informant  acetaminophen  (TYLENOL ) 500 MG tablet [id number] Yes Take 1,500 mg by mouth every 6 (six) hours as needed. [provider]  Active   albuterol  (VENTOLIN  HFA) 108 (90 Base) MCG/ACT inhaler [id number] Yes Inhale 2 puffs into the lungs every 6 (six) hours as needed for wheezing or shortness of breath. Tysinger, Alm RAMAN, PA-C  Active Self           Med Note (CRUTHIS, CHLOE C   Tue Jun 28, 2024  7:44 AM)    allopurinol   (ZYLOPRIM ) 300 MG tablet [id number] Yes TAKE 1 TABLET(300 MG) BY MOUTH DAILY Tysinger, Alm RAMAN, PA-C  Active   B Complex-C (PROBEC-T PO) [id number]  Take 1 tablet by mouth daily.  Patient not taking: Reported on 06/29/2024   [provider]  Active Self  Calcium -Magnesium-Zinc [phone] MG TABS [id number]  Take 1 tablet by mouth daily.  Patient not taking: Reported on 06/29/2024   [provider]  Active Self  cephALEXin  (KEFLEX ) 500 MG capsule [id number] Yes Take 1 capsule (500 mg total) by mouth 3 (three) times daily. Tysinger, Alm RAMAN, PA-C  Active   cetirizine (ZYRTEC) 10 MG tablet [id number] Yes Take 10 mg by mouth daily.  Patient taking differently: Take 10 mg by mouth daily. As needed   [provider]  Active Self           Med Note (CRUTHIS, CHLOE C   Tue Jun 28, 2024  7:44 AM)    cholecalciferol (VITAMIN D3) 25 MCG (1000 UNIT) tablet [id number] Yes Take 1 tablet (1,000 Units total) by mouth daily. Tysinger, Alm RAMAN, PA-C  Active Self  clotrimazole -betamethasone  (LOTRISONE ) cream [id number] Yes Apply 1 Application topically daily.  Patient taking differently: Apply 1 Application topically daily as needed.   Tysinger, Alm RAMAN, PA-C  Active Self           Med Note (CRUTHIS, CHLOE C   Tue Jun 28, 2024  7:44 AM)    Coenzyme Q10 (CO Q 10 PO) [id number]  Take 1 tablet by mouth daily at 6 (six) AM.  Patient not taking: Reported on 06/29/2024   [provider]  Active Self  Med Note (CRUTHIS, CHLOE C   Tue Jun 28, 2024  7:44 AM)    colchicine  0.6 MG tablet [id number] Yes Take 1 tablet (0.6 mg total) by mouth daily as needed (for gout). Cheryle Page, MD  Active            Med Note (CRUTHIS, CHLOE C   Tue Jun 28, 2024  7:44 AM)    cyclobenzaprine  (FLEXERIL ) 10 MG tablet [id number] Yes Take 1 tablet (10 mg total) by mouth 3 (three) times daily as needed for muscle spasms. Early, Sara E, NP  Active Self           Med Note (CRUTHIS, CHLOE C   Tue Jun 28, 2024  7:44 AM)    fenofibrate  (TRICOR ) 145 MG tablet [id number]  Take 1 tablet (145 mg total) by mouth daily.  Patient not taking: Reported on 06/29/2024   Bulah Alm RAMAN, PA-C  Active   FLUoxetine  (PROZAC ) 40 MG capsule [id number] Yes TAKE 1 CAPSULE(40 MG) BY MOUTH DAILY Tysinger, Alm RAMAN, PA-C  Active   fluticasone (FLONASE) 50 MCG/ACT nasal spray [id number] Yes Place 2 sprays into both nostrils daily.  Patient taking differently: Place 2 sprays into both nostrils daily as needed.   [provider]  Active   gabapentin (NEURONTIN) 300 MG capsule [id number] Yes Take 300 mg by mouth at bedtime. Patient reported this was prescribed by [facility] provider. [provider]  Active   levothyroxine  (SYNTHROID ) 25 MCG tablet [id number] Yes TAKE 1 TABLET BY MOUTH EVERY DAY BEFORE BREAKFAST Tysinger, Alm RAMAN, PA-C  Active Self  lisinopril  (ZESTRIL ) 20 MG tablet [id number] Yes TAKE 1 TABLET(20 MG) BY MOUTH DAILY Tysinger, Alm RAMAN, PA-C  Active   meclizine  (ANTIVERT ) 25 MG tablet [id number] Yes Take 1 tablet (25 mg total) by mouth 2 (two) times daily as needed for dizziness. Cheryle Page, MD  Active            Med Note (CRUTHIS, CHLOE C   Tue Jun 28, 2024  7:44 AM)    metoprolol  succinate (TOPROL -XL) 50 MG 24 hr tablet [id number] Yes TAKE 1 TABLET BY MOUTH IMMEDIATELY WITH OR IMMEDIATELY FOLLOWING A MEAL Tysinger, Alm RAMAN, PA-C  Active   montelukast  (SINGULAIR ) 10 MG tablet [id number] Yes TAKE 1 TABLET(10 MG) BY MOUTH AT BEDTIME Tysinger, Alm RAMAN, PA-C  Active   Multiple Vitamins-Minerals (MULTIVITAMIN WITH MINERALS) tablet [id number]  Take 1 tablet by mouth daily.  Patient not taking: Reported on 06/29/2024   Bulah Alm RAMAN, PA-C  Active Self  mupirocin  ointment (BACTROBAN ) 2 % [id number]  Place 1 Application into the nose 3 (three) times daily.  Patient not taking: Reported on 06/29/2024   Bulah Alm RAMAN, PA-C  Active            Med Note (CRUTHIS, CHLOE C   Tue Jun 28, 2024  7:44  AM)    naproxen  (NAPROSYN ) 250 MG tablet [id number] Yes Take 250 mg by mouth 3 (three) times daily as needed for mild pain (pain score 1-3). [provider]  Active Self           Med Note (CRUTHIS, CHLOE C   Tue Jun 28, 2024  7:44 AM)    nitroGLYCERIN  (NITROSTAT ) 0.4 MG SL tablet [id number]  Place 1 tablet (0.4 mg total) under the tongue every 5 (five) minutes as needed for chest pain.  Patient not taking: Reported on 06/29/2024   Michele Richardson, DO  Expired 06/21/24 2359  Med Note (CRUTHIS, CHLOE C   Tue Jun 28, 2024  7:44 AM)    nystatin  (MYCOSTATIN /NYSTOP ) powder [id number] Yes Apply 1 Application topically 2 (two) times daily.  Patient taking differently: Apply 1 Application topically 2 (two) times daily. As Needed.   Tysinger, Alm RAMAN, PA-C  Active Self           Med Note (CRUTHIS, CHLOE C   Tue Jun 28, 2024  7:44 AM)    Omega-3 Fatty Acids (SALMON OIL-1000) 200 MG CAPS [id number]  Take 1 capsule by mouth daily.  Patient not taking: Reported on 06/29/2024   [provider]  Active Self  Semaglutide  (RYBELSUS ) 14 MG TABS [id number] Yes Take 1 tablet by mouth daily. [provider]  Active   sodium chloride  (OCEAN) 0.65 % SOLN nasal spray [id number] Yes Place 1 spray into both nostrils as needed for congestion. [provider]  Active Self           Med Note (CRUTHIS, CHLOE C   Tue Jun 28, 2024  7:44 AM)    tamsulosin  (FLOMAX ) 0.4 MG CAPS capsule [id number] Yes Take 0.4 mg by mouth daily. [provider]  Active             Home Care and Equipment/Supplies: Were Home Health Services Ordered?: No Any new equipment or medical supplies ordered?: No  Functional Questionnaire: Do you need assistance with bathing/showering or dressing?: No Do you need assistance with meal preparation?: No Do you need assistance with eating?: No Do you have difficulty maintaining continence: No Do you need assistance with getting out of bed/getting out  of a chair/moving?: No Do you have difficulty managing or taking your medications?: No  Follow up appointments reviewed: PCP Follow-up appointment confirmed?: Yes Date of PCP follow-up appointment?: 07/08/24 Follow-up Provider: Alm Gent at [facility]. Specialist Hospital Follow-up appointment confirmed?: Yes Date of Specialist follow-up appointment?: 07/18/24 Follow-Up Specialty Provider:: Urology Do you need transportation to your follow-up appointment?: No Do you understand care options if your condition(s) worsen?: Yes-patient verbalized understanding  SDOH Interventions Today    Flowsheet Row Most Recent Value  SDOH Interventions   Food Insecurity Interventions Intervention Not Indicated  Housing Interventions Intervention Not Indicated  Transportation Interventions Intervention Not Indicated, Patient Resources (Friends/Family)  Utilities Interventions Intervention Not Indicated  Depression Interventions/Treatment  Currently on Treatment  [related to current medical issues mostly per patient stated.]  Health Literacy Interventions Intervention Not Indicated   06/29/24: Successful TOC RN CM telephonic outreach completed with patient.  Patient offered and declined 30 day follow up program but was appreciative of call.  Patient has good understanding on verbal teach back of instructions, medications, follow up appointments, what to call providers for, and how to contact insurance/payor for any additional benefits or needs.     Bing Edison MSN, RN RN Case Sales [employer]  [facility] Office Hours [phone] [phone] Direct Dial: [phone] Main Phone [phone]  Fax: [phone] [email or web address]

## 2024-06-29 NOTE — Progress Notes (Signed)
° °  [facility] Surgical Posting Form  Surgery Date: Date: 08/01/2024  Surgeon: Dr. Redell Burnet, MD  Inpt ( No  )   Outpt (Yes)   Obs ( No  )   Diagnosis: N20.1 Right Ureteral Stone, N20.1 Left Ureteral Stone  -CPT: [phone]  Surgery: Bilateral Ureteroscopy with Laser Lithotripsy and Stent Exchange  Stop Anticoagulations: No, may continue all   Cardiac/Medical/Pulmonary Clearance needed: no  *Orders entered into EPIC  Date: 06/29/2024   *Case booked in [place]  Date: 06/29/2024  *Notified pt of Surgery: Date: 06/29/2024  PRE-OP UA & CX: yes, will obtain in clinic on 07/18/2024  *Placed into Prior Authorization Work [place] Date: 06/29/2024  Assistant/laser/rep:No

## 2024-06-30 ENCOUNTER — Encounter: Payer: Self-pay | Admitting: Medical

## 2024-06-30 NOTE — Telephone Encounter (Signed)
 Letter typed, left message for pt

## 2024-07-01 ENCOUNTER — Telehealth: Payer: Self-pay | Admitting: Internal Medicine

## 2024-07-01 ENCOUNTER — Encounter: Payer: Self-pay | Admitting: Medical

## 2024-07-01 LAB — CULTURE, BLOOD (ROUTINE X 2)
Culture: NO GROWTH
Culture: NO GROWTH
Special Requests: ADEQUATE
Special Requests: ADEQUATE

## 2024-07-01 NOTE — Telephone Encounter (Unsigned)
 Copied from CRM #[id number]. Topic: General - Other >> Jul 01, 2024  2:14 PM Myrick T wrote: Reason for CRM: patient called requested to have his return to work note sent to [employer]. His return date is Monday 07/04/24

## 2024-07-04 ENCOUNTER — Other Ambulatory Visit: Payer: Self-pay | Admitting: Medical

## 2024-07-04 MED ORDER — METOPROLOL SUCCINATE ER 50 MG PO TB24: 50.0000 mg | ORAL_TABLET | Freq: Every day | ORAL | 1 refills | Status: DC

## 2024-07-08 ENCOUNTER — Encounter: Payer: Self-pay | Admitting: Medical

## 2024-07-08 ENCOUNTER — Other Ambulatory Visit: Payer: Self-pay

## 2024-07-08 ENCOUNTER — Emergency Department (HOSPITAL_COMMUNITY)

## 2024-07-08 ENCOUNTER — Encounter (HOSPITAL_COMMUNITY): Payer: Self-pay

## 2024-07-08 ENCOUNTER — Ambulatory Visit: Admitting: Medical

## 2024-07-08 ENCOUNTER — Inpatient Hospital Stay (HOSPITAL_COMMUNITY)
Admission: EM | Admit: 2024-07-08 | Discharge: 2024-07-11 | DRG: 871 | Disposition: A | Discharge status: Home / Self Care | Attending: Student | Admitting: Student

## 2024-07-08 VITALS — BP 70/40 | HR 104 | Temp 100.5°F | Ht 68.0 in | Wt >= 6400 oz

## 2024-07-08 DIAGNOSIS — Z9289 Personal history of other medical treatment: Secondary | ICD-10-CM | POA: Diagnosis not present

## 2024-07-08 DIAGNOSIS — R52 Pain, unspecified: Secondary | ICD-10-CM

## 2024-07-08 DIAGNOSIS — Z83438 Family history of other disorder of lipoprotein metabolism and other lipidemia: Secondary | ICD-10-CM

## 2024-07-08 DIAGNOSIS — E669 Obesity, unspecified: Secondary | ICD-10-CM | POA: Diagnosis not present

## 2024-07-08 DIAGNOSIS — E039 Hypothyroidism, unspecified: Secondary | ICD-10-CM | POA: Diagnosis present

## 2024-07-08 DIAGNOSIS — N3001 Acute cystitis with hematuria: Secondary | ICD-10-CM | POA: Diagnosis present

## 2024-07-08 DIAGNOSIS — N179 Acute kidney failure, unspecified: Secondary | ICD-10-CM | POA: Diagnosis present

## 2024-07-08 DIAGNOSIS — R0689 Other abnormalities of breathing: Secondary | ICD-10-CM | POA: Diagnosis not present

## 2024-07-08 DIAGNOSIS — R6521 Severe sepsis with septic shock: Secondary | ICD-10-CM | POA: Diagnosis present

## 2024-07-08 DIAGNOSIS — E785 Hyperlipidemia, unspecified: Secondary | ICD-10-CM | POA: Diagnosis present

## 2024-07-08 DIAGNOSIS — Z91199 Patient's noncompliance with other medical treatment and regimen due to unspecified reason: Secondary | ICD-10-CM

## 2024-07-08 DIAGNOSIS — R579 Shock, unspecified: Secondary | ICD-10-CM | POA: Diagnosis not present

## 2024-07-08 DIAGNOSIS — R509 Fever, unspecified: Secondary | ICD-10-CM

## 2024-07-08 DIAGNOSIS — N390 Urinary tract infection, site not specified: Secondary | ICD-10-CM | POA: Diagnosis not present

## 2024-07-08 DIAGNOSIS — Z1611 Resistance to penicillins: Secondary | ICD-10-CM | POA: Diagnosis present

## 2024-07-08 DIAGNOSIS — D649 Anemia, unspecified: Secondary | ICD-10-CM | POA: Diagnosis present

## 2024-07-08 DIAGNOSIS — Z960 Presence of urogenital implants: Secondary | ICD-10-CM | POA: Diagnosis not present

## 2024-07-08 DIAGNOSIS — Z8249 Family history of ischemic heart disease and other diseases of the circulatory system: Secondary | ICD-10-CM | POA: Diagnosis not present

## 2024-07-08 DIAGNOSIS — Z6841 Body Mass Index (BMI) 40.0 and over, adult: Secondary | ICD-10-CM

## 2024-07-08 DIAGNOSIS — F39 Unspecified mood [affective] disorder: Secondary | ICD-10-CM | POA: Diagnosis present

## 2024-07-08 DIAGNOSIS — Z8261 Family history of arthritis: Secondary | ICD-10-CM | POA: Diagnosis not present

## 2024-07-08 DIAGNOSIS — I10 Essential (primary) hypertension: Secondary | ICD-10-CM | POA: Diagnosis not present

## 2024-07-08 DIAGNOSIS — Z1152 Encounter for screening for COVID-19: Secondary | ICD-10-CM

## 2024-07-08 DIAGNOSIS — Z823 Family history of stroke: Secondary | ICD-10-CM | POA: Diagnosis not present

## 2024-07-08 DIAGNOSIS — G4733 Obstructive sleep apnea (adult) (pediatric): Secondary | ICD-10-CM | POA: Diagnosis not present

## 2024-07-08 DIAGNOSIS — A419 Sepsis, unspecified organism: Secondary | ICD-10-CM | POA: Diagnosis not present

## 2024-07-08 DIAGNOSIS — R6883 Chills (without fever): Secondary | ICD-10-CM

## 2024-07-08 DIAGNOSIS — E860 Dehydration: Secondary | ICD-10-CM | POA: Diagnosis not present

## 2024-07-08 DIAGNOSIS — Z79890 Hormone replacement therapy (postmenopausal): Secondary | ICD-10-CM

## 2024-07-08 DIAGNOSIS — R0989 Other specified symptoms and signs involving the circulatory and respiratory systems: Secondary | ICD-10-CM | POA: Diagnosis not present

## 2024-07-08 DIAGNOSIS — Z888 Allergy status to other drugs, medicaments and biological substances status: Secondary | ICD-10-CM

## 2024-07-08 DIAGNOSIS — E119 Type 2 diabetes mellitus without complications: Secondary | ICD-10-CM | POA: Diagnosis present

## 2024-07-08 DIAGNOSIS — A4151 Sepsis due to Escherichia coli [E. coli]: Secondary | ICD-10-CM | POA: Diagnosis not present

## 2024-07-08 DIAGNOSIS — E872 Acidosis, unspecified: Secondary | ICD-10-CM | POA: Diagnosis present

## 2024-07-08 DIAGNOSIS — Z9989 Dependence on other enabling machines and devices: Secondary | ICD-10-CM | POA: Diagnosis not present

## 2024-07-08 DIAGNOSIS — Z87891 Personal history of nicotine dependence: Secondary | ICD-10-CM

## 2024-07-08 DIAGNOSIS — Z833 Family history of diabetes mellitus: Secondary | ICD-10-CM | POA: Diagnosis not present

## 2024-07-08 DIAGNOSIS — Z87440 Personal history of urinary (tract) infections: Secondary | ICD-10-CM | POA: Diagnosis not present

## 2024-07-08 DIAGNOSIS — E038 Other specified hypothyroidism: Secondary | ICD-10-CM

## 2024-07-08 DIAGNOSIS — N200 Calculus of kidney: Secondary | ICD-10-CM | POA: Diagnosis not present

## 2024-07-08 DIAGNOSIS — Z806 Family history of leukemia: Secondary | ICD-10-CM

## 2024-07-08 DIAGNOSIS — N10 Acute tubulo-interstitial nephritis: Secondary | ICD-10-CM | POA: Diagnosis not present

## 2024-07-08 DIAGNOSIS — Z87442 Personal history of urinary calculi: Secondary | ICD-10-CM

## 2024-07-08 DIAGNOSIS — I959 Hypotension, unspecified: Secondary | ICD-10-CM

## 2024-07-08 DIAGNOSIS — Z820 Family history of epilepsy and other diseases of the nervous system: Secondary | ICD-10-CM

## 2024-07-08 DIAGNOSIS — B9620 Unspecified Escherichia coli [E. coli] as the cause of diseases classified elsewhere: Secondary | ICD-10-CM | POA: Diagnosis not present

## 2024-07-08 DIAGNOSIS — R000 Tachycardia, unspecified: Secondary | ICD-10-CM | POA: Diagnosis not present

## 2024-07-08 DIAGNOSIS — N202 Calculus of kidney with calculus of ureter: Secondary | ICD-10-CM | POA: Diagnosis not present

## 2024-07-08 LAB — COMPREHENSIVE METABOLIC PANEL WITH GFR
ALT: 42 U/L (ref 0–44)
AST: 50 U/L — ABNORMAL HIGH (ref 15–41)
Albumin: 3.4 g/dL — ABNORMAL LOW (ref 3.5–5.0)
Alkaline Phosphatase: 121 U/L (ref 38–126)
Anion gap: 11 (ref 5–15)
BUN: 18 mg/dL (ref 6–20)
CO2: 23 mmol/L (ref 22–32)
Calcium: 9.1 mg/dL (ref 8.9–10.3)
Chloride: 99 mmol/L (ref 98–111)
Creatinine, Ser: 1.17 mg/dL (ref 0.61–1.24)
GFR, Estimated: >60 mL/min
Glucose, Bld: 152 mg/dL — ABNORMAL HIGH (ref 70–99)
Potassium: 4.5 mmol/L (ref 3.5–5.1)
Sodium: 133 mmol/L — ABNORMAL LOW (ref 135–145)
Total Bilirubin: 1.4 mg/dL — ABNORMAL HIGH (ref 0.0–1.2)
Total Protein: 7.1 g/dL (ref 6.5–8.1)

## 2024-07-08 LAB — RESP PANEL BY RT-PCR (RSV, FLU A&B, COVID)  RVPGX2
Influenza A by PCR: NEGATIVE
Influenza B by PCR: NEGATIVE
Resp Syncytial Virus by PCR: NEGATIVE
SARS Coronavirus 2 by RT PCR: NEGATIVE

## 2024-07-08 LAB — BLOOD GAS, ARTERIAL
Acid-Base Excess: 2 mmol/L (ref 0.0–2.0)
Bicarbonate: 25.6 mmol/L (ref 20.0–28.0)
Delivery systems: 21
Drawn by: 74501
FIO2: 21 %
O2 Saturation: 99.3 %
Patient temperature: 37.3
pCO2 arterial: 36 mmHg (ref 32–48)
pH, Arterial: 7.46 — ABNORMAL HIGH (ref 7.35–7.45)
pO2, Arterial: 77 mmHg — ABNORMAL LOW (ref 83–108)

## 2024-07-08 LAB — URINALYSIS, W/ REFLEX TO CULTURE (INFECTION SUSPECTED)
Bilirubin Urine: NEGATIVE
Glucose, UA: NEGATIVE mg/dL
Ketones, ur: NEGATIVE mg/dL
Nitrite: POSITIVE — AB
Protein, ur: 100 mg/dL — AB
Specific Gravity, Urine: 1.009 (ref 1.005–1.030)
WBC, UA: >50 WBC/hpf (ref 0–5)
pH: 6 (ref 5.0–8.0)

## 2024-07-08 LAB — CBC WITH DIFFERENTIAL/PLATELET
Abs Immature Granulocytes: 0.22 K/uL — ABNORMAL HIGH (ref 0.00–0.07)
Basophils Absolute: 0.1 K/uL (ref 0.0–0.1)
Basophils Relative: 0 %
Eosinophils Absolute: 0.1 K/uL (ref 0.0–0.5)
Eosinophils Relative: 0 %
HCT: 36.5 % — ABNORMAL LOW (ref 39.0–52.0)
Hemoglobin: 12.1 g/dL — ABNORMAL LOW (ref 13.0–17.0)
Immature Granulocytes: 1 %
Lymphocytes Relative: 3 %
Lymphs Abs: 0.6 K/uL — ABNORMAL LOW (ref 0.7–4.0)
MCH: 30.7 pg (ref 26.0–34.0)
MCHC: 33.2 g/dL (ref 30.0–36.0)
MCV: 92.6 fL (ref 80.0–100.0)
Monocytes Absolute: 1.6 K/uL — ABNORMAL HIGH (ref 0.1–1.0)
Monocytes Relative: 7 %
Neutro Abs: 19.6 K/uL — ABNORMAL HIGH (ref 1.7–7.7)
Neutrophils Relative %: 89 %
Platelets: 143 K/uL — ABNORMAL LOW (ref 150–400)
RBC: 3.94 MIL/uL — ABNORMAL LOW (ref 4.22–5.81)
RDW: 15.5 % (ref 11.5–15.5)
WBC: 22.2 K/uL — ABNORMAL HIGH (ref 4.0–10.5)
nRBC: 0 % (ref 0.0–0.2)

## 2024-07-08 LAB — LACTIC ACID, PLASMA: Lactic Acid, Venous: 2.7 mmol/L (ref 0.5–1.9)

## 2024-07-08 LAB — POCT CBG (FASTING - GLUCOSE)-MANUAL ENTRY: Glucose Fasting, POC: 164 mg/dL — AB (ref 70–99)

## 2024-07-08 LAB — PROTIME-INR
INR: 1.4 — ABNORMAL HIGH (ref 0.8–1.2)
Prothrombin Time: 17.4 s — ABNORMAL HIGH (ref 11.4–15.2)

## 2024-07-08 LAB — I-STAT CG4 LACTIC ACID, ED: Lactic Acid, Venous: 3 mmol/L (ref 0.5–1.9)

## 2024-07-08 LAB — POCT INFLUENZA A/B
Influenza A, POC: NEGATIVE
Influenza B, POC: NEGATIVE

## 2024-07-08 LAB — POC COVID19 BINAXNOW: SARS Coronavirus 2 Ag: NEGATIVE

## 2024-07-08 MED ORDER — IBUPROFEN 800 MG PO TABS
800.0000 mg | ORAL_TABLET | Freq: Four times a day (QID) | ORAL | Status: DC | PRN
  Administered 2024-07-08: 800 mg via ORAL
  Filled 2024-07-08: qty 1

## 2024-07-08 MED ORDER — LACTATED RINGERS IV BOLUS
1000.0000 mL | Freq: Once | INTRAVENOUS | Status: AC
  Administered 2024-07-08: 1000 mL via INTRAVENOUS

## 2024-07-08 MED ORDER — ONDANSETRON HCL 4 MG/2ML IJ SOLN: 4.0000 mg | Freq: Four times a day (QID) | INTRAMUSCULAR | Status: DC | PRN

## 2024-07-08 MED ORDER — NOREPINEPHRINE 4 MG/250ML-% IV SOLN
INTRAVENOUS | Status: AC
  Administered 2024-07-08: 5 ug/min via INTRAVENOUS
  Filled 2024-07-08: qty 250

## 2024-07-08 MED ORDER — HEPARIN SODIUM (PORCINE) 5000 UNIT/ML IJ SOLN
5000.0000 [IU] | Freq: Three times a day (TID) | INTRAMUSCULAR | Status: DC
  Administered 2024-07-08 – 2024-07-10 (×6): 5000 [IU] via SUBCUTANEOUS
  Filled 2024-07-08 (×6): qty 1

## 2024-07-08 MED ORDER — ONDANSETRON HCL 4 MG PO TABS: 4.0000 mg | ORAL_TABLET | Freq: Four times a day (QID) | ORAL | Status: DC | PRN

## 2024-07-08 MED ORDER — LACTATED RINGERS IV SOLN: INTRAVENOUS | Status: AC

## 2024-07-08 MED ORDER — SODIUM CHLORIDE 0.9 % IV SOLN
2.0000 g | Freq: Once | INTRAVENOUS | Status: AC
  Administered 2024-07-08: 2 g via INTRAVENOUS
  Filled 2024-07-08: qty 12.5

## 2024-07-08 MED ORDER — SODIUM CHLORIDE 0.9 % IV SOLN
250.0000 mL | INTRAVENOUS | Status: AC
  Administered 2024-07-08: 250 mL via INTRAVENOUS

## 2024-07-08 MED ORDER — BISACODYL 5 MG PO TBEC: 5.0000 mg | DELAYED_RELEASE_TABLET | Freq: Every day | ORAL | Status: DC | PRN

## 2024-07-08 MED ORDER — SODIUM CHLORIDE 0.9 % IV SOLN
2.0000 g | Freq: Three times a day (TID) | INTRAVENOUS | Status: DC
  Administered 2024-07-08 – 2024-07-10 (×6): 2 g via INTRAVENOUS
  Filled 2024-07-08 (×6): qty 12.5

## 2024-07-08 MED ORDER — SODIUM CHLORIDE 0.9 % IV SOLN: 2.0000 g | Freq: Two times a day (BID) | INTRAVENOUS | Status: DC

## 2024-07-08 MED ORDER — ACETAMINOPHEN 325 MG PO TABS
650.0000 mg | ORAL_TABLET | Freq: Once | ORAL | Status: AC
  Administered 2024-07-08: 650 mg via ORAL
  Filled 2024-07-08: qty 2

## 2024-07-08 MED ORDER — LACTATED RINGERS IV BOLUS (SEPSIS)
1000.0000 mL | Freq: Once | INTRAVENOUS | Status: AC
  Administered 2024-07-08: 1000 mL via INTRAVENOUS

## 2024-07-08 MED ORDER — ENOXAPARIN SODIUM 100 MG/ML IJ SOSY
90.0000 mg | PREFILLED_SYRINGE | Freq: Every day | INTRAMUSCULAR | Status: DC
  Filled 2024-07-08: qty 1

## 2024-07-08 MED ORDER — LACTATED RINGERS IV BOLUS (SEPSIS)
200.0000 mL | Freq: Once | INTRAVENOUS | Status: AC
  Administered 2024-07-08: 200 mL via INTRAVENOUS

## 2024-07-08 MED ORDER — ALLOPURINOL 100 MG PO TABS
300.0000 mg | ORAL_TABLET | Freq: Every day | ORAL | Status: DC
  Administered 2024-07-09 – 2024-07-11 (×3): 300 mg via ORAL
  Filled 2024-07-08 (×2): qty 3

## 2024-07-08 MED ORDER — TAMSULOSIN HCL 0.4 MG PO CAPS
0.4000 mg | ORAL_CAPSULE | Freq: Every day | ORAL | Status: DC
  Administered 2024-07-09 – 2024-07-11 (×3): 0.4 mg via ORAL
  Filled 2024-07-08 (×2): qty 1

## 2024-07-08 MED ORDER — CHLORHEXIDINE GLUCONATE CLOTH 2 % EX PADS
6.0000 | MEDICATED_PAD | Freq: Every day | CUTANEOUS | Status: DC
  Administered 2024-07-08 – 2024-07-09 (×2): 6 via TOPICAL

## 2024-07-08 MED ORDER — ORAL CARE MOUTH RINSE: 15.0000 mL | OROMUCOSAL | Status: DC | PRN

## 2024-07-08 MED ORDER — LEVOTHYROXINE SODIUM 25 MCG PO TABS
25.0000 ug | ORAL_TABLET | Freq: Every day | ORAL | Status: DC
  Administered 2024-07-09 – 2024-07-11 (×3): 25 ug via ORAL
  Filled 2024-07-08 (×2): qty 1

## 2024-07-08 MED ORDER — ACETAMINOPHEN 650 MG RE SUPP: 650.0000 mg | Freq: Four times a day (QID) | RECTAL | Status: DC | PRN

## 2024-07-08 MED ORDER — HYDROCORTISONE SOD SUC (PF) 100 MG IJ SOLR
100.0000 mg | Freq: Two times a day (BID) | INTRAMUSCULAR | Status: DC
  Administered 2024-07-08 – 2024-07-10 (×4): 100 mg via INTRAVENOUS
  Filled 2024-07-08 (×4): qty 2

## 2024-07-08 MED ORDER — LACTATED RINGERS IV SOLN: INTRAVENOUS | Status: DC

## 2024-07-08 MED ORDER — NOREPINEPHRINE 4 MG/250ML-% IV SOLN: 0.0000 ug/min | INTRAVENOUS | Status: DC

## 2024-07-08 MED ORDER — NOREPINEPHRINE 16 MG/250ML-% IV SOLN: 0.0000 ug/min | INTRAVENOUS | Status: DC

## 2024-07-08 MED ORDER — ACETAMINOPHEN 325 MG PO TABS
650.0000 mg | ORAL_TABLET | Freq: Four times a day (QID) | ORAL | Status: DC | PRN
  Administered 2024-07-09 – 2024-07-11 (×3): 650 mg via ORAL
  Filled 2024-07-08 (×2): qty 2

## 2024-07-08 MED ORDER — FLUOXETINE HCL 20 MG PO CAPS
40.0000 mg | ORAL_CAPSULE | Freq: Every day | ORAL | Status: DC
  Administered 2024-07-09 – 2024-07-11 (×3): 40 mg via ORAL
  Filled 2024-07-08 (×3): qty 2

## 2024-07-08 NOTE — ED Provider Notes (Signed)
 " [place] EMERGENCY DEPARTMENT AT [facility] Provider Note   CSN: [id number] Arrival date & time: 07/08/24  1141     Patient presents with: Emesis   Patrick Baxter is a 53 y.o. male.    Emesis    Pt states he started to feel poorly again yesterday afternoon with aches, feverish.  Pt had been in the hospital recently for sepsis, uti, kidney stones.  Pt followed up with his doctor today and they noticed that his blood pressure was low.  Pt has some urinary pressure still but no pain with urination.No cough, chest pain or abd pain.    Prior to Admission medications  Medication Sig Start Date End Date Taking? Authorizing Provider  acetaminophen  (TYLENOL ) 500 MG tablet Take 1,500 mg by mouth every 6 (six) hours as needed.    [provider]  albuterol  (VENTOLIN  HFA) 108 (90 Base) MCG/ACT inhaler Inhale 2 puffs into the lungs every 6 (six) hours as needed for wheezing or shortness of breath. 12/01/22   Tysinger, Alm RAMAN, PA-C  allopurinol  (ZYLOPRIM ) 300 MG tablet TAKE 1 TABLET(300 MG) BY MOUTH DAILY 03/09/24   Tysinger, Alm RAMAN, PA-C  B Complex-C (PROBEC-T PO) Take 1 tablet by mouth daily.    [provider]  Calcium -Magnesium-Zinc [phone] MG TABS Take 1 tablet by mouth daily. Patient not taking: Reported on 07/08/2024    [provider]  cetirizine (ZYRTEC) 10 MG tablet Take 10 mg by mouth daily. Patient not taking: Reported on 07/08/2024    [provider]  cholecalciferol (VITAMIN D3) 25 MCG (1000 UNIT) tablet Take 1 tablet (1,000 Units total) by mouth daily. 12/01/22   Tysinger, Alm RAMAN, PA-C  clotrimazole -betamethasone  (LOTRISONE ) cream Apply 1 Application topically daily. Patient not taking: Reported on 07/08/2024 12/11/23   Tysinger, Alm RAMAN, PA-C  Coenzyme Q10 (CO Q 10 PO) Take 1 tablet by mouth daily at 6 (six) AM. Patient not taking: Reported on 07/08/2024    [provider]  colchicine  0.6 MG tablet Take 1 tablet (0.6 mg  total) by mouth daily as needed (for gout). 01/07/24   Cheryle Page, MD  cyclobenzaprine  (FLEXERIL ) 10 MG tablet Take 1 tablet (10 mg total) by mouth 3 (three) times daily as needed for muscle spasms. 01/01/24   Early, Sara E, NP  fenofibrate  (TRICOR ) 145 MG tablet Take 1 tablet (145 mg total) by mouth daily. Patient not taking: Reported on 07/08/2024 05/13/24 05/13/25  Tysinger, Alm RAMAN, PA-C  FLUoxetine  (PROZAC ) 40 MG capsule TAKE 1 CAPSULE(40 MG) BY MOUTH DAILY 06/29/24   Tysinger, Alm RAMAN, PA-C  fluticasone [facility]) 50 MCG/ACT nasal spray Place 2 sprays into both nostrils daily. Patient not taking: Reported on 07/08/2024    [provider]  gabapentin (NEURONTIN) 300 MG capsule Take 300 mg by mouth at bedtime. Patient reported this was prescribed by [facility] provider.    [provider]  levothyroxine  (SYNTHROID ) 25 MCG tablet TAKE 1 TABLET BY MOUTH EVERY DAY BEFORE BREAKFAST 12/14/23   Tysinger, Alm RAMAN, PA-C  lisinopril  (ZESTRIL ) 20 MG tablet TAKE 1 TABLET(20 MG) BY MOUTH DAILY 03/15/24   Tysinger, Alm RAMAN, PA-C  meclizine  (ANTIVERT ) 25 MG tablet Take 1 tablet (25 mg total) by mouth 2 (two) times daily as needed for dizziness. 01/07/24   Cheryle Page, MD  metoprolol  succinate (TOPROL -XL) 50 MG 24 hr tablet Take 1 tablet (50 mg total) by mouth daily. 07/04/24   Tysinger, Alm RAMAN, PA-C  montelukast  (SINGULAIR ) 10 MG tablet TAKE  1 TABLET(10 MG) BY MOUTH AT BEDTIME 01/04/24   Tysinger, Alm RAMAN, PA-C  Multiple Vitamins-Minerals (MULTIVITAMIN WITH MINERALS) tablet Take 1 tablet by mouth daily. Patient not taking: Reported on 07/08/2024 12/01/22   Tysinger, Alm RAMAN, PA-C  mupirocin  ointment (BACTROBAN ) 2 % Place 1 Application into the nose 3 (three) times daily. Patient not taking: Reported on 07/08/2024 02/01/24   Tysinger, Alm RAMAN, PA-C  naproxen  (NAPROSYN ) 250 MG tablet Take 250 mg by mouth 3 (three) times daily as needed for mild pain (pain score 1-3).    [provider]   nitroGLYCERIN  (NITROSTAT ) 0.4 MG SL tablet Place 1 tablet (0.4 mg total) under the tongue every 5 (five) minutes as needed for chest pain. 08/30/19 07/08/24  Tolia, Sunit, DO  nystatin  (MYCOSTATIN /NYSTOP ) powder Apply 1 Application topically 2 (two) times daily. 12/11/23   Tysinger, Alm RAMAN, PA-C  Omega-3 Fatty Acids (SALMON OIL-1000) 200 MG CAPS Take 1 capsule by mouth daily.    [provider]  oxybutynin  (DITROPAN -XL) 10 MG 24 hr tablet Take 1 tablet (10 mg total) by mouth daily. 06/29/24   Francisca Redell BROCKS, MD  Semaglutide  (RYBELSUS ) 14 MG TABS Take 1 tablet by mouth daily.    [provider]  sodium chloride  (OCEAN) 0.65 % SOLN nasal spray Place 1 spray into both nostrils as needed for congestion.    [provider]  tamsulosin  (FLOMAX ) 0.4 MG CAPS capsule Take 0.4 mg by mouth daily.    [provider]    Allergies: Atorvastatin  and Pravastatin  sodium    Review of Systems  Gastrointestinal:  Positive for vomiting.    Updated Vital Signs BP 122/68   Pulse (!) 104   Temp 99.1 F (37.3 C) (Oral)   Resp 19   Ht 1.727 m (5' 8)   Wt (!) 181.9 kg   SpO2 98%   BMI 60.97 kg/m   Physical Exam Vitals and nursing note reviewed.  Constitutional:      Appearance: He is well-developed. He is ill-appearing. He is not diaphoretic.  HENT:     Head: Normocephalic and atraumatic.     Right Ear: External ear normal.     Left Ear: External ear normal.  Eyes:     General: No scleral icterus.       Right eye: No discharge.        Left eye: No discharge.     Conjunctiva/sclera: Conjunctivae normal.  Neck:     Trachea: No tracheal deviation.  Cardiovascular:     Rate and Rhythm: Normal rate and regular rhythm.  Pulmonary:     Effort: Pulmonary effort is normal. No respiratory distress.     Breath sounds: Normal breath sounds. No stridor. No wheezing or rales.  Abdominal:     General: Bowel sounds are normal. There is no distension.     Palpations:  Abdomen is soft.     Tenderness: There is no abdominal tenderness. There is no guarding or rebound.  Musculoskeletal:        General: No tenderness or deformity.     Cervical back: Neck supple.  Skin:    General: Skin is warm and dry.     Findings: No rash.  Neurological:     General: No focal deficit present.     Mental Status: He is alert.     Cranial Nerves: No cranial nerve deficit, dysarthria or facial asymmetry.     Sensory: No sensory deficit.     Motor: No abnormal muscle tone  or seizure activity.     Coordination: Coordination normal.  Psychiatric:        Mood and Affect: Mood normal.     (all labs ordered are listed, but only abnormal results are displayed) Labs Reviewed  COMPREHENSIVE METABOLIC PANEL WITH GFR - Abnormal; Notable for the following components:      Result Value   Sodium 133 (*)    Glucose, Bld 152 (*)    Albumin 3.4 (*)    AST 50 (*)    Total Bilirubin 1.4 (*)    All other components within normal limits  CBC WITH DIFFERENTIAL/PLATELET - Abnormal; Notable for the following components:   WBC 22.2 (*)    RBC 3.94 (*)    Hemoglobin 12.1 (*)    HCT 36.5 (*)    Platelets 143 (*)    Neutro Abs 19.6 (*)    Lymphs Abs 0.6 (*)    Monocytes Absolute 1.6 (*)    Abs Immature Granulocytes 0.22 (*)    All other components within normal limits  URINALYSIS, W/ REFLEX TO CULTURE (INFECTION SUSPECTED) - Abnormal; Notable for the following components:   Color, Urine AMBER (*)    APPearance TURBID (*)    Hgb urine dipstick MODERATE (*)    Protein, ur 100 (*)    Nitrite POSITIVE (*)    Leukocytes,Ua LARGE (*)    Bacteria, UA MANY (*)    All other components within normal limits  I-STAT CG4 LACTIC ACID, ED - Abnormal; Notable for the following components:   Lactic Acid, Venous 3.0 (*)    All other components within normal limits  CULTURE, BLOOD (ROUTINE X 2)  RESP PANEL BY RT-PCR (RSV, FLU A&B, COVID)  [id number]  CULTURE, BLOOD (ROUTINE X 2)  URINE CULTURE   PROTIME-INR  I-STAT CG4 LACTIC ACID, ED  I-STAT CG4 LACTIC ACID, ED  I-STAT CG4 LACTIC ACID, ED    EKG: EKG Interpretation Date/Time:  Friday July 08 2024 12:04:44 EST Ventricular Rate:  104 PR Interval:  167 QRS Duration:  137 QT Interval:  344 QTC Calculation: 453 R Axis:   49  Text Interpretation: Sinus tachycardia Right bundle branch block Since last tracing rate faster Confirmed by Randol Simmonds [phone]) on 07/08/2024 12:15:45 PM  Radiology: [facility] Chest Port 1 View if patient is in a treatment room. Result Date: 07/08/2024 CLINICAL DATA:  Concern for sepsis. EXAM: PORTABLE CHEST 1 VIEW COMPARISON:  06/26/2024. FINDINGS: Low lung volumes. The heart size and mediastinal contours are within normal limits. No appreciable focal consolidation, sizeable pleural effusion, or pneumothorax. No acute osseous abnormality. IMPRESSION: Low lung volumes.  No acute cardiopulmonary findings. Electronically Signed   By: Harrietta Sherry M.D.   On: 07/08/2024 13:53     .Critical Care  Performed by: Randol Simmonds, MD Authorized by: Randol Simmonds, MD   Critical care provider statement:    Critical care time (minutes):  40   Critical care was time spent personally by me on the following activities:  Development of treatment plan with patient or surrogate, discussions with consultants, evaluation of patient's response to treatment, examination of patient, ordering and review of laboratory studies, ordering and review of radiographic studies, ordering and performing treatments and interventions, pulse oximetry, re-evaluation of patient's condition and review of old charts    Medications Ordered in the ED  lactated ringers  infusion ( Intravenous New Bag/Given 07/08/24 1403)  lactated ringers  bolus 1,000 mL (0 mLs Intravenous Stopped 07/08/24 1256)    And  lactated ringers   bolus 1,000 mL (0 mLs Intravenous Stopped 07/08/24 1259)    And  lactated ringers  bolus 200 mL (0 mLs Intravenous Stopped 07/08/24  1253)  ceFEPIme  (MAXIPIME ) 2 g in sodium chloride  0.9 % 100 mL IVPB (0 g Intravenous Stopped 07/08/24 1314)    Clinical Course as of 07/08/24 1634  Fri Jul 08, 2024  1327 CBC with Differential(!) White blood cell count elevated at 22.  Metabolic panel shows slight increase in bilirubin [JK]  1414 Blood pressure is improving with IV hydration. [JK]  1414 DG Chest Port 1 View if patient is in a treatment room. Chest x-ray without pneumonia [JK]  1419 Lactic acid level elevated at 3.9. [JK]  1528 CT scan does show findings consistent with urinary tract infection [JK]  1529 Blood pressure has improved with IV hydration. [JK]  1604 Case discussed with Dr. Cam.  He will see patient in consultation during his hospitalization [JK]  1634 Case discussed with Dr Arthea [JK]    Clinical Course User Index [JK] Randol Simmonds, MD                                 Medical Decision Making Problems Addressed: Acute cystitis with hematuria: acute illness or injury that poses a threat to life or bodily functions Sepsis, due to unspecified organism, unspecified whether acute organ dysfunction present [facility]): acute illness or injury that poses a threat to life or bodily functions  Amount and/or Complexity of Data Reviewed Labs: ordered. Decision-making details documented in ED Course. Radiology: ordered and independent interpretation performed. Decision-making details documented in ED Course.  Risk Prescription drug management. Decision regarding hospitalization.   Patient presented to the ED with weakness hypotension.  Presentation concerning for the possibility of sepsis.  Laboratory test did not show any signs of acute blood loss anemia.  No cardiac dysrhythmia noted in the ED.  No signs of acute renal failure or severe dehydration.  Patient did have significant elevation of white blood cell count as well as lactic acid level.  His urinalysis is consistent with recurrent urinary tract  infection.  Patient was treated with IV antibiotics and IV fluids for sepsis protocol.  Blood pressure has improved in the ED with IV fluid resuscitation.  Less recent blood pressure now 122/68.  CT scan is pending to rule out any recurrent ureteral obstruction.  Patient currently does have stents in place.     Final diagnoses:  Sepsis, due to unspecified organism, unspecified whether acute organ dysfunction present [facility])  Acute cystitis with hematuria    ED Discharge Orders     None          Randol Simmonds, MD 07/08/24 1634  "

## 2024-07-08 NOTE — H&P (Addendum)
 " History and Physical    Patient: Patrick Baxter FMW:[id number] DOB: [birth year] DOA: 07/08/2024 DOS: the patient was seen and examined on 07/08/2024 PCP: Bulah Alm RAMAN, PA-C  Patient coming from: Home  Chief Complaint:  Chief Complaint  Patient presents with   Emesis   HPI: Patrick Baxter is a 53 y.o. male with medical history significant for hospitalization December 14 through 16 for sepsis and UTI and ureteral stent placement.  He also has a history of OSA on CPAP, type 2 diabetes mellitus, hypertension, hypothyroidism, obesity, and  depression. The patient reports he felt better after discharge and went back to work.  He was doing fine until yesterday when he developed fever, shaking chills, diffuse body aches and severe fatigue.  He also had increased urinary frequency.  He recognized the symptoms from before so he went to his primary care provider's office.  He says his temperature at home was 102.  In the emergency department his white blood cell count was 22.  In the office his blood pressure was 70/40 so he was sent to the ED. In the emergency department on arrival the patient's blood pressure was 88/49.  His heart rate was 107.  Initially his temperature was just 99.9.  Later in the emergency department his temp went up to 102.  He was given IV fluids in the ED and his blood pressure did improve. His lactic acid level was initially 3.9 and later went down to 3.0.  He was put in the sepsis protocol. He will be admitted to the hospital service for further antibiotics and management.  His urine culture from 2 weeks ago grew greater than 100,000 E. coli which was sensitive to Maxipime  and Rocephin  but not sensitive to Bactrim or Cipro .  The patient we admitted to the stepdown unit because of his unstable blood pressure.   Review of Systems: As mentioned in the history of present illness. All other systems reviewed and are negative. Past Medical History:  Diagnosis Date   Allergy     Anxiety    Back pain    Bilateral chronic knee pain    Chronic headaches    Depression    Dermatographism    Dry skin    Fatigue    Frequent urination    Gout    Hx of echocardiogram 09/07/2019   normal LV function, EF 60-65%, no wall motion abnormality; Dr. Michele Richardson   Hyperlipidemia    Hypertension    Joint pain    Knee pain    Obesity    Pre-diabetes    Sleep apnea    Stress    Wears glasses    Past Surgical History:  Procedure Laterality Date   CYSTOSCOPY WITH RETROGRADE PYELOGRAM, URETEROSCOPY AND STENT PLACEMENT Bilateral 06/26/2024   Procedure: CYSTOURETEROSCOPY, WITH RETROGRADE PYELOGRAM AND STENT INSERTION;  Surgeon: Francisca Redell BROCKS, MD;  Location: [facility];  Service: Urology;  Laterality: Bilateral;   IR INJECT/THERA/INC NEEDLE/CATH/PLC EPI/LUMB/SAC W/IMG  09/14/2020   IR INJECT/THERA/INC NEEDLE/CATH/PLC EPI/LUMB/SAC W/IMG  09/14/2020   KNEE SURGERY  11/2006   right knee arthroscopy and meniscal repair, Dr. Rendell   Social History:  reports that he has quit smoking. His smoking use included cigarettes. He has never used smokeless tobacco. He reports that he does not currently use alcohol after a past usage of about 3.0 standard drinks of alcohol per week. He reports that he does not use drugs.  Allergies[1]  Family History  Problem Relation Age of Onset  Alzheimer's disease Mother    Heart disease Mother        57, angioplasty, CAD   Cancer Mother        breast   Stroke Mother    Hypertension Mother    Hyperlipidemia Mother    Heart disease Father        angina   Arthritis Father    Cancer Maternal Grandmother        leukemia   Diabetes Paternal Grandmother     Prior to Admission medications  Medication Sig Start Date End Date Taking? Authorizing Provider  acetaminophen  (TYLENOL ) 500 MG tablet Take 1,000 mg by mouth every 6 (six) hours as needed for mild pain (pain score 1-3) or fever.   Yes [provider]  ALEVE  220 MG tablet Take 220-440 mg by  mouth 2 (two) times daily as needed (for mild pain).   Yes [provider]  allopurinol  (ZYLOPRIM ) 300 MG tablet TAKE 1 TABLET(300 MG) BY MOUTH DAILY Patient taking differently: Take 300 mg by mouth in the morning. 03/09/24  Yes Tysinger, Alm RAMAN, PA-C  cetirizine (ZYRTEC) 10 MG tablet Take 10 mg by mouth daily as needed for allergies or rhinitis.   Yes [provider]  cholecalciferol (VITAMIN D3) 25 MCG (1000 UNIT) tablet Take 1 tablet (1,000 Units total) by mouth daily. 12/01/22  Yes Tysinger, Alm RAMAN, PA-C  colchicine  0.6 MG tablet Take 1 tablet (0.6 mg total) by mouth daily as needed (for gout). Patient taking differently: Take 0.6 mg by mouth daily as needed (as directed for gout flares). 01/07/24  Yes Cheryle Page, MD  cyclobenzaprine  (FLEXERIL ) 10 MG tablet Take 1 tablet (10 mg total) by mouth 3 (three) times daily as needed for muscle spasms. 01/01/24  Yes Early, Sara E, NP  FLUoxetine  (PROZAC ) 40 MG capsule TAKE 1 CAPSULE(40 MG) BY MOUTH DAILY Patient taking differently: Take 40 mg by mouth in the morning. 06/29/24  Yes Tysinger, Alm RAMAN, PA-C  gabapentin (NEURONTIN) 300 MG capsule Take 300 mg by mouth at bedtime.   Yes [provider]  levothyroxine  (SYNTHROID ) 25 MCG tablet TAKE 1 TABLET BY MOUTH EVERY DAY BEFORE BREAKFAST Patient taking differently: Take 25 mcg by mouth daily before breakfast. 12/14/23  Yes Tysinger, Alm RAMAN, PA-C  lisinopril  (ZESTRIL ) 20 MG tablet TAKE 1 TABLET(20 MG) BY MOUTH DAILY 03/15/24  Yes Tysinger, Alm RAMAN, PA-C  metoprolol  succinate (TOPROL -XL) 50 MG 24 hr tablet Take 1 tablet (50 mg total) by mouth daily. 07/04/24  Yes Tysinger, Alm RAMAN, PA-C  montelukast  (SINGULAIR ) 10 MG tablet TAKE 1 TABLET(10 MG) BY MOUTH AT BEDTIME 01/04/24  Yes Tysinger, Alm RAMAN, PA-C  nitroGLYCERIN  (NITROSTAT ) 0.4 MG SL tablet Place 1 tablet (0.4 mg total) under the tongue every 5 (five) minutes as needed for chest pain. 08/30/19 07/08/24 Yes Tolia, Sunit, DO   oxybutynin  (DITROPAN -XL) 10 MG 24 hr tablet Take 1 tablet (10 mg total) by mouth daily. 06/29/24  Yes Francisca Redell BROCKS, MD  RYBELSUS  14 MG TABS Take 14 mg by mouth in the morning.   Yes [provider]  sodium chloride  (OCEAN) 0.65 % SOLN nasal spray Place 1 spray into both nostrils as needed for congestion.   Yes [provider]  tamsulosin  (FLOMAX ) 0.4 MG CAPS capsule Take 0.4 mg by mouth daily.   Yes [provider]  albuterol  (VENTOLIN  HFA) 108 (90 Base) MCG/ACT inhaler Inhale 2 puffs into the lungs every 6 (six) hours as needed for wheezing or shortness  of breath. Patient not taking: Reported on 07/08/2024 12/01/22   Tysinger, Alm RAMAN, PA-C  clotrimazole -betamethasone  (LOTRISONE ) cream Apply 1 Application topically daily. 12/11/23   Tysinger, Alm RAMAN, PA-C  fenofibrate  (TRICOR ) 145 MG tablet Take 1 tablet (145 mg total) by mouth daily. Patient not taking: Reported on 07/08/2024 05/13/24 05/13/25  Tysinger, Alm RAMAN, PA-C  meclizine  (ANTIVERT ) 25 MG tablet Take 1 tablet (25 mg total) by mouth 2 (two) times daily as needed for dizziness. Patient not taking: Reported on 07/08/2024 01/07/24   Cheryle Page, MD  Multiple Vitamins-Minerals (MULTIVITAMIN WITH MINERALS) tablet Take 1 tablet by mouth daily. Patient not taking: Reported on 07/08/2024 12/01/22   Tysinger, Alm RAMAN, PA-C  mupirocin  ointment (BACTROBAN ) 2 % Place 1 Application into the nose 3 (three) times daily. Patient not taking: Reported on 07/08/2024 02/01/24   Tysinger, Alm RAMAN, PA-C  nystatin  (MYCOSTATIN /NYSTOP ) powder Apply 1 Application topically 2 (two) times daily. Patient not taking: Reported on 07/08/2024 12/11/23   Bulah Alm RAMAN, PA-C    Physical Exam: Vitals:   07/08/24 1654 07/08/24 1700 07/08/24 1705 07/08/24 1715  BP:  (!) 87/41 (!) 96/48 (!) 100/53  Pulse:  (!) 108 (!) 106 (!) 104  Resp:  (!) 22 19 13   Temp: (!) 102.7 F (39.3 C)     TempSrc: Oral     SpO2:  96% 93% 92%  Weight:       Height:       Physical Exam:  General: pale, obese HEENT: Normocephalic, atraumatic, PERRL Cardiovascular: Normal rate and rhythm. Distal pulses intact. Pulmonary: Normal pulmonary effort, normal breath sounds Gastrointestinal: Nondistended abdomen, soft, non-tender, normoactive bowel sounds, no organomegaly Musculoskeletal:Normal ROM, no lower ext edema Lymphadenopathy: No cervical LAD. Skin: Skin is hot and dry. Neuro: No focal deficits noted, AAOx3. PSYCH: Attentive and cooperative  Data Reviewed:  Results for orders placed or performed during the hospital encounter of 07/08/24 (from the past 24 hours)  Comprehensive metabolic panel     Status: Abnormal   Collection Time: 07/08/24 11:52 AM  Result Value Ref Range   Sodium 133 (L) 135 - 145 mmol/L   Potassium 4.5 3.5 - 5.1 mmol/L   Chloride 99 98 - 111 mmol/L   CO2 23 22 - 32 mmol/L   Glucose, Bld 152 (H) 70 - 99 mg/dL   BUN 18 6 - 20 mg/dL   Creatinine, Ser 8.82 0.61 - 1.24 mg/dL   Calcium  9.1 8.9 - 10.3 mg/dL   Total Protein 7.1 6.5 - 8.1 g/dL   Albumin 3.4 (L) 3.5 - 5.0 g/dL   AST 50 (H) 15 - 41 U/L   ALT 42 0 - 44 U/L   Alkaline Phosphatase 121 38 - 126 U/L   Total Bilirubin 1.4 (H) 0.0 - 1.2 mg/dL   GFR, Estimated >39 >39 mL/min   Anion gap 11 5 - 15  CBC with Differential     Status: Abnormal   Collection Time: 07/08/24 11:52 AM  Result Value Ref Range   WBC 22.2 (H) 4.0 - 10.5 K/uL   RBC 3.94 (L) 4.22 - 5.81 MIL/uL   Hemoglobin 12.1 (L) 13.0 - 17.0 g/dL   HCT 63.4 (L) 60.9 - 47.9 %   MCV 92.6 80.0 - 100.0 fL   MCH 30.7 26.0 - 34.0 pg   MCHC 33.2 30.0 - 36.0 g/dL   RDW 84.4 88.4 - 84.4 %   Platelets 143 (L) 150 - 400 K/uL   nRBC 0.0 0.0 - 0.2 %  Neutrophils Relative % 89 %   Neutro Abs 19.6 (H) 1.7 - 7.7 K/uL   Lymphocytes Relative 3 %   Lymphs Abs 0.6 (L) 0.7 - 4.0 K/uL   Monocytes Relative 7 %   Monocytes Absolute 1.6 (H) 0.1 - 1.0 K/uL   Eosinophils Relative 0 %   Eosinophils Absolute 0.1 0.0 -  0.5 K/uL   Basophils Relative 0 %   Basophils Absolute 0.1 0.0 - 0.1 K/uL   Immature Granulocytes 1 %   Abs Immature Granulocytes 0.22 (H) 0.00 - 0.07 K/uL  Culture, blood (Routine x 2)     Status: None (Preliminary result)   Collection Time: 07/08/24 11:55 AM   Specimen: BLOOD LEFT HAND  Result Value Ref Range   Specimen Description      BLOOD LEFT HAND Performed at [facility], [address], [place], [place] [address]    Special Requests      BOTTLES DRAWN AEROBIC AND ANAEROBIC Blood Culture adequate volume Performed at [facility], [address], [place], [place] [address]    Culture PENDING    Report Status PENDING   Resp panel by RT-PCR (RSV, Flu A&B, Covid) Anterior Nasal Swab     Status: None   Collection Time: 07/08/24  1:20 PM   Specimen: Anterior Nasal Swab  Result Value Ref Range   SARS Coronavirus 2 by RT PCR NEGATIVE NEGATIVE   Influenza A by PCR NEGATIVE NEGATIVE   Influenza B by PCR NEGATIVE NEGATIVE   Resp Syncytial Virus by PCR NEGATIVE NEGATIVE  Urinalysis, w/ Reflex to Culture (Infection Suspected) -Urine, Clean Catch     Status: Abnormal   Collection Time: 07/08/24  2:10 PM  Result Value Ref Range   Specimen Source URINE, CATHETERIZED    Color, Urine AMBER (A) YELLOW   APPearance TURBID (A) CLEAR   Specific Gravity, Urine 1.009 1.005 - 1.030   pH 6.0 5.0 - 8.0   Glucose, UA NEGATIVE NEGATIVE mg/dL   Hgb urine dipstick MODERATE (A) NEGATIVE   Bilirubin Urine NEGATIVE NEGATIVE   Ketones, ur NEGATIVE NEGATIVE mg/dL   Protein, ur 899 (A) NEGATIVE mg/dL   Nitrite POSITIVE (A) NEGATIVE   Leukocytes,Ua LARGE (A) NEGATIVE   RBC / HPF 21-50 0 - 5 RBC/hpf   WBC, UA >50 0 - 5 WBC/hpf   Bacteria, UA MANY (A) NONE SEEN   Squamous Epithelial / HPF 0-5 0 - 5 /HPF   WBC Clumps PRESENT    Mucus PRESENT    Hyaline Casts, UA PRESENT   I-Stat Lactic Acid, ED     Status: Abnormal   Collection Time: 07/08/24  2:44 PM  Result Value Ref  Range   Lactic Acid, Venous 3.0 (HH) 0.5 - 1.9 mmol/L   Comment NOTIFIED PHYSICIAN      Assessment and Plan: Sepsis with septic shock / uti  - Sepsis protocol and monitoring ordered - He received over 3 L of IV fluids - IV Maxipime  started - Follow lactic acid levels - His blood pressure has improved but if it drops again pressors may need to be considered. - Urology has been consulted by the emergency department provider but there is no hydronephrosis or obstruction or indication for urgent intervention at this time.  There is a stone still present.  It is unclear if that is the reason why this infection has come back.  2.  Obstructive sleep apnea - continue CPAP  3. Htn - Hold all antihypertensive due to  sepsis     Advance Care Planning:   Code Status: Full Code  CODE STATUS was deferred at the time of my initial evaluation because the patient had a high fever and his blood pressure was low and he was feeling poorly.  He is full code by default at this time however he was DNR 2 weeks ago when he was hospitalized so that will need to be readdressed.   Consults: urology  Family Communication: none  Severity of Illness: The appropriate patient status for this patient is INPATIENT. Inpatient status is judged to be reasonable and necessary in order to provide the required intensity of service to ensure the patient's safety. The patient's presenting symptoms, physical exam findings, and initial radiographic and laboratory data in the context of their chronic comorbidities is felt to place them at high risk for further clinical deterioration. Furthermore, it is not anticipated that the patient will be medically stable for discharge from the hospital within 2 midnights of admission.   * I certify that at the point of admission it is my clinical judgment that the patient will require inpatient hospital care spanning beyond 2 midnights from the point of admission due to high intensity of  service, high risk for further deterioration and high frequency of surveillance required.*  Author: ARTHEA CHILD, MD 07/08/2024 5:25 PM  For on call review [email or web address].      [1]  Allergies Allergen Reactions   Atorvastatin  Other (See Comments)    Myalgia    Pravastatin  Sodium Other (See Comments)    Myalgia   "

## 2024-07-08 NOTE — Progress Notes (Signed)
 "  Name: Patrick Baxter   Date of Visit: 07/08/2024   Date of last visit with me: 06/29/2024   CHIEF COMPLAINT:  Chief Complaint  Patient presents with   Fever    Hospital follow up, Yesterday around 2-3pm started with fever and generalized weakness. Chills and body aches. Fever broke around 4am. Also sees Dr. Braulio at [facility] his last A1c was 6.2 end of Oct and he was told he doesn't need to check sugars. Wonders if sugars that are too high or too low could be causing this?       HPI:  Discussed the use of AI scribe software for clinical note transcription with the patient, who gave verbal consent to proceed.  History of Present Illness  Admit date: 06/26/2024 Discharge date: 06/28/2024  Patrick Baxter is a 53 year old male who presents with fever, body aches, and chills following recent hospitalization for kidney stones.  He was hospitalized recently for kidney stones and had stents placed. He was discharged approximately ten days ago on June 28, 2024, and felt relatively well for about a week after discharge.  On July 07, 2024, he began experiencing fever, body aches, and chills, with a recorded fever of 102.50F at home. He also reports episodes of seeing 'stars' and blurry vision. He feels weak and achy but not short of breath.  Urinary symptoms have been improving but he still experiences urgency without output. Initially with hospitalization, he had severe pain in his kidneys radiating down.  His current medications include allopurinol  300 mg daily, albuterol  as needed, vitamin D  1000 IU daily, colchicine  as needed, Flexeril  as needed, Prozac  40 mg, Flonase nasal spray (not used recently), gabapentin 300 mg at bedtime, thyroid  medication 25 mcg, lisinopril  20 mg, metoprolol  XL 50 mg daily, nitroglycerin  as needed, nystatin  powder, fish oil, oxybutynin , Rybelsus , and Flomax . He has not used meclizine  recently.  No diarrhea, nausea, vomiting, runny nose, sneezing,  cough, earache, sore throat, new rashes, or unusual pain in legs or arms.   Discharge summary:   53 year old gentleman with history of recurrent UTIs, incomplete bladder emptying, type 2 diabetes, hypertension, sleep apnea on CPAP, hypothyroidism, obesity, anxiety and depression presented from home with fever and chills.  At the emergency room temperature 98.3, heart rate 108, blood pressure is stable.  On room air.  WBC count 13.9.  Creatinine 1.2.  COVID influenza and RSV negative.  Lactic acid 2.7-1.9.  UA was abnormal.  CT scan showed 5 mm calculus proximal right ureter with mild right-sided hydronephrosis.  Patient was resuscitated, given IV antibiotics and IV fluids and admitted to the hospital after undergoing cystoscopy and bilateral ureteric stent placement.   Infected right ureteric stone: Blood cultures negative.  Urine culture with E. coli.  Rocephin  x 2 days in the hospital. Status post bilateral ureteric stent placement.  Foley catheter removed. Urology to schedule outpatient follow-up.  Patient will complete 5 days of Keflex  that he already has at home.   Patient can go back on all his long-term medications, resume home medications on discharge.   Stable for discharge with outpatient follow-up.     Discharge Diagnoses:  Principal Problem:   Complicated UTI (urinary tract infection) Active Problems:   Hypothyroidism   Kidney stone   Sepsis (HCC)   AKI (acute kidney injury)  No other aggravating or relieving factors. No other complaint.    Objective: BP (!) 70/40 (BP Location: Right Arm, Cuff Size: Large)   Pulse (!) 104  Temp (!) 100.5 F (38.1 C) (Tympanic)   Ht 5' 8 (1.727 m)   Wt (!) 401 lb 6.4 oz (182.1 kg)   BMI 61.03 kg/m   BP Readings from Last 3 Encounters:  07/08/24 (!) 70/40  06/28/24 133/78  06/21/24 126/80   Wt Readings from Last 3 Encounters:  07/08/24 (!) 401 lb 6.4 oz (182.1 kg)  06/27/24 (!) 404 lb 1.7 oz (183.3 kg)  06/21/24 (!) 406 lb  12.8 oz (184.5 kg)   General appearence: alert, no distress, WD/WN, somewhat ill appearing, pale HEENT: normocephalic, sclerae anicteric, TMs pearly, nares patent, no discharge or erythema, pharynx normal Oral cavity: somewhat dry MM, no lesions Neck: supple, no lymphadenopathy, no thyromegaly, no masses, no JVD  Heart: RRR, normal S1, S2, no murmurs Lungs: CTA bilaterally, no wheezes, rhonchi, or rales Abdomen: +bs, soft, non tender, non distended, no masses, no hepatomegaly, no splenomegaly Pulses: 2+ symmetric, upper and lower extremities, normal cap refill Ext: no edema    Assessment: Encounter Diagnoses  Name Primary?   Fever, unspecified fever cause    Body aches    Chills    History of recent hospitalization    Dehydration Yes   Hypotension, unspecified hypotension type      Plan: Upon initial evaluation he was pale, lightheaded, and temperature 100.5.  Vital signs showed quite low blood pressure.  Unfortunately he did take his blood pressure medicines today and has been taking them since discharge from hospital 10 days ago  Flu and COVID-negative here today.  Recent blood cultures from hospital discharge were negative  Unfortunately he is quite hypotensive and dehydrated today.  I do not feel safe sending him home by personal vehicle.  He agreed to ambulance transport to hospital.  EMS called for transport for evaluation in the emergency department and IV fluids given his hypotension    Of note he has been dealing with recurrent urinary tract infection with recurrent symptoms for most of this year and has been seeing urology regularly for this as well along with multiple rounds of antibiotics.  He just had a hospitalization for kidney stone stent placement as it was felt that the stents were a nidus for infection.  He felt pretty good about about 10 days ago when he got out of the hospital, however felt pretty rough yesterday and today on presentation he is blood  pressure is quite low and he is lightheaded and appears dehydrated.  He did drive here today but he is not safe to drive home    Zakarie Sturdivant was seen today for fever.  Diagnoses and all orders for this visit:  Dehydration  Fever, unspecified fever cause -     Glucose (CBG), Fasting -     Influenza A/B -     POC COVID-19 BinaxNow  Body aches -     Glucose (CBG), Fasting -     Influenza A/B -     POC COVID-19 BinaxNow  Chills -     Glucose (CBG), Fasting -     Influenza A/B -     POC COVID-19 BinaxNow  History of recent hospitalization  Hypotension, unspecified hypotension type    F/u with ED visit for eval, IV hydration     "

## 2024-07-08 NOTE — ED Notes (Signed)
 Patient said his doctors office tested him for flu/covid. Both were negative.

## 2024-07-08 NOTE — Sepsis Progress Note (Signed)
 eLink is following this Code Sepsis.

## 2024-07-08 NOTE — Progress Notes (Signed)
 Arterial Catheter Insertion Procedure Note  Patrick Baxter  [id number]  [birth year]  Date:07/08/2024  Time:8:15 PM    Provider Performing: Fonda JAYSON Sharps    Procedure: Insertion of Arterial Line (63379) with US  guidance (23062)   Indication(s) Blood pressure monitoring and/or need for frequent ABGs  Consent Risks of the procedure as well as the alternatives and risks of each were explained to the patient and/or caregiver.  Consent for the procedure was obtained and is signed in the bedside chart  Anesthesia None   Time Out Verified patient identification, verified procedure, site/side was marked, verified correct patient position, special equipment/implants available, medications/allergies/relevant history reviewed, required imaging and test results available.   Sterile Technique Maximal sterile technique including full sterile barrier drape, hand hygiene, sterile gown, sterile gloves, mask, hair covering, sterile ultrasound probe cover (if used).   Procedure Description Area of catheter insertion was cleaned with chlorhexidine  and draped in sterile fashion. With real-time ultrasound guidance an arterial catheter was placed into the right radial artery.  Appropriate arterial tracings confirmed on monitor.     Complications/Tolerance None; patient tolerated the procedure well.   EBL Minimal   Specimen(s) None   Sherlean Sharps AGACNP-BC   [facility] 07/08/2024, 8:15 PM  Please see [email or web address] for pager details.  From 7A-7P if no response, please call [phone]. After hours, please call ELink [phone].

## 2024-07-08 NOTE — Consult Note (Signed)
 I reviewed the patient's CT and spoke with Dr. Randol. Patient's ureteral stents are in appropriate position, and the were only placed less than two weeks ago.  There is no hydronephrosis.  He needs no surgical intervention for his septoid features.  An official consult note to follow.

## 2024-07-08 NOTE — Progress Notes (Addendum)
" ° ° ° °  Patient Name: Patrick Baxter           DOB: [birth year]  MRN: [id number]      Admission Date: 07/08/2024  Attending Provider: Arthea Child, MD  Primary Diagnosis: Sepsis [facility])   Level of care: Stepdown   OVERNIGHT EVENT  Called to bedside by Rapid Response RN for pt in septic shock, SBP 60-70's.  Pt was admitted for urosepsis. Febrile and hypotensive on admission.  Pt has received ~ 3.2L bolus and 150 cc/h maintenance fluid. Broad abx already given. Last lactic 3.0 Patient being started on levophed , PCCM at bedside.   He remains A/O x4, afebrile, and is on RA without respiratory compromise. Last EF 55-60%.    Plan: Will add on another 1L fluid, continue levo gtt  PCCM has recommended solu-cortef   Repeat lactic acid Possible need for arterial and/ or central line for pressor needs  Attempted to call family for update x3. Prefers we call cousin, Cheron first as his father is 50 yo and frail. Will follow up on this.    Endy Easterly, DNP, ACNPC- AG [facility] [place]    "

## 2024-07-08 NOTE — H&P (Signed)
 "   NAME:  Sergei Delo, MRN:  [id number], DOB:  01-Jul-1971, LOS: 0 ADMISSION DATE:  07/08/2024,  History of Present Illness:  53 yo with history of History of OSA and CPAP, DM2, HTN, hypothyroidism, obesity.  He had a recent admission on 07/06/24-06/28/24 due to UTI and CT scan showed 5 mm calculus proximal R ureter with mild R hydronephrosis and he underwent to cytoscopy and bilateral ureteric stent placement. Blood cx were negative, urine culture with E.coli resistant to ampicillin, ciprofloxacin , bactrim and patient was discharge with keflex .  This time, patient was admitted fine when he started having fever, chills. On the ER his Wbc 22, LA  3.9, and hypotensive, he received fluid resuscitation and he was admitted to stepdown units. Nurses called us  for acute hypotension, he has received a total of 3L of fluid without significant response. Pressors were started.    Pertinent  Medical History   Past Medical History:  Diagnosis Date   Allergy    Anxiety    Back pain    Bilateral chronic knee pain    Chronic headaches    Depression    Dermatographism    Dry skin    Fatigue    Frequent urination    Gout    Hx of echocardiogram 09/07/2019   normal LV function, EF 60-65%, no wall motion abnormality; Dr. Michele Richardson   Hyperlipidemia    Hypertension    Joint pain    Knee pain    Obesity    Pre-diabetes    Sleep apnea    Stress    Wears glasses      Significant Hospital Events: Including procedures, antibiotic start and stop dates in addition to other pertinent events   12/26 - started on pressors  Interim History / Subjective:  Patient diaphoretic, on trendelenburg. Reported feeling ok. Pressors, stress dose steroids and 1L of fluid was started.   Objective    Blood pressure (!) 96/55, pulse (!) 105, temperature (!) 102.7 F (39.3 C), temperature source Oral, resp. rate 13, height 5' 8 (1.727 m), weight (!) 181.9 kg, SpO2 91%.       No intake or output data in the  24 hours ending 07/08/24 1938 Filed Weights   07/08/24 1151  Weight: (!) 181.9 kg    Examination: Physical Exam Constitutional:      Appearance: He is diaphoretic.  HENT:     Head: Normocephalic.  Eyes:     Extraocular Movements: Extraocular movements intact.     Pupils: Pupils are equal, round, and reactive to light.  Cardiovascular:     Rate and Rhythm: Tachycardia present.     Pulses: Normal pulses.     Heart sounds: Normal heart sounds.  Pulmonary:     Effort: Pulmonary effort is normal.     Breath sounds: Normal breath sounds.  Abdominal:     Comments: Denied abdominal pain.  Musculoskeletal:        General: Normal range of motion.  Neurological:     General: No focal deficit present.     Mental Status: He is alert and oriented to person, place, and time.      Resolved problem list   Assessment and Plan  Septic shock 2/2 to E.coli Urosepsis S/p bilateral ureteral sten placement due to kidney stone -CT scan showing bilateral J stents are in proper position. No hydronephrosis. -cefepime  IV -start levophed , Hydrocortisone  100 mg BID VI for septic shock -got 3l of fluids -urology following -monitor VS -follow up  labs -UA, uCX, blood CX.  Lactic acidosis AKI  Follow up labs Monitor urine output  OSA Continue CPAP   Best Practice (right click and Reselect all SmartList Selections daily)   Diet/type: full liquids  DVT prophylaxis LMWH Pressure ulcer(s): N/A GI prophylaxis: N/A Lines: N/A Foley:  N/A Code Status:  full code Last date of multidisciplinary goals of care discussion [today]  Labs   CBC: Recent Labs  Lab 07/08/24 1152  WBC 22.2*  NEUTROABS 19.6*  HGB 12.1*  HCT 36.5*  MCV 92.6  PLT 143*    Basic Metabolic Panel: Recent Labs  Lab 07/08/24 1152  NA 133*  K 4.5  CL 99  CO2 23  GLUCOSE 152*  BUN 18  CREATININE 1.17  CALCIUM  9.1   GFR: Estimated Creatinine Clearance: 114.8 mL/min (by C-G formula based on SCr of 1.17  mg/dL). Recent Labs  Lab 07/08/24 1152 07/08/24 1444 07/08/24 1843  WBC 22.2*  --   --   LATICACIDVEN  --  3.0* 2.7*    Liver Function Tests: Recent Labs  Lab 07/08/24 1152  AST 50*  ALT 42  ALKPHOS 121  BILITOT 1.4*  PROT 7.1  ALBUMIN 3.4*   No results for input(s): LIPASE, AMYLASE in the last 168 hours. No results for input(s): AMMONIA in the last 168 hours.  ABG    Component Value Date/Time   PHART 7.434 01/02/2024 1545   PCO2ART 31.7 (L) 01/02/2024 1545   PO2ART 71 (L) 01/02/2024 1545   HCO3 22.1 01/02/2024 1705   TCO2 23 01/02/2024 1705   ACIDBASEDEF 2.0 01/02/2024 1705   O2SAT 92 01/02/2024 1705     Coagulation Profile: Recent Labs  Lab 07/08/24 1843  INR 1.4*    Cardiac Enzymes: No results for input(s): CKTOTAL, CKMB, CKMBINDEX, TROPONINI in the last 168 hours.  HbA1C: Hgb A1c MFr Bld  Date/Time Value Ref Range Status  01/02/2024 07:59 PM 6.3 (H) 4.8 - 5.6 % Final    Comment:    (NOTE) Diagnosis of Diabetes The following HbA1c ranges recommended by the American Diabetes Association (ADA) may be used as an aid in the diagnosis of diabetes mellitus.  Hemoglobin             Suggested A1C NGSP%              Diagnosis  <5.7                   Non Diabetic  5.7-6.4                Pre-Diabetic  >6.4                   Diabetic  <7.0                   Glycemic control for                       adults with diabetes.    12/01/2022 10:54 AM 6.4 (H) 4.8 - 5.6 % Final    Comment:             Prediabetes: 5.7 - 6.4          Diabetes: >6.4          Glycemic control for adults with diabetes: <7.0     CBG: No results for input(s): GLUCAP in the last 168 hours.  Review of Systems:   As above  Past Medical History:  He,  has a past medical history of Allergy, Anxiety, Back pain, Bilateral chronic knee pain, Chronic headaches, Depression, Dermatographism, Dry skin, Fatigue, Frequent urination, Gout, echocardiogram (09/07/2019),  Hyperlipidemia, Hypertension, Joint pain, Knee pain, Obesity, Pre-diabetes, Sleep apnea, Stress, and Wears glasses.   Surgical History:   Past Surgical History:  Procedure Laterality Date   CYSTOSCOPY WITH RETROGRADE PYELOGRAM, URETEROSCOPY AND STENT PLACEMENT Bilateral 06/26/2024   Procedure: CYSTOURETEROSCOPY, WITH RETROGRADE PYELOGRAM AND STENT INSERTION;  Surgeon: Francisca Redell BROCKS, MD;  Location: [facility];  Service: Urology;  Laterality: Bilateral;   IR INJECT/THERA/INC NEEDLE/CATH/PLC EPI/LUMB/SAC W/IMG  09/14/2020   IR INJECT/THERA/INC NEEDLE/CATH/PLC EPI/LUMB/SAC W/IMG  09/14/2020   KNEE SURGERY  11/2006   right knee arthroscopy and meniscal repair, Dr. Rendell     Social History:   reports that he has quit smoking. His smoking use included cigarettes. He has never used smokeless tobacco. He reports that he does not currently use alcohol after a past usage of about 3.0 standard drinks of alcohol per week. He reports that he does not use drugs.   Family History:  His family history includes Alzheimer's disease in his mother; Arthritis in his father; Cancer in his maternal grandmother and mother; Diabetes in his paternal grandmother; Heart disease in his father and mother; Hyperlipidemia in his mother; Hypertension in his mother; Stroke in his mother.   Allergies Allergies[1]   Home Medications  Prior to Admission medications  Medication Sig Start Date End Date Taking? Authorizing Provider  acetaminophen  (TYLENOL ) 500 MG tablet Take 1,000 mg by mouth every 6 (six) hours as needed for mild pain (pain score 1-3) or fever.   Yes [provider]  ALEVE  220 MG tablet Take 220-440 mg by mouth 2 (two) times daily as needed (for mild pain).   Yes [provider]  allopurinol  (ZYLOPRIM ) 300 MG tablet TAKE 1 TABLET(300 MG) BY MOUTH DAILY Patient taking differently: Take 300 mg by mouth in the morning. 03/09/24  Yes Tysinger, Alm RAMAN, PA-C  cetirizine (ZYRTEC) 10 MG tablet Take 10 mg by  mouth daily as needed for allergies or rhinitis.   Yes [provider]  cholecalciferol (VITAMIN D3) 25 MCG (1000 UNIT) tablet Take 1 tablet (1,000 Units total) by mouth daily. 12/01/22  Yes Tysinger, Alm RAMAN, PA-C  colchicine  0.6 MG tablet Take 1 tablet (0.6 mg total) by mouth daily as needed (for gout). Patient taking differently: Take 0.6 mg by mouth daily as needed (as directed for gout flares). 01/07/24  Yes Cheryle Page, MD  cyclobenzaprine  (FLEXERIL ) 10 MG tablet Take 1 tablet (10 mg total) by mouth 3 (three) times daily as needed for muscle spasms. 01/01/24  Yes Early, Sara E, NP  FLUoxetine  (PROZAC ) 40 MG capsule TAKE 1 CAPSULE(40 MG) BY MOUTH DAILY Patient taking differently: Take 40 mg by mouth in the morning. 06/29/24  Yes Tysinger, Alm RAMAN, PA-C  gabapentin (NEURONTIN) 300 MG capsule Take 300 mg by mouth at bedtime.   Yes [provider]  levothyroxine  (SYNTHROID ) 25 MCG tablet TAKE 1 TABLET BY MOUTH EVERY DAY BEFORE BREAKFAST Patient taking differently: Take 25 mcg by mouth daily before breakfast. 12/14/23  Yes Tysinger, Alm RAMAN, PA-C  lisinopril  (ZESTRIL ) 20 MG tablet TAKE 1 TABLET(20 MG) BY MOUTH DAILY 03/15/24  Yes Tysinger, Alm RAMAN, PA-C  metoprolol  succinate (TOPROL -XL) 50 MG 24 hr tablet Take 1 tablet (50 mg total) by mouth daily. 07/04/24  Yes Tysinger, Alm RAMAN, PA-C  montelukast  (SINGULAIR ) 10 MG tablet TAKE 1 TABLET(10 MG) BY MOUTH  AT BEDTIME 01/04/24  Yes Tysinger, Alm RAMAN, PA-C  nitroGLYCERIN  (NITROSTAT ) 0.4 MG SL tablet Place 1 tablet (0.4 mg total) under the tongue every 5 (five) minutes as needed for chest pain. 08/30/19 07/08/24 Yes Tolia, Sunit, DO  oxybutynin  (DITROPAN -XL) 10 MG 24 hr tablet Take 1 tablet (10 mg total) by mouth daily. 06/29/24  Yes Francisca Redell BROCKS, MD  RYBELSUS  14 MG TABS Take 14 mg by mouth in the morning.   Yes [provider]  sodium chloride  (OCEAN) 0.65 % SOLN nasal spray Place 1 spray into both nostrils as needed for  congestion.   Yes [provider]  tamsulosin  (FLOMAX ) 0.4 MG CAPS capsule Take 0.4 mg by mouth daily.   Yes [provider]  albuterol  (VENTOLIN  HFA) 108 (90 Base) MCG/ACT inhaler Inhale 2 puffs into the lungs every 6 (six) hours as needed for wheezing or shortness of breath. Patient not taking: Reported on 07/08/2024 12/01/22   Tysinger, Alm RAMAN, PA-C  clotrimazole -betamethasone  (LOTRISONE ) cream Apply 1 Application topically daily. 12/11/23   Tysinger, Alm RAMAN, PA-C  fenofibrate  (TRICOR ) 145 MG tablet Take 1 tablet (145 mg total) by mouth daily. Patient not taking: Reported on 07/08/2024 05/13/24 05/13/25  Tysinger, Alm RAMAN, PA-C  meclizine  (ANTIVERT ) 25 MG tablet Take 1 tablet (25 mg total) by mouth 2 (two) times daily as needed for dizziness. Patient not taking: Reported on 07/08/2024 01/07/24   Cheryle Page, MD  Multiple Vitamins-Minerals (MULTIVITAMIN WITH MINERALS) tablet Take 1 tablet by mouth daily. Patient not taking: Reported on 07/08/2024 12/01/22   Tysinger, Alm RAMAN, PA-C  mupirocin  ointment (BACTROBAN ) 2 % Place 1 Application into the nose 3 (three) times daily. Patient not taking: Reported on 07/08/2024 02/01/24   Tysinger, Alm RAMAN, PA-C  nystatin  (MYCOSTATIN /NYSTOP ) powder Apply 1 Application topically 2 (two) times daily. Patient not taking: Reported on 07/08/2024 12/11/23   Tysinger, Alm RAMAN, PA-C       The patient is critically ill due to septic shock urosepsis.  Critical care was necessary to treat or prevent imminent or life-threatening deterioration. Critical care time was spent by me on the following activities: development of a treatment plan with the patient and/or surrogate as well as nursing, discussions with consultants, evaluation of the patient's response to treatment, examination of the patient, obtaining a history from the patient or surrogate, ordering and performing treatments and interventions, ordering and review of laboratory studies, ordering  and review of radiographic studies, review of telemetry data including pulse oximetry, re-evaluation of patient's condition and participation in multidisciplinary rounds.   I personally spent 60 minutes providing critical care not including any separately billable procedures.  Marny Patch, MD  Pulmonary Critical Care 07/08/2024 7:38 PM                    [1]  Allergies Allergen Reactions   Atorvastatin  Other (See Comments)    Myalgia    Pravastatin  Sodium Other (See Comments)    Myalgia   "

## 2024-07-08 NOTE — ED Triage Notes (Signed)
 Patient BIB [facility] from a doctors office for a follow up. Recently admitted for UTI/sepsis/kidney stones. Was vomiting today, chills and body aches. At the office was 68/40, temperature 100.21F, 110 HR.   EMS 150LR  18G left AC

## 2024-07-08 NOTE — ED Notes (Signed)
 Initial lactic 3.08. Attempted to inform a doc of the critical value, but she told me the pt belonged to another doctor who was not in the room at the time. Charge nurse made aware of this.

## 2024-07-09 DIAGNOSIS — Z960 Presence of urogenital implants: Secondary | ICD-10-CM | POA: Diagnosis not present

## 2024-07-09 DIAGNOSIS — N200 Calculus of kidney: Secondary | ICD-10-CM | POA: Diagnosis not present

## 2024-07-09 DIAGNOSIS — Z9989 Dependence on other enabling machines and devices: Secondary | ICD-10-CM | POA: Diagnosis not present

## 2024-07-09 DIAGNOSIS — A4151 Sepsis due to Escherichia coli [E. coli]: Secondary | ICD-10-CM | POA: Diagnosis not present

## 2024-07-09 DIAGNOSIS — G4733 Obstructive sleep apnea (adult) (pediatric): Secondary | ICD-10-CM | POA: Diagnosis not present

## 2024-07-09 DIAGNOSIS — R6521 Severe sepsis with septic shock: Secondary | ICD-10-CM | POA: Diagnosis not present

## 2024-07-09 LAB — BASIC METABOLIC PANEL WITH GFR
Anion gap: 9 (ref 5–15)
BUN: 16 mg/dL (ref 6–20)
CO2: 24 mmol/L (ref 22–32)
Calcium: 9.3 mg/dL (ref 8.9–10.3)
Chloride: 106 mmol/L (ref 98–111)
Creatinine, Ser: 1.02 mg/dL (ref 0.61–1.24)
GFR, Estimated: >60 mL/min
Glucose, Bld: 163 mg/dL — ABNORMAL HIGH (ref 70–99)
Potassium: 4.1 mmol/L (ref 3.5–5.1)
Sodium: 139 mmol/L (ref 135–145)

## 2024-07-09 LAB — CBC
HCT: 35.3 % — ABNORMAL LOW (ref 39.0–52.0)
Hemoglobin: 11.4 g/dL — ABNORMAL LOW (ref 13.0–17.0)
MCH: 29.9 pg (ref 26.0–34.0)
MCHC: 32.3 g/dL (ref 30.0–36.0)
MCV: 92.7 fL (ref 80.0–100.0)
Platelets: 111 K/uL — ABNORMAL LOW (ref 150–400)
RBC: 3.81 MIL/uL — ABNORMAL LOW (ref 4.22–5.81)
RDW: 15.3 % (ref 11.5–15.5)
WBC: 13.7 K/uL — ABNORMAL HIGH (ref 4.0–10.5)
nRBC: 0 % (ref 0.0–0.2)

## 2024-07-09 LAB — MAGNESIUM: Magnesium: 2.1 mg/dL (ref 1.7–2.4)

## 2024-07-09 LAB — LACTIC ACID, PLASMA: Lactic Acid, Venous: 1.2 mmol/L (ref 0.5–1.9)

## 2024-07-09 MED ORDER — MELATONIN 3 MG PO TABS
3.0000 mg | ORAL_TABLET | Freq: Every day | ORAL | Status: DC
  Administered 2024-07-09 – 2024-07-10 (×2): 3 mg via ORAL
  Filled 2024-07-09 (×2): qty 1

## 2024-07-09 MED ORDER — ENSURE PLUS HIGH PROTEIN PO LIQD
237.0000 mL | Freq: Two times a day (BID) | ORAL | Status: DC
  Administered 2024-07-09 – 2024-07-11 (×4): 237 mL via ORAL

## 2024-07-09 NOTE — Consult Note (Signed)
 "   I have been asked to see the patient by Dr. Thom Fetters, for evaluation and management of pyelonephritis.  History of present illness: 53 year old male with a history of recurrent urinary tract infections and bilateral obstructing stones, who received bilateral ureteral stents 2 weeks prior, presented to the emergency department with low blood pressure, fever, and malaise.  Initially he went to see his primary care doctor, he subsequently was referred to the emergency department because of his low blood pressure and concern for sepsis.  In the emergency department the patient did have a CT scan which demonstrated bilateral ureteral stents in appropriate position with no hydronephrosis.  His urine analysis was concerning for infection and his appearance overall was concerning for sepsis.  He was taken to the ICU from the emergency department for resuscitation.  Review of systems: A 12 point comprehensive review of systems was obtained and is negative unless otherwise stated in the history of present illness.  Patient Active Problem List   Diagnosis Date Noted   Septic shock (HCC) 07/08/2024   Sepsis (HCC) 06/27/2024   AKI (acute kidney injury) 06/27/2024   Kidney stone 06/26/2024   Complicated UTI (urinary tract infection) 05/13/2024   Aortic atherosclerosis 05/13/2024   Edema 03/02/2024   Morbid obesity (HCC) 03/02/2024   Fatigue 01/18/2024   Multifocal pneumonia 01/03/2024   Acute respiratory failure with hypoxia (HCC) 01/02/2024   Statin-induced myositis 12/11/2023   Tinea cruris 12/11/2023   Dysuria 12/11/2023   Chronic gout without tophus 12/11/2023   Screening for hematuria or proteinuria 12/11/2023   Medication management 12/01/2022   Influenza-like illness 07/16/2022   Neck discomfort 04/01/2022   BMI 60.0-69.9, adult (HCC) 05/13/2021   Type 2 diabetes mellitus with hyperglycemia, without long-term current use of insulin  (HCC) 04/23/2021   Caregiver burden 04/23/2021   Colon  cancer screening 10/22/2020   Screening for prostate cancer 10/22/2020   Hypothyroidism 09/05/2020   B12 deficiency 11/17/2017   Vitamin D  deficiency 11/17/2017   Knee pain, bilateral 08/27/2017   OSA (obstructive sleep apnea) 08/27/2017   Vaccine counseling 10/28/2016   Closed compression fracture of thoracic vertebra (HCC) 08/12/2016   Spondylolisthesis of lumbar region 08/12/2016   DDD (degenerative disc disease), lumbar 08/12/2016   Bilateral carpal tunnel syndrome 06/24/2016   Acute bilateral low back pain with bilateral sciatica 05/05/2016   Chronic bilateral low back pain without sciatica 05/05/2016   Encounter for health maintenance examination in adult 09/20/2015   Screen for STD (sexually transmitted disease) 09/20/2015   Rhinitis, allergic 09/20/2015   Family history of premature CAD 09/20/2015   Anxiety and depression 04/20/2014   Essential hypertension 04/20/2014   Primary gout 04/20/2014    Medications Ordered Prior to Encounter[1]  Past Medical History:  Diagnosis Date   Allergy    Anxiety    Back pain    Bilateral chronic knee pain    Chronic headaches    Depression    Dermatographism    Dry skin    Fatigue    Frequent urination    Gout    Hx of echocardiogram 09/07/2019   normal LV function, EF 60-65%, no wall motion abnormality; Dr. Michele Richardson   Hyperlipidemia    Hypertension    Joint pain    Knee pain    Obesity    Pre-diabetes    Sleep apnea    Stress    Wears glasses     Past Surgical History:  Procedure Laterality Date   CYSTOSCOPY WITH RETROGRADE  PYELOGRAM, URETEROSCOPY AND STENT PLACEMENT Bilateral 06/26/2024   Procedure: CYSTOURETEROSCOPY, WITH RETROGRADE PYELOGRAM AND STENT INSERTION;  Surgeon: Francisca Redell BROCKS, MD;  Location: [facility];  Service: Urology;  Laterality: Bilateral;   IR INJECT/THERA/INC NEEDLE/CATH/PLC EPI/LUMB/SAC W/IMG  09/14/2020   IR INJECT/THERA/INC NEEDLE/CATH/PLC EPI/LUMB/SAC W/IMG  09/14/2020   KNEE SURGERY  11/2006    right knee arthroscopy and meniscal repair, Dr. Rendell    Social History[2]  Family History  Problem Relation Age of Onset   Alzheimer's disease Mother    Heart disease Mother        86, angioplasty, CAD   Cancer Mother        breast   Stroke Mother    Hypertension Mother    Hyperlipidemia Mother    Heart disease Father        angina   Arthritis Father    Cancer Maternal Grandmother        leukemia   Diabetes Paternal Grandmother     PE: Vitals:   07/09/24 0545 07/09/24 0600 07/09/24 0615 07/09/24 0630  BP:      Pulse: 69 71 68 72  Resp: 12 19 12 13   Temp:      TempSrc:      SpO2: 99% 98% 99% 100%  Weight:      Height:       Patient appears to be in no acute distress  patient is alert and oriented x3 Atraumatic normocephalic head No cervical or supraclavicular lymphadenopathy appreciated No increased work of breathing, no audible wheezes/rhonchi Regular sinus rhythm/rate Abdomen is soft, nontender, nondistended Lower extremities are symmetric without appreciable edema Grossly neurologically intact No identifiable skin lesions  Recent Labs    07/08/24 1152 07/09/24 0511  WBC 22.2* 13.7*  HGB 12.1* 11.4*  HCT 36.5* 35.3*   Recent Labs    07/08/24 1152 07/09/24 0511  NA 133* 139  K 4.5 4.1  CL 99 106  CO2 23 24  GLUCOSE 152* 163*  BUN 18 16  CREATININE 1.17 1.02  CALCIUM  9.1 9.3   Recent Labs    07/08/24 1843  INR 1.4*   No results for input(s): LABURIN in the last 72 hours. Results for orders placed or performed during the hospital encounter of 07/08/24  Culture, blood (Routine x 2)     Status: None (Preliminary result)   Collection Time: 07/08/24 11:55 AM   Specimen: BLOOD LEFT HAND  Result Value Ref Range Status   Specimen Description   Final    BLOOD LEFT HAND Performed at [facility], [address], [place], [place] [address]    Special Requests   Final    BOTTLES DRAWN AEROBIC AND ANAEROBIC Blood Culture adequate  volume Performed at [facility], [address], [place], [place] [address]    Culture PENDING  Incomplete   Report Status PENDING  Incomplete  Resp panel by RT-PCR (RSV, Flu A&B, Covid) Anterior Nasal Swab     Status: None   Collection Time: 07/08/24  1:20 PM   Specimen: Anterior Nasal Swab  Result Value Ref Range Status   SARS Coronavirus 2 by RT PCR NEGATIVE NEGATIVE Final    Comment: (NOTE) SARS-CoV-2 target nucleic acids are NOT DETECTED.  The SARS-CoV-2 RNA is generally detectable in upper respiratory specimens during the acute phase of infection. The lowest concentration of SARS-CoV-2 viral copies this assay can detect is 138 copies/mL. A negative result does not preclude SARS-Cov-2 infection and should not be used as  the sole basis for treatment or other patient management decisions. A negative result may occur with  improper specimen collection/handling, submission of specimen other than nasopharyngeal swab, presence of viral mutation(s) within the areas targeted by this assay, and inadequate number of viral copies(<138 copies/mL). A negative result must be combined with clinical observations, patient history, and epidemiological information. The expected result is Negative.  Fact Sheet for Patients:  bloggercourse.com  Fact Sheet for Healthcare Providers:  seriousbroker.it  This test is no t yet approved or cleared by the [place]  FDA and  has been authorized for detection and/or diagnosis of SARS-CoV-2 by FDA under an Emergency Use Authorization (EUA). This EUA will remain  in effect (meaning this test can be used) for the duration of the COVID-19 declaration under Section 564(b)(1) of the Act, 21 U.S.C.section 360bbb-3(b)(1), unless the authorization is terminated  or revoked sooner.       Influenza A by PCR NEGATIVE NEGATIVE Final   Influenza B by PCR NEGATIVE NEGATIVE Final    Comment:  (NOTE) The Xpert Xpress SARS-CoV-2/FLU/RSV plus assay is intended as an aid in the diagnosis of influenza from Nasopharyngeal swab specimens and should not be used as a sole basis for treatment. Nasal washings and aspirates are unacceptable for Xpert Xpress SARS-CoV-2/FLU/RSV testing.  Fact Sheet for Patients: bloggercourse.com  Fact Sheet for Healthcare Providers: seriousbroker.it  This test is not yet approved or cleared by the [place]  FDA and has been authorized for detection and/or diagnosis of SARS-CoV-2 by FDA under an Emergency Use Authorization (EUA). This EUA will remain in effect (meaning this test can be used) for the duration of the COVID-19 declaration under Section 564(b)(1) of the Act, 21 U.S.C. section 360bbb-3(b)(1), unless the authorization is terminated or revoked.     Resp Syncytial Virus by PCR NEGATIVE NEGATIVE Final    Comment: (NOTE) Fact Sheet for Patients: bloggercourse.com  Fact Sheet for Healthcare Providers: seriousbroker.it  This test is not yet approved or cleared by the [place]  FDA and has been authorized for detection and/or diagnosis of SARS-CoV-2 by FDA under an Emergency Use Authorization (EUA). This EUA will remain in effect (meaning this test can be used) for the duration of the COVID-19 declaration under Section 564(b)(1) of the Act, 21 U.S.C. section 360bbb-3(b)(1), unless the authorization is terminated or revoked.  Performed at [facility], [address], [place], [place] [address]     Imaging: I reviewed the patient's imaging from the emergency department including a CT scan.  This demonstrated stents in the ureters in appropriate position with no hydronephrosis.  I also reviewed the images from December 14 which demonstrate 2 small bilateral ureteral stones, which were subsequently stented.  Imp:The  patient appears to have developed a recurrent urinary tract infection and subsequent urosepsis.  Fortunately, his stents are in appropriate position and he has no evidence of hydronephrosis.  His renal function is normal.  Recommendations: At this point the patient does not need a stent exchange or manipulated.  Instead, recommend aggressive resuscitation and IV antibiotics.  He is scheduled to have his stones removed on January 19 with Dr. Francisca.  Hopefully he will recover adequately so that we can keep this surgery date.  If it needs to be adjusted we can do that closer to the time of his procedure.  I will alert Dr. Francisca to his admission.    Upon discharge and after the patient has completed 14 days of culture specific antibiotics he should be  given a suppression abx for 30 days or so, to allow him to get through the scheduled surgery with Dr. Lorence.  Previous cultures (12/14) show that nitrofurantoin  50mg  at bedtime would be appropriate - recommend verifying that current bacteria is sensitive to that also prior to prescribing it.  Otherwise, there is no additional treatment that the patient would need from my perspective.  We will sign off on this patient, please consult me again if you have any additional questions or concerns. Morene LELON Salines        [1]  No current facility-administered medications on file prior to encounter.   Current Outpatient Medications on File Prior to Encounter  Medication Sig Dispense Refill   acetaminophen  (TYLENOL ) 500 MG tablet Take 1,000 mg by mouth every 6 (six) hours as needed for mild pain (pain score 1-3) or fever.     ALEVE  220 MG tablet Take 220-440 mg by mouth 2 (two) times daily as needed (for mild pain).     allopurinol  (ZYLOPRIM ) 300 MG tablet TAKE 1 TABLET(300 MG) BY MOUTH DAILY (Patient taking differently: Take 300 mg by mouth in the morning.) 90 tablet 1   cetirizine (ZYRTEC) 10 MG tablet Take 10 mg by mouth daily as needed for allergies  or rhinitis.     cholecalciferol (VITAMIN D3) 25 MCG (1000 UNIT) tablet Take 1 tablet (1,000 Units total) by mouth daily. 90 tablet 3   colchicine  0.6 MG tablet Take 1 tablet (0.6 mg total) by mouth daily as needed (for gout). (Patient taking differently: Take 0.6 mg by mouth daily as needed (as directed for gout flares).)     cyclobenzaprine  (FLEXERIL ) 10 MG tablet Take 1 tablet (10 mg total) by mouth 3 (three) times daily as needed for muscle spasms. 30 tablet 3   FLUoxetine  (PROZAC ) 40 MG capsule TAKE 1 CAPSULE(40 MG) BY MOUTH DAILY (Patient taking differently: Take 40 mg by mouth in the morning.) 90 capsule 1   gabapentin (NEURONTIN) 300 MG capsule Take 300 mg by mouth at bedtime.     levothyroxine  (SYNTHROID ) 25 MCG tablet TAKE 1 TABLET BY MOUTH EVERY DAY BEFORE BREAKFAST (Patient taking differently: Take 25 mcg by mouth daily before breakfast.) 90 tablet 3   lisinopril  (ZESTRIL ) 20 MG tablet TAKE 1 TABLET(20 MG) BY MOUTH DAILY 90 tablet 1   metoprolol  succinate (TOPROL -XL) 50 MG 24 hr tablet Take 1 tablet (50 mg total) by mouth daily. 90 tablet 1   montelukast  (SINGULAIR ) 10 MG tablet TAKE 1 TABLET(10 MG) BY MOUTH AT BEDTIME 90 tablet 3   nitroGLYCERIN  (NITROSTAT ) 0.4 MG SL tablet Place 1 tablet (0.4 mg total) under the tongue every 5 (five) minutes as needed for chest pain. 30 tablet 1   oxybutynin  (DITROPAN -XL) 10 MG 24 hr tablet Take 1 tablet (10 mg total) by mouth daily. 30 tablet 0   RYBELSUS  14 MG TABS Take 14 mg by mouth in the morning.     sodium chloride  (OCEAN) 0.65 % SOLN nasal spray Place 1 spray into both nostrils as needed for congestion.     tamsulosin  (FLOMAX ) 0.4 MG CAPS capsule Take 0.4 mg by mouth daily.     albuterol  (VENTOLIN  HFA) 108 (90 Base) MCG/ACT inhaler Inhale 2 puffs into the lungs every 6 (six) hours as needed for wheezing or shortness of breath. (Patient not taking: Reported on 07/08/2024) 8 g 2   clotrimazole -betamethasone  (LOTRISONE ) cream Apply 1 Application  topically daily. 30 g 0   fenofibrate  (TRICOR ) 145 MG tablet Take  1 tablet (145 mg total) by mouth daily. (Patient not taking: Reported on 07/08/2024) 90 tablet 0   meclizine  (ANTIVERT ) 25 MG tablet Take 1 tablet (25 mg total) by mouth 2 (two) times daily as needed for dizziness. (Patient not taking: Reported on 07/08/2024)     Multiple Vitamins-Minerals (MULTIVITAMIN WITH MINERALS) tablet Take 1 tablet by mouth daily. (Patient not taking: Reported on 07/08/2024) 90 tablet 3   mupirocin  ointment (BACTROBAN ) 2 % Place 1 Application into the nose 3 (three) times daily. (Patient not taking: Reported on 07/08/2024) 22 g 0   nystatin  (MYCOSTATIN /NYSTOP ) powder Apply 1 Application topically 2 (two) times daily. (Patient not taking: Reported on 07/08/2024) 30 g 1  [2]  Social History Tobacco Use   Smoking status: Former    Types: Cigarettes   Smokeless tobacco: Never   Tobacco comments:    socially prior, not daily smoker  Vaping Use   Vaping status: Never Used  Substance Use Topics   Alcohol use: Not Currently    Alcohol/week: 3.0 standard drinks of alcohol    Types: 3 Cans of beer per week   Drug use: No   "

## 2024-07-09 NOTE — Progress Notes (Signed)
 "   NAME:  Patrick Baxter, MRN:  [id number], DOB:  Sep 03, 1970, LOS: 1 ADMISSION DATE:  07/08/2024,  History of Present Illness:  53 yo with history of History of OSA and CPAP, DM2, HTN, hypothyroidism, obesity.  He had a recent admission on 07/06/24-06/28/24 due to UTI and CT scan showed 5 mm calculus proximal R ureter with mild R hydronephrosis and he underwent to cytoscopy and bilateral ureteric stent placement. Blood cx were negative, urine culture with E.coli resistant to ampicillin, ciprofloxacin , bactrim and patient was discharge with keflex .  This time, patient was admitted fine when he started having fever, chills. On the ER his Wbc 22, LA  3.9, and hypotensive, he received fluid resuscitation and he was admitted to stepdown units. Nurses called us  for acute hypotension, he has received a total of 3L of fluid without significant response. Pressors were started.    Pertinent  Medical History   Past Medical History:  Diagnosis Date   Allergy    Anxiety    Back pain    Bilateral chronic knee pain    Chronic headaches    Depression    Dermatographism    Dry skin    Fatigue    Frequent urination    Gout    Hx of echocardiogram 09/07/2019   normal LV function, EF 60-65%, no wall motion abnormality; Dr. Michele Richardson   Hyperlipidemia    Hypertension    Joint pain    Knee pain    Obesity    Pre-diabetes    Sleep apnea    Stress    Wears glasses      Significant Hospital Events: Including procedures, antibiotic start and stop dates in addition to other pertinent events   12/26 - started on pressors and stress dose steroids  Interim History / Subjective:    Objective    Blood pressure (!) 135/53, pulse 75, temperature 97.7 F (36.5 C), temperature source Oral, resp. rate 13, height 5' 8 (1.727 m), weight (!) 182.4 kg, SpO2 100%.        Intake/Output Summary (Last 24 hours) at 07/09/2024 0828 Last data filed at 07/09/2024 9367 Gross per 24 hour  Intake 3076.78 ml   Output 2625 ml  Net 451.78 ml   Filed Weights   07/08/24 1151 07/08/24 1820 07/09/24 0500  Weight: (!) 181.9 kg (!) 183.7 kg (!) 182.4 kg    Examination: Physical Exam Constitutional:      Appearance: He is diaphoretic.  HENT:     Head: Normocephalic.  Eyes:     Extraocular Movements: Extraocular movements intact.     Pupils: Pupils are equal, round, and reactive to light.  Cardiovascular:     Rate and Rhythm: Tachycardia present.     Pulses: Normal pulses.     Heart sounds: Normal heart sounds.  Pulmonary:     Effort: Pulmonary effort is normal.     Breath sounds: Normal breath sounds.  Abdominal:     Comments: Denied abdominal pain.  Musculoskeletal:        General: Normal range of motion.  Neurological:     General: No focal deficit present.     Mental Status: He is alert and oriented to person, place, and time.      Resolved problem list  Lactic Acidosis  Assessment and Plan  Septic shock 2/2 to E.coli Urosepsis S/p bilateral ureteral sten placement due to kidney stone -CT scan showing bilateral J stents are in proper position. No hydronephrosis. - continue cefepime  IV -  follow up cultures and descalate antibiotics as appropriate - weaned off levophed  this AM - Will stop IV hydrocortisone  as he is off vasopressor support, last dose this AM -urology following  OSA Continue CPAP  Disposition: if remains off vasopressors, will transfer out of ICU this afternoon.   Best Practice (right click and Reselect all SmartList Selections daily)   Diet/type: full liquids  DVT prophylaxis LMWH Pressure ulcer(s): N/A GI prophylaxis: N/A Lines: N/A Foley:  N/A Code Status:  full code Last date of multidisciplinary goals of care discussion [patient updated today at bedside]  Labs   CBC: Recent Labs  Lab 07/08/24 1152 07/09/24 0511  WBC 22.2* 13.7*  NEUTROABS 19.6*  --   HGB 12.1* 11.4*  HCT 36.5* 35.3*  MCV 92.6 92.7  PLT 143* 111*    Basic  Metabolic Panel: Recent Labs  Lab 07/08/24 1152 07/09/24 0511  NA 133* 139  K 4.5 4.1  CL 99 106  CO2 23 24  GLUCOSE 152* 163*  BUN 18 16  CREATININE 1.17 1.02  CALCIUM  9.1 9.3  MG  --  2.1   GFR: Estimated Creatinine Clearance: 131.9 mL/min (by C-G formula based on SCr of 1.02 mg/dL). Recent Labs  Lab 07/08/24 1152 07/08/24 1444 07/08/24 1843 07/09/24 0511  WBC 22.2*  --   --  13.7*  LATICACIDVEN  --  3.0* 2.7* 1.2    Liver Function Tests: Recent Labs  Lab 07/08/24 1152  AST 50*  ALT 42  ALKPHOS 121  BILITOT 1.4*  PROT 7.1  ALBUMIN 3.4*   No results for input(s): LIPASE, AMYLASE in the last 168 hours. No results for input(s): AMMONIA in the last 168 hours.  ABG    Component Value Date/Time   PHART 7.46 (H) 07/08/2024 2044   PCO2ART 36 07/08/2024 2044   PO2ART 77 (L) 07/08/2024 2044   HCO3 25.6 07/08/2024 2044   TCO2 23 01/02/2024 1705   ACIDBASEDEF 2.0 01/02/2024 1705   O2SAT 99.3 07/08/2024 2044     Coagulation Profile: Recent Labs  Lab 07/08/24 1843  INR 1.4*    Cardiac Enzymes: No results for input(s): CKTOTAL, CKMB, CKMBINDEX, TROPONINI in the last 168 hours.  HbA1C: Hgb A1c MFr Bld  Date/Time Value Ref Range Status  01/02/2024 07:59 PM 6.3 (H) 4.8 - 5.6 % Final    Comment:    (NOTE) Diagnosis of Diabetes The following HbA1c ranges recommended by the American Diabetes Association (ADA) may be used as an aid in the diagnosis of diabetes mellitus.  Hemoglobin             Suggested A1C NGSP%              Diagnosis  <5.7                   Non Diabetic  5.7-6.4                Pre-Diabetic  >6.4                   Diabetic  <7.0                   Glycemic control for                       adults with diabetes.    12/01/2022 10:54 AM 6.4 (H) 4.8 - 5.6 % Final    Comment:  Prediabetes: 5.7 - 6.4          Diabetes: >6.4          Glycemic control for adults with diabetes: <7.0     CBG: No results for  input(s): GLUCAP in the last 168 hours.      Dorn Chill, MD [facility] Office: [phone]   See Amion for personal pager PCCM on call pager [phone] until 7pm. Please call Elink 7p-7a. [phone]            "

## 2024-07-09 NOTE — Progress Notes (Signed)
" °   07/09/24 0000  BiPAP/CPAP/SIPAP  BiPAP/CPAP/SIPAP Pt Type Adult  Reason BIPAP/CPAP not in use Non-compliant (Patient wants to hold off on wearing CPAP for the night. RT advised to call if he changes his mind.)  BiPAP/CPAP /SiPAP Vitals  Pulse Rate 80  Resp 14  BP (!) 153/60  SpO2 97 %  MEWS Score/Color  MEWS Score 0  MEWS Score Color Green    "

## 2024-07-09 NOTE — Plan of Care (Signed)
  Problem: Education: Goal: Knowledge of General Education information will improve Description: Including pain rating scale, medication(s)/side effects and non-pharmacologic comfort measures Outcome: Progressing   Problem: Clinical Measurements: Goal: Cardiovascular complication will be avoided Outcome: Progressing   Problem: Elimination: Goal: Will not experience complications related to urinary retention Outcome: Progressing   Problem: Pain Managment: Goal: General experience of comfort will improve and/or be controlled Outcome: Progressing   Problem: Safety: Goal: Ability to remain free from injury will improve Outcome: Progressing

## 2024-07-10 DIAGNOSIS — A419 Sepsis, unspecified organism: Secondary | ICD-10-CM

## 2024-07-10 DIAGNOSIS — R6521 Severe sepsis with septic shock: Secondary | ICD-10-CM

## 2024-07-10 DIAGNOSIS — N390 Urinary tract infection, site not specified: Secondary | ICD-10-CM | POA: Diagnosis not present

## 2024-07-10 DIAGNOSIS — B9620 Unspecified Escherichia coli [E. coli] as the cause of diseases classified elsewhere: Secondary | ICD-10-CM | POA: Diagnosis not present

## 2024-07-10 LAB — CBC
HCT: 33.3 % — ABNORMAL LOW (ref 39.0–52.0)
Hemoglobin: 10.9 g/dL — ABNORMAL LOW (ref 13.0–17.0)
MCH: 30.1 pg (ref 26.0–34.0)
MCHC: 32.7 g/dL (ref 30.0–36.0)
MCV: 92 fL (ref 80.0–100.0)
Platelets: 112 K/uL — ABNORMAL LOW (ref 150–400)
RBC: 3.62 MIL/uL — ABNORMAL LOW (ref 4.22–5.81)
RDW: 15.4 % (ref 11.5–15.5)
WBC: 7.6 K/uL (ref 4.0–10.5)
nRBC: 0 % (ref 0.0–0.2)

## 2024-07-10 LAB — COMPREHENSIVE METABOLIC PANEL WITH GFR
ALT: 22 U/L (ref 0–44)
AST: 20 U/L (ref 15–41)
Albumin: 3 g/dL — ABNORMAL LOW (ref 3.5–5.0)
Alkaline Phosphatase: 103 U/L (ref 38–126)
Anion gap: 10 (ref 5–15)
BUN: 16 mg/dL (ref 6–20)
CO2: 25 mmol/L (ref 22–32)
Calcium: 9.3 mg/dL (ref 8.9–10.3)
Chloride: 104 mmol/L (ref 98–111)
Creatinine, Ser: 0.91 mg/dL (ref 0.61–1.24)
GFR, Estimated: >60 mL/min
Glucose, Bld: 144 mg/dL — ABNORMAL HIGH (ref 70–99)
Potassium: 4.1 mmol/L (ref 3.5–5.1)
Sodium: 139 mmol/L (ref 135–145)
Total Bilirubin: 0.4 mg/dL (ref 0.0–1.2)
Total Protein: 6.6 g/dL (ref 6.5–8.1)

## 2024-07-10 LAB — MAGNESIUM: Magnesium: 2.2 mg/dL (ref 1.7–2.4)

## 2024-07-10 MED ORDER — ENOXAPARIN SODIUM 40 MG/0.4ML IJ SOSY: 40.0000 mg | PREFILLED_SYRINGE | INTRAMUSCULAR | Status: DC

## 2024-07-10 MED ORDER — ENOXAPARIN SODIUM 100 MG/ML IJ SOSY
90.0000 mg | PREFILLED_SYRINGE | INTRAMUSCULAR | Status: DC
  Administered 2024-07-10: 90 mg via SUBCUTANEOUS
  Filled 2024-07-10: qty 1

## 2024-07-10 MED ORDER — SODIUM CHLORIDE 0.9 % IV SOLN
2.0000 g | INTRAVENOUS | Status: DC
  Administered 2024-07-10: 2 g via INTRAVENOUS
  Filled 2024-07-10: qty 20

## 2024-07-10 MED ORDER — HYDROCORTISONE SOD SUC (PF) 100 MG IJ SOLR
50.0000 mg | Freq: Every day | INTRAMUSCULAR | Status: DC
  Administered 2024-07-11: 50 mg via INTRAVENOUS

## 2024-07-10 NOTE — Progress Notes (Signed)
" °   07/10/24 2200  BiPAP/CPAP/SIPAP  Reason BIPAP/CPAP not in use Non-compliant (Pt states he does not wish to wear CPAP tonight. RT told patient to have RN call RT if he changes his mind and RT will bring one to him.)    "

## 2024-07-10 NOTE — Plan of Care (Signed)

## 2024-07-10 NOTE — Plan of Care (Signed)

## 2024-07-10 NOTE — Progress Notes (Signed)
 " PROGRESS NOTE  Patrick Baxter FMW:[id number] DOB: 1971/05/01   PCP: Bulah Alm RAMAN, PA-C  Patient is from: Home.  Independently ambulates at baseline.  DOA: 07/08/2024 LOS: 2  Chief complaints Chief Complaint  Patient presents with   Emesis     Brief Narrative / Interim history: 53 year old M with PMH of OSA on CPAP, DM-2, HTN, hypothyroidism, obesity and recent hospitalization from 12/14-12/16 for complicated E. coli UTI due to obstructing 5 mm calculus in proximal right ureter with mild right hydronephrosis for which he was treated with cystoscopy and bilateral ureteral stent placement and antibiotics, sent to ED by urology for hypotension.  He had fever, chills, myalgia and weakness before presenting to urology.    In ED, he was hypotensive with WBC of 22 and lactic acid of 3.9.  Received fluid resuscitation and admitted to stepdown unit but remained hypotensive.  He was started on vasopressor due to concern for septic shock from E. coli urosepsis.  Eventually, patient came off vasopressors.  Lactic acidosis resolved.  Blood cultures NGTD.  Urine culture with 10,000 colonies of E. coli.  Transferred to hospitalist service on 12/28.  Antibiotic de-escalated to IV ceftriaxone .  Subjective: Seen and examined earlier this morning.  No major events overnight or this morning.  Reports feeling better.  No complaints.  Denies abdominal pain or acute back pain.   Assessment and plan: Septic shock 2/2 to E.coli Urosepsis: Present on admission.  Sepsis physiology resolved. S/p bilateral ureteral stent placement due to kidney stone -CT scan showing bilateral J stents are in proper position. No hydronephrosis. -Blood cultures NGTD.  Urine culture with E. Coli.  No history of ESBL or MDR -IV cefepime  12/26>> ceftriaxone  12/28>> -Urology recommended 14 days of culture specific antibiotics followed by prophylactic antibiotics for 30 days until outpatient follow-up with primary urologist -Wean  off stress dose steroid  OSA on CPAP: Not compliant with CPAP. -Encouraged to use CPAP.  Normocytic anemia: Relatively stable - Continue monitoring  Essential hypertension: Normotensive off home meds. - Continue holding home meds  Mood disorder: Stable - Resume home meds  History of gout - Continue home meds  Hypothyroidism - Continue home Synthroid    Morbid obesity Body mass index is 61.28 kg/m.          DVT prophylaxis:  SCDs Start: 07/08/24 1931  Code Status: Full code Family Communication: None at bedside Level of care: Progressive Status is: Inpatient Remains inpatient appropriate because: Urosepsis   Final disposition: Home   55 minutes with more than 50% spent in reviewing records, counseling patient/family and coordinating care.  Consultants:  Critical care  Procedures: None  Microbiology summarized: Blood cultures NGTD Urine culture with E. coli COVID-19, influenza and RSV PCR nonreactive  Objective: Vitals:   07/10/24 0100 07/10/24 0500 07/10/24 0557 07/10/24 0926  BP:   121/70 (!) 128/56  Pulse:   77 88  Resp: 20  16   Temp:   97.6 F (36.4 C)   TempSrc:   Oral   SpO2:   98%   Weight:  (!) 182.8 kg    Height:        Examination:  GENERAL: No apparent distress.  Nontoxic. HEENT: MMM.  Vision and hearing grossly intact.  NECK: Supple.  No apparent JVD.  RESP:  No IWOB.  Fair aeration bilaterally. CVS:  RRR. Heart sounds normal.  ABD/GI/GU: BS+. Abd soft, NTND.  MSK/EXT:  Moves extremities. No apparent deformity. No edema.  SKIN: no apparent skin lesion or  wound NEURO: AA.  Oriented appropriately.  No apparent focal neuro deficit. PSYCH: Calm. Normal affect.   Sch Meds:  Scheduled Meds:  allopurinol   300 mg Oral Daily   enoxaparin  (LOVENOX ) injection  90 mg Subcutaneous Q24H   feeding supplement  237 mL Oral BID BM   FLUoxetine   40 mg Oral Daily   [START ON 07/11/2024] hydrocortisone  sod succinate (SOLU-CORTEF ) inj  50 mg  Intravenous Daily   levothyroxine   25 mcg Oral Q0600   melatonin  3 mg Oral QHS   tamsulosin   0.4 mg Oral Daily   Continuous Infusions:  cefTRIAXone  (ROCEPHIN )  IV     PRN Meds:.acetaminophen  **OR** acetaminophen , bisacodyl , ondansetron  **OR** ondansetron  (ZOFRAN ) IV, mouth rinse  Antimicrobials: Anti-infectives (From admission, onward)    Start     Dose/Rate Route Frequency Ordered Stop   07/10/24 2200  cefTRIAXone  (ROCEPHIN ) 2 g in sodium chloride  0.9 % 100 mL IVPB        2 g 200 mL/hr over 30 Minutes Intravenous Every 24 hours 07/10/24 1337     07/09/24 0030  ceFEPIme  (MAXIPIME ) 2 g in sodium chloride  0.9 % 100 mL IVPB  Status:  Discontinued        2 g 200 mL/hr over 30 Minutes Intravenous Every 12 hours 07/08/24 1725 07/08/24 1754   07/08/24 2000  ceFEPIme  (MAXIPIME ) 2 g in sodium chloride  0.9 % 100 mL IVPB  Status:  Discontinued        2 g 200 mL/hr over 30 Minutes Intravenous Every 8 hours 07/08/24 1754 07/10/24 1337   07/08/24 1230  ceFEPIme  (MAXIPIME ) 2 g in sodium chloride  0.9 % 100 mL IVPB        2 g 200 mL/hr over 30 Minutes Intravenous  Once 07/08/24 1222 07/08/24 1314        I have personally reviewed the following labs and images: CBC: Recent Labs  Lab 07/08/24 1152 07/09/24 0511 07/10/24 0910  WBC 22.2* 13.7* 7.6  NEUTROABS 19.6*  --   --   HGB 12.1* 11.4* 10.9*  HCT 36.5* 35.3* 33.3*  MCV 92.6 92.7 92.0  PLT 143* 111* 112*   BMP &GFR Recent Labs  Lab 07/08/24 1152 07/09/24 0511 07/10/24 0910  NA 133* 139 139  K 4.5 4.1 4.1  CL 99 106 104  CO2 23 24 25   GLUCOSE 152* 163* 144*  BUN 18 16 16   CREATININE 1.17 1.02 0.91  CALCIUM  9.1 9.3 9.3  MG  --  2.1 2.2   Estimated Creatinine Clearance: 148.2 mL/min (by C-G formula based on SCr of 0.91 mg/dL). Liver & Pancreas: Recent Labs  Lab 07/08/24 1152 07/10/24 0910  AST 50* 20  ALT 42 22  ALKPHOS 121 103  BILITOT 1.4* 0.4  PROT 7.1 6.6  ALBUMIN 3.4* 3.0*   No results for input(s):  LIPASE, AMYLASE in the last 168 hours. No results for input(s): AMMONIA in the last 168 hours. Diabetic: No results for input(s): HGBA1C in the last 72 hours. No results for input(s): GLUCAP in the last 168 hours. Cardiac Enzymes: No results for input(s): CKTOTAL, CKMB, CKMBINDEX, TROPONINI in the last 168 hours. No results for input(s): PROBNP in the last 8760 hours. Coagulation Profile: Recent Labs  Lab 07/08/24 1843  INR 1.4*   Thyroid  Function Tests: No results for input(s): TSH, T4TOTAL, FREET4, T3FREE, THYROIDAB in the last 72 hours. Lipid Profile: No results for input(s): CHOL, HDL, LDLCALC, TRIG, CHOLHDL, LDLDIRECT in the last 72 hours. Anemia Panel: No results for  input(s): VITAMINB12, FOLATE, FERRITIN, TIBC, IRON, RETICCTPCT in the last 72 hours. Urine analysis:    Component Value Date/Time   COLORURINE AMBER (A) 07/08/2024 1410   APPEARANCEUR TURBID (A) 07/08/2024 1410   APPEARANCEUR Cloudy (A) 05/13/2024 1047   LABSPEC 1.009 07/08/2024 1410   LABSPEC 1.010 06/16/2024 1119   PHURINE 6.0 07/08/2024 1410   GLUCOSEU NEGATIVE 07/08/2024 1410   HGBUR MODERATE (A) 07/08/2024 1410   BILIRUBINUR NEGATIVE 07/08/2024 1410   BILIRUBINUR negative 06/16/2024 1119   BILIRUBINUR Negative 05/13/2024 1047   KETONESUR NEGATIVE 07/08/2024 1410   PROTEINUR 100 (A) 07/08/2024 1410   UROBILINOGEN negative (A) 10/28/2016 1044   NITRITE POSITIVE (A) 07/08/2024 1410   LEUKOCYTESUR LARGE (A) 07/08/2024 1410   Sepsis Labs: Invalid input(s): PROCALCITONIN, LACTICIDVEN  Microbiology: Recent Results (from the past 240 hours)  Culture, blood (Routine x 2)     Status: None (Preliminary result)   Collection Time: 07/08/24 11:52 AM   Specimen: BLOOD  Result Value Ref Range Status   Specimen Description   Final    BLOOD RIGHT ANTECUBITAL Performed at [facility], [address], [place], [place] [address]     Special Requests   Final    BOTTLES DRAWN AEROBIC AND ANAEROBIC Blood Culture adequate volume Performed at [facility], [address] [place], [place] [address]    Culture   Final    NO GROWTH 2 DAYS Performed at [facility], [address], [place], [place] [address]    Report Status PENDING  Incomplete  Culture, blood (Routine x 2)     Status: None (Preliminary result)   Collection Time: 07/08/24 11:55 AM   Specimen: BLOOD LEFT HAND  Result Value Ref Range Status   Specimen Description   Final    BLOOD LEFT HAND Performed at [facility], [address], [place], [place] [address]    Special Requests   Final    BOTTLES DRAWN AEROBIC AND ANAEROBIC Blood Culture adequate volume Performed at [facility], [address] [place], [place] [address]    Culture   Final    NO GROWTH 2 DAYS Performed at [facility], [address] [place], [place] [address]    Report Status PENDING  Incomplete  Resp panel by RT-PCR (RSV, Flu A&B, Covid) Anterior Nasal Swab     Status: None   Collection Time: 07/08/24  1:20 PM   Specimen: Anterior Nasal Swab  Result Value Ref Range Status   SARS Coronavirus 2 by RT PCR NEGATIVE NEGATIVE Final    Comment: (NOTE) SARS-CoV-2 target nucleic acids are NOT DETECTED.  The SARS-CoV-2 RNA is generally detectable in upper respiratory specimens during the acute phase of infection. The lowest concentration of SARS-CoV-2 viral copies this assay can detect is 138 copies/mL. A negative result does not preclude SARS-Cov-2 infection and should not be used as the sole basis for treatment or other patient management decisions. A negative result may occur with  improper specimen collection/handling, submission of specimen other than nasopharyngeal swab, presence of viral mutation(s) within the areas targeted by this assay, and inadequate number of viral copies(<138 copies/mL). A negative result must be  combined with clinical observations, patient history, and epidemiological information. The expected result is Negative.  Fact Sheet for Patients:  bloggercourse.com  Fact Sheet for Healthcare Providers:  seriousbroker.it  This test is no t yet approved or cleared by the [place]  FDA and  has been authorized  for detection and/or diagnosis of SARS-CoV-2 by FDA under an Emergency Use Authorization (EUA). This EUA will remain  in effect (meaning this test can be used) for the duration of the COVID-19 declaration under Section 564(b)(1) of the Act, 21 U.S.C.section 360bbb-3(b)(1), unless the authorization is terminated  or revoked sooner.       Influenza A by PCR NEGATIVE NEGATIVE Final   Influenza B by PCR NEGATIVE NEGATIVE Final    Comment: (NOTE) The Xpert Xpress SARS-CoV-2/FLU/RSV plus assay is intended as an aid in the diagnosis of influenza from Nasopharyngeal swab specimens and should not be used as a sole basis for treatment. Nasal washings and aspirates are unacceptable for Xpert Xpress SARS-CoV-2/FLU/RSV testing.  Fact Sheet for Patients: bloggercourse.com  Fact Sheet for Healthcare Providers: seriousbroker.it  This test is not yet approved or cleared by the [place]  FDA and has been authorized for detection and/or diagnosis of SARS-CoV-2 by FDA under an Emergency Use Authorization (EUA). This EUA will remain in effect (meaning this test can be used) for the duration of the COVID-19 declaration under Section 564(b)(1) of the Act, 21 U.S.C. section 360bbb-3(b)(1), unless the authorization is terminated or revoked.     Resp Syncytial Virus by PCR NEGATIVE NEGATIVE Final    Comment: (NOTE) Fact Sheet for Patients: bloggercourse.com  Fact Sheet for Healthcare Providers: seriousbroker.it  This test is not yet  approved or cleared by the [place]  FDA and has been authorized for detection and/or diagnosis of SARS-CoV-2 by FDA under an Emergency Use Authorization (EUA). This EUA will remain in effect (meaning this test can be used) for the duration of the COVID-19 declaration under Section 564(b)(1) of the Act, 21 U.S.C. section 360bbb-3(b)(1), unless the authorization is terminated or revoked.  Performed at [facility], [address] [place], [place] [address]   Urine Culture     Status: Abnormal (Preliminary result)   Collection Time: 07/08/24  2:10 PM   Specimen: Urine, Random  Result Value Ref Range Status   Specimen Description   Final    URINE, RANDOM Performed at [facility], [address] [place], [place] [address]    Special Requests   Final    NONE Reflexed from [phone] Performed at [facility] [address] [place], [place] [address]    Culture (A)  Final    10,000 COLONIES/mL ESCHERICHIA COLI SUSCEPTIBILITIES TO FOLLOW Performed at [facility], [address], [place], [place] [address]    Report Status PENDING  Incomplete    Radiology Studies: No results found.    Hildy Nicholl T. Maricela Kawahara [facility]  If 7PM-7AM, please contact night-coverage [email or web address] 07/10/2024, 1:42 PM   "

## 2024-07-10 NOTE — Progress Notes (Signed)
" °   07/10/24 0100  BiPAP/CPAP/SIPAP  BiPAP/CPAP/SIPAP Pt Type Adult  Reason BIPAP/CPAP not in use Non-compliant (encouraged pt to call if he changes his mind)  BiPAP/CPAP /SiPAP Vitals  Resp 20  MEWS Score/Color  MEWS Score 0  MEWS Score Color Green    "

## 2024-07-11 ENCOUNTER — Other Ambulatory Visit (HOSPITAL_COMMUNITY): Payer: Self-pay

## 2024-07-11 DIAGNOSIS — R6521 Severe sepsis with septic shock: Secondary | ICD-10-CM | POA: Diagnosis not present

## 2024-07-11 DIAGNOSIS — N390 Urinary tract infection, site not specified: Secondary | ICD-10-CM | POA: Diagnosis not present

## 2024-07-11 DIAGNOSIS — A419 Sepsis, unspecified organism: Secondary | ICD-10-CM | POA: Diagnosis not present

## 2024-07-11 DIAGNOSIS — B9620 Unspecified Escherichia coli [E. coli] as the cause of diseases classified elsewhere: Secondary | ICD-10-CM | POA: Diagnosis not present

## 2024-07-11 LAB — CBC
HCT: 30.8 % — ABNORMAL LOW (ref 39.0–52.0)
Hemoglobin: 10.1 g/dL — ABNORMAL LOW (ref 13.0–17.0)
MCH: 30.1 pg (ref 26.0–34.0)
MCHC: 32.8 g/dL (ref 30.0–36.0)
MCV: 91.9 fL (ref 80.0–100.0)
Platelets: 117 K/uL — ABNORMAL LOW (ref 150–400)
RBC: 3.35 MIL/uL — ABNORMAL LOW (ref 4.22–5.81)
RDW: 15.3 % (ref 11.5–15.5)
WBC: 5.8 K/uL (ref 4.0–10.5)
nRBC: 0 % (ref 0.0–0.2)

## 2024-07-11 LAB — RENAL FUNCTION PANEL
Albumin: 2.9 g/dL — ABNORMAL LOW (ref 3.5–5.0)
Anion gap: 12 (ref 5–15)
BUN: 14 mg/dL (ref 6–20)
CO2: 23 mmol/L (ref 22–32)
Calcium: 8.8 mg/dL — ABNORMAL LOW (ref 8.9–10.3)
Chloride: 104 mmol/L (ref 98–111)
Creatinine, Ser: 0.89 mg/dL (ref 0.61–1.24)
GFR, Estimated: >60 mL/min
Glucose, Bld: 111 mg/dL — ABNORMAL HIGH (ref 70–99)
Phosphorus: 3.3 mg/dL (ref 2.5–4.6)
Potassium: 3.5 mmol/L (ref 3.5–5.1)
Sodium: 138 mmol/L (ref 135–145)

## 2024-07-11 LAB — MAGNESIUM: Magnesium: 2 mg/dL (ref 1.7–2.4)

## 2024-07-11 LAB — URINE CULTURE: Culture: 10000 — AB

## 2024-07-11 LAB — CG4 I-STAT (LACTIC ACID): Lactic Acid, Venous: 3.1 mmol/L (ref 0.5–1.9)

## 2024-07-11 MED ORDER — NITROFURANTOIN MACROCRYSTAL 50 MG PO CAPS
50.0000 mg | ORAL_CAPSULE | Freq: Every day | ORAL | 0 refills | Status: DC
  Filled 2024-07-11: qty 30, 30d supply, fill #0

## 2024-07-11 MED ORDER — SALINE SPRAY 0.65 % NA SOLN
1.0000 | NASAL | Status: DC | PRN
  Administered 2024-07-11: 1 via NASAL
  Filled 2024-07-11: qty 44

## 2024-07-11 MED ORDER — CEFADROXIL 500 MG PO CAPS
1000.0000 mg | ORAL_CAPSULE | Freq: Two times a day (BID) | ORAL | 0 refills | Status: AC
  Filled 2024-07-11: qty 48, 12d supply, fill #0

## 2024-07-11 NOTE — Discharge Summary (Signed)
 "  Physician Discharge Summary  Rogelio Waynick FMW:[id number] DOB: 1970-10-25 DOA: 07/08/2024  PCP: Bulah Alm RAMAN, PA-C  Admit date: 07/08/2024 Discharge date: 07/11/2024  Admitted From: Home Disposition: Home Recommendations for Outpatient Follow-up:  Outpatient follow-up with PCP in 1 to 2 weeks Patient has upcoming appointment with urology next month Check CMP and CBC at follow-up Please follow up on the following pending results: None  Home Health: No need identified Equipment/Devices: No need identified  Discharge Condition: Stable CODE STATUS: Full code   Follow-up Information     Bulah Alm RAMAN, PA-C. Call in 1 week(s).   Specialty: Family Medicine Contact information: [address] [place] [address] [phone]                 Hospital course 53 year old M with PMH of OSA on CPAP, DM-2, HTN, hypothyroidism, obesity and recent hospitalization from 12/14-12/16 for complicated E. coli UTI due to obstructing 5 mm calculus in proximal right ureter with mild right hydronephrosis for which he was treated with cystoscopy and bilateral ureteral stent placement and antibiotics, sent to ED by urology for hypotension.  He had fever, chills, myalgia and weakness before presenting to urology.     In ED, he was hypotensive with WBC of 22 and lactic acid of 3.9.  Received fluid resuscitation and admitted to stepdown unit but remained hypotensive.  He was started on vasopressor due to concern for septic shock from E. coli urosepsis.   Eventually, patient came off vasopressors.  Lactic acidosis resolved.  Blood cultures NGTD.  Urine culture with 10,000 colonies of E. coli.  Transferred to hospitalist service on 12/28.  Antibiotic de-escalated to IV ceftriaxone .  E. coli pansensitive except to Cipro .  Patient is discharged on p.o. cefadroxil  for 12 more days followed by p.o. Macrobid  50 mg daily for suppression afterward until his follow-up with primary urologist per  recommendation by in-house urologist.   See individual problem list below for more.   Problems addressed during this hospitalization Septic shock 2/2 to E.coli Urosepsis: Present on admission.  Sepsis physiology resolved. S/p bilateral ureteral stent placement due to kidney stone -CT scan showing bilateral J stents are in proper position. No hydronephrosis. -Blood cultures NGTD.  Urine culture with E. Coli.  No history of ESBL or MDR -IV cefepime  12/26>> ceftriaxone  12/28>> p.o. cefadroxil  1 g twice daily for 12 more days followed by Macrobid  50 mg daily per recommendation by urology until his outpatient follow-up with primary urologist.   OSA on CPAP: Not compliant with CPAP. -Encouraged to use CPAP.   Normocytic anemia: Relatively stable - Continue monitoring   Essential hypertension: Normotensive off home Toprol -XL. -Discontinued Toprol -XL on discharge.   Mood disorder: Stable - Resume home meds   History of gout - Continue home meds   Hypothyroidism - Continue home Synthroid    Morbid obesity Body mass index is 61.28 kg/m.  -Encourage lifestyle change to lose weight -Consider referral to bariatric surgery -Consider GLP-1 agonists         Consultations: Critical care Urology  Time spent 35  minutes  Vital signs Vitals:   07/10/24 1407 07/10/24 2004 07/11/24 0450 07/11/24 1000  BP: (!) 153/86 130/73 102/61 137/66  Pulse: 96 100 92 92  Temp: 98.4 F (36.9 C) 98.4 F (36.9 C) 98.1 F (36.7 C) 98.3 F (36.8 C)  Resp: 18 16 16    Height:      Weight:      SpO2: 100% 98% 96% 99%  TempSrc: Oral Oral  Oral Oral  BMI (Calculated):         Discharge exam  GENERAL: No apparent distress.  Nontoxic. HEENT: MMM.  Vision and hearing grossly intact.  NECK: Supple.  No apparent JVD.  RESP:  No IWOB.  Fair aeration bilaterally. CVS:  RRR. Heart sounds normal.  ABD/GI/GU: BS+. Abd soft, NTND.  MSK/EXT:  Moves extremities. No apparent deformity. No edema.  SKIN:  no apparent skin lesion or wound NEURO: Awake and alert. Oriented appropriately.  No apparent focal neuro deficit. PSYCH: Calm. Normal affect.   Discharge Instructions Discharge Instructions     Discharge instructions   Complete by: As directed    It has been a pleasure taking care of you!  You were hospitalized for urinary tract infection for which you have been treated with antibiotics.  We are discharging you on more antibiotics to complete treatment course.  Please take your medications as prescribed.  Follow-up with urology per their recommendation.  Follow-up with your primary care doctor in 1 to 2 weeks or sooner if needed.   Take care,   Increase activity slowly   Complete by: As directed       Allergies as of 07/11/2024       Reactions   Atorvastatin  Other (See Comments)   Myalgia    Pravastatin  Sodium Other (See Comments)   Myalgia        Medication List     STOP taking these medications    Aleve  220 MG tablet Generic drug: naproxen  sodium   metoprolol  succinate 50 MG 24 hr tablet Commonly known as: TOPROL -XL       TAKE these medications    acetaminophen  500 MG tablet Commonly known as: TYLENOL  Take 1,000 mg by mouth every 6 (six) hours as needed for mild pain (pain score 1-3) or fever.   albuterol  108 (90 Base) MCG/ACT inhaler Commonly known as: VENTOLIN  HFA Inhale 2 puffs into the lungs every 6 (six) hours as needed for wheezing or shortness of breath.   allopurinol  300 MG tablet Commonly known as: ZYLOPRIM  TAKE 1 TABLET(300 MG) BY MOUTH DAILY What changed: See the new instructions.   cefadroxil  500 MG capsule Commonly known as: DURICEF Take 2 capsules (1,000 mg total) by mouth 2 (two) times daily for 12 days.   cetirizine 10 MG tablet Commonly known as: ZYRTEC Take 10 mg by mouth daily as needed for allergies or rhinitis.   cholecalciferol 25 MCG (1000 UNIT) tablet Commonly known as: VITAMIN D3 Take 1 tablet (1,000 Units total) by  mouth daily.   clotrimazole -betamethasone  cream Commonly known as: LOTRISONE  Apply 1 Application topically daily.   colchicine  0.6 MG tablet Take 1 tablet (0.6 mg total) by mouth daily as needed (for gout). What changed: reasons to take this   cyclobenzaprine  10 MG tablet Commonly known as: FLEXERIL  Take 1 tablet (10 mg total) by mouth 3 (three) times daily as needed for muscle spasms.   fenofibrate  145 MG tablet Commonly known as: Tricor  Take 1 tablet (145 mg total) by mouth daily.   FLUoxetine  40 MG capsule Commonly known as: PROZAC  TAKE 1 CAPSULE(40 MG) BY MOUTH DAILY What changed:  how much to take how to take this when to take this additional instructions   gabapentin 300 MG capsule Commonly known as: NEURONTIN Take 300 mg by mouth at bedtime.   levothyroxine  25 MCG tablet Commonly known as: SYNTHROID  TAKE 1 TABLET BY MOUTH EVERY DAY BEFORE BREAKFAST What changed: See the new instructions.   lisinopril   20 MG tablet Commonly known as: ZESTRIL  TAKE 1 TABLET(20 MG) BY MOUTH DAILY   meclizine  25 MG tablet Commonly known as: ANTIVERT  Take 1 tablet (25 mg total) by mouth 2 (two) times daily as needed for dizziness.   montelukast  10 MG tablet Commonly known as: SINGULAIR  TAKE 1 TABLET(10 MG) BY MOUTH AT BEDTIME   multivitamin with minerals tablet Take 1 tablet by mouth daily.   mupirocin  ointment 2 % Commonly known as: BACTROBAN  Place 1 Application into the nose 3 (three) times daily.   nitrofurantoin  50 MG capsule Commonly known as: MACRODANTIN  Take 1 capsule (50 mg total) by mouth at bedtime. Start when you finish Cefadroxil  Start taking on: July 26, 2024   nitroGLYCERIN  0.4 MG SL tablet Commonly known as: NITROSTAT  Place 1 tablet (0.4 mg total) under the tongue every 5 (five) minutes as needed for chest pain.   nystatin  powder Commonly known as: MYCOSTATIN /NYSTOP  Apply 1 Application topically 2 (two) times daily.   oxybutynin  10 MG 24 hr  tablet Commonly known as: DITROPAN -XL Take 1 tablet (10 mg total) by mouth daily.   Rybelsus  14 MG Tabs Generic drug: Semaglutide  Take 14 mg by mouth in the morning.   sodium chloride  0.65 % Soln nasal spray Commonly known as: OCEAN Place 1 spray into both nostrils as needed for congestion.   tamsulosin  0.4 MG Caps capsule Commonly known as: FLOMAX  Take 0.4 mg by mouth daily.         Procedures/Studies:   CT Renal Stone Study Result Date: 07/08/2024 EXAM: CT UROGRAM 07/08/2024 02:22:53 PM TECHNIQUE: CT of the abdomen and pelvis was performed without the administration of intravenous contrast. Multiplanar reformatted images as well as MIP urogram images are provided for review. Automated exposure control, iterative reconstruction, and/or weight based adjustment of the mA/kV was utilized to reduce the radiation dose to as low as reasonably achievable. COMPARISON: CT 06/26/2024. CLINICAL HISTORY: hx of ureteral obstruciton, hydroneprhosis, recent stent FINDINGS: LOWER CHEST: No acute abnormality. LIVER: The liver has a macronodular contour. GALLBLADDER AND BILE DUCTS: Gallbladder is unremarkable. No biliary ductal dilatation. SPLEEN: No acute abnormality. PANCREAS: No acute abnormality. ADRENAL GLANDS: No acute abnormality. KIDNEYS, URETERS AND BLADDER: Interval placement of bilateral double J ureteral stents. Stents appear in proper position. There is no hydronephrosis or hydroureter. There are several calculi within the right kidney. There is a proximal right ureteral calculus at the same location as the pre-stenting exam. The calculus measures 6 mm in coronal projection (image 92 of series 5). No perinephric or periureteral stranding. Urinary bladder is unremarkable. GI AND BOWEL: Stomach demonstrates no acute abnormality. There is no bowel obstruction. PERITONEUM AND RETROPERITONEUM: No ascites. No free air. VASCULATURE: Aorta is normal in caliber. LYMPH NODES: No lymphadenopathy.  REPRODUCTIVE ORGANS: No acute abnormality. BONES AND SOFT TISSUES: No acute osseous abnormality. No focal soft tissue abnormality. IMPRESSION: 1. Bilateral double J ureteral stents are in proper position. 2. No hydronephrosis or hydroureter. 3. Proximal right ureteral calculus measuring 6 mm, at the same location as the pre-stenting exam. 4. Morphologic changes may indicate early cirrhosis. Electronically signed by: Norleen Boxer MD 07/08/2024 04:08 PM EST RP Workstation: [id number]   DG Chest Port 1 View if patient is in a treatment room. Result Date: 07/08/2024 CLINICAL DATA:  Concern for sepsis. EXAM: PORTABLE CHEST 1 VIEW COMPARISON:  06/26/2024. FINDINGS: Low lung volumes. The heart size and mediastinal contours are within normal limits. No appreciable focal consolidation, sizeable pleural effusion, or pneumothorax. No acute osseous abnormality. IMPRESSION:  Low lung volumes.  No acute cardiopulmonary findings. Electronically Signed   By: Harrietta Sherry M.D.   On: 07/08/2024 13:53   DG C-Arm 1-60 Min-No Report Result Date: 06/26/2024 Fluoroscopy was utilized by the requesting physician.  No radiographic interpretation.   CT ABDOMEN PELVIS W CONTRAST Result Date: 06/26/2024 EXAM: CT ABDOMEN AND PELVIS WITH CONTRAST 06/26/2024 07:32:20 PM TECHNIQUE: CT of the abdomen and pelvis was performed with the administration of 125 mL of iohexol  (OMNIPAQUE ) 300 MG/ML solution. Multiplanar reformatted images are provided for review. Automated exposure control, iterative reconstruction, and/or weight-based adjustment of the mA/kV was utilized to reduce the radiation dose to as low as reasonably achievable. COMPARISON: CT abdomen and pelvis 01/02/2024. CLINICAL HISTORY: Sepsis. FINDINGS: LOWER CHEST: No acute abnormality. LIVER: The liver is moderately enlarged, unchanged. GALLBLADDER AND BILE DUCTS: Gallbladder is unremarkable. No biliary ductal dilatation. SPLEEN: The spleen is moderately enlarged, unchanged.  PANCREAS: No acute abnormality. ADRENAL GLANDS: No acute abnormality. KIDNEYS, URETERS AND BLADDER: There is a 5 mm calculus in the proximal right ureter. There is mild right-sided hydronephrosis. There are additional punctate nonobstructing right renal calculi. There is a questionable 2 mm calculus in the proximal left ureter. There is no left-sided hydronephrosis. There is mild nonspecific bilateral perinephric fat stranding. Urinary bladder is unremarkable. GI AND BOWEL: Stomach demonstrates no acute abnormality. The appendix appears normal. There is no bowel obstruction. PERITONEUM AND RETROPERITONEUM: No ascites. No free air. VASCULATURE: Aorta is normal in caliber. LYMPH NODES: There are enlarged periportal lymph nodes measuring up to 12 mm short axis which have slightly increased in size. REPRODUCTIVE ORGANS: No acute abnormality. BONES AND SOFT TISSUES: Severe degenerative changes of the spine. No acute osseous abnormality. No focal soft tissue abnormality. IMPRESSION: 1. 5 mm calculus in the proximal right ureter with mild right-sided hydronephrosis. 2. Questionable 2 mm calculus in the proximal left ureter without left-sided hydronephrosis. 3. Mild nonspecific bilateral perinephric fat stranding. 4. Moderately enlarged liver and spleen, unchanged. 5. Enlarged periportal lymph nodes measuring up to 12 mm short axis, slightly increased in size. Electronically signed by: Greig Pique MD 06/26/2024 07:48 PM EST RP Workstation: [id number]   DG Chest Port 1 View Result Date: 06/26/2024 CLINICAL DATA:  Questionable sepsis - evaluate for abnormality EXAM: PORTABLE CHEST 1 VIEW COMPARISON:  February 10, 2024 FINDINGS: Evaluation is limited by underpenetration. The cardiomediastinal silhouette is unchanged in contour. No pleural effusion. No pneumothorax. No acute pleuroparenchymal abnormality. IMPRESSION: No acute cardiopulmonary abnormality. Electronically Signed   By: Corean Salter M.D.   On: 06/26/2024  18:16       The results of significant diagnostics from this hospitalization (including imaging, microbiology, ancillary and laboratory) are listed below for reference.     Microbiology: Recent Results (from the past 240 hours)  Culture, blood (Routine x 2)     Status: None (Preliminary result)   Collection Time: 07/08/24 11:52 AM   Specimen: BLOOD  Result Value Ref Range Status   Specimen Description   Final    BLOOD RIGHT ANTECUBITAL Performed at [facility], [address], [place], [place] [address]    Special Requests   Final    BOTTLES DRAWN AEROBIC AND ANAEROBIC Blood Culture adequate volume Performed at [facility], [address] [place], [place] [address]    Culture   Final    NO GROWTH 3 DAYS Performed at [facility], [address], [place], [place] [address]    Report Status PENDING  Incomplete  Culture, blood (Routine x 2)     Status: None (Preliminary result)   Collection Time: 07/08/24 11:55 AM   Specimen: BLOOD LEFT HAND  Result Value Ref Range Status   Specimen Description   Final    BLOOD LEFT HAND Performed at [facility], [address] [place], [place] [address]    Special Requests   Final    BOTTLES DRAWN AEROBIC AND ANAEROBIC Blood Culture adequate volume Performed at [facility], [address] [place], [place] [address]    Culture   Final    NO GROWTH 3 DAYS Performed at [facility], [address] [place], [place] [address]    Report Status PENDING  Incomplete  Resp panel by RT-PCR (RSV, Flu A&B, Covid) Anterior Nasal Swab     Status: None   Collection Time: 07/08/24  1:20 PM   Specimen: Anterior Nasal Swab  Result Value Ref Range Status   SARS Coronavirus 2 by RT PCR NEGATIVE NEGATIVE Final    Comment: (NOTE) SARS-CoV-2 target nucleic acids are NOT DETECTED.  The SARS-CoV-2 RNA is generally detectable in upper respiratory specimens during the acute  phase of infection. The lowest concentration of SARS-CoV-2 viral copies this assay can detect is 138 copies/mL. A negative result does not preclude SARS-Cov-2 infection and should not be used as the sole basis for treatment or other patient management decisions. A negative result may occur with  improper specimen collection/handling, submission of specimen other than nasopharyngeal swab, presence of viral mutation(s) within the areas targeted by this assay, and inadequate number of viral copies(<138 copies/mL). A negative result must be combined with clinical observations, patient history, and epidemiological information. The expected result is Negative.  Fact Sheet for Patients:  bloggercourse.com  Fact Sheet for Healthcare Providers:  seriousbroker.it  This test is no t yet approved or cleared by the [place]  FDA and  has been authorized for detection and/or diagnosis of SARS-CoV-2 by FDA under an Emergency Use Authorization (EUA). This EUA will remain  in effect (meaning this test can be used) for the duration of the COVID-19 declaration under Section 564(b)(1) of the Act, 21 U.S.C.section 360bbb-3(b)(1), unless the authorization is terminated  or revoked sooner.       Influenza A by PCR NEGATIVE NEGATIVE Final   Influenza B by PCR NEGATIVE NEGATIVE Final    Comment: (NOTE) The Xpert Xpress SARS-CoV-2/FLU/RSV plus assay is intended as an aid in the diagnosis of influenza from Nasopharyngeal swab specimens and should not be used as a sole basis for treatment. Nasal washings and aspirates are unacceptable for Xpert Xpress SARS-CoV-2/FLU/RSV testing.  Fact Sheet for Patients: bloggercourse.com  Fact Sheet for Healthcare Providers: seriousbroker.it  This test is not yet approved or cleared by the [place]  FDA and has been authorized for detection and/or diagnosis of  SARS-CoV-2 by FDA under an Emergency Use Authorization (EUA). This EUA will remain in effect (meaning this test can be used) for the duration of the COVID-19 declaration under Section 564(b)(1) of the Act, 21 U.S.C. section 360bbb-3(b)(1), unless the authorization is terminated or revoked.     Resp Syncytial Virus by PCR NEGATIVE NEGATIVE Final    Comment: (NOTE) Fact Sheet for Patients: bloggercourse.com  Fact Sheet for Healthcare Providers: seriousbroker.it  This test is not yet approved or cleared by the [place]  FDA and has been authorized for detection and/or diagnosis of SARS-CoV-2 by FDA under an Emergency Use Authorization (EUA). This EUA  will remain in effect (meaning this test can be used) for the duration of the COVID-19 declaration under Section 564(b)(1) of the Act, 21 U.S.C. section 360bbb-3(b)(1), unless the authorization is terminated or revoked.  Performed at [facility], [address] [place], [place] [address]   Urine Culture     Status: Abnormal   Collection Time: 07/08/24  2:10 PM   Specimen: Urine, Random  Result Value Ref Range Status   Specimen Description   Final    URINE, RANDOM Performed at [facility], [address], [place], [place] [address]    Special Requests   Final    NONE Reflexed from [id number] Performed at [facility], [address], [place], [place] [address]    Culture 10,000 COLONIES/mL ESCHERICHIA COLI (A)  Final   Report Status 07/11/2024 FINAL  Final   Organism ID, Bacteria ESCHERICHIA COLI (A)  Final      Susceptibility   Escherichia coli - MIC*    AMPICILLIN 4 SENSITIVE Sensitive     CEFAZOLIN (URINE) Value in next row Sensitive      <=1 SENSITIVEThis is a modified FDA-approved test that has been validated and its performance characteristics determined by the reporting laboratory.  This laboratory is certified  under the Clinical Laboratory Improvement Amendments CLIA as qualified to perform high complexity clinical laboratory testing.    CEFEPIME  Value in next row Sensitive      <=1 SENSITIVEThis is a modified FDA-approved test that has been validated and its performance characteristics determined by the reporting laboratory.  This laboratory is certified under the Clinical Laboratory Improvement Amendments CLIA as qualified to perform high complexity clinical laboratory testing.    ERTAPENEM Value in next row Sensitive      <=1 SENSITIVEThis is a modified FDA-approved test that has been validated and its performance characteristics determined by the reporting laboratory.  This laboratory is certified under the Clinical Laboratory Improvement Amendments CLIA as qualified to perform high complexity clinical laboratory testing.    CEFTRIAXONE  Value in next row Sensitive      <=1 SENSITIVEThis is a modified FDA-approved test that has been validated and its performance characteristics determined by the reporting laboratory.  This laboratory is certified under the Clinical Laboratory Improvement Amendments CLIA as qualified to perform high complexity clinical laboratory testing.    CIPROFLOXACIN  Value in next row Resistant      <=1 SENSITIVEThis is a modified FDA-approved test that has been validated and its performance characteristics determined by the reporting laboratory.  This laboratory is certified under the Clinical Laboratory Improvement Amendments CLIA as qualified to perform high complexity clinical laboratory testing.    GENTAMICIN Value in next row Sensitive      <=1 SENSITIVEThis is a modified FDA-approved test that has been validated and its performance characteristics determined by the reporting laboratory.  This laboratory is certified under the Clinical Laboratory Improvement Amendments CLIA as qualified to perform high complexity clinical laboratory testing.    NITROFURANTOIN  Value in next row  Sensitive      <=1 SENSITIVEThis is a modified FDA-approved test that has been validated and its performance characteristics determined by the reporting laboratory.  This laboratory is certified under the Clinical Laboratory Improvement Amendments CLIA as qualified to perform high complexity clinical laboratory testing.    TRIMETH/SULFA Value in next row Sensitive      <=1 SENSITIVEThis is a modified FDA-approved test that has been validated and its performance characteristics determined by the reporting  laboratory.  This laboratory is certified under the Clinical Laboratory Improvement Amendments CLIA as qualified to perform high complexity clinical laboratory testing.    AMPICILLIN/SULBACTAM Value in next row Sensitive      <=1 SENSITIVEThis is a modified FDA-approved test that has been validated and its performance characteristics determined by the reporting laboratory.  This laboratory is certified under the Clinical Laboratory Improvement Amendments CLIA as qualified to perform high complexity clinical laboratory testing.    PIP/TAZO Value in next row Sensitive      <=4 SENSITIVEThis is a modified FDA-approved test that has been validated and its performance characteristics determined by the reporting laboratory.  This laboratory is certified under the Clinical Laboratory Improvement Amendments CLIA as qualified to perform high complexity clinical laboratory testing.    MEROPENEM Value in next row Sensitive      <=4 SENSITIVEThis is a modified FDA-approved test that has been validated and its performance characteristics determined by the reporting laboratory.  This laboratory is certified under the Clinical Laboratory Improvement Amendments CLIA as qualified to perform high complexity clinical laboratory testing.    * 10,000 COLONIES/mL ESCHERICHIA COLI     Labs:  CBC: Recent Labs  Lab 07/08/24 1152 07/09/24 0511 07/10/24 0910 07/11/24 0818  WBC 22.2* 13.7* 7.6 5.8  NEUTROABS 19.6*  --    --   --   HGB 12.1* 11.4* 10.9* 10.1*  HCT 36.5* 35.3* 33.3* 30.8*  MCV 92.6 92.7 92.0 91.9  PLT 143* 111* 112* 117*   BMP &GFR Recent Labs  Lab 07/08/24 1152 07/09/24 0511 07/10/24 0910 07/11/24 0818  NA 133* 139 139 138  K 4.5 4.1 4.1 3.5  CL 99 106 104 104  CO2 23 24 25 23   GLUCOSE 152* 163* 144* 111*  BUN 18 16 16 14   CREATININE 1.17 1.02 0.91 0.89  CALCIUM  9.1 9.3 9.3 8.8*  MG  --  2.1 2.2 2.0  PHOS  --   --   --  3.3   Estimated Creatinine Clearance: 151.5 mL/min (by C-G formula based on SCr of 0.89 mg/dL). Liver & Pancreas: Recent Labs  Lab 07/08/24 1152 07/10/24 0910 07/11/24 0818  AST 50* 20  --   ALT 42 22  --   ALKPHOS 121 103  --   BILITOT 1.4* 0.4  --   PROT 7.1 6.6  --   ALBUMIN 3.4* 3.0* 2.9*   No results for input(s): LIPASE, AMYLASE in the last 168 hours. No results for input(s): AMMONIA in the last 168 hours. Diabetic: No results for input(s): HGBA1C in the last 72 hours. No results for input(s): GLUCAP in the last 168 hours. Cardiac Enzymes: No results for input(s): CKTOTAL, CKMB, CKMBINDEX, TROPONINI in the last 168 hours. No results for input(s): PROBNP in the last 8760 hours. Coagulation Profile: Recent Labs  Lab 07/08/24 1843  INR 1.4*   Thyroid  Function Tests: No results for input(s): TSH, T4TOTAL, FREET4, T3FREE, THYROIDAB in the last 72 hours. Lipid Profile: No results for input(s): CHOL, HDL, LDLCALC, TRIG, CHOLHDL, LDLDIRECT in the last 72 hours. Anemia Panel: No results for input(s): VITAMINB12, FOLATE, FERRITIN, TIBC, IRON, RETICCTPCT in the last 72 hours. Urine analysis:    Component Value Date/Time   COLORURINE AMBER (A) 07/08/2024 1410   APPEARANCEUR TURBID (A) 07/08/2024 1410   APPEARANCEUR Cloudy (A) 05/13/2024 1047   LABSPEC 1.009 07/08/2024 1410   LABSPEC 1.010 06/16/2024 1119   PHURINE 6.0 07/08/2024 1410   GLUCOSEU NEGATIVE 07/08/2024 1410   HGBUR MODERATE  (  A) 07/08/2024 1410   BILIRUBINUR NEGATIVE 07/08/2024 1410   BILIRUBINUR negative 06/16/2024 1119   BILIRUBINUR Negative 05/13/2024 1047   KETONESUR NEGATIVE 07/08/2024 1410   PROTEINUR 100 (A) 07/08/2024 1410   UROBILINOGEN negative (A) 10/28/2016 1044   NITRITE POSITIVE (A) 07/08/2024 1410   LEUKOCYTESUR LARGE (A) 07/08/2024 1410   Sepsis Labs: Invalid input(s): PROCALCITONIN, LACTICIDVEN   SIGNED:  Breyon Sigg T Erle Guster, MD  [facility] 07/11/2024, 3:00 PM   "

## 2024-07-11 NOTE — Progress Notes (Signed)
" °   07/11/24 1137  TOC Brief Assessment  Insurance and Status Reviewed  Patient has primary care physician Yes  Home environment has been reviewed home with self  Prior level of function: indepednent  Prior/Current Home Services No current home services  Social Drivers of Health Review SDOH reviewed no interventions necessary  Readmission risk has been reviewed Yes  Transition of care needs no transition of care needs at this time    "

## 2024-07-12 ENCOUNTER — Ambulatory Visit: Payer: Self-pay | Admitting: Medical

## 2024-07-12 ENCOUNTER — Telehealth: Payer: Self-pay

## 2024-07-12 NOTE — Progress Notes (Signed)
 Blood cultures showing no blood stream infection, final results not done

## 2024-07-12 NOTE — Transitions of Care (Post Inpatient/ED Visit) (Signed)
" ° °  07/12/2024  Name: Patrick Baxter MRN: [id number] DOB: [birth year]  Today's TOC FU Call Status: Today's TOC FU Call Status:: Successful TOC FU Call Completed TOC FU Call Complete Date: 07/12/24  Patient's Name and Date of Birth confirmed. Name, DOB  Situation: Admission for Urosepsis, s/p ureteral stents placed earlier admission 06/2025, and planned procedure for this issues on 08/01/2024.   Transition Care Management Follow-up Telephone Call Date of Discharge: 07/11/24 Discharge Facility: [facility]) Type of Discharge: Inpatient Admission Primary Inpatient Discharge Diagnosis:: Septic shock 2/2 to E.coli Urosepsis How have you been since you were released from the hospital?: Better Any questions or concerns?: No  Items Reviewed: Did you receive and understand the discharge instructions provided?: Yes Dietary orders reviewed?: Yes Type of Diet Ordered:: Carb modified Do you have support at home?: Yes People in Home [RPT]: parent(s) Name of Support/Comfort Primary Source: Chard,Joe  Father, Emergency Contact  [phone] (Home Phone)  Medications Reviewed Today: Reviewed antibiotics (cefadroxil  (DURICEF) nitrofurantoin  (MACRODANTIN ) patient was prescribed and received prior to discharge from [facility]/Transition of Care Pharmacy. Declined rest of review stated that was the only change in addition to stopping 2 medications listed on AVS summary (Aleve  and metoprolol ).  Medications Reviewed Today   Medications were not reviewed in this encounter    Assessment was brief to to patient fatigue on call.   Home Care and Equipment/Supplies: Were Home Health Services Ordered?: No Any new equipment or medical supplies ordered?: No  Functional Questionnaire: Do you need assistance with bathing/showering or dressing?: No Do you need assistance with meal preparation?: No Do you need assistance with eating?: No Do you have difficulty maintaining continence: No Do you  need assistance with getting out of bed/getting out of a chair/moving?: No Do you have difficulty managing or taking your medications?: No  Follow up appointments reviewed: PCP Follow-up appointment confirmed?: No (Waiting on return call from PCP office for HFU.) Specialist Hospital Follow-up appointment confirmed?: Yes Date of Specialist follow-up appointment?: 08/01/24 Follow-Up Specialty Provider:: [facility] at [facility] for procedure Do you need transportation to your follow-up appointment?: No Do you understand care options if your condition(s) worsen?: Yes-patient verbalized understanding  SDOH Interventions Today    Flowsheet Row Most Recent Value  SDOH Interventions   Food Insecurity Interventions Intervention Not Indicated  Housing Interventions Intervention Not Indicated  Transportation Interventions Intervention Not Indicated, Patient Resources (Friends/Family)  Utilities Interventions Intervention Not Indicated   07/12/24: Successful, though brief, post discharge outreach call with patient, patient stated he was feeling better but weak and fatigued.  Last post discharge outreach completed on prior discharge a few weeks ago and patient declined 30 Day program.  He declined today as well and review/outreach data/assessment was very brief due to patient fatigue.  He did stated he understood discharge instructions, medications changes, follow up appointments, no changes to SDOH, live with parents. Patient waiting on return call from PCP for HFU appointment.  No needs or questions expressed.  Unable to review benefits or care gaps due to patient declining much time on call.     Bing Edison MSN, RN RN [employer] [facility]  [facility] Office Hours M-F [phone] Direct Dial: [phone] Main Phone [phone]  Fax: [phone] [email or web address]    "

## 2024-07-13 LAB — CULTURE, BLOOD (ROUTINE X 2)
Culture: NO GROWTH
Culture: NO GROWTH
Special Requests: ADEQUATE
Special Requests: ADEQUATE

## 2024-07-18 ENCOUNTER — Ambulatory Visit: Admitting: Urology

## 2024-07-18 ENCOUNTER — Other Ambulatory Visit

## 2024-07-20 ENCOUNTER — Other Ambulatory Visit

## 2024-07-20 DIAGNOSIS — N201 Calculus of ureter: Secondary | ICD-10-CM

## 2024-07-20 LAB — URINALYSIS, COMPLETE
Bilirubin, UA: NEGATIVE
Glucose, UA: NEGATIVE
Ketones, UA: NEGATIVE
Nitrite, UA: NEGATIVE
Specific Gravity, UA: 1.03 (ref 1.005–1.030)
Urobilinogen, Ur: 0.2 mg/dL (ref 0.2–1.0)
pH, UA: 6 (ref 5.0–7.5)

## 2024-07-20 LAB — MICROSCOPIC EXAMINATION
RBC, Urine: >30 /HPF — AB (ref 0–2)
WBC, UA: >30 /HPF — AB (ref 0–5)

## 2024-07-25 ENCOUNTER — Encounter
Admission: RE | Admit: 2024-07-25 | Discharge: 2024-07-25 | Disposition: A | Discharge status: Home / Self Care | Source: Ambulatory Visit | Attending: Urology | Admitting: Urology

## 2024-07-25 ENCOUNTER — Other Ambulatory Visit: Payer: Self-pay

## 2024-07-25 NOTE — Patient Instructions (Signed)
 Your procedure is scheduled on:  MONDAY JANUARY 19  Report to the Registration Desk on the 1st floor of the [facility] To find out your arrival time, please call [phone] between 1PM - 3PM on:   FRIDAY JANUARY 16  If your arrival time is 6:00 am, do not arrive before that time as the [facility] entrance doors do not open until 6:00 am.  REMEMBER: Instructions that are not followed completely may result in serious medical risk, up to and including death; or upon the discretion of your surgeon and anesthesiologist your surgery may need to be rescheduled.  Do not eat food after midnight the night before surgery.  No gum chewing or hard candies.   One week prior to surgery: MONDAY JANUARY 12  Stop Anti-inflammatories (NSAIDS) such as Advil , Aleve , Ibuprofen , Motrin , Naproxen , Naprosyn  and Aspirin based products such as Excedrin, Goody's Powder, BC Powder. Stop ANY OVER THE COUNTER supplements until after surgery. cetirizine (ZYRTEC  cholecalciferol (VITAMIN D3)  Multiple Vitamins-Minerals   You may however, continue to take Tylenol  if needed for pain up until the day of surgery.  **Follow guidelines for insulin  and diabetes medications.** RYBELSUS  hold for 7 days last dose SUNDAY JANUARY 11   Continue taking all of your other prescription medications up until the day of surgery.  ON THE DAY OF SURGERY ONLY TAKE THESE MEDICATIONS WITH SIPS OF WATER :  levothyroxine  (SYNTHROID )   Use inhalers on the day of surgery and bring to the hospital. albuterol  (VENTOLIN  HFA)   No Alcohol for 24 hours before or after surgery.  Do not use any recreational drugs for at least a week (preferably 2 weeks) before your surgery.  Please be advised that the combination of cocaine and anesthesia may have negative outcomes, up to and including death. If you test positive for cocaine, your surgery will be cancelled.  On the morning of surgery brush your teeth with toothpaste and water , you may  rinse your mouth with mouthwash if you wish. Do not swallow any toothpaste or mouthwash.  Do not wear jewelry, make-up, hairpins, clips or nail polish.  For welded (permanent) jewelry: bracelets, anklets, waist bands, etc.  Please have this removed prior to surgery.  If it is not removed, there is a chance that hospital personnel will need to cut it off on the day of surgery.  Do not wear lotions, powders, or perfumes.   Do not shave body hair from the neck down 48 hours before surgery.  Do not bring valuables to the hospital. [facility] is not responsible for any missing/lost belongings or valuables.   Notify your doctor if there is any change in your medical condition (cold, fever, infection).  Wear comfortable clothing (specific to your surgery type) to the hospital.  After surgery, you can help prevent lung complications by doing breathing exercises.  Take deep breaths and cough every 1-2 hours.   If you are being discharged the day of surgery, you will not be allowed to drive home. You will need a responsible individual to drive you home and stay with you for 24 hours after surgery.   If you are taking public transportation, you will need to have a responsible individual with you.  Please call the Pre-admissions Testing Dept. at [phone] if you have any questions about these instructions.  Surgery Visitation Policy:  Patients having surgery or a procedure may have two visitors.  Children under the age of 8 must have an adult with them who  is not the patient.  Merchandiser, Retail to address health-related social needs:  [email or web address]

## 2024-07-26 ENCOUNTER — Ambulatory Visit: Payer: Self-pay

## 2024-07-26 LAB — CULTURE, URINE COMPREHENSIVE

## 2024-07-26 NOTE — Telephone Encounter (Signed)
 FYI Only or Action Required?: FYI only for provider: appointment scheduled on virtual 07/27/24.  Patient was last seen in primary care on 07/08/2024 by Bulah Alm RAMAN, PA-C.  Called Nurse Triage reporting Blurred Vision and Dizziness.  Symptoms began 07/08/24.  Interventions attempted: Rest, hydration, and elevating legs.  Symptoms are: unchanged.  Triage Disposition: See Physician Within 24 Hours  Patient/caregiver understands and will follow disposition?: Yes    Reason for Disposition  [1] MODERATE dizziness (e.g., interferes with normal activities) AND [2] has NOT been evaluated by doctor (or NP/PA) for this  (Exception: Dizziness caused by heat exposure, sudden standing, or poor fluid intake.)  Answer Assessment - Initial Assessment Questions Requesting video visit for intermittent lightheadedness and blurry vision occurring daily since in patient December 2025.  Scheduled video visit on 07/27/24, patient aware he may be asked to come to clinic for in person evaluation and/or labs and is agreeable.    1. DESCRIPTION: Describe your dizziness.     Lightheaded intermittently since hospital in December.  2. LIGHTHEADED: Do you feel lightheaded? (e.g., somewhat faint, woozy, weak upon standing)     Somewhat faint  3. VERTIGO: Do you feel like either you or the room is spinning or tilting? (i.e., vertigo)     Denies  4. SEVERITY: How bad is it?  Do you feel like you are going to faint? Can you stand and walk?     One episode where he felt faint.  5. ONSET:  When did the dizziness begin?     Intermittent  6. AGGRAVATING FACTORS: Does anything make it worse? (e.g., standing, change in head position)     Nothing identified 7. HEART RATE: Can you tell me your heart rate? How many beats in 15 seconds?  (Note: Not all patients can do this.)       Seems normal. Does not check vital signs  8. CAUSE: What do you think is causing the dizziness? (e.g., decreased  fluids or food, diarrhea, emotional distress, heat exposure, new medicine, sudden standing, vomiting; unknown)     Blood pressure 9. RECURRENT SYMPTOM: Have you had dizziness before? If Yes, ask: When was the last time? What happened that time?     Daily since December  10. OTHER SYMPTOMS: Do you have any other symptoms? (e.g., fever, chest pain, vomiting, diarrhea, bleeding)      Vision blurry when dizzy  Protocols used: Dizziness - Lightheadedness-A-AH Copied from CRM [id number]. Topic: Clinical - Red Word Triage >> Jul 26, 2024  3:46 PM [name] wrote: Red Word that prompted transfer to Nurse Triage: Pt reports thinks he is having blood pressure issues, due to previous diagnosis of sepsis, from UTI.   Pt reports symptoms of increased blurry vision, light headedness, and starry vision.  Pt requesting appt for evaluation.

## 2024-07-26 NOTE — Progress Notes (Signed)
 Reached out to Dr Myra via secure chat regarding recent hospitalization after recent kidney stone surgery in December. Pt had surgery and was discharged home. Pt became hypotensive and was admitted to ICU. Pt now requiring stent exchange on 08-01-24. Dr Myra requesting medical clearance. Clearance faxed to Alm Gent PA with fax confirmation received

## 2024-07-27 ENCOUNTER — Ambulatory Visit: Payer: Self-pay | Admitting: Urology

## 2024-07-27 ENCOUNTER — Telehealth (INDEPENDENT_AMBULATORY_CARE_PROVIDER_SITE_OTHER): Admitting: Family Medicine

## 2024-07-27 DIAGNOSIS — I9589 Other hypotension: Secondary | ICD-10-CM

## 2024-07-27 NOTE — Progress Notes (Signed)
" ° °  Name: Patrick Baxter   Date of Visit: 07/27/2024   Date of last visit with me: Visit date not found   Telehealth visit conducted via video.  Both patient and physician are located in the state of [place] . CHIEF COMPLAINT:  Chief Complaint  Patient presents with   Acute Visit    Intermittent dizziness.        HPI:  Discussed the use of AI scribe software for clinical note transcription with the patient, who gave verbal consent to proceed.  History of Present Illness The patient presents with episodes of lightheadedness and visual disturbances following a recent hospital visit for low blood pressure.  He experiences episodes of lightheadedness, blurry and dark vision, and seeing stars, which began on Saturday and have persisted over the last few days. He is concerned these symptoms may be related to his blood pressure dropping again, as it did during his last hospital visit. He has not checked his blood pressure at home due to the lack of a blood pressure monitor.  He is currently taking lisinopril  and was previously on metoprolol , which was stopped by a hospital doctor. He recalls being prescribed metoprolol  due to a history of elevated troponin and potential heart issues, although his heart was found to be functioning normally. His heart rate tends to run in the eighties to low nineties.  He expresses concern about recurrent sepsis despite having stents placed and multiple rounds of antibiotics. He is worried about the possibility of infection persisting even after stone blasting.     OBJECTIVE:       06/29/2024   11:21 AM  Depression screen PHQ 2/9  Decreased Interest 1  Down, Depressed, Hopeless 1  PHQ - 2 Score 2  Altered sleeping 1  Tired, decreased energy 1  Change in appetite 1  Feeling bad or failure about yourself  0  Trouble concentrating 0  Suicidal thoughts 0  PHQ-9 Score 5  Difficult doing work/chores Not difficult at all     BP Readings from Last  3 Encounters:  07/11/24 137/66  07/08/24 (!) 70/40  06/28/24 133/78    There were no vitals taken for this visit.   Physical Exam    Physical Exam Constitutional:      General: He is not in acute distress.    Appearance: Normal appearance. He is not toxic-appearing.  Neurological:     Mental Status: He is alert.     ASSESSMENT/PLAN:   Assessment & Plan    Assessment and Plan Assessment & Plan Hypotension Episodes of lightheadedness, blurry vision, and seeing stars suggest hypotension. Symptoms may be related to blood pressure medication. - Check blood pressure at the office today. - Withhold blood pressure medication if blood pressure is low. - [employer] message with blood pressure reading for further instructions. - Obtain a home blood pressure monitor for ongoing monitoring.  History of myocardial infarction Previously on metoprolol  for cardiac remodeling. Metoprolol  discontinued. Heart rate in the 80s to low 90s, acceptable. Focus on managing blood pressure and hypotension symptoms. - Withhold metoprolol  for now.     Abrielle Finck A. Vita MD [facility] "

## 2024-07-28 NOTE — Telephone Encounter (Signed)
 We received request for surgery clearance for kidney stone procedure.  He had a televisit with Dr. Vita the other day, but we will need a visit to document  blood pressure in the office, orthostatics and possibly recheck any necessary labs  I first want to make sure he is feeling much improved before we bring him back again so we do not waste his time  How is he feeling now?  Has he checked blood pressure at home in the last few days?  What kind of numbers as he is seeing with blood pressure and blood sugar in the last few days

## 2024-07-29 ENCOUNTER — Telehealth: Payer: Self-pay | Admitting: Internal Medicine

## 2024-07-29 ENCOUNTER — Telehealth: Payer: Self-pay | Admitting: Medical

## 2024-07-29 ENCOUNTER — Ambulatory Visit: Admitting: Nurse Practitioner

## 2024-07-29 ENCOUNTER — Encounter: Payer: Self-pay | Admitting: Nurse Practitioner

## 2024-07-29 ENCOUNTER — Telehealth: Payer: Self-pay

## 2024-07-29 VITALS — BP 160/74 | HR 118 | Wt >= 6400 oz

## 2024-07-29 DIAGNOSIS — Z6841 Body Mass Index (BMI) 40.0 and over, adult: Secondary | ICD-10-CM

## 2024-07-29 DIAGNOSIS — E1165 Type 2 diabetes mellitus with hyperglycemia: Secondary | ICD-10-CM | POA: Diagnosis not present

## 2024-07-29 DIAGNOSIS — N179 Acute kidney failure, unspecified: Secondary | ICD-10-CM | POA: Diagnosis not present

## 2024-07-29 DIAGNOSIS — H60332 Swimmer's ear, left ear: Secondary | ICD-10-CM

## 2024-07-29 DIAGNOSIS — Z01818 Encounter for other preprocedural examination: Secondary | ICD-10-CM

## 2024-07-29 DIAGNOSIS — E039 Hypothyroidism, unspecified: Secondary | ICD-10-CM

## 2024-07-29 DIAGNOSIS — R972 Elevated prostate specific antigen [PSA]: Secondary | ICD-10-CM | POA: Insufficient documentation

## 2024-07-29 DIAGNOSIS — I10 Essential (primary) hypertension: Secondary | ICD-10-CM | POA: Diagnosis not present

## 2024-07-29 MED ORDER — HYDROCORTISONE-ACETIC ACID 1-2 % OT SOLN: 3.0000 [drp] | Freq: Four times a day (QID) | OTIC | 3 refills | Status: AC

## 2024-07-29 NOTE — Telephone Encounter (Signed)
 Dr. Michele  Our mutual patient is supposed to have urological procedure on Monday, January 19 for bilateral ureteroscopic with laser lithotripsy and stent exchange  He was recently in the hospital twice, most recently for sepsis.  He supposedly is feeling better at this point.  We are trying to get him in today to recheck vital signs to see if we can go ahead and get him ready to do this procedure Monday.  From your standpoint based on his last echocardiogram, do you have any reservations on him moving forward Monday with this needed procedure?  He is temporarily off blood pressure medicine given ongoing hypotension since his recent hospital visits.  He is supposed to come in today to see my colleague Catheline as I leave half day today.  I have copied her in the note

## 2024-07-29 NOTE — Telephone Encounter (Signed)
 Left message for pt to call me back. Will send a mychart message as well.

## 2024-07-29 NOTE — Telephone Encounter (Signed)
 Ludie,  I have not seen him since 2022.  LVEF was preserved per report.  His last echo was in June 2025 and cardiology saw him during the hospital due to RV pressure elevated it likely due to his respiratory status that time and underlying sleep apnea.  Will defer clearance to your team.   Regards,   Dr. Michele

## 2024-07-29 NOTE — Patient Instructions (Addendum)
 Restart the lisinopril  at 1/2 dose tomorrow and Sunday. Hold the dose on Monday prior to surgery.  If BP drops at any time, stop the dose.   You can restart the Lisinopril  at full dose on Tuesday as long as you were able to tolerate the 1/2 dose without issues.  Goal BP 120/80.   If BP drops after restarting the full dose on Tuesday, stop taking and contact the office.    I don't see any reason that you cannot proceed with surgery as planned. I will send the information to the surgeon.    I would like you to come back for blood pressure check with the nurse only in a week.   I sent the ear drops in for you.

## 2024-07-29 NOTE — Assessment & Plan Note (Signed)
 Scheduled for surgical procedure to remove kidney stones on Monday. Previous episodes of septic shock due to kidney infections last month and hypotension 2 weeks ago. Blood pressure today is 160/74 mmHg not on any medications- currently holding lisinopril  and metoprolol . No current fever, chills, body aches, weakness, dizziness, or presyncope. No CP or shortness of breath.  Hemoglobin low due to hematuria, but no clotting in urine. No anticoagulation therapy. Will obtain labs today. Recommend 1/2 dose of lisinopril  (10mg ) tomorrow and Sunday. Discontinue if blood pressure drops. Continue to hold metoprolol . At this time there are no clear contraindications for surgery. His exam is benign with exception of evidence of swimmers ear, which we will treat.  - Proceed with surgical clearance. - Monitor blood pressure and hydration status. - Start lisinopril  tomorrow and Sunday at 1/2 dose. If BP is higher than 130/85- ok to resume full dose on Tuesday if approved by urologist. Cut dose back down if BP drops.  - Return in 1 week for BP check with CMA.  - Ensure all medications are resumed post-procedure as per urologist's instructions.

## 2024-07-29 NOTE — Assessment & Plan Note (Signed)
 Morbid obesity with recent weight loss of approximately 25 pounds. Previous use of Rybelsus  for weight management. - Encouraged continued weight management efforts. Orders:   Hemoglobin A1c   CBC with Differential/Platelet   Comprehensive metabolic panel with GFR

## 2024-07-29 NOTE — Telephone Encounter (Signed)
 Copied from CRM [id number]. Topic: General - Other >> Jul 29, 2024 10:54 AM Wess RAMAN wrote: Reason for CRM: Patient missed call from Vicci Samule LABOR, CMA and would like a callback   Callback #: [phone]

## 2024-07-29 NOTE — Assessment & Plan Note (Signed)
 BP medications currently held due to previous episode of hypotension. Blood pressure 160/74 mmHg in the office today. We discussed higher BP is more manageable than lower. Recommend restart Lisinopril  at 10mg  (1/2 dose) tomorrow and Sunday. Stop if BP drops or symptoms start. If tolerating 10mg  well, ok to restart at 20mg  following surgery on Tuesday.  - Resume lisinopril  at half dose on Sunday and full dose on Tuesday, as tolerated. - Monitor blood pressure at home if possible. - Return in 1 week for BP check with CMA.  Orders:   Hemoglobin A1c   CBC with Differential/Platelet   Comprehensive metabolic panel with GFR

## 2024-07-29 NOTE — Progress Notes (Signed)
 "  Camie FORBES Doing, DNP, AGNP-c [facility] [address] [place], [place] [address] [phone]   ACUTE VISIT : Acute or New Concern Visit on 07/29/2024  Blood pressure (!) 160/74, pulse (!) 118, weight (!) 404 lb 3.2 oz (183.3 kg), SpO2 96%.   Subjective:  Follow-up (Surgery clearance surgery scheduled for Monday. )   History of Present Illness Patrick Baxter is a 54 year old male with recurrent urinary tract infections and kidney stones who presents for surgical clearance for cystoscopy/ureteroscopy scheduled for Monday 08/01/2024.  He has been experiencing recurrent urinary tract infections (UTIs) for the past year and a half, which have recently worsened. Initially, he did not experience typical symptoms such as dysuria, but in the last few months, he developed severe symptoms including flank pain and infections, leading to septic shock on two occasions.  His most recent hospitalization was 07/08/2024. He experienced severe hypotension (BP 70/40) in the office and was sent to the ED for evaluation, resulting in admission for urosepsis.   He restarted lisinopril  and metoprolol  following his hospitalization, but on 07/27/2024, he experienced repeat symptoms of hypotension and was instructed to stop the metoprolol . He has also held the lisinopril . Today his BP is 160/74. He denies symptoms of CP, ShOB, dizziness, vision changes.   He has also been dealing with ear issues, particularly in his left ear, where he has experienced previous infections and vertigo. He has been prescribed meclizine  for vertigo and has noted some hearing loss in the affected ear. He has some fullness in the left ear with no pain, dizziness, fever, or chills at this time.   He has a history of diabetes, previously managed with Rybelsus , and has achieved a weight loss of approximately 25 pounds. His last recorded A1c was 6.2, indicating good glycemic control. He has stopped his Rybelsus  for the  pending surgical procedure. He has been hydrating well.   He is currently experiencing hematuria, but spoke to the urology office this morning and they told him this was ok as long as he was not experiencing clots.    He has discussed the challenges of finding appropriate-sized blood pressure cuffs.   ROS negative except for what is listed in HPI. History, Medications, Surgery, SDOH, and Family History reviewed and updated as appropriate.  Objective:  Physical Exam Vitals and nursing note reviewed.  Constitutional:      General: He is not in acute distress.    Appearance: Normal appearance. He is obese. He is not ill-appearing or toxic-appearing.  HENT:     Head: Normocephalic.     Right Ear: Tympanic membrane normal.     Ears:     Comments: White exudate noted in the canal consistent with otitis externa. TM intact    Mouth/Throat:     Mouth: Mucous membranes are moist.     Pharynx: Oropharynx is clear. No oropharyngeal exudate or posterior oropharyngeal erythema.  Eyes:     General: No scleral icterus.    Conjunctiva/sclera: Conjunctivae normal.     Pupils: Pupils are equal, round, and reactive to light.  Cardiovascular:     Rate and Rhythm: Normal rate and regular rhythm.     Pulses: Normal pulses.     Heart sounds: Normal heart sounds.  Pulmonary:     Effort: Pulmonary effort is normal.     Breath sounds: Normal breath sounds. No wheezing or rhonchi.  Abdominal:     General: Bowel sounds are normal.     Palpations: Abdomen  is soft.  Musculoskeletal:        General: Normal range of motion.     Cervical back: Normal range of motion. No tenderness.  Lymphadenopathy:     Cervical: No cervical adenopathy.  Skin:    General: Skin is warm and dry.     Capillary Refill: Capillary refill takes less than 2 seconds.  Neurological:     General: No focal deficit present.     Mental Status: He is alert and oriented to person, place, and time.  Psychiatric:        Mood and  Affect: Mood normal.         Assessment & Plan:   Assessment & Plan Preoperative examination Scheduled for surgical procedure to remove kidney stones on Monday. Previous episodes of septic shock due to kidney infections last month and hypotension 2 weeks ago. Blood pressure today is 160/74 mmHg not on any medications- currently holding lisinopril  and metoprolol . No current fever, chills, body aches, weakness, dizziness, or presyncope. No CP or shortness of breath.  Hemoglobin low due to hematuria, but no clotting in urine. No anticoagulation therapy. Will obtain labs today. Recommend 1/2 dose of lisinopril  (10mg ) tomorrow and Sunday. Discontinue if blood pressure drops. Continue to hold metoprolol . At this time there are no clear contraindications for surgery. His exam is benign with exception of evidence of swimmers ear, which we will treat.  - Proceed with surgical clearance. - Monitor blood pressure and hydration status. - Start lisinopril  tomorrow and Sunday at 1/2 dose. If BP is higher than 130/85- ok to resume full dose on Tuesday if approved by urologist. Cut dose back down if BP drops.  - Return in 1 week for BP check with CMA.  - Ensure all medications are resumed post-procedure as per urologist's instructions.    Acute swimmer's ear of left side Mild otitis externa in the left ear, likely due to water  retention and possible fungal growth. No severe symptoms or infection. Hearing loss noted in the left ear, possibly related to previous ear issues. No contraindication for urological procedure while on treatment for this.  - Prescribed ear drops for otitis externa.  Orders:   acetic acid -hydrocortisone  (VOSOL -HC) OTIC solution; Place 3 drops into the left ear 4 (four) times daily.  Type 2 diabetes mellitus with hyperglycemia, without long-term current use of insulin  (HCC) Type 2 diabetes with previous A1c of 6.2%. Recent hospitalization may have elevated blood sugars due to steroid  use. Currently not on Rybelsus  due to perioperative instructions. No current anticoagulation therapy. If needed, sliding scale novolog  can be utilized to maintain glycemic control while off of Rybelsus . He does not check BG at home per endocrinology, but no symptoms of severe hyperglycemia. Will monitor labs today.  - Monitor blood glucose levels perioperatively. - Can consider sliding scale novolog  for perioperative BG control. (sliding scale included on surgical clearance form).  - Resume Rybelsus  post-procedure as per urologist's instructions. Orders:   Hemoglobin A1c   CBC with Differential/Platelet   Comprehensive metabolic panel with GFR  Acquired hypothyroidism Managed with Synthroid . - Continue Synthroid  as prescribed. Orders:   Hemoglobin A1c   CBC with Differential/Platelet   Comprehensive metabolic panel with GFR  Essential hypertension BP medications currently held due to previous episode of hypotension. Blood pressure 160/74 mmHg in the office today. We discussed higher BP is more manageable than lower. Recommend restart Lisinopril  at 10mg  (1/2 dose) tomorrow and Sunday. Stop if BP drops or symptoms start. If tolerating 10mg  well,  ok to restart at 20mg  following surgery on Tuesday.  - Resume lisinopril  at half dose on Sunday and full dose on Tuesday, as tolerated. - Monitor blood pressure at home if possible. - Return in 1 week for BP check with CMA.  Orders:   Hemoglobin A1c   CBC with Differential/Platelet   Comprehensive metabolic panel with GFR  BMI 60.0-69.9, adult (HCC) Morbid obesity with recent weight loss of approximately 25 pounds. Previous use of Rybelsus  for weight management. - Encouraged continued weight management efforts. Orders:   Hemoglobin A1c   CBC with Differential/Platelet   Comprehensive metabolic panel with GFR  AKI (acute kidney injury) Acute kidney injury secondary to kidney infections. Current kidney function to be re-evaluated  preoperatively. Labs pending.  - Rechecked kidney function preoperatively. Orders:   Hemoglobin A1c   CBC with Differential/Platelet   Comprehensive metabolic panel with GFR    Camie FORBES Doing, DNP, AGNP-c       "

## 2024-07-29 NOTE — Telephone Encounter (Signed)
 Patient called in, states that he has been noticing increased blood in urine, denies clots. Explained that blood can be normal in the presence of a stent. Encouraged to call back with any new or worsening symptoms. Encouraged to hydrate.

## 2024-07-29 NOTE — Telephone Encounter (Signed)
 See mychart message. Waiting for Ludie to reply

## 2024-07-29 NOTE — Assessment & Plan Note (Signed)
 Type 2 diabetes with previous A1c of 6.2%. Recent hospitalization may have elevated blood sugars due to steroid use. Currently not on Rybelsus  due to perioperative instructions. No current anticoagulation therapy. If needed, sliding scale novolog  can be utilized to maintain glycemic control while off of Rybelsus . He does not check BG at home per endocrinology, but no symptoms of severe hyperglycemia. Will monitor labs today.  - Monitor blood glucose levels perioperatively. - Can consider sliding scale novolog  for perioperative BG control. (sliding scale included on surgical clearance form).  - Resume Rybelsus  post-procedure as per urologist's instructions. Orders:   Hemoglobin A1c   CBC with Differential/Platelet   Comprehensive metabolic panel with GFR

## 2024-07-29 NOTE — Assessment & Plan Note (Signed)
 Acute kidney injury secondary to kidney infections. Current kidney function to be re-evaluated preoperatively. Labs pending.  - Rechecked kidney function preoperatively. Orders:   Hemoglobin A1c   CBC with Differential/Platelet   Comprehensive metabolic panel with GFR

## 2024-07-29 NOTE — Telephone Encounter (Signed)
 Pt is coming in at 2:30pm today to see Catheline

## 2024-07-29 NOTE — Assessment & Plan Note (Signed)
 Managed with Synthroid . - Continue Synthroid  as prescribed. Orders:   Hemoglobin A1c   CBC with Differential/Platelet   Comprehensive metabolic panel with GFR

## 2024-07-29 NOTE — Telephone Encounter (Signed)
 Patrick Baxter

## 2024-07-30 LAB — COMPREHENSIVE METABOLIC PANEL WITH GFR
ALT: 30 IU/L (ref 0–44)
AST: 41 IU/L — ABNORMAL HIGH (ref 0–40)
Albumin: 3.7 g/dL — ABNORMAL LOW (ref 3.8–4.9)
Alkaline Phosphatase: 120 IU/L (ref 47–123)
BUN/Creatinine Ratio: 17 (ref 9–20)
BUN: 16 mg/dL (ref 6–24)
Bilirubin Total: 0.4 mg/dL (ref 0.0–1.2)
CO2: 20 mmol/L (ref 20–29)
Calcium: 9.1 mg/dL (ref 8.7–10.2)
Chloride: 99 mmol/L (ref 96–106)
Creatinine, Ser: 0.94 mg/dL (ref 0.76–1.27)
Globulin, Total: 2.8 g/dL (ref 1.5–4.5)
Glucose: 123 mg/dL — ABNORMAL HIGH (ref 70–99)
Potassium: 4.4 mmol/L (ref 3.5–5.2)
Sodium: 134 mmol/L (ref 134–144)
Total Protein: 6.5 g/dL (ref 6.0–8.5)
eGFR: 96 mL/min/1.73

## 2024-07-30 LAB — CBC WITH DIFFERENTIAL/PLATELET
Basophils Absolute: 0 x10E3/uL (ref 0.0–0.2)
Basos: 1 %
EOS (ABSOLUTE): 0.1 x10E3/uL (ref 0.0–0.4)
Eos: 3 %
Hematocrit: 37.3 % — ABNORMAL LOW (ref 37.5–51.0)
Hemoglobin: 12.3 g/dL — ABNORMAL LOW (ref 13.0–17.7)
Immature Grans (Abs): 0 x10E3/uL (ref 0.0–0.1)
Immature Granulocytes: 0 %
Lymphocytes Absolute: 0.9 x10E3/uL (ref 0.7–3.1)
Lymphs: 19 %
MCH: 30.3 pg (ref 26.6–33.0)
MCHC: 33 g/dL (ref 31.5–35.7)
MCV: 92 fL (ref 79–97)
Monocytes Absolute: 0.6 x10E3/uL (ref 0.1–0.9)
Monocytes: 12 %
Neutrophils Absolute: 3.3 x10E3/uL (ref 1.4–7.0)
Neutrophils: 65 %
Platelets: 134 x10E3/uL — ABNORMAL LOW (ref 150–450)
RBC: 4.06 x10E6/uL — ABNORMAL LOW (ref 4.14–5.80)
RDW: 14.5 % (ref 11.6–15.4)
WBC: 5 x10E3/uL (ref 3.4–10.8)

## 2024-07-30 LAB — HEMOGLOBIN A1C
Est. average glucose Bld gHb Est-mCnc: 114 mg/dL
Hgb A1c MFr Bld: 5.6 % (ref 4.8–5.6)

## 2024-08-01 ENCOUNTER — Other Ambulatory Visit: Payer: Self-pay

## 2024-08-01 ENCOUNTER — Encounter (HOSPITAL_COMMUNITY): Payer: Self-pay

## 2024-08-01 ENCOUNTER — Ambulatory Visit: Admission: RE | Admit: 2024-08-01 | Source: Home / Self Care | Admitting: Urology

## 2024-08-01 ENCOUNTER — Emergency Department (HOSPITAL_COMMUNITY)

## 2024-08-01 ENCOUNTER — Emergency Department (HOSPITAL_COMMUNITY)
Admission: EM | Admit: 2024-08-01 | Discharge: 2024-08-01 | Disposition: A | Discharge status: Home / Self Care | Source: Ambulatory Visit | Attending: Emergency Medicine | Admitting: Emergency Medicine

## 2024-08-01 ENCOUNTER — Encounter: Admission: RE | Payer: Self-pay | Source: Home / Self Care

## 2024-08-01 DIAGNOSIS — R059 Cough, unspecified: Secondary | ICD-10-CM | POA: Diagnosis not present

## 2024-08-01 DIAGNOSIS — R000 Tachycardia, unspecified: Secondary | ICD-10-CM | POA: Insufficient documentation

## 2024-08-01 DIAGNOSIS — N200 Calculus of kidney: Secondary | ICD-10-CM

## 2024-08-01 DIAGNOSIS — R0602 Shortness of breath: Secondary | ICD-10-CM | POA: Insufficient documentation

## 2024-08-01 DIAGNOSIS — R509 Fever, unspecified: Secondary | ICD-10-CM | POA: Diagnosis present

## 2024-08-01 DIAGNOSIS — Z79899 Other long term (current) drug therapy: Secondary | ICD-10-CM | POA: Insufficient documentation

## 2024-08-01 DIAGNOSIS — I10 Essential (primary) hypertension: Secondary | ICD-10-CM | POA: Insufficient documentation

## 2024-08-01 DIAGNOSIS — J069 Acute upper respiratory infection, unspecified: Secondary | ICD-10-CM

## 2024-08-01 LAB — URINALYSIS, W/ REFLEX TO CULTURE (INFECTION SUSPECTED)
Bacteria, UA: NONE SEEN
Bilirubin Urine: NEGATIVE
Glucose, UA: 50 mg/dL — AB
Ketones, ur: NEGATIVE mg/dL
Nitrite: NEGATIVE
Protein, ur: >=300 mg/dL — AB
RBC / HPF: >50 RBC/hpf (ref 0–5)
Specific Gravity, Urine: 1.017 (ref 1.005–1.030)
WBC, UA: >50 WBC/hpf (ref 0–5)
pH: 6 (ref 5.0–8.0)

## 2024-08-01 LAB — CBC WITH DIFFERENTIAL/PLATELET
Abs Immature Granulocytes: 0.04 K/uL (ref 0.00–0.07)
Basophils Absolute: 0.1 K/uL (ref 0.0–0.1)
Basophils Relative: 1 %
Eosinophils Absolute: 0.1 K/uL (ref 0.0–0.5)
Eosinophils Relative: 1 %
HCT: 37.8 % — ABNORMAL LOW (ref 39.0–52.0)
Hemoglobin: 12.2 g/dL — ABNORMAL LOW (ref 13.0–17.0)
Immature Granulocytes: 0 %
Lymphocytes Relative: 8 %
Lymphs Abs: 0.8 K/uL (ref 0.7–4.0)
MCH: 30.4 pg (ref 26.0–34.0)
MCHC: 32.3 g/dL (ref 30.0–36.0)
MCV: 94.3 fL (ref 80.0–100.0)
Monocytes Absolute: 0.9 K/uL (ref 0.1–1.0)
Monocytes Relative: 9 %
Neutro Abs: 7.5 K/uL (ref 1.7–7.7)
Neutrophils Relative %: 81 %
Platelets: 115 K/uL — ABNORMAL LOW (ref 150–400)
RBC: 4.01 MIL/uL — ABNORMAL LOW (ref 4.22–5.81)
RDW: 15.3 % (ref 11.5–15.5)
WBC: 9.3 K/uL (ref 4.0–10.5)
nRBC: 0 % (ref 0.0–0.2)

## 2024-08-01 LAB — RESP PANEL BY RT-PCR (RSV, FLU A&B, COVID)  RVPGX2
Influenza A by PCR: NEGATIVE
Influenza B by PCR: NEGATIVE
Resp Syncytial Virus by PCR: NEGATIVE
SARS Coronavirus 2 by RT PCR: NEGATIVE

## 2024-08-01 LAB — COMPREHENSIVE METABOLIC PANEL WITH GFR
ALT: 28 U/L (ref 0–44)
AST: 36 U/L (ref 15–41)
Albumin: 3.7 g/dL (ref 3.5–5.0)
Alkaline Phosphatase: 118 U/L (ref 38–126)
Anion gap: 11 (ref 5–15)
BUN: 13 mg/dL (ref 6–20)
CO2: 23 mmol/L (ref 22–32)
Calcium: 9.6 mg/dL (ref 8.9–10.3)
Chloride: 105 mmol/L (ref 98–111)
Creatinine, Ser: 0.98 mg/dL (ref 0.61–1.24)
GFR, Estimated: >60 mL/min
Glucose, Bld: 127 mg/dL — ABNORMAL HIGH (ref 70–99)
Potassium: 4.3 mmol/L (ref 3.5–5.1)
Sodium: 139 mmol/L (ref 135–145)
Total Bilirubin: 1 mg/dL (ref 0.0–1.2)
Total Protein: 7.4 g/dL (ref 6.5–8.1)

## 2024-08-01 LAB — PROTIME-INR
INR: 1.1 (ref 0.8–1.2)
Prothrombin Time: 15.1 s (ref 11.4–15.2)

## 2024-08-01 LAB — I-STAT CG4 LACTIC ACID, ED: Lactic Acid, Venous: 1.8 mmol/L (ref 0.5–1.9)

## 2024-08-01 SURGERY — CYSTOSCOPY/URETEROSCOPY/HOLMIUM LASER/STENT PLACEMENT
Anesthesia: General | Laterality: Bilateral

## 2024-08-01 MED ORDER — LACTATED RINGERS IV BOLUS
1000.0000 mL | Freq: Once | INTRAVENOUS | Status: AC
  Administered 2024-08-01: 1000 mL via INTRAVENOUS

## 2024-08-01 NOTE — ED Provider Triage Note (Signed)
 Emergency Medicine Provider Triage Evaluation Note  Patrick Baxter , a 54 y.o. male  was evaluated in triage.  Pt complains of flu like sxs, cough sob. Scheduled today for lithtripsy, unable to procede hx of recurrent sepsis due to obstructive uropathy. Patient also has stents in place. + exertional sob and wheezing   Review of Systems  Positive: Cough, fever Negative: Vomiting or flank pain   Physical Exam  BP (!) 157/72 (BP Location: Right Arm)   Pulse (!) 110   Temp 98.8 F (37.1 C) (Oral)   Resp (!) 28   SpO2 100%  Gen:   Awake, no distress   Resp:  Normal effort -cough, sepiratory wheeze MSK:   Moves extremities without difficulty  Other:    Medical Decision Making  Medically screening exam initiated at 12:17 PM.  Appropriate orders placed.  Cassey Hurrell was informed that the remainder of the evaluation will be completed by another provider, this initial triage assessment does not replace that evaluation, and the importance of remaining in the ED until their evaluation is complete.     Arloa Chroman, PA-C 08/01/24 1218

## 2024-08-01 NOTE — ED Triage Notes (Signed)
 Pt presents to ED from home C/O SOB, fever, chills, body aches X 1 day. Reports he was supposed to have procedure today to blast kidney stones, but called his surgeon who instructed him to come here d/t hx urosepsis. Took aleve , tylenol  prior to arrival. Tmax 101.7.

## 2024-08-01 NOTE — ED Provider Notes (Signed)
" °  Physical Exam  BP (!) 123/57   Pulse 96   Temp 98.8 F (37.1 C) (Oral)   Resp 18   SpO2 97%   Physical Exam Vitals and nursing note reviewed.  HENT:     Head: Normocephalic and atraumatic.  Eyes:     Pupils: Pupils are equal, round, and reactive to light.  Cardiovascular:     Rate and Rhythm: Normal rate and regular rhythm.  Pulmonary:     Effort: Pulmonary effort is normal.     Breath sounds: Normal breath sounds.  Abdominal:     Palpations: Abdomen is soft.     Tenderness: There is no abdominal tenderness.  Skin:    General: Skin is warm and dry.  Neurological:     Mental Status: He is alert.  Psychiatric:        Mood and Affect: Mood normal.     Procedures  Procedures  ED Course / MDM   Clinical Course as of 08/01/24 1641  Mon Aug 01, 2024  1622 CT shows ureteral stents in expected location and stable 7 mm calculus adjacent to the right stent.  No other acute findings.  Likely URI.  Patient feeling well enough to go home.  He will reschedule his lithotripsy with urology team.  Counseled him on symptomatic management and return precautions [MP]    Clinical Course User Index [MP] Pamella Ozell LABOR, DO   Medical Decision Making I, Ozell Pamella DO, have assumed care of this patient from the previous provider pending CT abdomen pelvis reevaluation and disposition.  Amount and/or Complexity of Data Reviewed Labs: ordered. Radiology: ordered.          Pamella Ozell LABOR, DO 08/01/24 1641  "

## 2024-08-01 NOTE — Discharge Instructions (Signed)
 You were seen in the Emergency Department for fevers CAT scan showed ureteral stents in place and stable 7 mm stone Rest of your lab work chest x-ray looked okay This is likely a viral respiratory illness Your COVID flu RSV were all negative Take Tylenol  as directed for fevers and discomfort and follow-up with urology team to reschedule your procedure Continue taking the antibiotic as previously prescribed Return to the emergency department for trouble breathing or other concerns

## 2024-08-01 NOTE — ED Provider Notes (Signed)
 " [place] EMERGENCY DEPARTMENT AT [facility] Provider Note   CSN: [id number] Arrival date & time: 08/01/24  1134     Patient presents with: Shortness of Breath   Patrick Baxter is a 54 y.o. male.   Patient is a 54 year old male with a history of hypertension, hyperlipidemia, bilateral renal stones with recurrent urosepsis with bilateral stents present who is presenting today with fever and upper respiratory symptoms.  Patient reports that he last had urosepsis at the end of December.  He was discharged home on a cephalosporin but had a urine culture that grew out E. coli resistant to everything except gentamicin and Macrobid .  Since 13 January he has been on Macrobid .  He has noticed in the last few days he has had more blood in his urine but reports last week he started getting nasal congestion and a cough.  He did have a minor sore throat and there were other people at work with similar symptoms.  However starting on Saturday he started to feel worse with production of cough of a little bit of mucus and then last night had a fever of 100.7.  He also started noticing some shortness of breath yesterday that is worse with exertion.  He denies any burning with urination or significant abdominal pain.  He was supposed to get lithotripsy today and after calling the urologist they told him to go to the emergency room due to the concern for possible recurrent urosepsis.  Patient did receive a flu shot this year.  He has not noticed any swelling or redness in his legs.  No diarrhea or vomiting.  The history is provided by the patient and medical records.  Shortness of Breath      Prior to Admission medications  Medication Sig Start Date End Date Taking? Authorizing Provider  acetaminophen  (TYLENOL ) 500 MG tablet Take 1,000 mg by mouth every 6 (six) hours as needed for mild pain (pain score 1-3) or fever.    [provider]  acetic acid -hydrocortisone  (VOSOL -HC) OTIC solution Place  3 drops into the left ear 4 (four) times daily. 07/29/24   Early, Sara E, NP  albuterol  (VENTOLIN  HFA) 108 (90 Base) MCG/ACT inhaler Inhale 2 puffs into the lungs every 6 (six) hours as needed for wheezing or shortness of breath. 12/01/22   Tysinger, Alm RAMAN, PA-C  allopurinol  (ZYLOPRIM ) 300 MG tablet TAKE 1 TABLET(300 MG) BY MOUTH DAILY 03/09/24   Tysinger, Alm RAMAN, PA-C  cetirizine (ZYRTEC) 10 MG tablet Take 10 mg by mouth daily as needed for allergies or rhinitis.    [provider]  cholecalciferol (VITAMIN D3) 25 MCG (1000 UNIT) tablet Take 1 tablet (1,000 Units total) by mouth daily. 12/01/22   Tysinger, Alm RAMAN, PA-C  clotrimazole -betamethasone  (LOTRISONE ) cream Apply 1 Application topically daily. Patient not taking: Reported on 07/29/2024 12/11/23   Tysinger, Alm RAMAN, PA-C  colchicine  0.6 MG tablet Take 1 tablet (0.6 mg total) by mouth daily as needed (for gout). Patient taking differently: Take 0.6 mg by mouth daily as needed (as directed for gout flares). 01/07/24   Cheryle Page, MD  cyclobenzaprine  (FLEXERIL ) 10 MG tablet Take 1 tablet (10 mg total) by mouth 3 (three) times daily as needed for muscle spasms. 01/01/24   Early, Sara E, NP  FLUoxetine  (PROZAC ) 40 MG capsule TAKE 1 CAPSULE(40 MG) BY MOUTH DAILY 06/29/24   Tysinger, Alm RAMAN, PA-C  gabapentin (NEURONTIN) 300 MG capsule Take 300 mg by mouth at bedtime.  [provider]  levothyroxine  (SYNTHROID ) 25 MCG tablet TAKE 1 TABLET BY MOUTH EVERY DAY BEFORE BREAKFAST 12/14/23   Tysinger, Alm RAMAN, PA-C  lisinopril  (ZESTRIL ) 20 MG tablet TAKE 1 TABLET(20 MG) BY MOUTH DAILY 03/15/24   Tysinger, Alm RAMAN, PA-C  montelukast  (SINGULAIR ) 10 MG tablet TAKE 1 TABLET(10 MG) BY MOUTH AT BEDTIME 01/04/24   Tysinger, Alm RAMAN, PA-C  Multiple Vitamins-Minerals (MULTIVITAMIN WITH MINERALS) tablet Take 1 tablet by mouth daily. 12/01/22   Tysinger, Alm RAMAN, PA-C  oxybutynin  (DITROPAN -XL) 10 MG 24 hr tablet Take 1 tablet (10 mg total) by mouth  daily. 06/29/24   Francisca Redell BROCKS, MD  RYBELSUS  14 MG TABS Take 14 mg by mouth in the morning. Patient not taking: Reported on 07/29/2024    [provider]  sodium chloride  (OCEAN) 0.65 % SOLN nasal spray Place 1 spray into both nostrils as needed for congestion.    [provider]  tamsulosin  (FLOMAX ) 0.4 MG CAPS capsule Take 0.4 mg by mouth daily.    [provider]    Allergies: Atorvastatin  and Pravastatin  sodium    Review of Systems  Respiratory:  Positive for shortness of breath.     Updated Vital Signs BP 119/67 (BP Location: Right Arm)   Pulse 100   Temp 98.8 F (37.1 C) (Oral)   Resp 18   SpO2 95%   Physical Exam Vitals and nursing note reviewed.  Constitutional:      General: He is not in acute distress.    Appearance: He is well-developed.  HENT:     Head: Normocephalic and atraumatic.  Eyes:     Conjunctiva/sclera: Conjunctivae normal.     Pupils: Pupils are equal, round, and reactive to light.  Cardiovascular:     Rate and Rhythm: Regular rhythm. Tachycardia present.     Heart sounds: No murmur heard. Pulmonary:     Effort: Pulmonary effort is normal. No respiratory distress.     Breath sounds: Normal breath sounds. No wheezing or rales.  Abdominal:     General: There is no distension.     Palpations: Abdomen is soft.     Tenderness: There is no abdominal tenderness. There is no right CVA tenderness, left CVA tenderness, guarding or rebound.  Musculoskeletal:        General: No tenderness. Normal range of motion.     Cervical back: Normal range of motion and neck supple.     Right lower leg: No edema.     Left lower leg: No edema.  Skin:    General: Skin is warm and dry.     Findings: No erythema or rash.  Neurological:     Mental Status: He is alert and oriented to person, place, and time. Mental status is at baseline.  Psychiatric:        Behavior: Behavior normal.     (all labs ordered are listed, but only abnormal  results are displayed) Labs Reviewed  URINALYSIS, W/ REFLEX TO CULTURE (INFECTION SUSPECTED) - Abnormal; Notable for the following components:      Result Value   Color, Urine RED (*)    APPearance CLOUDY (*)    Glucose, UA 50 (*)    Hgb urine dipstick LARGE (*)    Protein, ur >=300 (*)    Leukocytes,Ua LARGE (*)    Non Squamous Epithelial 0-5 (*)    All other components within normal limits  COMPREHENSIVE METABOLIC PANEL WITH GFR - Abnormal; Notable for the following components:  Glucose, Bld 127 (*)    All other components within normal limits  CBC WITH DIFFERENTIAL/PLATELET - Abnormal; Notable for the following components:   RBC 4.01 (*)    Hemoglobin 12.2 (*)    HCT 37.8 (*)    Platelets 115 (*)    All other components within normal limits  CULTURE, BLOOD (ROUTINE X 2)  CULTURE, BLOOD (ROUTINE X 2)  RESP PANEL BY RT-PCR (RSV, FLU A&B, COVID)  RVPGX2  URINE CULTURE  PROTIME-INR  I-STAT CG4 LACTIC ACID, ED    EKG: EKG Interpretation Date/Time:  Monday August 01 2024 11:59:45 EST Ventricular Rate:  123 PR Interval:  134 QRS Duration:  137 QT Interval:  347 QTC Calculation: 497 R Axis:   -58  Text Interpretation: Sinus tachycardia Right bundle branch block Borderline ST elevation, lateral leads Baseline wander in lead(s) I V1 V2 V6 No significant change since last tracing Confirmed by Doretha Folks ([id number]) on 08/01/2024 1:06:14 PM  Radiology: [facility] Chest 2 View Result Date: 08/01/2024 CLINICAL DATA:  Shortness of breath, fevers, chills and body aches. EXAM: CHEST - 2 VIEW COMPARISON:  07/08/2024 and CT chest 01/02/2024. FINDINGS: Trachea is midline. Heart size normal. Lungs are clear. No pleural fluid. Flowing anterior osteophytosis in the thoracic spine. Degenerative changes in the spine. IMPRESSION: No acute findings. Electronically Signed   By: Newell Eke M.D.   On: 08/01/2024 13:03     Procedures   Medications Ordered in the ED  lactated ringers  bolus  1,000 mL (1,000 mLs Intravenous New Bag/Given 08/01/24 1330)                                    Medical Decision Making Amount and/or Complexity of Data Reviewed Labs: ordered. Radiology: ordered.   Pt with multiple medical problems and comorbidities and presenting today with a complaint that caries a high risk for morbidity and mortality.  Here today with the above complaints.  Concern for viral etiology, flu, pneumonia, recurrent urosepsis.  Patient currently is hemodynamically stable.  I independently interpreted patient's labs and EKG.  EKG did show sinus tachycardia but no other acute findings, CBC with normal white count and stable hemoglobin of 12, CMP was normal, INR within normal limits, lactic acid within normal limits, UA with red and white cells with stents in place but no bacteria. I have independently visualized and interpreted pt's images today.  CXR wnl.      Final diagnoses:  None    ED Discharge Orders     None          Doretha Folks, MD 08/01/24 1547  "

## 2024-08-03 ENCOUNTER — Ambulatory Visit: Payer: Self-pay | Admitting: Nurse Practitioner

## 2024-08-03 LAB — URINE CULTURE: Culture: 20000 — AB

## 2024-08-04 ENCOUNTER — Telehealth: Payer: Self-pay

## 2024-08-04 ENCOUNTER — Telehealth (HOSPITAL_BASED_OUTPATIENT_CLINIC_OR_DEPARTMENT_OTHER): Payer: Self-pay | Admitting: *Deleted

## 2024-08-04 MED ORDER — CEPHALEXIN 500 MG PO CAPS: 500.0000 mg | ORAL_CAPSULE | Freq: Two times a day (BID) | ORAL | 0 refills | Status: DC

## 2024-08-04 NOTE — Telephone Encounter (Signed)
 Spoke with patient and Dr. Francisca, urology surgery rescheduled to 08/22/24. Per Dr. Francisca, he is on a low dose daily abx, lets do keflex  500mg  BID x 10 days starting now, then back to the low dose daily macrobid . Will clarify with patient if he needs additional medication sent to pharmacy for daily macrobid . Called patient and left voicemail to return my call.

## 2024-08-04 NOTE — Progress Notes (Signed)
 ED Antimicrobial Stewardship Positive Culture Follow Up   Patrick Baxter is an 54 y.o. male who presented to [facility] on 08/01/2024 with a chief complaint of  Chief Complaint  Patient presents with   Shortness of Breath    Recent Results (from the past 720 hours)  Culture, blood (Routine x 2)     Status: None   Collection Time: 07/08/24 11:52 AM   Specimen: BLOOD  Result Value Ref Range Status   Specimen Description   Final    BLOOD RIGHT ANTECUBITAL Performed at [facility], [address], [place], [place] [address]    Special Requests   Final    BOTTLES DRAWN AEROBIC AND ANAEROBIC Blood Culture adequate volume Performed at [facility], [address] [place], [place] [address]    Culture   Final    NO GROWTH 5 DAYS Performed at [facility], [address] [place], [place] [address]    Report Status 07/13/2024 FINAL  Final  Culture, blood (Routine x 2)     Status: None   Collection Time: 07/08/24 11:55 AM   Specimen: BLOOD LEFT HAND  Result Value Ref Range Status   Specimen Description   Final    BLOOD LEFT HAND Performed at [facility], [address] [place], [place] [address]    Special Requests   Final    BOTTLES DRAWN AEROBIC AND ANAEROBIC Blood Culture adequate volume Performed at [facility], [address], [place], [place] [address]    Culture   Final    NO GROWTH 5 DAYS Performed at [facility], [address], [place], [place] [address]    Report Status 07/13/2024 FINAL  Final  Resp panel by RT-PCR (RSV, Flu A&B, Covid) Anterior Nasal Swab     Status: None   Collection Time: 07/08/24  1:20 PM   Specimen: Anterior Nasal Swab  Result Value Ref Range Status   SARS Coronavirus 2 by RT PCR NEGATIVE NEGATIVE Final    Comment: (NOTE) SARS-CoV-2 target nucleic acids are NOT DETECTED.  The SARS-CoV-2 RNA is generally detectable in upper respiratory specimens during the acute  phase of infection. The lowest concentration of SARS-CoV-2 viral copies this assay can detect is 138 copies/mL. A negative result does not preclude SARS-Cov-2 infection and should not be used as the sole basis for treatment or other patient management decisions. A negative result may occur with  improper specimen collection/handling, submission of specimen other than nasopharyngeal swab, presence of viral mutation(s) within the areas targeted by this assay, and inadequate number of viral copies(<138 copies/mL). A negative result must be combined with clinical observations, patient history, and epidemiological information. The expected result is Negative.  Fact Sheet for Patients:  bloggercourse.com  Fact Sheet for Healthcare Providers:  seriousbroker.it  This test is no t yet approved or cleared by the [place]  FDA and  has been authorized for detection and/or diagnosis of SARS-CoV-2 by FDA under an Emergency Use Authorization (EUA). This EUA will remain  in effect (meaning this test can be used) for the duration of the COVID-19 declaration under Section 564(b)(1) of the Act, 21 U.S.C.section 360bbb-3(b)(1), unless the authorization is terminated  or revoked sooner.       Influenza A by PCR NEGATIVE NEGATIVE Final   Influenza B by PCR NEGATIVE NEGATIVE Final    Comment: (NOTE) The Xpert Xpress SARS-CoV-2/FLU/RSV plus assay is intended as an aid in the diagnosis of  influenza from Nasopharyngeal swab specimens and should not be used as a sole basis for treatment. Nasal washings and aspirates are unacceptable for Xpert Xpress SARS-CoV-2/FLU/RSV testing.  Fact Sheet for Patients: bloggercourse.com  Fact Sheet for Healthcare Providers: seriousbroker.it  This test is not yet approved or cleared by the [place]  FDA and has been authorized for detection and/or diagnosis of  SARS-CoV-2 by FDA under an Emergency Use Authorization (EUA). This EUA will remain in effect (meaning this test can be used) for the duration of the COVID-19 declaration under Section 564(b)(1) of the Act, 21 U.S.C. section 360bbb-3(b)(1), unless the authorization is terminated or revoked.     Resp Syncytial Virus by PCR NEGATIVE NEGATIVE Final    Comment: (NOTE) Fact Sheet for Patients: bloggercourse.com  Fact Sheet for Healthcare Providers: seriousbroker.it  This test is not yet approved or cleared by the [place]  FDA and has been authorized for detection and/or diagnosis of SARS-CoV-2 by FDA under an Emergency Use Authorization (EUA). This EUA will remain in effect (meaning this test can be used) for the duration of the COVID-19 declaration under Section 564(b)(1) of the Act, 21 U.S.C. section 360bbb-3(b)(1), unless the authorization is terminated or revoked.  Performed at [facility], [address], [place], [place] [address]   Urine Culture     Status: Abnormal   Collection Time: 07/08/24  2:10 PM   Specimen: Urine, Random  Result Value Ref Range Status   Specimen Description   Final    URINE, RANDOM Performed at [facility], [address], [place], [place] [address]    Special Requests   Final    NONE Reflexed from [id number] Performed at [facility], [address] [place], [place] [address]    Culture 10,000 COLONIES/mL ESCHERICHIA COLI (A)  Final   Report Status 07/11/2024 FINAL  Final   Organism ID, Bacteria ESCHERICHIA COLI (A)  Final      Susceptibility   Escherichia coli - MIC*    AMPICILLIN 4 SENSITIVE Sensitive     CEFAZOLIN (URINE) Value in next row Sensitive      <=1 SENSITIVEThis is a modified FDA-approved test that has been validated and its performance characteristics determined by the reporting laboratory.  This laboratory is certified  under the Clinical Laboratory Improvement Amendments CLIA as qualified to perform high complexity clinical laboratory testing.    CEFEPIME  Value in next row Sensitive      <=1 SENSITIVEThis is a modified FDA-approved test that has been validated and its performance characteristics determined by the reporting laboratory.  This laboratory is certified under the Clinical Laboratory Improvement Amendments CLIA as qualified to perform high complexity clinical laboratory testing.    ERTAPENEM Value in next row Sensitive      <=1 SENSITIVEThis is a modified FDA-approved test that has been validated and its performance characteristics determined by the reporting laboratory.  This laboratory is certified under the Clinical Laboratory Improvement Amendments CLIA as qualified to perform high complexity clinical laboratory testing.    CEFTRIAXONE  Value in next row Sensitive      <=1 SENSITIVEThis is a modified FDA-approved test that has been validated and its performance characteristics determined by the reporting laboratory.  This laboratory is certified under the Clinical Laboratory Improvement Amendments CLIA as qualified to perform high complexity clinical laboratory testing.    CIPROFLOXACIN  Value in next row Resistant      <=1 SENSITIVEThis is a modified FDA-approved test that has been validated and its  performance characteristics determined by the reporting laboratory.  This laboratory is certified under the Clinical Laboratory Improvement Amendments CLIA as qualified to perform high complexity clinical laboratory testing.    GENTAMICIN Value in next row Sensitive      <=1 SENSITIVEThis is a modified FDA-approved test that has been validated and its performance characteristics determined by the reporting laboratory.  This laboratory is certified under the Clinical Laboratory Improvement Amendments CLIA as qualified to perform high complexity clinical laboratory testing.    NITROFURANTOIN  Value in next row  Sensitive      <=1 SENSITIVEThis is a modified FDA-approved test that has been validated and its performance characteristics determined by the reporting laboratory.  This laboratory is certified under the Clinical Laboratory Improvement Amendments CLIA as qualified to perform high complexity clinical laboratory testing.    TRIMETH/SULFA Value in next row Sensitive      <=1 SENSITIVEThis is a modified FDA-approved test that has been validated and its performance characteristics determined by the reporting laboratory.  This laboratory is certified under the Clinical Laboratory Improvement Amendments CLIA as qualified to perform high complexity clinical laboratory testing.    AMPICILLIN/SULBACTAM Value in next row Sensitive      <=1 SENSITIVEThis is a modified FDA-approved test that has been validated and its performance characteristics determined by the reporting laboratory.  This laboratory is certified under the Clinical Laboratory Improvement Amendments CLIA as qualified to perform high complexity clinical laboratory testing.    PIP/TAZO Value in next row Sensitive      <=4 SENSITIVEThis is a modified FDA-approved test that has been validated and its performance characteristics determined by the reporting laboratory.  This laboratory is certified under the Clinical Laboratory Improvement Amendments CLIA as qualified to perform high complexity clinical laboratory testing.    MEROPENEM Value in next row Sensitive      <=4 SENSITIVEThis is a modified FDA-approved test that has been validated and its performance characteristics determined by the reporting laboratory.  This laboratory is certified under the Clinical Laboratory Improvement Amendments CLIA as qualified to perform high complexity clinical laboratory testing.    * 10,000 COLONIES/mL ESCHERICHIA COLI  CULTURE, URINE COMPREHENSIVE     Status: Abnormal   Collection Time: 07/20/24  9:19 AM   Specimen: Urine   UR  Result Value Ref Range Status    Urine Culture, Comprehensive Final report (A)  Final   Organism ID, Bacteria Comment (A)  Final    Comment: Escherichia coli, identified by an automated biochemical system. Multi-Drug Resistant Organism 2,000 Colonies/mL    ANTIMICROBIAL SUSCEPTIBILITY Comment  Final    Comment:       ** S = Susceptible; I = Intermediate; R = Resistant **                    P = Positive; N = Negative             MICS are expressed in micrograms per mL    Antibiotic                 RSLT#1    RSLT#2    RSLT#3    RSLT#4 Amoxicillin /Clavulanic Acid    R Ampicillin                     R Cefazolin                      R Cefepime   R Cefoxitin                      R Cefpodoxime                    R Ceftriaxone                     R Ciprofloxacin                   R Gentamicin                     S Levofloxacin                   R Meropenem                      S Nitrofurantoin                  S Piperacillin/Tazobactam        R Tetracycline                   R Tobramycin                     I Trimethoprim/Sulfa             R   Microscopic Examination     Status: Abnormal   Collection Time: 07/20/24  9:19 AM   Urine  Result Value Ref Range Status   WBC, UA >30 (A) 0 - 5 /hpf Final   RBC, Urine >30 (A) 0 - 2 /hpf Final   Epithelial Cells (non renal) 0-10 0 - 10 /hpf Final   Mucus, UA Present (A) Not Estab. Final   Bacteria, UA Moderate (A) None seen/Few Final  Culture, blood (Routine x 2)     Status: None (Preliminary result)   Collection Time: 08/01/24 12:30 PM   Specimen: BLOOD  Result Value Ref Range Status   Specimen Description   Final    BLOOD LEFT ANTECUBITAL Performed at [facility], [address], [place], [place] [address]    Special Requests   Final    BOTTLES DRAWN AEROBIC AND ANAEROBIC Blood Culture adequate volume Performed at [facility] [address], [place], [place] [address]    Culture   Final    NO GROWTH 3  DAYS Performed at [facility], [address] [place], [place] [address]    Report Status PENDING  Incomplete  Urine Culture     Status: Abnormal   Collection Time: 08/01/24 12:55 PM   Specimen: Urine, Random  Result Value Ref Range Status   Specimen Description   Final    URINE, RANDOM Performed at [facility], [address], [place], [place] [address]    Special Requests   Final    NONE Reflexed from [id number] Performed at [facility], [address], [place], [place] [address]    Culture 20,000 COLONIES/mL ESCHERICHIA COLI (A)  Final   Report Status 08/03/2024 FINAL  Final   Organism ID, Bacteria ESCHERICHIA COLI (A)  Final      Susceptibility   Escherichia coli - MIC*    AMPICILLIN >=32 RESISTANT Resistant     CEFAZOLIN (URINE) Value in next row Sensitive      4 SENSITIVEThis is a modified FDA-approved test that has been validated and its performance characteristics determined by the  reporting laboratory.  This laboratory is certified under the Clinical Laboratory Improvement Amendments CLIA as qualified to perform high complexity clinical laboratory testing.    CEFEPIME  Value in next row Sensitive      4 SENSITIVEThis is a modified FDA-approved test that has been validated and its performance characteristics determined by the reporting laboratory.  This laboratory is certified under the Clinical Laboratory Improvement Amendments CLIA as qualified to perform high complexity clinical laboratory testing.    ERTAPENEM Value in next row Sensitive      4 SENSITIVEThis is a modified FDA-approved test that has been validated and its performance characteristics determined by the reporting laboratory.  This laboratory is certified under the Clinical Laboratory Improvement Amendments CLIA as qualified to perform high complexity clinical laboratory testing.    CEFTRIAXONE  Value in next row Sensitive      4 SENSITIVEThis is a modified FDA-approved  test that has been validated and its performance characteristics determined by the reporting laboratory.  This laboratory is certified under the Clinical Laboratory Improvement Amendments CLIA as qualified to perform high complexity clinical laboratory testing.    CIPROFLOXACIN  Value in next row Resistant      4 SENSITIVEThis is a modified FDA-approved test that has been validated and its performance characteristics determined by the reporting laboratory.  This laboratory is certified under the Clinical Laboratory Improvement Amendments CLIA as qualified to perform high complexity clinical laboratory testing.    GENTAMICIN Value in next row Sensitive      4 SENSITIVEThis is a modified FDA-approved test that has been validated and its performance characteristics determined by the reporting laboratory.  This laboratory is certified under the Clinical Laboratory Improvement Amendments CLIA as qualified to perform high complexity clinical laboratory testing.    NITROFURANTOIN  Value in next row Sensitive      4 SENSITIVEThis is a modified FDA-approved test that has been validated and its performance characteristics determined by the reporting laboratory.  This laboratory is certified under the Clinical Laboratory Improvement Amendments CLIA as qualified to perform high complexity clinical laboratory testing.    TRIMETH/SULFA Value in next row Resistant      4 SENSITIVEThis is a modified FDA-approved test that has been validated and its performance characteristics determined by the reporting laboratory.  This laboratory is certified under the Clinical Laboratory Improvement Amendments CLIA as qualified to perform high complexity clinical laboratory testing.    AMPICILLIN/SULBACTAM Value in next row Intermediate      4 SENSITIVEThis is a modified FDA-approved test that has been validated and its performance characteristics determined by the reporting laboratory.  This laboratory is certified under the Clinical  Laboratory Improvement Amendments CLIA as qualified to perform high complexity clinical laboratory testing.    PIP/TAZO Value in next row Sensitive      <=4 SENSITIVEThis is a modified FDA-approved test that has been validated and its performance characteristics determined by the reporting laboratory.  This laboratory is certified under the Clinical Laboratory Improvement Amendments CLIA as qualified to perform high complexity clinical laboratory testing.    MEROPENEM Value in next row Sensitive      <=4 SENSITIVEThis is a modified FDA-approved test that has been validated and its performance characteristics determined by the reporting laboratory.  This laboratory is certified under the Clinical Laboratory Improvement Amendments CLIA as qualified to perform high complexity clinical laboratory testing.    * 20,000 COLONIES/mL ESCHERICHIA COLI  Culture, blood (Routine x 2)     Status: None (Preliminary result)   Collection  Time: 08/01/24  1:15 PM   Specimen: BLOOD LEFT WRIST  Result Value Ref Range Status   Specimen Description   Final    BLOOD LEFT WRIST Performed at [facility], [address], [place], [place] [address]    Special Requests   Final    BOTTLES DRAWN AEROBIC AND ANAEROBIC Blood Culture adequate volume Performed at [facility], [address], [place], [place] [address]    Culture   Final    NO GROWTH 3 DAYS Performed at [facility], [address], [place], [place] [address]    Report Status PENDING  Incomplete  Resp panel by RT-PCR (RSV, Flu A&B, Covid) Anterior Nasal Swab     Status: None   Collection Time: 08/01/24  1:40 PM   Specimen: Anterior Nasal Swab  Result Value Ref Range Status   SARS Coronavirus 2 by RT PCR NEGATIVE NEGATIVE Final    Comment: (NOTE) SARS-CoV-2 target nucleic acids are NOT DETECTED.  The SARS-CoV-2 RNA is generally detectable in upper respiratory specimens during the acute phase of infection. The  lowest concentration of SARS-CoV-2 viral copies this assay can detect is 138 copies/mL. A negative result does not preclude SARS-Cov-2 infection and should not be used as the sole basis for treatment or other patient management decisions. A negative result may occur with  improper specimen collection/handling, submission of specimen other than nasopharyngeal swab, presence of viral mutation(s) within the areas targeted by this assay, and inadequate number of viral copies(<138 copies/mL). A negative result must be combined with clinical observations, patient history, and epidemiological information. The expected result is Negative.  Fact Sheet for Patients:  bloggercourse.com  Fact Sheet for Healthcare Providers:  seriousbroker.it  This test is no t yet approved or cleared by the [place]  FDA and  has been authorized for detection and/or diagnosis of SARS-CoV-2 by FDA under an Emergency Use Authorization (EUA). This EUA will remain  in effect (meaning this test can be used) for the duration of the COVID-19 declaration under Section 564(b)(1) of the Act, 21 U.S.C.section 360bbb-3(b)(1), unless the authorization is terminated  or revoked sooner.       Influenza A by PCR NEGATIVE NEGATIVE Final   Influenza B by PCR NEGATIVE NEGATIVE Final    Comment: (NOTE) The Xpert Xpress SARS-CoV-2/FLU/RSV plus assay is intended as an aid in the diagnosis of influenza from Nasopharyngeal swab specimens and should not be used as a sole basis for treatment. Nasal washings and aspirates are unacceptable for Xpert Xpress SARS-CoV-2/FLU/RSV testing.  Fact Sheet for Patients: bloggercourse.com  Fact Sheet for Healthcare Providers: seriousbroker.it  This test is not yet approved or cleared by the [place]  FDA and has been authorized for detection and/or diagnosis of SARS-CoV-2 by FDA under  an Emergency Use Authorization (EUA). This EUA will remain in effect (meaning this test can be used) for the duration of the COVID-19 declaration under Section 564(b)(1) of the Act, 21 U.S.C. section 360bbb-3(b)(1), unless the authorization is terminated or revoked.     Resp Syncytial Virus by PCR NEGATIVE NEGATIVE Final    Comment: (NOTE) Fact Sheet for Patients: bloggercourse.com  Fact Sheet for Healthcare Providers: seriousbroker.it  This test is not yet approved or cleared by the [place]  FDA and has been authorized for detection and/or diagnosis of SARS-CoV-2 by FDA under an Emergency Use Authorization (EUA). This EUA will remain in effect (meaning this test can be used) for the duration of the COVID-19 declaration  under Section 564(b)(1) of the Act, 21 U.S.C. section 360bbb-3(b)(1), unless the authorization is terminated or revoked.  Performed at [facility], [address], [place], [place] [address]     [x]  No antibiotics needed  59 YOM with lithotripsy however developed fever with SOB/body aches and sent for evaluation to r/o urosepsis.The patient described URI symptoms such as cough, congestion, sore throat, and fevers - no urinary symptoms noted. UA did show pyuria but no bacteria however culture still yielded a low growth of 20k of E.coli - covered by the patient's low dose macrobid  for UTI prophylaxis.   No UTI treatment needed - can continue daily prophylactic macrobid  pending further follow-up with urology.   ED Provider: Larraine Christen, PA-C  Thank you for allowing pharmacy to be a part of this patients care.  Almarie Lunger, PharmD, BCPS, BCIDP Infectious Diseases Clinical Pharmacist 08/04/2024 8:59 AM   **Pharmacist phone directory can now be found on [email or web address] ([email or web address]).  Listed under [facility].

## 2024-08-04 NOTE — Telephone Encounter (Signed)
 Post ED Visit - Positive Culture Follow-up  Culture report reviewed by antimicrobial stewardship pharmacist: [facility] []  Rankin Dee, Pharm.D. []  Venetia Gully, Pharm.D., BCPS AQ-ID []  Garrel Crews, Pharm.D., BCPS []  Almarie Lunger, Pharm.D., BCPS []  [facility], [address].D., BCPS, AAHIVP []  Rosaline Bihari, Pharm.D., BCPS, AAHIVP []  Vernell Meier, PharmD, BCPS []  Latanya Hint, PharmD, BCPS []  Donald Medley, PharmD, BCPS []  Rocky Bold, PharmD []  Dorothyann Alert, PharmD, BCPS []  Morene Babe, PharmD  [facility] [x]  Almarie Lunger, PharmD []  Romona Bliss, PharmD []  Dolphus Roller, PharmD []  Veva Seip, Rph []  Vernell Daunt) Leonce, PharmD []  Eva Allis, PharmD []  Rosaline Millet, PharmD []  Iantha Batch, PharmD []  Arvin Gauss, PharmD []  Wanda Hasting, PharmD []  Ronal Rav, PharmD []  Rocky Slade, PharmD []  Bard Jeans, PharmD   Positive urine culture Pt on low dose macrobid  50mg  daily for prophylaxis--continue as prescribed; organism sensitive to the same.  No abx for UTI treatment needed at this time; no further patient follow-up is required at this time.  Lorita Barnie Pereyra 08/04/2024, 10:32 AM

## 2024-08-05 ENCOUNTER — Other Ambulatory Visit: Payer: Self-pay

## 2024-08-05 DIAGNOSIS — N201 Calculus of ureter: Secondary | ICD-10-CM

## 2024-08-05 NOTE — Progress Notes (Signed)
 [facility] Posting Form   Surgery Date: Date: 08/22/2024   Surgeon: Dr. Redell Burnet, MD   Inpt ( No  )   Outpt (Yes)   Obs ( No  )    Diagnosis: N20.1 Right Ureteral Stone, N20.1 Left Ureteral Stone   -CPT: [phone]   Surgery: Bilateral Ureteroscopy with Laser Lithotripsy and Stent Exchange   Stop Anticoagulations: No, may continue all    Cardiac/Medical/Pulmonary Clearance needed: no   *Orders entered into EPIC  Date: 08/05/2024   *Case booked in [place]  Date: 08/05/2024   *Notified pt of Surgery: Date: 08/05/2024   PRE-OP UA & CX: yes, obtained while in ED on 08/01/2024   *Placed into Prior Authorization Work [place] Date: 08/05/2024   Assistant/laser/rep:No

## 2024-08-06 LAB — CULTURE, BLOOD (ROUTINE X 2)
Culture: NO GROWTH
Culture: NO GROWTH
Special Requests: ADEQUATE
Special Requests: ADEQUATE

## 2024-08-15 ENCOUNTER — Other Ambulatory Visit: Payer: Self-pay | Admitting: Urology

## 2024-08-16 ENCOUNTER — Ambulatory Visit: Payer: Self-pay

## 2024-08-16 ENCOUNTER — Telehealth: Payer: Self-pay

## 2024-08-16 DIAGNOSIS — N200 Calculus of kidney: Secondary | ICD-10-CM

## 2024-08-16 MED ORDER — OXYBUTYNIN CHLORIDE ER 10 MG PO TB24: 10.0000 mg | ORAL_TABLET | Freq: Every day | ORAL | 0 refills | Status: AC

## 2024-08-16 MED ORDER — NITROFURANTOIN MACROCRYSTAL 50 MG PO CAPS: 50.0000 mg | ORAL_CAPSULE | Freq: Every day | ORAL | 0 refills | Status: AC

## 2024-08-16 NOTE — Telephone Encounter (Signed)
 Left message returning patient's call, had to leave message on vm.

## 2024-08-16 NOTE — Telephone Encounter (Signed)
 RX sent in

## 2024-08-16 NOTE — Telephone Encounter (Signed)
 Patient called asking for additional days of the low dose macrobid , I can't seem to populate this to send it in for him. Can you send him additional refills? Surgery is scheduled for 08/22/24.  Requested additional refill for Oxybutynin  and I did go ahead and send that one.

## 2024-08-17 ENCOUNTER — Ambulatory Visit: Admitting: Medical

## 2024-08-17 VITALS — Wt >= 6400 oz

## 2024-08-17 DIAGNOSIS — H6690 Otitis media, unspecified, unspecified ear: Secondary | ICD-10-CM

## 2024-08-17 DIAGNOSIS — I959 Hypotension, unspecified: Secondary | ICD-10-CM | POA: Diagnosis not present

## 2024-08-17 HISTORY — DX: Otitis media, unspecified, unspecified ear: H66.90

## 2024-08-17 LAB — POCT HEMOGLOBIN: Hemoglobin: 12.6 g/dL (ref 11–14.6)

## 2024-08-17 LAB — GLUCOSE, POCT (MANUAL RESULT ENTRY): POC Glucose: 162 mg/dL — AB (ref 70–99)

## 2024-08-17 MED ORDER — GLIMEPIRIDE 1 MG PO TABS: 1.0000 mg | ORAL_TABLET | Freq: Every day | ORAL | 0 refills | Status: AC

## 2024-08-17 MED ORDER — MIDODRINE HCL 2.5 MG PO TABS: 2.5000 mg | ORAL_TABLET | Freq: Two times a day (BID) | ORAL | 0 refills | Status: AC

## 2024-08-17 NOTE — Progress Notes (Signed)
 "  Name: Patrick Baxter   Date of Visit: 08/17/24   CHIEF COMPLAINT:  Chief Complaint  Patient presents with   Acute Visit    Blurred vision - he sees stars, hard to the computer, bright lights affects is causing him issues. Weakness.  Urinating is all blood- suppose to have surgery on 1/19       HPI:  Discussed the use of AI scribe software for clinical note transcription with the patient, who gave verbal consent to proceed.  History of Present Illness    Patrick Baxter [name] is a 54 year old male who presents with lightheadedness and hematuria since his most recent visit to the hospital.  He has had several hospital visits recently due to infection, hypotension, lightheadedness.  He has been experiencing lightheadedness and a general feeling of being unwell, with recent blood work indicating lower blood counts. He has noticed hematuria for the past two to three weeks. No rectal bleeding is present.  A procedure initially scheduled for the 19th was postponed due to an upper respiratory infection characterized by a severe cough, which led to an ER visit. The procedure has been rescheduled for the coming Monday, and his cough has since improved.  His current medications include Tylenol  and albuterol  as needed, allopurinol  300 mg daily, colchicine  as needed, vitamin D , multivitamins, omega-3, CoQ10, fluoxetine  40 mg, gabapentin 300 mg at bedtime, Singulair , levothyroxine  25 mcg, Flomax , and oxybutynin  (which has run out). He has completed a course of Keflex  for his kidney infection and is prescribed Macrobid  in preparation for the upcoming procedure. He has not been taking Rybelsus .  He is concerned about his employment status due to frequent absences related to health issues, including a previous COVID-19 infection and double pneumonia. He is considering temporary disability as an option to manage his health and employment situation.  He reports that his blood pressure readings at home may  be inaccurate due to the use of a wrist cuff, with a reading of 144/95 that he believes may not be accurate. He has not been monitoring his blood sugar regularly, but he has a [employer] device available for use. He recalls a recent blood sugar reading of 162 after eating a snack.  No other aggravating or relieving factors. No other complaint.  Past Medical History:  Diagnosis Date   Allergy    Anxiety    Back pain    Bilateral chronic knee pain    Chronic headaches    Depression    Dermatographism    Dry skin    Fatigue    Frequent urination    Gout    Hx of echocardiogram 09/07/2019   normal LV function, EF 60-65%, no wall motion abnormality; Dr. Michele Richardson   Hyperlipidemia    Hypertension    Joint pain    Knee pain    Obesity    Pre-diabetes    Sleep apnea    Stress    Wears glasses    Current Outpatient Medications on File Prior to Visit  Medication Sig Dispense Refill   acetaminophen  (TYLENOL ) 500 MG tablet Take 1,000 mg by mouth every 6 (six) hours as needed for mild pain (pain score 1-3) or fever.     acetic acid -hydrocortisone  (VOSOL -HC) OTIC solution Place 3 drops into the left ear 4 (four) times daily. 10 mL 3   albuterol  (VENTOLIN  HFA) 108 (90 Base) MCG/ACT inhaler Inhale 2 puffs into the lungs every 6 (six) hours as needed for wheezing or shortness  of breath. 8 g 2   allopurinol  (ZYLOPRIM ) 300 MG tablet TAKE 1 TABLET(300 MG) BY MOUTH DAILY 90 tablet 1   cetirizine (ZYRTEC) 10 MG tablet Take 10 mg by mouth daily as needed for allergies or rhinitis.     cholecalciferol (VITAMIN D3) 25 MCG (1000 UNIT) tablet Take 1 tablet (1,000 Units total) by mouth daily. 90 tablet 3   clotrimazole -betamethasone  (LOTRISONE ) cream Apply 1 Application topically daily. 30 g 0   colchicine  0.6 MG tablet Take 1 tablet (0.6 mg total) by mouth daily as needed (for gout).     cyclobenzaprine  (FLEXERIL ) 10 MG tablet Take 1 tablet (10 mg total) by mouth 3 (three) times daily as needed  for muscle spasms. 30 tablet 3   FLUoxetine  (PROZAC ) 40 MG capsule TAKE 1 CAPSULE(40 MG) BY MOUTH DAILY 90 capsule 1   gabapentin (NEURONTIN) 300 MG capsule Take 300 mg by mouth at bedtime.     levothyroxine  (SYNTHROID ) 25 MCG tablet TAKE 1 TABLET BY MOUTH EVERY DAY BEFORE BREAKFAST 90 tablet 3   lisinopril  (ZESTRIL ) 20 MG tablet TAKE 1 TABLET(20 MG) BY MOUTH DAILY 90 tablet 1   montelukast  (SINGULAIR ) 10 MG tablet TAKE 1 TABLET(10 MG) BY MOUTH AT BEDTIME 90 tablet 3   Multiple Vitamins-Minerals (MULTIVITAMIN WITH MINERALS) tablet Take 1 tablet by mouth daily. 90 tablet 3   nitrofurantoin  (MACRODANTIN ) 50 MG capsule Take 1 capsule (50 mg total) by mouth at bedtime. 7 capsule 0   oxybutynin  (DITROPAN -XL) 10 MG 24 hr tablet Take 1 tablet (10 mg total) by mouth daily. 30 tablet 0   sodium chloride  (OCEAN) 0.65 % SOLN nasal spray Place 1 spray into both nostrils as needed for congestion.     tamsulosin  (FLOMAX ) 0.4 MG CAPS capsule Take 0.4 mg by mouth daily.     RYBELSUS  14 MG TABS Take 14 mg by mouth in the morning. (Patient not taking: Reported on 08/17/2024)     No current facility-administered medications on file prior to visit.      OBJECTIVE:    Wt (!) 402 lb 9.6 oz (182.6 kg)   SpO2 98%   BMI 61.22 kg/m   Wt (!) 402 lb 9.6 oz (182.6 kg)   SpO2 98%   BMI 61.22 kg/m    Wt Readings from Last 3 Encounters:  08/17/24 (!) 402 lb 9.6 oz (182.6 kg)  07/29/24 (!) 404 lb 3.2 oz (183.3 kg)  07/25/24 (!) 400 lb (181.4 kg)   BP Readings from Last 3 Encounters:  08/01/24 (!) 123/57  07/29/24 (!) 160/74  07/11/24 137/66    General appearence: alert, no distress, WD/WN,  No pallor of conjunctiva Oral cavity: MMM, no lesions Neck: supple, no lymphadenopathy, no thyromegaly, no masses, no JVD Heart: RRR, normal S1, S2, no murmurs Lungs: CTA bilaterally, no wheezes, rhonchi, or rales Extremities: no edema, no cyanosis, no clubbing Pulses: 2+ symmetric, upper and lower extremities,  normal cap refill    ASSESSMENT/PLAN:   Encounter Diagnosis  Name Primary?   Hypotension, unspecified hypotension type Yes   I reviewed his recent August 01, 2024 emergency department visit notes  Unfortunately he has had back-to-back visits to the emergency department and hospitalization given recent sepsis, kidney stones, and other comorbidities  Today he is still hypotensive and apparently was still taking his blood pressure pill lisinopril  even since last hospital visit  We will make the following changes today:   Recommendations: Temporarily stop Lisinopril  20 mg blood pressure pill Stop Rybelsus  diabetes pill (hasn't  taken this since a month ago) Begin midodrine  2.5 mg twice daily for the next 3 to 5 days Monitor your blood pressures at home if possible.  If you feel significantly improved by Sunday then maybe hold off on the midodrine .  This is a temporary measure to help increase your blood pressures.  Keep in mind stopping the lisinopril  will also work to increase your blood pressure since it is too low currently After 3 to 5 days if you are feeling much better then we will hold off on the midodrine  and just use as needed for low pressure readings Over the next month if your blood pressure readings creep back up over 140/90 then we will resume lisinopril  Use the freestyle libre to monitor your blood sugars particularly fasting and before breakfast and dinner and lunch If your blood sugar is less than 150 fasting before breakfast lunch or dinner or first in the morning then we will not use any diabetes medicine at this time However, if your blood pressures are greater than 150 fasting in the morning or before lunch or dinner then I will have you use a medicine called glimepiride  1 tablet daily Check into your short-term disability benefits at work and get me paperwork if needed Drink 80-100 ounces of water  daily Call or recheck by next Wednesday for updates on blood pressure,  sugar, overall how you feel    Mir Fullilove was seen today for acute visit.  Diagnoses and all orders for this visit:  Hypotension, unspecified hypotension type -     POCT glucose (manual entry) -     POCT hemoglobin    F/u next week  [facility] "

## 2024-08-17 NOTE — Patient Instructions (Signed)
 Recommendations: Temporarily stop Lisinopril  20 mg blood pressure pill Stop Rybelsus  diabetes pill (hasn't taken this since a month ago) Begin midodrine  2.5 mg twice daily for the next 3 to 5 days Monitor your blood pressures at home if possible.  If you feel significantly improved by Sunday then maybe hold off on the midodrine .  This is a temporary measure to help increase your blood pressures.  Keep in mind stopping the lisinopril  will also work to increase your blood pressure since it is too low currently After 3 to 5 days if you are feeling much better then we will hold off on the midodrine  and just use as needed for low pressure readings Over the next month if your blood pressure readings creep back up over 140/90 then we will resume lisinopril  Use the freestyle libre to monitor your blood sugars particularly fasting and before breakfast and dinner and lunch If your blood sugar is less than 150 fasting before breakfast lunch or dinner or first in the morning then we will not use any diabetes medicine at this time However, if your blood pressures are greater than 150 fasting in the morning or before lunch or dinner then I will have you use a medicine called glimepiride  1 tablet daily Check into your short-term disability benefits at work and get me paperwork if needed Drink 80-100 ounces of water  daily Call or recheck by next Wednesday for updates on blood pressure, sugar, overall how you feel

## 2024-08-19 ENCOUNTER — Inpatient Hospital Stay: Admission: RE | Admit: 2024-08-19

## 2024-08-19 HISTORY — DX: Obstructive sleep apnea (adult) (pediatric): G47.33

## 2024-08-19 HISTORY — DX: Hypotension, unspecified: I95.9

## 2024-08-19 NOTE — Patient Instructions (Addendum)
 Your procedure is scheduled on:08-22-24 Monday Report to the Registration Desk on the 1st floor of the [facility].Then proceed to the 2nd floor Surgery Desk Arrive at 7:30 AM If your arrival time is 6:00 am, do not arrive before that time as the [facility] entrance doors do not open until 6:00 am.  REMEMBER: Instructions that are not followed completely may result in serious medical risk, up to and including death; or upon the discretion of your surgeon and anesthesiologist your surgery may need to be rescheduled.  Do not eat food OR drink liquids after midnight the night before surgery.  No gum chewing or hard candies.  One week prior to surgery:Stop NOW (08-19-24) Stop Anti-inflammatories (NSAIDS) such as Advil , Aleve , Ibuprofen , Motrin , Naproxen , Naprosyn  and Aspirin based products such as Excedrin, Goody's Powder, BC Powder. Stop ANY OVER THE COUNTER supplements until after surgery (Vitamin D )  You may however, continue to take Tylenol  if needed for pain up until the day of surgery.  Continue taking all of your other prescription medications up until the day of surgery.  ON THE DAY OF SURGERY ONLY TAKE THESE MEDICATIONS WITH SIPS OF WATER : -levothyroxine  (SYNTHROID )  -midodrine  (PROAMATINE )  -FLUoxetine  (PROZAC )   Bring your C-Pap machine to the hospital  No Alcohol for 24 hours before or after surgery.  No Smoking including e-cigarettes for 24 hours before surgery.  No chewable tobacco products for at least 6 hours before surgery.  No nicotine patches on the day of surgery.  Do not use any recreational drugs for at least a week (preferably 2 weeks) before your surgery.  Please be advised that the combination of cocaine and anesthesia may have negative outcomes, up to and including death. If you test positive for cocaine, your surgery will be cancelled.  On the morning of surgery brush your teeth with toothpaste and water , you may rinse your mouth with mouthwash if you  wish. Do not swallow any toothpaste or mouthwash.  Do not wear jewelry, make-up, hairpins, clips or nail polish.  For welded (permanent) jewelry: bracelets, anklets, waist bands, etc.  Please have this removed prior to surgery.  If it is not removed, there is a chance that hospital personnel will need to cut it off on the day of surgery.  Do not wear lotions, powders, or perfumes.   Do not shave body hair from the neck down 48 hours before surgery.  Contact lenses, hearing aids and dentures may not be worn into surgery.  Do not bring valuables to the hospital. [facility] is not responsible for any missing/lost belongings or valuables.   Notify your doctor if there is any change in your medical condition (cold, fever, infection).  Wear comfortable clothing (specific to your surgery type) to the hospital.  After surgery, you can help prevent lung complications by doing breathing exercises.  Take deep breaths and cough every 1-2 hours. Your doctor may order a device called an Incentive Spirometer to help you take deep breaths. When coughing or sneezing, hold a pillow firmly against your incision with both hands. This is called splinting. Doing this helps protect your incision. It also decreases belly discomfort.  If you are being admitted to the hospital overnight, leave your suitcase in the car. After surgery it may be brought to your room.  In case of increased patient census, it may be necessary for you, the patient, to continue your postoperative care in the Same Day Surgery department.  If you are being discharged the day  of surgery, you will not be allowed to drive home. You will need a responsible individual to drive you home and stay with you for 24 hours after surgery.   If you are taking public transportation, you will need to have a responsible individual with you.  Please call the Pre-admissions Testing Dept. at [phone] if you have any questions about these  instructions.  Surgery Visitation Policy:  Patients having surgery or a procedure may have two visitors.  Children under the age of 10 must have an adult with them who is not the patient.   [employer] to address health-related social needs:  [email or web address]

## 2024-08-22 ENCOUNTER — Encounter: Payer: Self-pay | Admitting: Urgent Care

## 2024-08-22 ENCOUNTER — Encounter: Payer: Self-pay | Admitting: Certified Registered"

## 2024-08-22 ENCOUNTER — Ambulatory Visit: Admission: RE | Admit: 2024-08-22 | Source: Home / Self Care | Admitting: Urology

## 2024-08-22 ENCOUNTER — Encounter: Admission: RE | Payer: Self-pay | Source: Home / Self Care

## 2024-08-22 DIAGNOSIS — E1165 Type 2 diabetes mellitus with hyperglycemia: Secondary | ICD-10-CM

## 2024-12-16 ENCOUNTER — Encounter: Admitting: Medical
